# Patient Record
Sex: Female | Born: 1940 | Race: White | Hispanic: No | Marital: Married | Smoking: Never smoker
Health system: Southern US, Community
[De-identification: ages and names within clinical notes are randomized; demographics above are authoritative.]

## PROBLEM LIST (undated history)

## (undated) DIAGNOSIS — I10 Essential (primary) hypertension: Secondary | ICD-10-CM

## (undated) DIAGNOSIS — E785 Hyperlipidemia, unspecified: Secondary | ICD-10-CM

## (undated) DIAGNOSIS — L570 Actinic keratosis: Secondary | ICD-10-CM

## (undated) HISTORY — PX: RETINAL DETACHMENT SURGERY: SHX105

## (undated) HISTORY — DX: Actinic keratosis: L57.0

## (undated) HISTORY — PX: CATARACT EXTRACTION: SUR2

---

## 1997-10-06 ENCOUNTER — Other Ambulatory Visit: Admission: RE | Admit: 1997-10-06 | Discharge: 1997-10-06 | Payer: Self-pay | Admitting: Obstetrics & Gynecology

## 1998-10-07 ENCOUNTER — Other Ambulatory Visit: Admission: RE | Admit: 1998-10-07 | Discharge: 1998-10-07 | Payer: Self-pay | Admitting: Obstetrics & Gynecology

## 1999-10-08 ENCOUNTER — Other Ambulatory Visit: Admission: RE | Admit: 1999-10-08 | Discharge: 1999-10-08 | Payer: Self-pay | Admitting: Family Medicine

## 2000-10-13 ENCOUNTER — Other Ambulatory Visit: Admission: RE | Admit: 2000-10-13 | Discharge: 2000-10-13 | Payer: Self-pay | Admitting: Obstetrics & Gynecology

## 2001-10-17 ENCOUNTER — Other Ambulatory Visit: Admission: RE | Admit: 2001-10-17 | Discharge: 2001-10-17 | Payer: Self-pay | Admitting: Obstetrics & Gynecology

## 2002-10-22 ENCOUNTER — Other Ambulatory Visit: Admission: RE | Admit: 2002-10-22 | Discharge: 2002-10-22 | Payer: Self-pay | Admitting: Obstetrics & Gynecology

## 2003-10-27 ENCOUNTER — Other Ambulatory Visit: Admission: RE | Admit: 2003-10-27 | Discharge: 2003-10-27 | Payer: Self-pay | Admitting: Obstetrics & Gynecology

## 2004-11-01 ENCOUNTER — Other Ambulatory Visit: Admission: RE | Admit: 2004-11-01 | Discharge: 2004-11-01 | Payer: Self-pay | Admitting: Obstetrics & Gynecology

## 2006-01-25 ENCOUNTER — Ambulatory Visit: Payer: Self-pay | Admitting: Family Medicine

## 2006-02-02 ENCOUNTER — Ambulatory Visit: Payer: Self-pay | Admitting: Internal Medicine

## 2006-02-03 ENCOUNTER — Ambulatory Visit: Payer: Self-pay | Admitting: Family Medicine

## 2006-12-27 ENCOUNTER — Ambulatory Visit: Payer: Self-pay | Admitting: Internal Medicine

## 2007-01-10 ENCOUNTER — Ambulatory Visit: Payer: Self-pay | Admitting: Internal Medicine

## 2007-01-10 ENCOUNTER — Encounter: Payer: Self-pay | Admitting: Family Medicine

## 2007-01-10 LAB — HM COLONOSCOPY: HM Colonoscopy: NORMAL

## 2007-01-18 ENCOUNTER — Encounter: Payer: Self-pay | Admitting: Family Medicine

## 2009-03-09 ENCOUNTER — Encounter: Payer: Self-pay | Admitting: Family Medicine

## 2009-03-09 LAB — CONVERTED CEMR LAB
Pap Smear: NORMAL
Pap Smear: NORMAL
Pap Smear: NORMAL
Pap Smear: NORMAL

## 2009-03-09 LAB — HM PAP SMEAR

## 2009-03-09 LAB — HM MAMMOGRAPHY: HM Mammogram: NORMAL

## 2009-03-19 ENCOUNTER — Encounter: Payer: Self-pay | Admitting: Family Medicine

## 2009-03-19 LAB — CONVERTED CEMR LAB: Triglyceride fasting, serum: 123 mg/dL

## 2009-03-27 ENCOUNTER — Ambulatory Visit: Payer: Self-pay | Admitting: Family Medicine

## 2009-03-27 DIAGNOSIS — E7800 Pure hypercholesterolemia, unspecified: Secondary | ICD-10-CM | POA: Insufficient documentation

## 2009-03-27 DIAGNOSIS — E785 Hyperlipidemia, unspecified: Secondary | ICD-10-CM

## 2009-03-27 DIAGNOSIS — J309 Allergic rhinitis, unspecified: Secondary | ICD-10-CM

## 2009-03-27 HISTORY — DX: Hyperlipidemia, unspecified: E78.5

## 2009-06-26 ENCOUNTER — Ambulatory Visit: Payer: Self-pay | Admitting: Family Medicine

## 2009-06-29 LAB — CONVERTED CEMR LAB
ALT: 24 units/L (ref 0–35)
AST: 27 units/L (ref 0–37)
Bilirubin, Direct: 0.2 mg/dL (ref 0.0–0.3)
Calcium: 9.2 mg/dL (ref 8.4–10.5)
Cholesterol: 264 mg/dL — ABNORMAL HIGH (ref 0–200)
Glucose, Bld: 90 mg/dL (ref 70–99)
HDL: 41 mg/dL (ref >39)
Potassium: 4.2 meq/L (ref 3.5–5.3)
Sodium: 141 meq/L (ref 135–145)
Total CHOL/HDL Ratio: 6.4
Total Protein: 7.5 g/dL (ref 6.0–8.3)
Triglycerides: 95 mg/dL (ref <150)
VLDL: 19 mg/dL (ref 0–40)

## 2010-05-28 ENCOUNTER — Emergency Department: Payer: Self-pay | Admitting: Emergency Medicine

## 2010-10-29 NOTE — Assessment & Plan Note (Signed)
[facility]                             [facility] OFFICE NOTE   NAME:Patty Shelton                      MRN:          [id number]  DATE:01/25/2006                            DOB:          09-27-1940    CHIEF COMPLAINT:  A 70 year old white female here to establish a new doctor.   HISTORY OF PRESENT ILLNESS:  Patty Shelton has been very healthy over  the past few years.  She has not seen a physician for anything other than a  gynecologist in many years.  She is here today to have a physical exam and  to be set up for preventative medicine.   PAST MEDICAL HISTORY:  1. Allergic rhinitis.  2. Stress incontinence.  3. Genital herpes.   HOSPITALIZATIONS/SURGERIES/PROCEDURES:  1. Cataract surgery in 2006.  2. Retinal detachment twice, once in 1998 and once in 2006.  3. Mammogram.  Normal in May, 2007.  4. Pap smear, normal, in May, 2007.   REVIEW OF SYSTEMS:  She denies headache, dizziness, syncope.  She does wear  glasses.  She does report some mild decrease in hearing bilaterally over the  past few years, but it is not enough to bother her.  It only causes problems  in environments with lots of background noise.  She denies dyspnea,  palpitations, chest pain, wheeze.  No nausea, vomiting, diarrhea,  constipation, or rectal bleeding.  She denies nocturia, temperature  intolerance, muscle aches and pains.  She does report some chronic left hip  pain that bothers her intermittently.  Patient also notes that her  gynecologist has measured high-normal blood pressures several times in the  past few years.   FAMILY HISTORY:  Father deceased at age 60 secondary to coronary artery  disease.  Mother alive at age 77 with hypertension, cholesterol, and  coronary artery disease.  Grandparents:  Maternal grandfather with MI,  coronary artery disease, and arthritis, otherwise healthy grandparents.  No  brothers and sisters.  No family history of  breast, ovarian, uterine, or  colon cancer.  No family history of alcohol or drug abuse or depression.   SOCIAL HISTORY:  She currently works as a Investment banker, corporate.  She has been  married for 47 years.  She walks about a quarter mile daily while walking  the dog but does not get any routine exercise.  She has two children, who  are age 98 and 9, and one of them has bipolar disorder.  Her diet, she has  three meals daily and sometimes tries to have snacks at least about every  two hours, but she does not feel hunger.  She denies eating any fried foods  but does frequently eat sweets.  She does get a lot of water in, as far as  intake.  No tobacco history.  No alcohol use.  No IV drug use.   PHYSICAL EXAMINATION:  VITAL SIGNS:  Height 66-1/4.  Weight 166, making BMI  27.  Blood pressure 132/86, pulse 88, temperature 98.1.  GENERAL:  Healthy-appearing female in no apparent distress.  HEENT:  PERRLA.  No  papilledema.  No hypertensive changes in retinas.  Nares  clear.  Tympanic membranes clear.  Oropharynx clear.  NECK:  No thyromegaly.  No lymphadenopathy, cervical or supraclavicular.  LUNGS:  Clear to auscultation to light bilaterally.  No wheezes, rales, or  rhonchi.  CARDIOVASCULAR:  Regular rate and rhythm.  No murmurs, rubs or gallops.  Pulses 2+ radial.  ABDOMEN:  Soft and nontender.  Normoactive bowel sounds.  No  hepatosplenomegaly.  MUSCULOSKELETAL:  Strength is 5/5 in the upper and lower extremities.  Normal range of motion, bilateral shoulders, hips, knees, and ankles.  EXTREMITIES:  No cyanosis.  No clubbing.  NEUROLOGIC:  Alert and oriented x3.  Cranial nerves II-XII are grossly  intact.  Sensation to light touch intact in upper and lower extremities.   ASSESSMENT/PLAN:  Today we focused on prevention.  Patty Shelton will return to  clinic for lab testing to evaluate kidney function and to screen for  hypercholesterolemia, given her history of high-normal blood pressures  We   will also obtain baseline liver function tests because of possibility of  starting high cholesterol medication.  We will also screen her for diabetes.  She was scheduled for a colonoscopy and a bone density scan today.  We  discussed low salt diet, increasing fiber, and getting regular exercise.  She was also given information to read about these topics.  It is  recommended to take a multivitamin daily and get calcium and vitamin D  daily.  She will return to clinic as needed following labs.                                   Kerby Nora, MD   AB/MedQ  DD:  01/25/2006  DT:  01/25/2006  Job #:  [id number]

## 2011-02-03 ENCOUNTER — Encounter: Payer: Self-pay | Admitting: Family Medicine

## 2011-02-11 ENCOUNTER — Ambulatory Visit (INDEPENDENT_AMBULATORY_CARE_PROVIDER_SITE_OTHER): Payer: Medicare Other | Admitting: Family Medicine

## 2011-02-11 ENCOUNTER — Encounter: Payer: Self-pay | Admitting: Family Medicine

## 2011-02-11 VITALS — BP 140/82 | HR 77 | Temp 98.1°F | Ht 66.5 in | Wt 170.8 lb

## 2011-02-11 DIAGNOSIS — M79609 Pain in unspecified limb: Secondary | ICD-10-CM

## 2011-02-11 DIAGNOSIS — I10 Essential (primary) hypertension: Secondary | ICD-10-CM | POA: Insufficient documentation

## 2011-02-11 DIAGNOSIS — E7800 Pure hypercholesterolemia, unspecified: Secondary | ICD-10-CM

## 2011-02-11 DIAGNOSIS — M79604 Pain in right leg: Secondary | ICD-10-CM | POA: Insufficient documentation

## 2011-02-11 LAB — POCT URINALYSIS DIPSTICK
Bilirubin, UA: NEGATIVE
Blood, UA: NEGATIVE
Glucose, UA: NEGATIVE
Nitrite, UA: NEGATIVE
Spec Grav, UA: 1.01
pH, UA: 7

## 2011-02-11 NOTE — Assessment & Plan Note (Signed)
Due for re-eval. Likely will need chol medicaiton. She will start on low chol diet and continue exercsie.

## 2011-02-11 NOTE — Assessment & Plan Note (Addendum)
New diagnosis.  EKG: NSR, no ST changes, no Q, no LVH. UA showed no protein. Eval with labs for secondary cause, end organ damage and risk factors for CAD.  Likely will start HCTZ next appt.

## 2011-02-11 NOTE — Progress Notes (Signed)
Subjective:    Patient ID: Patty Shelton, female    DOB: 04-05-1941, 70 y.o.   MRN: [id number]  HPI  70 year old female last seen in 2010 presents for BP check and lab discussion.   She has a history of high cholesterol, not on any medication. She has not made very many lifestyle changes since last chol check in 2011. Husband had heart attack and DM diagnosis  6 weeks ago.Marland Kitchen Has started some changes since then.  Has started walking 1/2 a mile twice a day.  LDL goal is <130. Lab Results  Component Value Date   CHOL 264* 06/26/2009   CHOL 270 03/19/2009   Lab Results  Component Value Date   HDL 41 06/26/2009   HDL 49 78/07/9560   Lab Results  Component Value Date   LDLCALC 204* 06/26/2009   LDLCALC 196 03/19/2009   Lab Results  Component Value Date   TRIG 95 06/26/2009   In 2-3 days she has been having mild  left anterior leg pain, occ wakes her up at night. Ache.  Worse when lying down at night, feels better when moving. Resolves with tylenol.  She does see GYN regularly. She has uterus and ovaries...has continued pap smears and breast exam.  Last mammo  01/2010  New diagnosis of HTN: At home 140/? several times.   Review of Systems  Constitutional: Negative for fever and fatigue.  HENT: Negative for ear pain.   Eyes: Negative for pain.  Respiratory: Negative for cough, chest tightness and shortness of breath.        She has noted some mild SOB with walking.. She has recently started exercise routine.  Cardiovascular: Negative for chest pain, palpitations and leg swelling.  Gastrointestinal: Negative for abdominal pain, diarrhea, constipation and blood in stool.  Genitourinary: Negative for dysuria and difficulty urinating.       Objective:   Physical Exam  Constitutional: Vital signs are normal. She appears well-developed and well-nourished. She is cooperative.  Non-toxic appearance. She does not appear ill. No distress.  HENT:  Head: Normocephalic.  Right Ear:  Hearing, tympanic membrane, external ear and ear canal normal. Tympanic membrane is not erythematous, not retracted and not bulging.  Left Ear: Hearing, tympanic membrane, external ear and ear canal normal. Tympanic membrane is not erythematous, not retracted and not bulging.  Nose: No mucosal edema or rhinorrhea. Right sinus exhibits no maxillary sinus tenderness and no frontal sinus tenderness. Left sinus exhibits no maxillary sinus tenderness and no frontal sinus tenderness.  Mouth/Throat: Uvula is midline, oropharynx is clear and moist and mucous membranes are normal.  Eyes: Conjunctivae, EOM and lids are normal. Pupils are equal, round, and reactive to light. No foreign bodies found.  Fundoscopic exam:      The right eye shows no AV nicking, no exudate, no hemorrhage and no papilledema.       The left eye shows no AV nicking, no exudate, no hemorrhage and no papilledema.  Neck: Trachea normal and normal range of motion. Neck supple. Carotid bruit is not present. No mass and no thyromegaly present.  Cardiovascular: Normal rate, regular rhythm, S1 normal, S2 normal, normal heart sounds, intact distal pulses and normal pulses.  Exam reveals no gallop and no friction rub.   No murmur heard. Pulmonary/Chest: Effort normal and breath sounds normal. Not tachypneic. No respiratory distress. She has no decreased breath sounds. She has no wheezes. She has no rhonchi. She has no rales.  Abdominal: Soft. Normal appearance  and bowel sounds are normal. There is no tenderness.  Neurological: She is alert.  Skin: Skin is warm, dry and intact. No rash noted.  Psychiatric: Her speech is normal and behavior is normal. Judgment and thought content normal. Her mood appears not anxious. Cognition and memory are normal. She does not exhibit a depressed mood.          Assessment & Plan:

## 2011-02-11 NOTE — Patient Instructions (Addendum)
Work on healthy eating habits, increase exercise, weight loss. Increase fluids, stretch before and after exercise.  We will discuss lab results at next appointment.

## 2011-02-11 NOTE — Assessment & Plan Note (Signed)
No sign of DVT or PVD. Most likely MSK strain for recent activity change and no stretching. Will eval TSH and K. Increase fluids, stretch before and after exercise.

## 2011-03-07 ENCOUNTER — Other Ambulatory Visit (INDEPENDENT_AMBULATORY_CARE_PROVIDER_SITE_OTHER): Payer: Medicare Other

## 2011-03-07 DIAGNOSIS — I10 Essential (primary) hypertension: Secondary | ICD-10-CM

## 2011-03-07 LAB — CBC WITH DIFFERENTIAL/PLATELET
Basophils Absolute: 0 10*3/uL (ref 0.0–0.1)
Eosinophils Absolute: 0.1 10*3/uL (ref 0.0–0.7)
Eosinophils Relative: 2.6 % (ref 0.0–5.0)
HCT: 42.5 % (ref 36.0–46.0)
Lymphs Abs: 1.9 10*3/uL (ref 0.7–4.0)
MCHC: 33.5 g/dL (ref 30.0–36.0)
MCV: 88.9 fl (ref 78.0–100.0)
Monocytes Absolute: 0.4 10*3/uL (ref 0.1–1.0)
Neutrophils Relative %: 57.7 % (ref 43.0–77.0)
Platelets: 258 10*3/uL (ref 150.0–400.0)
RDW: 13.2 % (ref 11.5–14.6)

## 2011-03-07 LAB — LIPID PANEL
Cholesterol: 230 mg/dL — ABNORMAL HIGH (ref 0–200)
HDL: 42.6 mg/dL (ref >39.00)
Triglycerides: 180 mg/dL — ABNORMAL HIGH (ref 0.0–149.0)

## 2011-03-07 LAB — COMPREHENSIVE METABOLIC PANEL
Albumin: 4.1 g/dL (ref 3.5–5.2)
BUN: 19 mg/dL (ref 6–23)
Calcium: 8.7 mg/dL (ref 8.4–10.5)
Chloride: 107 mEq/L (ref 96–112)
Creatinine, Ser: 0.9 mg/dL (ref 0.4–1.2)
GFR: 64 mL/min (ref >60.00)
Glucose, Bld: 98 mg/dL (ref 70–99)
Potassium: 4.4 mEq/L (ref 3.5–5.1)

## 2011-03-11 ENCOUNTER — Ambulatory Visit (INDEPENDENT_AMBULATORY_CARE_PROVIDER_SITE_OTHER): Payer: Medicare Other | Admitting: Family Medicine

## 2011-03-11 ENCOUNTER — Encounter: Payer: Self-pay | Admitting: Family Medicine

## 2011-03-11 VITALS — BP 120/82 | HR 80 | Temp 98.4°F | Ht 66.5 in | Wt 174.4 lb

## 2011-03-11 DIAGNOSIS — Z0000 Encounter for general adult medical examination without abnormal findings: Secondary | ICD-10-CM

## 2011-03-11 DIAGNOSIS — Z780 Asymptomatic menopausal state: Secondary | ICD-10-CM

## 2011-03-11 DIAGNOSIS — Z23 Encounter for immunization: Secondary | ICD-10-CM

## 2011-03-11 DIAGNOSIS — E7800 Pure hypercholesterolemia, unspecified: Secondary | ICD-10-CM

## 2011-03-11 DIAGNOSIS — J309 Allergic rhinitis, unspecified: Secondary | ICD-10-CM

## 2011-03-11 DIAGNOSIS — R062 Wheezing: Secondary | ICD-10-CM | POA: Insufficient documentation

## 2011-03-11 DIAGNOSIS — I10 Essential (primary) hypertension: Secondary | ICD-10-CM

## 2011-03-11 NOTE — Assessment & Plan Note (Signed)
Spirometry normal. Likely due to allergies.. Pt not interested in daily allergy medication treatment.

## 2011-03-11 NOTE — Progress Notes (Signed)
Subjective:    Patient ID: Patty Shelton, female    DOB: 08-11-1940, 70 y.o.   MRN: [id number]  HPI I have personally reviewed the Medicare Annual Wellness questionnaire and have noted 1. The patient's medical and social history 2. Their use of alcohol, tobacco or illicit drugs 3. Their current medications and supplements 4. The patient's functional ability including ADL's, fall risks, home safety risks and hearing or visual             impairment. 5. Diet and physical activities 6. Evidence for depression or mood disorders The patients weight, height, BMI and visual acuity have been recorded in the chart I have made referrals, counseling and provided education to the patient based review of the above and I have provided the pt with a written personalized care plan for preventive services.  Hypertension:  Recent diagnosis.. Work up neg, labs nml, EKG and urine nml. Not yet on any med...  Using medication without problems or lightheadedness:  Chest pain with exertion: None Edema:None Short of breath:occ Average home BPs: not checked recently  Other issues:   Elevated Cholesterol: Inadequate control of triglycerides and LDL (Goal <130) Not currently on any medication. Diet compliance: moderate Exercise:daily, walking dog Other complaints:  Has always had some intermittant cough, post nasal drip, occ wheeze, rare SOB.. Ongoing lifelong. No sneeze , no itchy. Has only occ tried OTC allergy med.Marland Kitchen Helps temporarily. She does not want to take regular med. Concerns her as cause. Non smoker.  Review of Systems  Constitutional: Negative for fever and fatigue.  HENT: Negative for ear pain.   Eyes: Negative for pain.  Respiratory: Positive for shortness of breath and wheezing. Negative for chest tightness.   Cardiovascular: Negative for chest pain, palpitations and leg swelling.  Gastrointestinal: Negative for abdominal pain.  Genitourinary: Negative for dysuria.         Objective:   Physical Exam  Constitutional: Vital signs are normal. She appears well-developed and well-nourished. She is cooperative.  Non-toxic appearance. She does not appear ill. No distress.  HENT:  Head: Normocephalic.  Right Ear: Hearing, tympanic membrane, external ear and ear canal normal.  Left Ear: Hearing, tympanic membrane, external ear and ear canal normal.  Nose: Nose normal.  Eyes: Conjunctivae, EOM and lids are normal. Pupils are equal, round, and reactive to light. No foreign bodies found.  Neck: Trachea normal and normal range of motion. Neck supple. Carotid bruit is not present. No mass and no thyromegaly present.  Cardiovascular: Normal rate, regular rhythm, S1 normal, S2 normal, normal heart sounds and intact distal pulses.  Exam reveals no gallop.   No murmur heard. Pulmonary/Chest: Effort normal and breath sounds normal. No respiratory distress. She has no wheezes. She has no rhonchi. She has no rales.  Abdominal: Soft. Normal appearance and bowel sounds are normal. She exhibits no distension, no fluid wave, no abdominal bruit and no mass. There is no hepatosplenomegaly. There is no tenderness. There is no rebound, no guarding and no CVA tenderness. No hernia.  Genitourinary: Uterus normal. No breast swelling, tenderness, discharge or bleeding. Pelvic exam was performed with patient prone.       No pap indicated.. DVE every 2 years... Not performed this year.  Lymphadenopathy:    She has no cervical adenopathy.    She has no axillary adenopathy.  Neurological: She is alert. She has normal strength. No cranial nerve deficit or sensory deficit.  Skin: Skin is warm, dry and intact. No rash noted.  Psychiatric: Her speech is normal and behavior is normal. Judgment normal. Her mood appears not anxious. Cognition and memory are normal. She does not exhibit a depressed mood.          Assessment & Plan:  Annual Medicare  The patient's preventative maintenance and  recommended screening tests for an annual wellness exam were reviewed in full today. Brought up to date unless services declined.  Counselled on the importance of diet, exercise, and its role in overall health and mortality. The patient's FH and SH was reviewed, including their home life, tobacco status, and drug and alcohol status.   Tdap: Will give today. Zostavax: Will  Look into insurance coverage. Flu: Will get a drug store No pap indicated/DVE last year . No cancer in family. No vaginal bleeding. Will plan DVE every 2 year. Mammogram: Not interested...will plan every 2 years DXA: none on record.. Will schedule this year. Colon cancer screen: 12/2006, hem. only, repeat in 10 year.

## 2011-03-11 NOTE — Assessment & Plan Note (Signed)
Not interested in medication at this time.

## 2011-03-11 NOTE — Patient Instructions (Addendum)
Check BP daily over the next week.. Call us with the measurements. Continue exercise.Marland Kitchen Possible increase time, strenuousness of exercise. Silver sneakers is a great idea. Okay to move forward with this.  Call insurance about shingles vaccine coverage. Get flu vaccine at pharmacy. Consider setting up living will and health care power of attorney.  Stop front desk to speak with Shirlee Limerick.

## 2011-03-11 NOTE — Assessment & Plan Note (Signed)
Inadequate control.. Info packet on lifestyle change given. Encouraged exercise, weight loss, healthy eating habits. Recheck in 3 months.. If not at goal <130... Consider medication.

## 2011-03-22 ENCOUNTER — Encounter: Payer: Self-pay | Admitting: Family Medicine

## 2011-03-22 ENCOUNTER — Ambulatory Visit
Admission: RE | Admit: 2011-03-22 | Discharge: 2011-03-22 | Disposition: A | Discharge status: Home / Self Care | Payer: Medicare Other | Source: Ambulatory Visit | Attending: Family Medicine | Admitting: Family Medicine

## 2011-03-22 DIAGNOSIS — M8580 Other specified disorders of bone density and structure, unspecified site: Secondary | ICD-10-CM | POA: Insufficient documentation

## 2011-03-22 DIAGNOSIS — Z780 Asymptomatic menopausal state: Secondary | ICD-10-CM

## 2011-03-24 ENCOUNTER — Ambulatory Visit (INDEPENDENT_AMBULATORY_CARE_PROVIDER_SITE_OTHER): Payer: Medicare Other | Admitting: Family Medicine

## 2011-03-24 ENCOUNTER — Encounter: Payer: Self-pay | Admitting: Family Medicine

## 2011-03-24 VITALS — BP 140/72 | HR 83 | Temp 98.3°F | Ht 66.5 in | Wt 173.4 lb

## 2011-03-24 DIAGNOSIS — I10 Essential (primary) hypertension: Secondary | ICD-10-CM

## 2011-03-24 MED ORDER — LISINOPRIL-HYDROCHLOROTHIAZIDE 10-12.5 MG PO TABS: 1.0000 | ORAL_TABLET | Freq: Every day | ORAL | Status: DC

## 2011-03-24 NOTE — Patient Instructions (Addendum)
Start Lisinopril HCTZ daily... Follow BP at home.. Call if remaining above 140/90 after several days.  Get new  BP monitor with large cuff at home. Return in 7-10 days after starting BP med for kidney lab check.

## 2011-03-24 NOTE — Progress Notes (Signed)
  Subjective:    Patient ID: Patty Shelton, female    DOB: 1940/09/22, 70 y.o.   MRN: [id number]  HPI  Hypertension: Recent diagnosis.. Work up neg, labs nml, EKG and urine nml.  Not yet on any med... BPs continue to be elevated Chest pain with exertion: None  Edema:None  Short of breath:occ  Average home BPs: 143-175/ 86-94.. Cuff is regualr.. Needs large.. Very very tight at home. Other issues: trouble sleeping, occ headache, in last week now resolved reflux, diarrhea.     Review of Systems  Constitutional: Negative for fever and fatigue.  Respiratory: Negative for shortness of breath.   Cardiovascular: Negative for chest pain.  Neurological: Positive for headaches. Negative for syncope.       Objective:   Physical Exam  Constitutional: Vital signs are normal. She appears well-developed and well-nourished. She is cooperative.  Non-toxic appearance. She does not appear ill. No distress.  HENT:  Right Ear: Hearing, tympanic membrane and ear canal normal. Tympanic membrane is not erythematous, not retracted and not bulging.  Left Ear: Hearing, tympanic membrane and ear canal normal. Tympanic membrane is not erythematous, not retracted and not bulging.  Nose: No mucosal edema or rhinorrhea. Right sinus exhibits no maxillary sinus tenderness and no frontal sinus tenderness. Left sinus exhibits no maxillary sinus tenderness and no frontal sinus tenderness.  Mouth/Throat: Uvula is midline and mucous membranes are normal.  Eyes: Lids are normal. No foreign bodies found.  Neck: Trachea normal and normal range of motion. Neck supple. Carotid bruit is not present. No mass and no thyromegaly present.  Cardiovascular: Normal rate, regular rhythm, S1 normal, S2 normal, normal heart sounds, intact distal pulses and normal pulses.  Exam reveals no gallop and no friction rub.   No murmur heard. Pulmonary/Chest: Effort normal and breath sounds normal. Not tachypneic. No respiratory distress.  She has no decreased breath sounds. She has no wheezes. She has no rhonchi. She has no rales.  Abdominal: Normal appearance.  Neurological: She is alert.  Skin: Skin is warm, dry and intact.  Psychiatric: Her speech is normal. Her mood appears not anxious. Cognition and memory are normal. She does not exhibit a depressed mood.          Assessment & Plan:

## 2011-04-04 ENCOUNTER — Other Ambulatory Visit (INDEPENDENT_AMBULATORY_CARE_PROVIDER_SITE_OTHER): Payer: Medicare Other

## 2011-04-04 DIAGNOSIS — I10 Essential (primary) hypertension: Secondary | ICD-10-CM

## 2011-04-04 DIAGNOSIS — E7800 Pure hypercholesterolemia, unspecified: Secondary | ICD-10-CM

## 2011-04-04 LAB — BASIC METABOLIC PANEL
Calcium: 9 mg/dL (ref 8.4–10.5)
GFR: 58.12 mL/min — ABNORMAL LOW (ref >60.00)
Glucose, Bld: 98 mg/dL (ref 70–99)
Sodium: 141 mEq/L (ref 135–145)

## 2011-04-04 LAB — LDL CHOLESTEROL, DIRECT: Direct LDL: 180.6 mg/dL

## 2011-04-04 LAB — LIPID PANEL
HDL: 45.8 mg/dL (ref >39.00)
Triglycerides: 193 mg/dL — ABNORMAL HIGH (ref 0.0–149.0)
VLDL: 38.6 mg/dL (ref 0.0–40.0)

## 2011-04-13 ENCOUNTER — Other Ambulatory Visit: Payer: Self-pay | Admitting: *Deleted

## 2011-04-13 ENCOUNTER — Telehealth: Payer: Self-pay | Admitting: *Deleted

## 2011-04-13 MED ORDER — ATORVASTATIN CALCIUM 40 MG PO TABS: 40.0000 mg | ORAL_TABLET | Freq: Every day | ORAL | Status: DC

## 2011-04-13 NOTE — Telephone Encounter (Signed)
Forward to PCP.

## 2011-04-13 NOTE — Telephone Encounter (Signed)
Pt called for lab results  I gave them to her and she states she does want to start the Lipitor 40 mg, she will call back for labs. I sent in rx for # 30 with 3 refills.

## 2011-04-27 ENCOUNTER — Other Ambulatory Visit: Payer: Self-pay | Admitting: *Deleted

## 2011-04-27 DIAGNOSIS — A6000 Herpesviral infection of urogenital system, unspecified: Secondary | ICD-10-CM

## 2011-04-27 NOTE — Telephone Encounter (Signed)
Phoned request from pt, she has previously gotten this from Dr. Arlyce Dice.

## 2011-04-28 DIAGNOSIS — A6000 Herpesviral infection of urogenital system, unspecified: Secondary | ICD-10-CM | POA: Insufficient documentation

## 2011-04-28 MED ORDER — FAMCICLOVIR 250 MG PO TABS: 250.0000 mg | ORAL_TABLET | Freq: Every day | ORAL | Status: DC

## 2011-04-28 NOTE — Telephone Encounter (Signed)
Let pt know .Marland Kitchen Med sent to pharm. Please enter genital herpes in problem list.

## 2011-04-28 NOTE — Telephone Encounter (Signed)
Patient is taken is taking this for herpes genital

## 2011-04-28 NOTE — Telephone Encounter (Signed)
What is she taking this for?

## 2011-05-03 ENCOUNTER — Encounter: Payer: Self-pay | Admitting: Family Medicine

## 2011-05-03 ENCOUNTER — Ambulatory Visit (INDEPENDENT_AMBULATORY_CARE_PROVIDER_SITE_OTHER): Payer: Medicare Other | Admitting: Family Medicine

## 2011-05-03 VITALS — BP 120/62 | HR 99 | Temp 98.4°F | Ht 66.5 in | Wt 173.4 lb

## 2011-05-03 DIAGNOSIS — J209 Acute bronchitis, unspecified: Secondary | ICD-10-CM

## 2011-05-03 MED ORDER — HYDROCODONE-HOMATROPINE 5-1.5 MG/5ML PO SYRP: ORAL_SOLUTION | ORAL | Status: AC

## 2011-05-03 MED ORDER — AZITHROMYCIN 250 MG PO TABS: ORAL_TABLET | ORAL | Status: DC

## 2011-05-03 NOTE — Progress Notes (Signed)
  Patient Name: Patty Shelton Date of Birth: January 11, 1941 Age: 70 y.o. Medical Record Number: [id number] Gender: female  History of Present Illness:  Patty Shelton is a 70 y.o. very pleasant female patient who presents with the following:  Really bad cough for about a week. No fever, does not feel bad. Mucous in her throat. Not much in nose - some post-naasal. Mild earache.  No n/v/d. Some mucinex. Coughing bad at night, prod green sputum No tob or lung pathology history  Past Medical History, Surgical History, Social History, Family History, and Problem List have been reviewed in EHR and updated if relevant.  Review of Systems: ROS: GEN: Acute illness details above GI: Tolerating PO intake GU: maintaining adequate hydration and urination Pulm: No SOB Interactive and getting along well at home.  Otherwise, ROS is as per the HPI.   Physical Examination: Filed Vitals:   05/03/11 0758  BP: 120/62  Pulse: 99  Temp: 98.4 F (36.9 C)  TempSrc: Oral  Height: 5' 6.5" (1.689 m)  Weight: 173 lb 6.4 oz (78.654 kg)  SpO2: 97%     GEN: A and O x 3. WDWN. NAD.    ENT: Nose clear, ext NML.  No LAD.  No JVD.  TM's clear. Oropharynx clear.  PULM: Normal WOB, no distress. No crackles, wheezes, rhonchi. CV: RRR, no M/G/R, No rubs, No JVD.   EXT: warm and well-perfused, No c/c/e. PSYCH: Pleasant and conversant.   Assessment and Plan: Acute bronchitis: discussed plan of care. Given length of symptoms and overall history, will give ABX in this case to hold -- viral vs bact bronchitis. If clears, no need to fill. Continue with additional supportive care, cough medications, liquids, sleep, steam / vaporizer.

## 2011-05-16 ENCOUNTER — Telehealth: Payer: Self-pay | Admitting: Internal Medicine

## 2011-05-16 NOTE — Telephone Encounter (Signed)
Patient called and stated that she thinks she has a UTI she going every , frequent urination and pressure.  Nobody has an opening until Wednesday and patient stated she couldn't wait that long.  Please advise.

## 2011-05-17 ENCOUNTER — Telehealth: Payer: Self-pay | Admitting: Internal Medicine

## 2011-05-17 NOTE — Telephone Encounter (Signed)
Left message for patient to return my call.

## 2011-05-17 NOTE — Telephone Encounter (Signed)
Tried to reach patient and left message

## 2011-05-17 NOTE — Telephone Encounter (Signed)
i am happy to see the patient today

## 2011-05-18 NOTE — Telephone Encounter (Signed)
Patient never return my calls to make appt

## 2011-05-31 ENCOUNTER — Telehealth: Payer: Self-pay | Admitting: Family Medicine

## 2011-05-31 DIAGNOSIS — I10 Essential (primary) hypertension: Secondary | ICD-10-CM

## 2011-05-31 DIAGNOSIS — E7800 Pure hypercholesterolemia, unspecified: Secondary | ICD-10-CM

## 2011-05-31 DIAGNOSIS — M8580 Other specified disorders of bone density and structure, unspecified site: Secondary | ICD-10-CM

## 2011-05-31 NOTE — Telephone Encounter (Signed)
Pt had chol labs in 10/22.. Not due yet for 3 month recehck... No labs due. Cancel lab appt unless pt has other concern.

## 2011-05-31 NOTE — Telephone Encounter (Signed)
Message copied by Excell Seltzer on Tue May 31, 2011 10:26 AM ------      Message from: Liane Comber C      Created: Mon May 30, 2011  1:34 PM      Regarding: Cpx labs Fri 06/10/11       Please order  future cpx labs for pt's upcomming lab appt.      Thanks      Rodney Booze

## 2011-06-10 ENCOUNTER — Other Ambulatory Visit: Payer: Medicare Other

## 2011-06-16 DIAGNOSIS — H26499 Other secondary cataract, unspecified eye: Secondary | ICD-10-CM | POA: Diagnosis not present

## 2011-06-22 NOTE — Telephone Encounter (Signed)
Opened in error by Pam Clement 

## 2011-07-05 ENCOUNTER — Other Ambulatory Visit: Payer: Self-pay | Admitting: Family Medicine

## 2011-07-05 DIAGNOSIS — E7800 Pure hypercholesterolemia, unspecified: Secondary | ICD-10-CM

## 2011-07-08 ENCOUNTER — Encounter: Payer: Self-pay | Admitting: Family Medicine

## 2011-07-08 ENCOUNTER — Ambulatory Visit (INDEPENDENT_AMBULATORY_CARE_PROVIDER_SITE_OTHER): Payer: Medicare Other | Admitting: Family Medicine

## 2011-07-08 VITALS — BP 120/62 | HR 76 | Temp 98.6°F | Ht 66.5 in | Wt 174.0 lb

## 2011-07-08 DIAGNOSIS — R3915 Urgency of urination: Secondary | ICD-10-CM

## 2011-07-08 DIAGNOSIS — R300 Dysuria: Secondary | ICD-10-CM

## 2011-07-08 LAB — POCT URINALYSIS DIPSTICK
Glucose, UA: NEGATIVE
Nitrite, UA: NEGATIVE
Protein, UA: NEGATIVE
Spec Grav, UA: 1.01
Urobilinogen, UA: NEGATIVE

## 2011-07-08 NOTE — Patient Instructions (Signed)
Call if urinary urgency recurs or becomes bothersome.. We can consider medication like toviaz to treat. In meantime, avoid bladder irritants and push water.

## 2011-07-08 NOTE — Progress Notes (Signed)
  Subjective:    Patient ID: Patty Shelton, female    DOB: Sep 12, 1940, 71 y.o.   MRN: [id number]  HPI  71 year old female with recent possible UTI 1 month ago presents with recurrent symptoms. Had frequency and had trouble holding urine, incontinence, also urgency. No dysuria, no blood in urine. She increased water, took cranberry and cystex, but never saw MD and never took.  Symptoms went away, until 1 week ago...returned with urinary frequency.   No fever, no CVA tenderness, no abdominal pain, no flank pain.   Review of Systems  Constitutional: Negative for fatigue.  Respiratory: Negative for cough and shortness of breath.   Cardiovascular: Negative for chest pain.       Objective:   Physical Exam  Constitutional: She appears well-developed and well-nourished.  Cardiovascular: Normal rate, regular rhythm, normal heart sounds and intact distal pulses.  Exam reveals no gallop and no friction rub.   No murmur heard. Abdominal: Soft. Bowel sounds are normal. She exhibits no distension and no mass. There is no hepatosplenomegaly. There is no tenderness. There is no rebound, no guarding and no CVA tenderness.  Skin: Skin is warm and dry. No rash noted.          Assessment & Plan:

## 2011-07-08 NOTE — Assessment & Plan Note (Signed)
No blood in urine, no sign of DM, no protein in urine.  No sign of infection. Most likely due to irritants and/or bladder spasm.  Marthe Patch, but she will hold off given SE and symptoms resolved at this point.

## 2011-07-11 ENCOUNTER — Other Ambulatory Visit (INDEPENDENT_AMBULATORY_CARE_PROVIDER_SITE_OTHER): Payer: Medicare Other

## 2011-07-11 DIAGNOSIS — E7800 Pure hypercholesterolemia, unspecified: Secondary | ICD-10-CM | POA: Diagnosis not present

## 2011-07-11 LAB — COMPREHENSIVE METABOLIC PANEL
ALT: 25 U/L (ref 0–35)
AST: 23 U/L (ref 0–37)
Albumin: 4.2 g/dL (ref 3.5–5.2)
BUN: 23 mg/dL (ref 6–23)
CO2: 27 mEq/L (ref 19–32)
Calcium: 9.1 mg/dL (ref 8.4–10.5)
Chloride: 106 mEq/L (ref 96–112)
Creatinine, Ser: 1.1 mg/dL (ref 0.4–1.2)
GFR: 52.57 mL/min — ABNORMAL LOW (ref >60.00)
Potassium: 4.1 mEq/L (ref 3.5–5.1)

## 2011-07-11 LAB — LIPID PANEL
Cholesterol: 161 mg/dL (ref 0–200)
LDL Cholesterol: 90 mg/dL (ref 0–99)
Triglycerides: 120 mg/dL (ref 0.0–149.0)

## 2011-08-01 DIAGNOSIS — L820 Inflamed seborrheic keratosis: Secondary | ICD-10-CM | POA: Diagnosis not present

## 2011-08-01 DIAGNOSIS — D1800 Hemangioma unspecified site: Secondary | ICD-10-CM | POA: Diagnosis not present

## 2011-08-01 DIAGNOSIS — L821 Other seborrheic keratosis: Secondary | ICD-10-CM | POA: Diagnosis not present

## 2011-08-10 ENCOUNTER — Other Ambulatory Visit: Payer: Self-pay | Admitting: *Deleted

## 2011-08-10 MED ORDER — ATORVASTATIN CALCIUM 40 MG PO TABS: 40.0000 mg | ORAL_TABLET | Freq: Every day | ORAL | Status: DC

## 2012-01-10 ENCOUNTER — Other Ambulatory Visit (INDEPENDENT_AMBULATORY_CARE_PROVIDER_SITE_OTHER): Payer: Medicare Other

## 2012-01-10 ENCOUNTER — Telehealth: Payer: Self-pay | Admitting: Family Medicine

## 2012-01-10 DIAGNOSIS — E7800 Pure hypercholesterolemia, unspecified: Secondary | ICD-10-CM | POA: Diagnosis not present

## 2012-01-10 DIAGNOSIS — M8580 Other specified disorders of bone density and structure, unspecified site: Secondary | ICD-10-CM

## 2012-01-10 DIAGNOSIS — M899 Disorder of bone, unspecified: Secondary | ICD-10-CM | POA: Diagnosis not present

## 2012-01-10 LAB — COMPREHENSIVE METABOLIC PANEL
CO2: 32 mEq/L (ref 19–32)
Chloride: 103 mEq/L (ref 96–112)
Glucose, Bld: 99 mg/dL (ref 70–99)
Potassium: 4.3 mEq/L (ref 3.5–5.1)
Sodium: 141 mEq/L (ref 135–145)

## 2012-01-10 LAB — LIPID PANEL
Cholesterol: 158 mg/dL (ref 0–200)
LDL Cholesterol: 86 mg/dL (ref 0–99)
Total CHOL/HDL Ratio: 3

## 2012-01-10 NOTE — Telephone Encounter (Signed)
Message copied by Excell Seltzer on Tue Jan 10, 2012  8:11 AM ------      Message from: Alvina Chou      Created: Wed Jan 04, 2012  4:33 PM      Regarding: Labs for Tuesday, 7.30.13       Patient wants lab, scheduled for 7.30.13, no follow up appt scheduled, Thanks, T

## 2012-01-12 ENCOUNTER — Encounter: Payer: Self-pay | Admitting: *Deleted

## 2012-01-16 ENCOUNTER — Ambulatory Visit (INDEPENDENT_AMBULATORY_CARE_PROVIDER_SITE_OTHER): Payer: Medicare Other | Admitting: Family Medicine

## 2012-01-16 ENCOUNTER — Encounter: Payer: Self-pay | Admitting: Family Medicine

## 2012-01-16 VITALS — BP 108/60 | HR 73 | Temp 98.9°F | Wt 169.0 lb

## 2012-01-16 DIAGNOSIS — W57XXXA Bitten or stung by nonvenomous insect and other nonvenomous arthropods, initial encounter: Secondary | ICD-10-CM | POA: Diagnosis not present

## 2012-01-16 DIAGNOSIS — L03119 Cellulitis of unspecified part of limb: Secondary | ICD-10-CM | POA: Diagnosis not present

## 2012-01-16 DIAGNOSIS — T148XXA Other injury of unspecified body region, initial encounter: Secondary | ICD-10-CM

## 2012-01-16 DIAGNOSIS — L02619 Cutaneous abscess of unspecified foot: Secondary | ICD-10-CM

## 2012-01-16 DIAGNOSIS — T148 Other injury of unspecified body region: Secondary | ICD-10-CM

## 2012-01-16 MED ORDER — CEPHALEXIN 500 MG PO CAPS: 1000.0000 mg | ORAL_CAPSULE | Freq: Two times a day (BID) | ORAL | Status: AC

## 2012-01-16 NOTE — Progress Notes (Signed)
Nature conservation officer at [facility] [address] [place] [address] Phone: [phone] Fax: [phone]  Date:  01/16/2012   Name:  Patty Shelton   DOB:  04/24/1941   MRN:  [id number]  PCP:  Kerby Nora, MD    Chief Complaint: Insect Bite   History of Present Illness:  Patty Shelton is a 71 y.o. very pleasant female patient who presents with the following:  R foot, pinprick on the bottom of her foot.  This has been getting worse comments mostly on the medial aspect, and she has some redness and warmth on the bottom of her foot. There is no pus. It has become slightly darker in appearance and a couple of areas. There are no apparent puncture wounds. She did not see any particular insect or anything at all, but she did feel a slight prick.  Past Medical History, Surgical History, Social History, Family History, Problem List, Medications, and Allergies have been reviewed and updated if relevant.  Current Outpatient Prescriptions on File Prior to Visit  Medication Sig Dispense Refill  . atorvastatin (LIPITOR) 40 MG tablet Take 1 tablet (40 mg total) by mouth daily.  30 tablet  12  . Biotin 10 MG TABS Take 1 tablet by mouth daily.      . Calcium Carbonate-Vitamin D (CALCIUM 600+D) 600-400 MG-UNIT per tablet Take 1 tablet by mouth daily.      . cetirizine (ZYRTEC) 10 MG tablet Take 10 mg by mouth as needed.        . famciclovir (FAMVIR) 250 MG tablet Take 1 tablet (250 mg total) by mouth daily.  90 tablet  3  . lisinopril-hydrochlorothiazide (PRINZIDE,ZESTORETIC) 10-12.5 MG per tablet Take 1 tablet by mouth daily.  30 tablet  11    Review of Systems:  GEN: No acute illnesses, no fevers, chills. GI: No n/v/d, eating normally Pulm: No SOB Interactive and getting along well at home.  Otherwise, ROS is as per the HPI.   Physical Examination: Filed Vitals:   01/16/12 1052  BP: 108/60  Pulse: 73  Temp: 98.9 F (37.2 C)   Filed Vitals:   01/16/12 1052  Weight: 169  lb (76.658 kg)   There is no height on file to calculate BMI. Ideal Body Weight:     GEN: WDWN, NAD, Non-toxic, Alert & Oriented x 3 HEENT: Atraumatic, Normocephalic.  Ears and Nose: No external deformity. EXTR: No clubbing/cyanosis/edema NEURO: Normal gait.  PSYCH: Normally interactive. Conversant. Not depressed or anxious appearing.  Calm demeanor.  SKIN: Right medial aspect of the medial calcaneus with redness and some warmth. There is no area of induration. There is some purplish discoloration in appearance and a couple of spots.  Assessment and Plan:  1. Cellulitis and abscess of foot excluding toe   2. Insect bite    Exact etiology is unclear. I am not sure if this is a poison reaction secondary to a insect bite or spider bite, but I do not see any bite wound entry point. He seems most consistent with some early cellulitis given the warmth or redness, so ago treat her with some Keflex  Orders Today:  No orders of the defined types were placed in this encounter.    Medications Today: (Includes new updates added during medication reconciliation) Meds ordered this encounter  Medications  . cephALEXin (KEFLEX) 500 MG capsule    Sig: Take 2 capsules (1,000 mg total) by mouth 2 (two) times daily.    Dispense:  40 capsule    Refill:  0     Hannah Beat, MD

## 2012-04-09 ENCOUNTER — Other Ambulatory Visit: Payer: Self-pay | Admitting: *Deleted

## 2012-04-09 MED ORDER — LISINOPRIL-HYDROCHLOROTHIAZIDE 10-12.5 MG PO TABS: 1.0000 | ORAL_TABLET | Freq: Every day | ORAL | Status: DC

## 2012-04-17 ENCOUNTER — Other Ambulatory Visit: Payer: Self-pay | Admitting: *Deleted

## 2012-04-17 MED ORDER — FAMCICLOVIR 250 MG PO TABS: 250.0000 mg | ORAL_TABLET | Freq: Every day | ORAL | Status: DC

## 2012-04-17 NOTE — Telephone Encounter (Signed)
Pt requesting refills for lisinopril and famvir. I already filled lisinopril on 10/28, is it ok to fill famvir? Pt uses [facility]

## 2012-08-16 ENCOUNTER — Other Ambulatory Visit: Payer: Self-pay | Admitting: *Deleted

## 2012-08-16 MED ORDER — ATORVASTATIN CALCIUM 40 MG PO TABS: 40.0000 mg | ORAL_TABLET | Freq: Every day | ORAL | Status: DC

## 2012-10-29 ENCOUNTER — Ambulatory Visit (INDEPENDENT_AMBULATORY_CARE_PROVIDER_SITE_OTHER): Payer: Medicare Other | Admitting: Family Medicine

## 2012-10-29 ENCOUNTER — Encounter: Payer: Self-pay | Admitting: Family Medicine

## 2012-10-29 VITALS — BP 120/72 | HR 81 | Temp 98.6°F | Ht 66.5 in | Wt 177.8 lb

## 2012-10-29 DIAGNOSIS — M25559 Pain in unspecified hip: Secondary | ICD-10-CM

## 2012-10-29 DIAGNOSIS — M7061 Trochanteric bursitis, right hip: Secondary | ICD-10-CM

## 2012-10-29 DIAGNOSIS — M76899 Other specified enthesopathies of unspecified lower limb, excluding foot: Secondary | ICD-10-CM | POA: Diagnosis not present

## 2012-10-29 DIAGNOSIS — M25551 Pain in right hip: Secondary | ICD-10-CM

## 2012-10-29 NOTE — Progress Notes (Signed)
Nature conservation officer at [facility] [address] [place] [address] Phone: [phone] Fax: [phone]  Date:  10/29/2012   Name:  Patty Shelton   DOB:  1941-04-01   MRN:  [id number] Gender: female Age: 72 y.o.  Primary Physician:  Kerby Nora, MD  Evaluating MD: Hannah Beat, MD   Chief Complaint: Hip Pain   History of Present Illness:  Patty Shelton is a 72 y.o. pleasant patient who presents with the following:  30 years ago had some excruciating pain, then went to the ER and had a couple of episodes with it. Sometimes goes away - now up to a month.  R posterior and lateral. Pain, now has had symptoms > 1 month. She recently has been doing a lot more walking. No groin pain. No back pain. No numbness or radiculopathy. Some occ pain down lateral aspect of hip. Most pain lateral and some in posterior buttocks / pelvis region.  Patient Active Problem List   Diagnosis Date Noted  . Urinary urgency 07/08/2011  . Herpes genitalia 04/28/2011  . Osteopenia 03/22/2011  . Wheeze 03/11/2011  . Hypertension 02/11/2011  . HYPERCHOLESTEROLEMIA 03/27/2009  . ALLERGIC RHINITIS CAUSE UNSPECIFIED 03/27/2009    No past medical history on file.  No past surgical history on file.  History   Social History  . Marital Status: Married    Spouse Name: N/A    Number of Children: N/A  . Years of Education: N/A   Occupational History  . Not on file.   Social History Main Topics  . Smoking status: Never Smoker   . Smokeless tobacco: Never Used  . Alcohol Use: Not on file  . Drug Use: Not on file  . Sexually Active: Not on file   Other Topics Concern  . Not on file   Social History Narrative   No living will , no HCPOA.    Family History  Problem Relation Age of Onset  . Heart attack Mother   . Stroke Father   . Hyperlipidemia Father   . Dementia Father     No Known Allergies  Medication list has been reviewed and updated.  Outpatient Prescriptions  Prior to Visit  Medication Sig Dispense Refill  . atorvastatin (LIPITOR) 40 MG tablet Take 1 tablet (40 mg total) by mouth daily.  30 tablet  6  . Biotin 10 MG TABS Take 1 tablet by mouth daily.      . Calcium Carbonate-Vitamin D (CALCIUM 600+D) 600-400 MG-UNIT per tablet Take 1 tablet by mouth daily.      . cetirizine (ZYRTEC) 10 MG tablet Take 10 mg by mouth as needed.        . famciclovir (FAMVIR) 250 MG tablet Take 1 tablet (250 mg total) by mouth daily.  90 tablet  3  . lisinopril-hydrochlorothiazide (PRINZIDE,ZESTORETIC) 10-12.5 MG per tablet Take 1 tablet by mouth daily.  30 tablet  6   No facility-administered medications prior to visit.    Review of Systems:   GEN: No fevers, chills. Nontoxic. Primarily MSK c/o today. MSK: Detailed in the HPI GI: tolerating PO intake without difficulty Neuro: No numbness, parasthesias, or tingling associated. Otherwise the pertinent positives of the ROS are noted above.    Physical Examination: BP 120/72  Pulse 81  Temp(Src) 98.6 F (37 C) (Oral)  Ht 5' 6.5" (1.689 m)  Wt 177 lb 12 oz (80.627 kg)  BMI 28.26 kg/m2  SpO2 97%  Ideal Body Weight: Weight in (lb)  to have BMI = 25: 156.9   GEN: WDWN, NAD, Non-toxic, Alert & Oriented x 3 HEENT: Atraumatic, Normocephalic.  Ears and Nose: No external deformity. EXTR: No clubbing/cyanosis/edema NEURO: Normal gait.  PSYCH: Normally interactive. Conversant. Not depressed or anxious appearing.  Calm demeanor.   HIP EXAM: SIDE: b ROM: Abduction, Flexion, Internal and External range of motion: full Pain with terminal IROM and EROM: no GTB: TTP R >> L Some tenderness with deep palpation of posterior lateral pelvic rim SLR: NEG Knees: No effusion FABER: NT REVERSE FABER: NT, neg Piriformis: NT at direct palpation Str: flexion: 5/5 abduction: 4/5 on R, 4+/5 on L adduction: 5/5 Strength testing non-tender     Assessment and Plan: Right hip pain  Trochanteric bursitis of right hip     >25 minutes spent in face to face time with patient, >50% spent in counselling or coordination of care: reviewed anatomy. Do not think intraarticular. Likely overuse, GTB and some mild glute medius tendinopathy. [facility] rehab program reviewed.  F/u prn  Trochanteric Bursitis Injection, R Verbal consent obtained. Risks (including infection, potential atrophy), benefits, and alternatives reviewed. Greater trochanter sterilely prepped with Chloraprep. Ethyl Chloride used for anesthesia. 8 cc of Lidocaine 1% injected with 2 cc of 40 mg Depo-Medrol into trochanteric bursa at area of maximal tenderness at greater trochanter. Needle taken to bone to troch bursa, flows easily. Bursa massaged. No bleeding and no complications. Decreased pain after injection. Needle: 22 gauge 1 1/2   Signed, Graciano Batson T. Brennah Quraishi, MD 10/29/2012 3:16 PM

## 2012-11-19 ENCOUNTER — Ambulatory Visit (INDEPENDENT_AMBULATORY_CARE_PROVIDER_SITE_OTHER)
Admission: RE | Admit: 2012-11-19 | Discharge: 2012-11-19 | Disposition: A | Discharge status: Home / Self Care | Payer: Medicare Other | Source: Ambulatory Visit | Attending: Family Medicine | Admitting: Family Medicine

## 2012-11-19 ENCOUNTER — Ambulatory Visit (INDEPENDENT_AMBULATORY_CARE_PROVIDER_SITE_OTHER): Payer: Medicare Other | Admitting: Family Medicine

## 2012-11-19 ENCOUNTER — Encounter: Payer: Self-pay | Admitting: Family Medicine

## 2012-11-19 VITALS — BP 120/70 | HR 102 | Temp 98.0°F | Ht 66.5 in | Wt 174.8 lb

## 2012-11-19 DIAGNOSIS — M25559 Pain in unspecified hip: Secondary | ICD-10-CM

## 2012-11-19 DIAGNOSIS — M7061 Trochanteric bursitis, right hip: Secondary | ICD-10-CM

## 2012-11-19 DIAGNOSIS — M25551 Pain in right hip: Secondary | ICD-10-CM

## 2012-11-19 DIAGNOSIS — M549 Dorsalgia, unspecified: Secondary | ICD-10-CM

## 2012-11-19 DIAGNOSIS — M169 Osteoarthritis of hip, unspecified: Secondary | ICD-10-CM | POA: Diagnosis not present

## 2012-11-19 DIAGNOSIS — M76899 Other specified enthesopathies of unspecified lower limb, excluding foot: Secondary | ICD-10-CM | POA: Diagnosis not present

## 2012-11-19 NOTE — Patient Instructions (Addendum)
REFERRAL: GO THE THE FRONT ROOM AT THE ENTRANCE OF OUR CLINIC, NEAR CHECK IN. ASK FOR MARION. SHE WILL HELP YOU SET UP YOUR REFERRAL. DATE: TIME:  

## 2012-11-19 NOTE — Progress Notes (Signed)
Nature conservation officer at [facility] [address] [place] [address] Phone: [phone] Fax: [phone]  Date:  11/19/2012   Name:  Patty Shelton   DOB:  15-Apr-1941   MRN:  [id number] Gender: female Age: 72 y.o.  Primary Physician:  Kerby Nora, MD  Evaluating MD: Hannah Beat, MD   Chief Complaint: Hip Pain   History of Present Illness:  Patty Shelton is a 72 y.o. pleasant patient who presents with the following:  Last week got a cold and did not do much exercise. Had some urinary spasm. Felt fine almost 3 weeks. Hit or miss doing [facility] rehab.  Now lateral R hip is again hurting at the R lateral side.  10/29/2012 OV 30 years ago had some excruciating pain, then went to the ER and had a couple of episodes with it. Sometimes goes away - now up to a month.   R posterior and lateral. Pain, now has had symptoms > 1 month. She recently has been doing a lot more walking. No groin pain. No back pain. No numbness or radiculopathy. Some occ pain down lateral aspect of hip. Most pain lateral and some in posterior buttocks / pelvis region.   Patient Active Problem List   Diagnosis Date Noted  . Urinary urgency 07/08/2011  . Herpes genitalia 04/28/2011  . Osteopenia 03/22/2011  . Wheeze 03/11/2011  . Hypertension 02/11/2011  . HYPERCHOLESTEROLEMIA 03/27/2009  . ALLERGIC RHINITIS CAUSE UNSPECIFIED 03/27/2009    No past medical history on file.  No past surgical history on file.  History   Social History  . Marital Status: Married    Spouse Name: N/A    Number of Children: N/A  . Years of Education: N/A   Occupational History  . Not on file.   Social History Main Topics  . Smoking status: Never Smoker   . Smokeless tobacco: Never Used  . Alcohol Use: Not on file  . Drug Use: Not on file  . Sexually Active: Not on file   Other Topics Concern  . Not on file   Social History Narrative   No living will , no HCPOA.    Family History  Problem Relation  Age of Onset  . Heart attack Mother   . Stroke Father   . Hyperlipidemia Father   . Dementia Father     No Known Allergies  Medication list has been reviewed and updated.  Outpatient Prescriptions Prior to Visit  Medication Sig Dispense Refill  . atorvastatin (LIPITOR) 40 MG tablet Take 1 tablet (40 mg total) by mouth daily.  30 tablet  6  . Biotin 10 MG TABS Take 1 tablet by mouth daily.      . Calcium Carbonate-Vitamin D (CALCIUM 600+D) 600-400 MG-UNIT per tablet Take 1 tablet by mouth daily.      . cetirizine (ZYRTEC) 10 MG tablet Take 10 mg by mouth as needed.        . famciclovir (FAMVIR) 250 MG tablet Take 1 tablet (250 mg total) by mouth daily.  90 tablet  3  . lisinopril-hydrochlorothiazide (PRINZIDE,ZESTORETIC) 10-12.5 MG per tablet Take 1 tablet by mouth daily.  30 tablet  6  . naproxen sodium (ANAPROX) 220 MG tablet Take 220 mg by mouth 2 (two) times daily with a meal.       No facility-administered medications prior to visit.    Review of Systems:   GEN: No fevers, chills. Nontoxic. Primarily MSK c/o today. MSK: Detailed in the HPI  GI: tolerating PO intake without difficulty Neuro: No numbness, parasthesias, or tingling associated. Otherwise the pertinent positives of the ROS are noted above.    Physical Examination: BP 120/70  Pulse 102  Temp(Src) 98 F (36.7 C) (Oral)  Ht 5' 6.5" (1.689 m)  Wt 174 lb 12.8 oz (79.289 kg)  BMI 27.79 kg/m2  SpO2 96%  Ideal Body Weight: Weight in (lb) to have BMI = 25: 156.9   GEN: WDWN, NAD, Non-toxic, Alert & Oriented x 3 HEENT: Atraumatic, Normocephalic.  Ears and Nose: No external deformity. EXTR: No clubbing/cyanosis/edema NEURO: Normal gait.  PSYCH: Normally interactive. Conversant. Not depressed or anxious appearing.  Calm demeanor.   HIP EXAM: SIDE: r ROM: Abduction, Flexion, Internal and External range of motion: full Pain with terminal IROM and EROM: no GTB: R markedly tender SLR: NEG Knees: No  effusion FABER: NT REVERSE FABER: NT, neg Piriformis: NT at direct palpation Str: flexion: 5/5 abduction: 4+/5 adduction: 5/5 Strength testing non-tender   Dg Lumbar Spine Complete  11/19/2012   *RADIOLOGY REPORT*  Clinical Data: Hip and back pain  LUMBAR SPINE - COMPLETE 4+ VIEW  Comparison: None.  Findings: Normal alignment.  Negative for fracture.  Small anterior endplate spurs at all lumbar levels.  No other significant osseous degenerative change.  No pars defect. Coarse left upper quadrant calcifications.  IMPRESSION:  1.  No fracture or other acute bone abnormality. 2. Nonspecific left upper quadrant abdominal calcifications.   Original Report Authenticated By: D. Andria Rhein, MD   Dg Hip Complete Right  11/19/2012   *RADIOLOGY REPORT*  Clinical Data: Hip pain  RIGHT HIP - COMPLETE 2+ VIEW  Comparison: None  Findings:  There is mild osteoarthritis involving both hips. No acute fracture or subluxation identified.  No radiopaque foreign bodies or soft tissue calcifications.  IMPRESSION:  1.  Mild bilateral hip osteoarthritis.   Original Report Authenticated By: Signa Kell, M.D.    Assessment and Plan:  Hip pain, right - Plan: DG Hip Complete Right, DG Lumbar Spine Complete, Ambulatory referral to Physical Therapy  Back pain - Plan: DG Hip Complete Right, DG Lumbar Spine Complete, Ambulatory referral to Physical Therapy  Trochanteric bursitis, right  Recurrent R troch bursitis. Formal PT to help with rehab and inject GTB.  Trochanteric Bursitis Injection, RIGHT Verbal consent obtained. Risks (including infection, potential atrophy), benefits, and alternatives reviewed. Greater trochanter sterilely prepped with Chloraprep. Ethyl Chloride used for anesthesia. 8 cc of Lidocaine 1% injected with 2 cc of 40 mg Depo-Medrol into trochanteric bursa at area of maximal tenderness at greater trochanter. Needle taken to bone to troch bursa, flows easily. Bursa massaged. No bleeding and no  complications. Decreased pain after injection. Needle: 22 gauge spinal needle   Orders Today:  Orders Placed This Encounter  Procedures  . DG Hip Complete Right    Standing Status: Future     Number of Occurrences: 1     Standing Expiration Date: 01/19/2014    Order Specific Question:  Preferred imaging location?    Answer:  [facility]    Order Specific Question:  Reason for exam:    Answer:  hip pain right  . DG Lumbar Spine Complete    Standing Status: Future     Number of Occurrences: 1     Standing Expiration Date: 01/19/2014    Order Specific Question:  Preferred imaging location?    Answer:  [facility]    Order Specific Question:  Reason for exam:  Answer:  hip pain, back pain  . Ambulatory referral to Physical Therapy    Referral Priority:  Routine    Referral Type:  Physical Medicine    Referral Reason:  Specialty Services Required    Requested Specialty:  Physical Therapy    Number of Visits Requested:  1    Updated Medication List: (Includes new medications, updates to list, dose adjustments) No orders of the defined types were placed in this encounter.    Medications Discontinued: There are no discontinued medications.    Signed, Elpidio Galea. Tiquan Bouch, MD 11/19/2012 4:03 PM

## 2012-11-21 DIAGNOSIS — M6281 Muscle weakness (generalized): Secondary | ICD-10-CM | POA: Diagnosis not present

## 2012-11-21 DIAGNOSIS — M25659 Stiffness of unspecified hip, not elsewhere classified: Secondary | ICD-10-CM | POA: Diagnosis not present

## 2012-11-21 DIAGNOSIS — M25559 Pain in unspecified hip: Secondary | ICD-10-CM | POA: Diagnosis not present

## 2012-11-23 DIAGNOSIS — M6281 Muscle weakness (generalized): Secondary | ICD-10-CM | POA: Diagnosis not present

## 2012-11-23 DIAGNOSIS — M25659 Stiffness of unspecified hip, not elsewhere classified: Secondary | ICD-10-CM | POA: Diagnosis not present

## 2012-11-23 DIAGNOSIS — M25559 Pain in unspecified hip: Secondary | ICD-10-CM | POA: Diagnosis not present

## 2012-11-28 DIAGNOSIS — M25659 Stiffness of unspecified hip, not elsewhere classified: Secondary | ICD-10-CM | POA: Diagnosis not present

## 2012-11-28 DIAGNOSIS — M6281 Muscle weakness (generalized): Secondary | ICD-10-CM | POA: Diagnosis not present

## 2012-11-28 DIAGNOSIS — M25559 Pain in unspecified hip: Secondary | ICD-10-CM | POA: Diagnosis not present

## 2012-11-30 DIAGNOSIS — M6281 Muscle weakness (generalized): Secondary | ICD-10-CM | POA: Diagnosis not present

## 2012-11-30 DIAGNOSIS — M25559 Pain in unspecified hip: Secondary | ICD-10-CM | POA: Diagnosis not present

## 2012-11-30 DIAGNOSIS — M25659 Stiffness of unspecified hip, not elsewhere classified: Secondary | ICD-10-CM | POA: Diagnosis not present

## 2012-12-11 ENCOUNTER — Other Ambulatory Visit: Payer: Self-pay | Admitting: Family Medicine

## 2012-12-11 MED ORDER — LISINOPRIL-HYDROCHLOROTHIAZIDE 10-12.5 MG PO TABS: 1.0000 | ORAL_TABLET | Freq: Every day | ORAL | Status: DC

## 2013-02-04 ENCOUNTER — Other Ambulatory Visit: Payer: Self-pay | Admitting: *Deleted

## 2013-02-04 NOTE — Telephone Encounter (Signed)
Received faxed refill request from pharmacy. Last office visit 11/19/12/ acute visit, last lipid panel 01/10/12. Is it okay to refill medication?

## 2013-02-04 NOTE — Telephone Encounter (Signed)
Needs appt for CPX okay to refill until then.

## 2013-02-05 ENCOUNTER — Other Ambulatory Visit: Payer: Self-pay

## 2013-02-05 MED ORDER — ATORVASTATIN CALCIUM 40 MG PO TABS: 40.0000 mg | ORAL_TABLET | Freq: Every day | ORAL | Status: DC

## 2013-02-05 MED ORDER — FAMCICLOVIR 250 MG PO TABS: 250.0000 mg | ORAL_TABLET | Freq: Every day | ORAL | Status: DC

## 2013-02-05 MED ORDER — LISINOPRIL-HYDROCHLOROTHIAZIDE 10-12.5 MG PO TABS: 1.0000 | ORAL_TABLET | Freq: Every day | ORAL | Status: DC

## 2013-02-05 NOTE — Telephone Encounter (Signed)
Patient notified and CPX scheduled. Refill sent to pharmacy electronically.

## 2013-02-05 NOTE — Telephone Encounter (Signed)
[facility] with [facility] request refill zestoretic 10-12.5 mg. Advised # 90 x 0; pt already scheduled CPX 03/01/13.

## 2013-02-25 ENCOUNTER — Telehealth: Payer: Self-pay | Admitting: Family Medicine

## 2013-02-25 ENCOUNTER — Other Ambulatory Visit (INDEPENDENT_AMBULATORY_CARE_PROVIDER_SITE_OTHER): Payer: Medicare Other

## 2013-02-25 DIAGNOSIS — I10 Essential (primary) hypertension: Secondary | ICD-10-CM

## 2013-02-25 DIAGNOSIS — M8580 Other specified disorders of bone density and structure, unspecified site: Secondary | ICD-10-CM

## 2013-02-25 DIAGNOSIS — M899 Disorder of bone, unspecified: Secondary | ICD-10-CM | POA: Diagnosis not present

## 2013-02-25 DIAGNOSIS — E7800 Pure hypercholesterolemia, unspecified: Secondary | ICD-10-CM

## 2013-02-25 LAB — COMPREHENSIVE METABOLIC PANEL
AST: 22 U/L (ref 0–37)
BUN: 23 mg/dL (ref 6–23)
CO2: 29 mEq/L (ref 19–32)
Calcium: 9 mg/dL (ref 8.4–10.5)
Chloride: 105 mEq/L (ref 96–112)
Creatinine, Ser: 1.1 mg/dL (ref 0.4–1.2)
GFR: 53.46 mL/min — ABNORMAL LOW (ref >60.00)
Glucose, Bld: 94 mg/dL (ref 70–99)

## 2013-02-25 LAB — LIPID PANEL
Cholesterol: 155 mg/dL (ref 0–200)
HDL: 42.1 mg/dL (ref >39.00)
Triglycerides: 99 mg/dL (ref 0.0–149.0)

## 2013-02-25 NOTE — Telephone Encounter (Signed)
Message copied by Ermalene Searing Shanti Eichel E on Mon Feb 25, 2013  8:08 AM ------      Message from: Liane Comber C      Created: Thu Feb 21, 2013  9:40 AM      Regarding: Cpx labs Mon 9/15       Please order  future cpx labs for pt's upcoming lab appt.      Thanks      Tasha       ------

## 2013-03-01 ENCOUNTER — Ambulatory Visit (INDEPENDENT_AMBULATORY_CARE_PROVIDER_SITE_OTHER): Payer: Medicare Other | Admitting: Family Medicine

## 2013-03-01 ENCOUNTER — Encounter: Payer: Self-pay | Admitting: Family Medicine

## 2013-03-01 VITALS — BP 150/80 | HR 75 | Temp 98.2°F | Ht 66.5 in | Wt 174.8 lb

## 2013-03-01 DIAGNOSIS — E7800 Pure hypercholesterolemia, unspecified: Secondary | ICD-10-CM

## 2013-03-01 DIAGNOSIS — I10 Essential (primary) hypertension: Secondary | ICD-10-CM

## 2013-03-01 DIAGNOSIS — Z0000 Encounter for general adult medical examination without abnormal findings: Secondary | ICD-10-CM

## 2013-03-01 DIAGNOSIS — H9193 Unspecified hearing loss, bilateral: Secondary | ICD-10-CM

## 2013-03-01 DIAGNOSIS — Z9289 Personal history of other medical treatment: Secondary | ICD-10-CM

## 2013-03-01 DIAGNOSIS — H9190 Unspecified hearing loss, unspecified ear: Secondary | ICD-10-CM | POA: Insufficient documentation

## 2013-03-01 NOTE — Progress Notes (Signed)
HPI  I have personally reviewed the Medicare Annual Wellness questionnaire and have noted  1. The patient's medical and social history  2. Their use of alcohol, tobacco or illicit drugs  3. Their current medications and supplements  4. The patient's functional ability including ADL's, fall risks, home safety risks and hearing or visual  impairment.  5. Diet and physical activities  6. Evidence for depression or mood disorders  The patients weight, height, BMI and visual acuity have been recorded in the chart  I have made referrals, counseling and provided education to the patient based review of the above and I have provided the pt with a written personalized care plan for preventive services.    She feels well overall. Some fatigue but attributes to lack of exercise.  Hypertension: Inadequate control on low dose lisinopril/HCTZ BP Readings from Last 3 Encounters:  03/01/13 150/80  11/19/12 120/70  10/29/12 120/72  Using medication without problems or lightheadedness:  none Chest pain with exertion: None  Edema:None  Short of breath:occ  Average home BPs: not checked recently   Wt Readings from Last 3 Encounters:  03/01/13 174 lb 12 oz (79.266 kg)  11/19/12 174 lb 12.8 oz (79.289 kg)  10/29/12 177 lb 12 oz (80.627 kg)     Elevated Cholesterol: At goal LDL <130.  Lab Results  Component Value Date   CHOL 155 02/25/2013   HDL 42.10 02/25/2013   LDLCALC 93 02/25/2013   LDLDIRECT 180.6 04/04/2011   TRIG 99.0 02/25/2013   CHOLHDL 4 02/25/2013  Diet compliance: moderate  Exercise: No longer.  Other complaints:   Non smoker.  Review of Systems  Constitutional: Negative for fever and fatigue.  HENT: Negative for ear pain.  Eyes: Negative for pain.  Respiratory: Negative for shortness of breath and wheezing. Negative for chest tightness.  Cardiovascular: Negative for chest pain, palpitations and leg swelling.  Gastrointestinal: Negative for abdominal pain.  Genitourinary:  Negative for dysuria.   Has gradual hearing loss over time. Objective:   Physical Exam  Constitutional: Vital signs are normal. She appears well-developed and well-nourished. She is cooperative. Non-toxic appearance. She does not appear ill. No distress.  HENT:  Head: Normocephalic.  Right Ear: Hearing, tympanic membrane, external ear and ear canal normal.  Left Ear: Hearing, tympanic membrane, external ear and ear canal normal.  Nose: Nose normal.  Eyes: Conjunctivae, EOM and lids are normal. Pupils are equal, round, and reactive to light. No foreign bodies found.  Neck: Trachea normal and normal range of motion. Neck supple. Carotid bruit is not present. No mass and no thyromegaly present.  Cardiovascular: Normal rate, regular rhythm, S1 normal, S2 normal, normal heart sounds and intact distal pulses. Exam reveals no gallop.  No murmur heard.  Pulmonary/Chest: Effort normal and breath sounds normal. No respiratory distress. She has no wheezes. She has no rhonchi. She has no rales.  Abdominal: Soft. Normal appearance and bowel sounds are normal. She exhibits no distension, no fluid wave, no abdominal bruit and no mass. There is no hepatosplenomegaly. There is no tenderness. There is no rebound, no guarding and no CVA tenderness. No hernia.  Genitourinary: Uterus normal. No breast swelling, tenderness, discharge or bleeding. Pelvic exam was performed with patient supine  DVE : nml ovaries, nml uterus, no mass, nontender Lymphadenopathy:  She has no cervical adenopathy.  She has no axillary adenopathy.  Neurological: She is alert. She has normal strength. No cranial nerve deficit or sensory deficit.  Skin: Skin is warm, dry  and intact. No rash noted.  Psychiatric: Her speech is normal and behavior is normal. Judgment normal. Her mood appears not anxious. Cognition and memory are normal. She does not exhibit a depressed mood.  Assessment & Plan:   Annual Medicare  The patient's preventative  maintenance and recommended screening tests for an annual wellness exam were reviewed in full today.  Brought up to date unless services declined.  Counselled on the importance of diet, exercise, and its role in overall health and mortality.  The patient's FH and SH was reviewed, including their home life, tobacco status, and drug and alcohol status.   Tdap: uptodate Zostavax: Will look into insurance coverage.  Flu: Will get a drug store. No pap indicated/DVE 2012. No cancer in family. No vaginal bleeding. Will plan DVE every 2 year... Do this year.  Mammogram: Due DXA: osteopenia , nml vit D, plan repeat in 3-5 years. Colon cancer screen: 12/2006, hem. only, repeat in 10 year.

## 2013-03-01 NOTE — Assessment & Plan Note (Signed)
Borderline control.. Follow at home. May need to increase lisinopril HCTZ.

## 2013-03-01 NOTE — Assessment & Plan Note (Signed)
Not interested in audiology referral at this time.

## 2013-03-01 NOTE — Assessment & Plan Note (Signed)
Well controlled. Continue current medication.  

## 2013-03-01 NOTE — Patient Instructions (Addendum)
Check BP at home daily x 1-2 weeks, call with the measurements. Goal < 140/90. Work on regular exercise and healthy low fat, cholesterol diet. Stop at front desk to schedule mammogram.

## 2013-03-20 ENCOUNTER — Ambulatory Visit: Payer: Medicare Other

## 2013-03-21 ENCOUNTER — Ambulatory Visit
Admission: RE | Admit: 2013-03-21 | Discharge: 2013-03-21 | Disposition: A | Discharge status: Home / Self Care | Payer: Medicare Other | Source: Ambulatory Visit | Attending: Family Medicine | Admitting: Family Medicine

## 2013-03-21 DIAGNOSIS — Z9289 Personal history of other medical treatment: Secondary | ICD-10-CM

## 2013-03-21 DIAGNOSIS — Z1231 Encounter for screening mammogram for malignant neoplasm of breast: Secondary | ICD-10-CM | POA: Diagnosis not present

## 2013-04-14 ENCOUNTER — Other Ambulatory Visit: Payer: Self-pay | Admitting: Family Medicine

## 2013-05-23 ENCOUNTER — Other Ambulatory Visit: Payer: Self-pay | Admitting: Family Medicine

## 2013-05-23 NOTE — Telephone Encounter (Signed)
Last office visit 03/01/2013.  Ok to refill? 

## 2013-08-05 ENCOUNTER — Encounter: Payer: Self-pay | Admitting: Family Medicine

## 2013-08-05 ENCOUNTER — Ambulatory Visit (INDEPENDENT_AMBULATORY_CARE_PROVIDER_SITE_OTHER): Payer: Medicare Other | Admitting: Family Medicine

## 2013-08-05 VITALS — BP 116/78 | HR 98 | Temp 98.2°F | Ht 66.5 in | Wt 180.0 lb

## 2013-08-05 DIAGNOSIS — L03039 Cellulitis of unspecified toe: Secondary | ICD-10-CM

## 2013-08-05 DIAGNOSIS — L600 Ingrowing nail: Secondary | ICD-10-CM | POA: Diagnosis not present

## 2013-08-05 DIAGNOSIS — L03031 Cellulitis of right toe: Secondary | ICD-10-CM

## 2013-08-05 DIAGNOSIS — L02619 Cutaneous abscess of unspecified foot: Secondary | ICD-10-CM

## 2013-08-05 MED ORDER — CEPHALEXIN 500 MG PO CAPS: 1000.0000 mg | ORAL_CAPSULE | Freq: Two times a day (BID) | ORAL | Status: DC

## 2013-08-05 NOTE — Progress Notes (Signed)
Pre visit review using our clinic review tool, if applicable. No additional management support is needed unless otherwise documented below in the visit note. 

## 2013-08-05 NOTE — Progress Notes (Signed)
Date:  08/05/2013   Name:  Patty Shelton   DOB:  04-24-1941   MRN:  [id number] Gender: female Age: 73 y.o.  Primary Physician:  Eliezer Lofts, MD   Chief Complaint: Ingrown Toenail   Subjective:   History of Present Illness:  Patty Shelton is a 73 y.o. pleasant patient who presents with the following:  R great toe with worsening pain more on medial aspect of great toenail without significant pus formation. She has done some topical ABX and Epsom salt soaks. Has not tried to use cotton or dental floss. There is some adjacent redness / pink coloration more at the back of the toe.   Patient Active Problem List   Diagnosis Date Noted  . Hearing loss 03/01/2013  . Urinary urgency 07/08/2011  . Herpes genitalia 04/28/2011  . Osteopenia 03/22/2011  . Wheeze 03/11/2011  . Hypertension 02/11/2011  . HYPERCHOLESTEROLEMIA 03/27/2009  . ALLERGIC RHINITIS CAUSE UNSPECIFIED 03/27/2009    No past medical history on file.  No past surgical history on file.  History   Social History  . Marital Status: Married    Spouse Name: N/A    Number of Children: N/A  . Years of Education: N/A   Occupational History  . Not on file.   Social History Main Topics  . Smoking status: Never Smoker   . Smokeless tobacco: Never Used  . Alcohol Use: No  . Drug Use: No  . Sexual Activity: Not on file   Other Topics Concern  . Not on file   Social History Narrative   No living will , no HCPOA.    Family History  Problem Relation Age of Onset  . Heart attack Mother   . Stroke Father   . Hyperlipidemia Father   . Dementia Father     No Known Allergies  Medication list has been reviewed and updated.  Review of Systems:   GEN: No acute illnesses, no fevers, chills. GI: No n/v/d, eating normally Pulm: No SOB Interactive and getting along well at home.  Otherwise, ROS is as per the HPI.  Objective:   Physical Examination: BP 116/78  Pulse 98  Temp(Src) 98.2 F (36.8 C)  (Oral)  Ht 5' 6.5" (1.689 m)  Wt 180 lb (81.647 kg)  BMI 28.62 kg/m2  SpO2 95%  Ideal Body Weight: Weight in (lb) to have BMI = 25: 156.9   GEN: WDWN, NAD, Non-toxic, Alert & Oriented x 3 HEENT: Atraumatic, Normocephalic.  Ears and Nose: No external deformity. EXTR: No clubbing/cyanosis/edema NEURO: Normal gait.  PSYCH: Normally interactive. Conversant. Not depressed or anxious appearing.  Calm demeanor.   Toe: R, 1st: Somewhat downward sloping without obvious pus, some adjacent pinkish coloration caudally. Mildly ttp.  Laboratory and Imaging Data:  Assessment & Plan:   Ingrown right big toenail  Cellulitis of great toe of right foot  I suspect will do ok with conservative trial first. ABX, soaks, elevation with dental floss or cotton.  F/u if worsens.  New Prescriptions   CEPHALEXIN (KEFLEX) 500 MG CAPSULE    Take 2 capsules (1,000 mg total) by mouth 2 (two) times daily.   No orders of the defined types were placed in this encounter.   Signed,  Maud Deed. Daquarius Dubeau, MD, [place] [facility] [place] [address] Phone: [phone] Fax: [phone]  Patient Instructions  Ingrown Toenail An ingrown toenail occurs when the sharp edge of your toenail grows into  the skin. Causes of ingrown toenails include toenails clipped too far back or poorly fitting shoes. Activities involving sudden stops (basketball, tennis) causing "toe jamming" may lead to an ingrown nail.  HOME CARE INSTRUCTIONS   Soak the whole foot in warm soapy water for 20 minutes, 3 times per day.  You may lift the edge of the nail away from the sore skin by wedging a small piece of cotton under the corner of the nail. Be careful not to dig (traumatize) and cause more injury to the area.  Wear shoes that fit well. While the ingrown nail is causing problems, sandals may be beneficial.  Trim your toenails regularly and carefully. Cut your toenails  straight across, not in a curve. This will prevent injury to the skin at the corners of the toenail.  Keep your feet clean and dry.  Crutches may be helpful early in treatment if walking is painful.  Antibiotics, if prescribed, should be taken as directed.  Return for a wound check in 2 days or as directed.  Only take over-the-counter or prescription medicines for pain, discomfort, or fever as directed by your caregiver. SEEK IMMEDIATE MEDICAL CARE IF:   You have a fever.  You have increasing pain, redness, swelling, or heat at the wound site.  Your toe is not better in 7 days. If conservative treatment is not successful, surgical removal of a portion or all of the nail may be necessary. MAKE SURE YOU:   Understand these instructions.  Will watch your condition.  Will get help right away if you are not doing well or get worse. Document Released: 05/27/2000 Document Revised: 08/22/2011 Document Reviewed: 05/21/2008 [facility] Patient Information 2014 [place].   Patient's Medications  New Prescriptions   CEPHALEXIN (KEFLEX) 500 MG CAPSULE    Take 2 capsules (1,000 mg total) by mouth 2 (two) times daily.  Previous Medications   ATORVASTATIN (LIPITOR) 40 MG TABLET    Take 1 tablet (40 mg total) by mouth daily.   BIOTIN 10 MG TABS    Take 1 tablet by mouth daily.   CALCIUM CARBONATE-VITAMIN D (CALCIUM 600+D) 600-400 MG-UNIT PER TABLET    Take 1 tablet by mouth daily.   CETIRIZINE (ZYRTEC) 10 MG TABLET    Take 10 mg by mouth as needed.     CRANBERRY PO    Take 1 capsule by mouth daily.   FAMCICLOVIR (FAMVIR) 250 MG TABLET    TAKE 1 TABLET DAILY   LISINOPRIL-HYDROCHLOROTHIAZIDE (PRINZIDE,ZESTORETIC) 10-12.5 MG PER TABLET    TAKE 1 TABLET DAILY   NAPROXEN SODIUM (ANAPROX) 220 MG TABLET    Take 220 mg by mouth 2 (two) times daily with a meal.  Modified Medications   No medications on file  Discontinued Medications   No medications on file

## 2013-08-05 NOTE — Patient Instructions (Signed)

## 2013-08-21 DIAGNOSIS — Z961 Presence of intraocular lens: Secondary | ICD-10-CM | POA: Diagnosis not present

## 2013-08-21 DIAGNOSIS — H04129 Dry eye syndrome of unspecified lacrimal gland: Secondary | ICD-10-CM | POA: Diagnosis not present

## 2013-08-22 DIAGNOSIS — D1800 Hemangioma unspecified site: Secondary | ICD-10-CM | POA: Diagnosis not present

## 2013-08-22 DIAGNOSIS — L821 Other seborrheic keratosis: Secondary | ICD-10-CM | POA: Diagnosis not present

## 2013-08-31 ENCOUNTER — Ambulatory Visit (INDEPENDENT_AMBULATORY_CARE_PROVIDER_SITE_OTHER): Payer: Medicare Other | Admitting: Family Medicine

## 2013-08-31 ENCOUNTER — Encounter: Payer: Self-pay | Admitting: Family Medicine

## 2013-08-31 VITALS — BP 130/60 | HR 81 | Temp 98.0°F | Wt 178.0 lb

## 2013-08-31 DIAGNOSIS — R350 Frequency of micturition: Secondary | ICD-10-CM | POA: Diagnosis not present

## 2013-08-31 DIAGNOSIS — N390 Urinary tract infection, site not specified: Secondary | ICD-10-CM | POA: Diagnosis not present

## 2013-08-31 LAB — POCT URINALYSIS DIPSTICK
Bilirubin, UA: NEGATIVE
Glucose, UA: NEGATIVE
Ketones, UA: NEGATIVE
LEUKOCYTES UA: NEGATIVE
NITRITE UA: NEGATIVE
PROTEIN UA: NEGATIVE
Spec Grav, UA: <=1.005

## 2013-08-31 MED ORDER — CEPHALEXIN 500 MG PO CAPS: 500.0000 mg | ORAL_CAPSULE | Freq: Two times a day (BID) | ORAL | Status: DC

## 2013-08-31 NOTE — Progress Notes (Signed)
SUBJECTIVE: Patty Shelton is a 73 y.o. female who complains of urinary frequency, urgency and dysuria x 4 days, without flank pain, fever, chills, or abnormal vaginal discharge or bleeding. Patient does have a past medical history significant for urinary tract infections previously and states that this feels very similar. Patient denies any significant weight loss  Past medical history, social, surgical and family history all reviewed.    Blood pressure 130/60, pulse 81, temperature 98 F (36.7 C), temperature source Oral, weight 178 lb (80.74 kg), SpO2 95.00%.   OBJECTIVE: Appears well, in no apparent distress.  Vital signs are normal. The abdomen is soft without tenderness, guarding, mass, rebound or organomegaly. No CVA tenderness or inguinal adenopathy noted. Urine dipstick shows positive for RBC's and positive for urobilinogen.  Micro exam: not done. Color very no does have spouse no and mucous mustard colored.  ASSESSMENT: UTI uncomplicated without evidence of pyelonephritis  PLAN: Treatment per orders - also push fluids, may use Pyridium OTC prn. Call or return to clinic prn if these symptoms worsen or fail to improve as anticipated.

## 2013-08-31 NOTE — Patient Instructions (Addendum)
Very nice to meet you Keflex twice daily for 1 week Continue your over the counter medicine for your cramping Come back in 1-2 weeks if not perfect and should follow up with PCP in 3-4 weeks to make sure blood goes away in urine.   Urinary Tract Infection Urinary tract infections (UTIs) can develop anywhere along your urinary tract. Your urinary tract is your body's drainage system for removing wastes and extra water. Your urinary tract includes two kidneys, two ureters, a bladder, and a urethra. Your kidneys are a pair of bean-shaped organs. Each kidney is about the size of your fist. They are located below your ribs, one on each side of your spine. CAUSES Infections are caused by microbes, which are microscopic organisms, including fungi, viruses, and bacteria. These organisms are so small that they can only be seen through a microscope. Bacteria are the microbes that most commonly cause UTIs. SYMPTOMS  Symptoms of UTIs may vary by age and gender of the patient and by the location of the infection. Symptoms in young women typically include a frequent and intense urge to urinate and a painful, burning feeling in the bladder or urethra during urination. Older women and men are more likely to be tired, shaky, and weak and have muscle aches and abdominal pain. A fever may mean the infection is in your kidneys. Other symptoms of a kidney infection include pain in your back or sides below the ribs, nausea, and vomiting. DIAGNOSIS To diagnose a UTI, your caregiver will ask you about your symptoms. Your caregiver also will ask to provide a urine sample. The urine sample will be tested for bacteria and white blood cells. White blood cells are made by your body to help fight infection. TREATMENT  Typically, UTIs can be treated with medication. Because most UTIs are caused by a bacterial infection, they usually can be treated with the use of antibiotics. The choice of antibiotic and length of treatment depend  on your symptoms and the type of bacteria causing your infection. HOME CARE INSTRUCTIONS  If you were prescribed antibiotics, take them exactly as your caregiver instructs you. Finish the medication even if you feel better after you have only taken some of the medication.  Drink enough water and fluids to keep your urine clear or pale yellow.  Avoid caffeine, tea, and carbonated beverages. They tend to irritate your bladder.  Empty your bladder often. Avoid holding urine for long periods of time.  Empty your bladder before and after sexual intercourse.  After a bowel movement, women should cleanse from front to back. Use each tissue only once. SEEK MEDICAL CARE IF:   You have back pain.  You develop a fever.  Your symptoms do not begin to resolve within 3 days. SEEK IMMEDIATE MEDICAL CARE IF:   You have severe back pain or lower abdominal pain.  You develop chills.  You have nausea or vomiting.  You have continued burning or discomfort with urination. MAKE SURE YOU:   Understand these instructions.  Will watch your condition.  Will get help right away if you are not doing well or get worse. Document Released: 03/09/2005 Document Revised: 11/29/2011 Document Reviewed: 07/08/2011 [facility] Patient Information 2014 [place].

## 2013-09-05 ENCOUNTER — Other Ambulatory Visit: Payer: Self-pay | Admitting: Family Medicine

## 2013-09-16 ENCOUNTER — Other Ambulatory Visit: Payer: Self-pay | Admitting: *Deleted

## 2013-09-16 MED ORDER — ATORVASTATIN CALCIUM 40 MG PO TABS: 40.0000 mg | ORAL_TABLET | Freq: Every day | ORAL | Status: DC

## 2013-10-22 ENCOUNTER — Other Ambulatory Visit: Payer: Self-pay | Admitting: *Deleted

## 2013-10-22 MED ORDER — ATORVASTATIN CALCIUM 40 MG PO TABS: 40.0000 mg | ORAL_TABLET | Freq: Every day | ORAL | Status: DC

## 2013-10-23 ENCOUNTER — Ambulatory Visit (INDEPENDENT_AMBULATORY_CARE_PROVIDER_SITE_OTHER): Payer: Medicare Other | Admitting: Internal Medicine

## 2013-10-23 ENCOUNTER — Encounter: Payer: Self-pay | Admitting: Internal Medicine

## 2013-10-23 VITALS — BP 114/64 | HR 79 | Temp 98.7°F | Wt 180.0 lb

## 2013-10-23 DIAGNOSIS — N390 Urinary tract infection, site not specified: Secondary | ICD-10-CM | POA: Diagnosis not present

## 2013-10-23 DIAGNOSIS — R350 Frequency of micturition: Secondary | ICD-10-CM

## 2013-10-23 LAB — POCT URINALYSIS DIPSTICK
BILIRUBIN UA: NEGATIVE
Glucose, UA: NEGATIVE
Ketones, UA: NEGATIVE
NITRITE UA: NEGATIVE
PH UA: 7
Spec Grav, UA: <=1.005
UROBILINOGEN UA: 0.2

## 2013-10-23 MED ORDER — NITROFURANTOIN MONOHYD MACRO 100 MG PO CAPS: 100.0000 mg | ORAL_CAPSULE | Freq: Two times a day (BID) | ORAL | Status: DC

## 2013-10-23 NOTE — Progress Notes (Signed)
HPI  Pt presents to the clinic today with c/o urgency, frequency and dysuria. She reports this started 2 days ago. She reports she has had 3 UTI's in the last 2 months. Her last treatment was with keflex. She denies fever, or body aches.   Review of Systems  No past medical history on file.  Family History  Problem Relation Age of Onset  . Heart attack Mother   . Stroke Father   . Hyperlipidemia Father   . Dementia Father     History   Social History  . Marital Status: Married    Spouse Name: N/A    Number of Children: N/A  . Years of Education: N/A   Occupational History  . Not on file.   Social History Main Topics  . Smoking status: Never Smoker   . Smokeless tobacco: Never Used  . Alcohol Use: No  . Drug Use: No  . Sexual Activity: Not on file   Other Topics Concern  . Not on file   Social History Narrative   No living will , no HCPOA.    No Known Allergies  Constitutional: Denies fever, malaise, fatigue, headache or abrupt weight changes.   GU: Pt reports urgency, frequency and pain with urination. Denies burning sensation, blood in urine, odor or discharge. Skin: Denies redness, rashes, lesions or ulcercations.   No other specific complaints in a complete review of systems (except as listed in HPI above).    Objective:   Physical Exam  BP 114/64  Pulse 79  Temp(Src) 98.7 F (37.1 C) (Oral)  Wt 180 lb (81.647 kg)  SpO2 97% Wt Readings from Last 3 Encounters:  10/23/13 180 lb (81.647 kg)  08/31/13 178 lb (80.74 kg)  08/05/13 180 lb (81.647 kg)    General: Appears her stated age, well developed, well nourished in NAD. Cardiovascular: Normal rate and rhythm. S1,S2 noted.  No murmur, rubs or gallops noted. No JVD or BLE edema. No carotid bruits noted. Pulmonary/Chest: Normal effort and positive vesicular breath sounds. No respiratory distress. No wheezes, rales or ronchi noted.  Abdomen: Soft and nontender. Normal bowel sounds, no bruits noted. No  distention or masses noted. Liver, spleen and kidneys non palpable. Tender to palpation over the bladder area. No CVA tenderness.      Assessment & Plan:   Urgency, Frequency, Dysuria secondary to UTI  Urinalysis: mod leuks, trace blood Will send for urine culture eRx sent if for Macrobid 100 mg BID x 5 days OK to take AZO OTC Drink plenty of fluids  RTC as needed or if symptoms persist.

## 2013-10-23 NOTE — Patient Instructions (Addendum)
Urinary Tract Infection  Urinary tract infections (UTIs) can develop anywhere along your urinary tract. Your urinary tract is your body's drainage system for removing wastes and extra water. Your urinary tract includes two kidneys, two ureters, a bladder, and a urethra. Your kidneys are a pair of bean-shaped organs. Each kidney is about the size of your fist. They are located below your ribs, one on each side of your spine.  CAUSES  Infections are caused by microbes, which are microscopic organisms, including fungi, viruses, and bacteria. These organisms are so small that they can only be seen through a microscope. Bacteria are the microbes that most commonly cause UTIs.  SYMPTOMS   Symptoms of UTIs may vary by age and gender of the patient and by the location of the infection. Symptoms in young women typically include a frequent and intense urge to urinate and a painful, burning feeling in the bladder or urethra during urination. Older women and men are more likely to be tired, shaky, and weak and have muscle aches and abdominal pain. A fever may mean the infection is in your kidneys. Other symptoms of a kidney infection include pain in your back or sides below the ribs, nausea, and vomiting.  DIAGNOSIS  To diagnose a UTI, your caregiver will ask you about your symptoms. Your caregiver also will ask to provide a urine sample. The urine sample will be tested for bacteria and white blood cells. White blood cells are made by your body to help fight infection.  TREATMENT   Typically, UTIs can be treated with medication. Because most UTIs are caused by a bacterial infection, they usually can be treated with the use of antibiotics. The choice of antibiotic and length of treatment depend on your symptoms and the type of bacteria causing your infection.  HOME CARE INSTRUCTIONS   If you were prescribed antibiotics, take them exactly as your caregiver instructs you. Finish the medication even if you feel better after you  have only taken some of the medication.   Drink enough water and fluids to keep your urine clear or pale yellow.   Avoid caffeine, tea, and carbonated beverages. They tend to irritate your bladder.   Empty your bladder often. Avoid holding urine for long periods of time.   Empty your bladder before and after sexual intercourse.   After a bowel movement, women should cleanse from front to back. Use each tissue only once.  SEEK MEDICAL CARE IF:    You have back pain.   You develop a fever.   Your symptoms do not begin to resolve within 3 days.  SEEK IMMEDIATE MEDICAL CARE IF:    You have severe back pain or lower abdominal pain.   You develop chills.   You have nausea or vomiting.   You have continued burning or discomfort with urination.  MAKE SURE YOU:    Understand these instructions.   Will watch your condition.   Will get help right away if you are not doing well or get worse.  Document Released: 03/09/2005 Document Revised: 11/29/2011 Document Reviewed: 07/08/2011  [employer] Patient Information 2014 [employer].

## 2013-10-23 NOTE — Addendum Note (Signed)
Addended by: Lurlean Nanny on: 10/23/2013 03:17 PM   Modules accepted: Orders

## 2013-10-23 NOTE — Progress Notes (Signed)
Pre visit review using our clinic review tool, if applicable. No additional management support is needed unless otherwise documented below in the visit note. 

## 2013-10-25 LAB — URINE CULTURE: Colony Count: 3000

## 2013-11-12 ENCOUNTER — Telehealth: Payer: Self-pay

## 2013-11-12 NOTE — Telephone Encounter (Signed)
Pt left v/m wanting to know if needs labs done prior to having refill of meds; 069/19/14 office note said f/u appt and labs in 1 year. Pt did not leave which meds pt needs refilled and what pharmacy to send refills to. Left v/m requesting cb.

## 2013-11-13 MED ORDER — ATORVASTATIN CALCIUM 40 MG PO TABS: 40.0000 mg | ORAL_TABLET | Freq: Every day | ORAL | Status: DC

## 2013-11-13 NOTE — Telephone Encounter (Signed)
Pt request 90 day [facility]. Advised pt done.atorvastatin refills cancelled at [facility].

## 2014-01-27 ENCOUNTER — Other Ambulatory Visit: Payer: Self-pay | Admitting: Family Medicine

## 2014-02-04 DIAGNOSIS — H43399 Other vitreous opacities, unspecified eye: Secondary | ICD-10-CM | POA: Diagnosis not present

## 2014-02-25 ENCOUNTER — Other Ambulatory Visit: Payer: Medicare Other

## 2014-03-04 ENCOUNTER — Encounter: Payer: Medicare Other | Admitting: Family Medicine

## 2014-03-17 ENCOUNTER — Ambulatory Visit (INDEPENDENT_AMBULATORY_CARE_PROVIDER_SITE_OTHER): Payer: Medicare Other | Admitting: Family Medicine

## 2014-03-17 ENCOUNTER — Encounter: Payer: Self-pay | Admitting: Family Medicine

## 2014-03-17 VITALS — BP 120/70 | HR 76 | Temp 98.6°F | Ht 66.5 in | Wt 180.5 lb

## 2014-03-17 DIAGNOSIS — M79675 Pain in left toe(s): Secondary | ICD-10-CM

## 2014-03-17 NOTE — Progress Notes (Signed)
   Dr. Frederico Hamman T. Aubry Tucholski, MD, [facility] [place], [address] Phone: [phone] Fax: [phone]  03/17/2014  Patient: Patty Shelton, MRN: [id number], DOB: 1941/05/16, 73 y.o.  Primary Physician:  Eliezer Lofts, MD  Chief Complaint: Ingrown Toenail  Subjective:   TIFFANYE HARTMANN is a 73 y.o. very pleasant female patient who presents with the following:  L ingrown toenail. Not infected - nail hanging somewhat. Banged it and got some bleeding.  Past Medical History, Surgical History, Social History, Family History, Problem List, Medications, and Allergies have been reviewed and updated if relevant.   GEN: No acute illnesses, no fevers, chills. GI: No n/v/d, eating normally Pulm: No SOB Interactive and getting along well at home.  Otherwise, ROS is as per the HPI.  Objective:   BP 120/70  Pulse 76  Temp(Src) 98.6 F (37 C) (Oral)  Ht 5' 6.5" (1.689 m)  Wt 180 lb 8 oz (81.874 kg)  BMI 28.70 kg/m2  GEN: WDWN, NAD, Non-toxic, A & O x 3 HEENT: Atraumatic, Normocephalic. Neck supple. No masses, No LAD. Ears and Nose: No external deformity. EXTR: No c/c/e NEURO Normal gait.  PSYCH: Normally interactive. Conversant. Not depressed or anxious appearing.  Calm demeanor.   L great toenail is slightly loose without signs of infection and grossly nontender.  Laboratory and Imaging Data:  Assessment and Plan:   Great toe pain, left  At this point, nail is protective. Reassured the patient. I would not do anything at all with her nail.  Signed,  Maud Deed. Lilu Mcglown, MD   Patient's Medications  New Prescriptions   No medications on file  Previous Medications   ATORVASTATIN (LIPITOR) 40 MG TABLET    Take 1 tablet (40 mg total) by mouth daily.   BIOTIN 10 MG TABS    Take 1 tablet by mouth daily.   CALCIUM CARBONATE-VITAMIN D (CALCIUM 600+D) 600-400 MG-UNIT PER TABLET    Take 1 tablet by mouth daily.   CETIRIZINE (ZYRTEC) 10 MG TABLET    Take 10 mg by mouth as needed.     CRANBERRY PO    Take 1 capsule by mouth daily.   FAMCICLOVIR (FAMVIR) 250 MG TABLET    TAKE 1 TABLET DAILY   LISINOPRIL-HYDROCHLOROTHIAZIDE (PRINZIDE,ZESTORETIC) 10-12.5 MG PER TABLET    TAKE 1 TABLET DAILY   NAPROXEN SODIUM (ANAPROX) 220 MG TABLET    Take 220 mg by mouth as needed.   Modified Medications   No medications on file  Discontinued Medications   NITROFURANTOIN, MACROCRYSTAL-MONOHYDRATE, (MACROBID) 100 MG CAPSULE    Take 1 capsule (100 mg total) by mouth 2 (two) times daily.

## 2014-03-17 NOTE — Progress Notes (Signed)
Pre visit review using our clinic review tool, if applicable. No additional management support is needed unless otherwise documented below in the visit note. 

## 2014-03-25 ENCOUNTER — Telehealth: Payer: Self-pay | Admitting: Family Medicine

## 2014-03-25 DIAGNOSIS — M8580 Other specified disorders of bone density and structure, unspecified site: Secondary | ICD-10-CM

## 2014-03-25 DIAGNOSIS — E7800 Pure hypercholesterolemia, unspecified: Secondary | ICD-10-CM

## 2014-03-25 NOTE — Telephone Encounter (Signed)
Message copied by Jinny Sanders on Tue Mar 25, 2014 10:44 PM ------      Message from: Ellamae Sia      Created: Thu Mar 20, 2014  3:34 PM      Regarding: Lab orders for Thursday, 10.15.15       Patient is scheduled for CPX labs, please order future labs, Thanks , Terri       ------

## 2014-03-27 ENCOUNTER — Other Ambulatory Visit (INDEPENDENT_AMBULATORY_CARE_PROVIDER_SITE_OTHER): Payer: Medicare Other

## 2014-03-27 DIAGNOSIS — E7800 Pure hypercholesterolemia, unspecified: Secondary | ICD-10-CM

## 2014-03-27 DIAGNOSIS — M8580 Other specified disorders of bone density and structure, unspecified site: Secondary | ICD-10-CM | POA: Diagnosis not present

## 2014-03-27 LAB — VITAMIN D 25 HYDROXY (VIT D DEFICIENCY, FRACTURES): VITD: 49.04 ng/mL (ref 30.00–100.00)

## 2014-03-27 LAB — COMPREHENSIVE METABOLIC PANEL
ALT: 22 U/L (ref 0–35)
AST: 20 U/L (ref 0–37)
Albumin: 3.8 g/dL (ref 3.5–5.2)
Alkaline Phosphatase: 81 U/L (ref 39–117)
BUN: 23 mg/dL (ref 6–23)
CALCIUM: 9.5 mg/dL (ref 8.4–10.5)
CHLORIDE: 103 meq/L (ref 96–112)
CO2: 32 meq/L (ref 19–32)
CREATININE: 1.1 mg/dL (ref 0.4–1.2)
GFR: 49.54 mL/min — AB (ref >60.00)
GLUCOSE: 96 mg/dL (ref 70–99)
Potassium: 4.3 mEq/L (ref 3.5–5.1)
Sodium: 139 mEq/L (ref 135–145)
Total Bilirubin: 0.8 mg/dL (ref 0.2–1.2)
Total Protein: 7.4 g/dL (ref 6.0–8.3)

## 2014-03-27 LAB — LIPID PANEL
Cholesterol: 155 mg/dL (ref 0–200)
HDL: 34.3 mg/dL — AB (ref >39.00)
LDL Cholesterol: 96 mg/dL (ref 0–99)
NONHDL: 120.7
Total CHOL/HDL Ratio: 5
Triglycerides: 122 mg/dL (ref 0.0–149.0)
VLDL: 24.4 mg/dL (ref 0.0–40.0)

## 2014-04-03 ENCOUNTER — Encounter: Payer: Self-pay | Admitting: Family Medicine

## 2014-04-03 ENCOUNTER — Ambulatory Visit (INDEPENDENT_AMBULATORY_CARE_PROVIDER_SITE_OTHER): Payer: Medicare Other | Admitting: Family Medicine

## 2014-04-03 VITALS — BP 138/62 | HR 77 | Temp 98.8°F | Ht 66.0 in | Wt 178.8 lb

## 2014-04-03 DIAGNOSIS — G252 Other specified forms of tremor: Secondary | ICD-10-CM | POA: Diagnosis not present

## 2014-04-03 DIAGNOSIS — R0609 Other forms of dyspnea: Secondary | ICD-10-CM | POA: Diagnosis not present

## 2014-04-03 DIAGNOSIS — Z79899 Other long term (current) drug therapy: Secondary | ICD-10-CM | POA: Diagnosis not present

## 2014-04-03 DIAGNOSIS — E7800 Pure hypercholesterolemia, unspecified: Secondary | ICD-10-CM

## 2014-04-03 DIAGNOSIS — R252 Cramp and spasm: Secondary | ICD-10-CM | POA: Diagnosis not present

## 2014-04-03 DIAGNOSIS — Z0000 Encounter for general adult medical examination without abnormal findings: Secondary | ICD-10-CM

## 2014-04-03 DIAGNOSIS — Z23 Encounter for immunization: Secondary | ICD-10-CM | POA: Diagnosis not present

## 2014-04-03 DIAGNOSIS — G250 Essential tremor: Secondary | ICD-10-CM | POA: Insufficient documentation

## 2014-04-03 DIAGNOSIS — R0982 Postnasal drip: Secondary | ICD-10-CM | POA: Diagnosis not present

## 2014-04-03 DIAGNOSIS — I10 Essential (primary) hypertension: Secondary | ICD-10-CM | POA: Diagnosis not present

## 2014-04-03 NOTE — Assessment & Plan Note (Signed)
Eval with labs ( for anemia, thyroid and CHF) and EKG.

## 2014-04-03 NOTE — Assessment & Plan Note (Signed)
Well controlled. Continue current medication. Leg cramp Andie Mungin be due to statin. If not improving with water, consider trial of statin or co Enz Q10.

## 2014-04-03 NOTE — Progress Notes (Signed)
HPI  I have personally reviewed the Medicare Annual Wellness questionnaire and have noted  1. The patient's medical and social history  2. Their use of alcohol, tobacco or illicit drugs  3. Their current medications and supplements  4. The patient's functional ability including ADL's, fall risks, home safety risks and hearing or visual  impairment.  5. Diet and physical activities  6. Evidence for depression or mood disorders  The patients weight, height, BMI and visual acuity have been recorded in the chart  I have made referrals, counseling and provided education to the patient based review of the above and I have provided the pt with a written personalized care plan for preventive services.   She feels well overall.   She has been more short of breath with climbing stairs, walking x 3-4 months. No SOB at rest.  No associated chest pain. A little wheeze. No cough. No fever. Some fatigue. No peripheral swelling  She clears her throat a lot. Post nasal drip, mouth dry at night. Ongoing for a long time. Has tried  Uses zyrtec and mucinex. Year round but worse in fall and spring.  Occ heartburn  She is deconditioned, no regular exercise.  She has multiple other health issues. She has had some tail bone pain. Right leg pain and cramps, toe cramps. Using ibuprofen prn. She has noted chin quivering when concentrating.  Hypertension: Well controlled on lisinopril/HCTZ  BP Readings from Last 3 Encounters:  04/03/14 138/62  03/17/14 120/70  10/23/13 114/64  Using medication without problems or lightheadedness: none  Chest pain with exertion: None  Edema:None  Short of breath:yes. Average home BPs: not checked recently  Wt Readings from Last 3 Encounters:  04/03/14 178 lb 12 oz (81.08 kg)  03/17/14 180 lb 8 oz (81.874 kg)  10/23/13 180 lb (81.647 kg)    Elevated Cholesterol: At goal LDL <130 on lipitor. Lab Results  Component Value Date   CHOL 155 03/27/2014   HDL 34.30*  03/27/2014   LDLCALC 96 03/27/2014   LDLDIRECT 180.6 04/04/2011   TRIG 122.0 03/27/2014   CHOLHDL 5 03/27/2014  Diet compliance: moderate  Exercise: No longer.  Other complaints:  Non smoker.   Review of Systems  Constitutional: Negative for fever and fatigue.  HENT: Negative for ear pain.  Eyes: Negative for pain.  Respiratory: Negative for shortness of breath and wheezing. Negative for chest tightness.  Cardiovascular: Negative for chest pain, palpitations and leg swelling.  Gastrointestinal: Negative for abdominal pain.  Genitourinary: Negative for dysuria.  Has gradual hearing loss over time.  Objective:   Physical Exam  Constitutional: Vital signs are normal. She appears well-developed and well-nourished. She is cooperative. Non-toxic appearance. She does not appear ill. No distress.  HENT:  Head: Normocephalic.  Right Ear: Hearing, tympanic membrane, external ear and ear canal normal.  Left Ear: Hearing, tympanic membrane, external ear and ear canal normal.  Nose: Nose normal.  Eyes: Conjunctivae, EOM and lids are normal. Pupils are equal, round, and reactive to light. No foreign bodies found.  Neck: Trachea normal and normal range of motion. Neck supple. Carotid bruit is not present. No mass and no thyromegaly present.  Cardiovascular: Normal rate, regular rhythm, S1 normal, S2 normal, normal heart sounds and intact distal pulses. Exam reveals no gallop.  No murmur heard.  Pulmonary/Chest: Effort normal and breath sounds normal. No respiratory distress. She has no wheezes. She has no rhonchi. She has no rales.  Abdominal: Soft. Normal appearance and bowel sounds  are normal. She exhibits no distension, no fluid wave, no abdominal bruit and no mass. There is no hepatosplenomegaly. There is no tenderness. There is no rebound, no guarding and no CVA tenderness. No hernia.  Genitourinary: Uterus normal. No breast swelling, tenderness, discharge or bleeding. Pelvic exam was  performed with patient supine  DVE : nml ovaries, nml uterus, no mass, nontender Lymphadenopathy:  She has no cervical adenopathy.  She has no axillary adenopathy.  Neurological: She is alert. She has normal strength. No cranial nerve deficit or sensory deficit.  Chin tremoring during rest. Skin: Skin is warm, dry and intact. No rash noted.  Psychiatric: Her speech is normal and behavior is normal. Judgment normal. Her mood appears not anxious. Cognition and memory are normal. She does not exhibit a depressed mood.  Assessment & Plan:   Annual Medicare  The patient's preventative maintenance and recommended screening tests for an annual wellness exam were reviewed in full today.  Brought up to date unless services declined.  Counselled on the importance of diet, exercise, and its role in overall health and mortality.  The patient's FH and SH was reviewed, including their home life, tobacco status, and drug and alcohol status.   Vaccines: given flu and prevnar today. No pap indicated/DVE 2014. No cancer in family. No vaginal bleeding. Will plan DVE every 2 year.  Mammogram: Plan every other year. DXA: osteopenia 2012, nml vit D, plan repeat in 5 years.  Colon cancer screen: 12/2006, hem. only, repeat in 10 year.

## 2014-04-03 NOTE — Patient Instructions (Addendum)
Start flonase 2 sprays per nostril daily. Call if post nasal drip and clearing throat is not improving with flonase for consideration of change in lisinopril to losartan. Increase water in diet 64 ounces of water a day.  if leg cramps not improving after several months of increase water. Can try 1 week off lipitor to see if cramps better. If so let me know.Increase exercise as able. Stop at lab on way out.

## 2014-04-03 NOTE — Assessment & Plan Note (Signed)
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 Eval with labs . Consider referral to neuro.

## 2014-04-03 NOTE — Assessment & Plan Note (Signed)
Trial of nasal steroid in addition to zyrtec. If not improving try changing lisinopril to losartan given clearing throat etc.

## 2014-04-03 NOTE — Progress Notes (Signed)
Pre visit review using our clinic review tool, if applicable. No additional management support is needed unless otherwise documented below in the visit note. 

## 2014-04-03 NOTE — Assessment & Plan Note (Signed)
Well controlled. Continue current medication.  

## 2014-04-04 LAB — TSH: TSH: 2.46 u[IU]/mL (ref 0.35–4.50)

## 2014-04-04 LAB — CBC WITH DIFFERENTIAL/PLATELET
Basophils Absolute: 0 10*3/uL (ref 0.0–0.1)
Basophils Relative: 0.3 % (ref 0.0–3.0)
EOS PCT: 3.1 % (ref 0.0–5.0)
Eosinophils Absolute: 0.3 10*3/uL (ref 0.0–0.7)
HEMATOCRIT: 39 % (ref 36.0–46.0)
HEMOGLOBIN: 12.9 g/dL (ref 12.0–15.0)
LYMPHS ABS: 2.6 10*3/uL (ref 0.7–4.0)
Lymphocytes Relative: 30.5 % (ref 12.0–46.0)
MCHC: 33.1 g/dL (ref 30.0–36.0)
MCV: 88.9 fl (ref 78.0–100.0)
MONOS PCT: 7.2 % (ref 3.0–12.0)
Monocytes Absolute: 0.6 10*3/uL (ref 0.1–1.0)
NEUTROS ABS: 4.9 10*3/uL (ref 1.4–7.7)
Neutrophils Relative %: 58.9 % (ref 43.0–77.0)
Platelets: 254 10*3/uL (ref 150.0–400.0)
RBC: 4.39 Mil/uL (ref 3.87–5.11)
RDW: 12.9 % (ref 11.5–15.5)
WBC: 8.4 10*3/uL (ref 4.0–10.5)

## 2014-04-04 LAB — BRAIN NATRIURETIC PEPTIDE: Pro B Natriuretic peptide (BNP): 32 pg/mL (ref 0.0–100.0)

## 2014-04-05 ENCOUNTER — Other Ambulatory Visit: Payer: Self-pay | Admitting: Family Medicine

## 2014-04-09 ENCOUNTER — Other Ambulatory Visit: Payer: Self-pay | Admitting: Family Medicine

## 2014-05-28 ENCOUNTER — Encounter: Payer: Self-pay | Admitting: Family Medicine

## 2014-05-28 ENCOUNTER — Telehealth: Payer: Self-pay

## 2014-05-28 ENCOUNTER — Ambulatory Visit (INDEPENDENT_AMBULATORY_CARE_PROVIDER_SITE_OTHER): Payer: Medicare Other | Admitting: Family Medicine

## 2014-05-28 VITALS — BP 140/64 | HR 78 | Temp 98.2°F | Ht 66.0 in | Wt 180.8 lb

## 2014-05-28 DIAGNOSIS — M7661 Achilles tendinitis, right leg: Secondary | ICD-10-CM

## 2014-05-28 MED ORDER — FAMCICLOVIR 250 MG PO TABS: 250.0000 mg | ORAL_TABLET | Freq: Every day | ORAL | Status: DC

## 2014-05-28 MED ORDER — NITROGLYCERIN 0.2 MG/HR TD PT24: MEDICATED_PATCH | TRANSDERMAL | Status: DC

## 2014-05-28 NOTE — Telephone Encounter (Signed)
Melonie at [facility] said cannot find info about stability of ntg patch after foil is opened; Melonie said if Dr Lorelei Pont or his CMA will tell her it is OK to open patch cut 1/4 of patch 4 times and use 1/4 of patch daily for 4 days that is what [facility] will do but needs guidance since not in manufacturer info. Butch Penny CMA for Dr Lorelei Pont said OK to use 1/4 of patch daily for 4 days after foil is opened. Melonie voiced understanding.

## 2014-05-28 NOTE — Progress Notes (Signed)
Pre visit review using our clinic review tool, if applicable. No additional management support is needed unless otherwise documented below in the visit note. 

## 2014-05-28 NOTE — Progress Notes (Signed)
Dr. Frederico Hamman T. Yonah Tangeman, MD, [facility] Medicine [place], [address] Phone: [phone] Fax: [phone]  05/28/2014  Patient: Patty Shelton, MRN: [id number], DOB: [birth year], 73 y.o.  Primary Physician:  Eliezer Lofts, MD  Chief Complaint: Foot Pain  Subjective:   Pleasant patient who presents with a 3 mo history of posterior heel pain.  No occult, abrupt onset. Has been more insidious in character. There is a dull ache present and worse with activity:  Prior home rehab: none Prior meds: alleve Orthosis / Braces: none  Achilles tendinopathy since 02/2014. Better and worse.  Right  The PMH, PSH, Social History, Family History, Medications, and allergies have been reviewed in [facility], and have been updated if relevant.  GEN: No fevers, chills. Nontoxic. Primarily MSK c/o today. MSK: Detailed in the HPI GI: tolerating PO intake without difficulty Neuro: No numbness, parasthesias, or tingling associated. Otherwise the pertinent positives of the ROS are noted above.   Objective:   Blood pressure 140/64, pulse 78, temperature 98.2 F (36.8 C), temperature source Oral, height 5\' 6"  (1.676 m), weight 180 lb 12 oz (81.988 kg).   GEN: Well-developed,well-nourished,in no acute distress; alert,appropriate and cooperative throughout examination HEENT: Normocephalic and atraumatic without obvious abnormalities. Ears, externally no deformities PULM: Breathing comfortably in no respiratory distress EXT: No clubbing, cyanosis, or edema PSYCH: Normally interactive. Cooperative during the interview. Pleasant. Friendly and conversant. Not anxious or depressed appearing. Normal, full affect.  Foot: R Echymosis: no Edema: no ROM: full LE B Gait: heel toe, non-antalgic MT pain: no Callus pattern: none Lateral Mall: NT Medial Mall: NT Talus: NT Navicular: NT Cuboid: NT Calcaneous: NT Metatarsals: NT 5th MT: NT Phalanges:  NT Achilles: PAINFUL TO PALPATE AT INSERTION ON R, SMALL NODULE Plantar Fascia: NT Fat Pad: NT Peroneals: NT Post Tib: NT Great Toe: Nml motion Ant Drawer: neg ATFL: NT CFL: NT Deltoid: NT Sensation: intact  Radiology: No results found.  Assessment and Plan:   Achilles tendinitis of right lower extremity  Pathophysiology of achilles tendinopathy reviewed.  Additionally, I have given the patient the program emphasizing eccentric overloading detailed in the instructions based on Dr. Trudi Ida work and protocols.  Supportive footwear reviewed.   Patient Instructions  Nitroglycerin Protocol   Apply 1/4 nitroglycerin patch to affected area daily.  Change position of patch within the affected area every 24 hours.  You may experience a headache during the first 1-2 weeks of using the patch, these should subside.  If you experience headaches after beginning nitroglycerin patch treatment, you may take your preferred over the counter pain reliever.  Another side effect of the nitroglycerin patch is skin irritation or rash related to patch adhesive.  Please notify our office if you develop more severe headaches or rash, and stop the patch.  Tendon healing with nitroglycerin patch may require 12 to 24 weeks depending on the extent of injury.  Men should not use if taking Viagra, Cialis, or Levitra.   Do not use if you have migraines or rosacea.     Achilles Rehab  Begin with easy walking, heel, toe and backwards  Calf raises on a step First lower and then raise on 1 foot If this is painful lower on 1 foot but do the heel raise on both feet  Begin with 3 sets of 10 repetitions  Increase by 5 repetitions every 3 days  Goal is 3 sets of 30 repetitions  Do with both knee  straight and knee at 20 degrees of flexion  If pain persists at 3 sets of 30 - add backpack with 5 lbs Increase by 5 lbs per week to max of 30 lbs      Follow-up: 2 mo   New Prescriptions    NITROGLYCERIN (NITRODUR - DOSED IN MG/24 HR) 0.2 MG/HR PATCH    Apply 1/4 of a patch to the affected area and change every 24 hours (re: tendinopathy)   No orders of the defined types were placed in this encounter.    Signed,  Maud Deed. Taevion Sikora, MD   Patient's Medications  New Prescriptions   NITROGLYCERIN (NITRODUR - DOSED IN MG/24 HR) 0.2 MG/HR PATCH    Apply 1/4 of a patch to the affected area and change every 24 hours (re: tendinopathy)  Previous Medications   ATORVASTATIN (LIPITOR) 40 MG TABLET    TAKE 1 TABLET DAILY   BIOTIN 10 MG TABS    Take 1 tablet by mouth daily.   CALCIUM CARBONATE-VITAMIN D (CALCIUM 600+D) 600-400 MG-UNIT PER TABLET    Take 1 tablet by mouth daily.   CETIRIZINE (ZYRTEC) 10 MG TABLET    Take 10 mg by mouth as needed.     CRANBERRY PO    Take 1 capsule by mouth daily.   LISINOPRIL-HYDROCHLOROTHIAZIDE (PRINZIDE,ZESTORETIC) 10-12.5 MG PER TABLET    TAKE 1 TABLET DAILY   NAPROXEN SODIUM (ANAPROX) 220 MG TABLET    Take 220 mg by mouth as needed.   Modified Medications   Modified Medication Previous Medication   FAMCICLOVIR (FAMVIR) 250 MG TABLET famciclovir (FAMVIR) 250 MG tablet      Take 1 tablet (250 mg total) by mouth daily.    TAKE 1 TABLET DAILY  Discontinued Medications   No medications on file

## 2014-05-28 NOTE — Patient Instructions (Addendum)
Nitroglycerin Protocol   Apply 1/4 nitroglycerin patch to affected area daily.  Change position of patch within the affected area every 24 hours.  You may experience a headache during the first 1-2 weeks of using the patch, these should subside.  If you experience headaches after beginning nitroglycerin patch treatment, you may take your preferred over the counter pain reliever.  Another side effect of the nitroglycerin patch is skin irritation or rash related to patch adhesive.  Please notify our office if you develop more severe headaches or rash, and stop the patch.  Tendon healing with nitroglycerin patch may require 12 to 24 weeks depending on the extent of injury.  Men should not use if taking Viagra, Cialis, or Levitra.   Do not use if you have migraines or rosacea.     Achilles Rehab:  Begin with easy walking, heel, toe and backwards  Calf raises on a step First lower and then raise on 1 foot If this is painful lower on 1 foot but do the heel raise on both feet  Begin with 3 sets of 10 repetitions  Increase by 5 repetitions every 3 days  Goal is 3 sets of 30 repetitions  Do with both knee straight and knee at 20 degrees of flexion  If pain persists at 3 sets of 30 - add backpack with 5 lbs Increase by 5 lbs per week to max of 30 lbs 

## 2014-08-29 ENCOUNTER — Other Ambulatory Visit: Payer: Self-pay | Admitting: Family Medicine

## 2014-09-11 DIAGNOSIS — H04123 Dry eye syndrome of bilateral lacrimal glands: Secondary | ICD-10-CM | POA: Diagnosis not present

## 2014-11-27 DIAGNOSIS — D229 Melanocytic nevi, unspecified: Secondary | ICD-10-CM | POA: Diagnosis not present

## 2014-11-27 DIAGNOSIS — Z1283 Encounter for screening for malignant neoplasm of skin: Secondary | ICD-10-CM | POA: Diagnosis not present

## 2014-11-27 DIAGNOSIS — L814 Other melanin hyperpigmentation: Secondary | ICD-10-CM | POA: Diagnosis not present

## 2014-11-27 DIAGNOSIS — D1800 Hemangioma unspecified site: Secondary | ICD-10-CM | POA: Diagnosis not present

## 2014-11-27 DIAGNOSIS — L821 Other seborrheic keratosis: Secondary | ICD-10-CM | POA: Diagnosis not present

## 2014-11-27 DIAGNOSIS — L578 Other skin changes due to chronic exposure to nonionizing radiation: Secondary | ICD-10-CM | POA: Diagnosis not present

## 2015-01-05 ENCOUNTER — Encounter: Payer: Self-pay | Admitting: Family Medicine

## 2015-01-05 ENCOUNTER — Ambulatory Visit (INDEPENDENT_AMBULATORY_CARE_PROVIDER_SITE_OTHER): Payer: Medicare Other | Admitting: Family Medicine

## 2015-01-05 VITALS — BP 118/60 | HR 73 | Temp 98.2°F | Ht 66.0 in | Wt 171.5 lb

## 2015-01-05 DIAGNOSIS — B351 Tinea unguium: Secondary | ICD-10-CM | POA: Diagnosis not present

## 2015-01-05 DIAGNOSIS — M7062 Trochanteric bursitis, left hip: Secondary | ICD-10-CM

## 2015-01-05 DIAGNOSIS — M7661 Achilles tendinitis, right leg: Secondary | ICD-10-CM | POA: Diagnosis not present

## 2015-01-05 MED ORDER — METHYLPREDNISOLONE ACETATE 40 MG/ML IJ SUSP
80.0000 mg | Freq: Once | INTRAMUSCULAR | Status: AC
  Administered 2015-01-05: 80 mg via INTRA_ARTICULAR

## 2015-01-05 NOTE — Progress Notes (Signed)
Pre visit review using our clinic review tool, if applicable. No additional management support is needed unless otherwise documented below in the visit note. 

## 2015-01-05 NOTE — Patient Instructions (Signed)
REFERRALS TO SPECIALISTS, SPECIAL TESTS (MRI, CT, ULTRASOUNDS)  [facility] or [facility] will help you. ASK CHECK-IN FOR HELP.  Imaging / Special Testing referrals sometimes can be done same day if EMERGENCY, but others can take 2 or 3 days to get an appointment. Starting in 2015, many of the new Medicare plans and Obamacare plans take much longer.   Specialist appointment times vary a great deal, based on their schedule / openings. -- Some specialists have very long wait times. (Example. Dermatology. Multiple months  for non-cancer)    Hip Rehab:  Hip Flexion: Toe up to ceiling, laying on your back. Lift your whole leg, 3 sets. Work up to being able to do #30 with each set.  Hip elevations, Toe and leg turned out to side.  Lift whole leg, 3 sets. Work up to being able to do #30 with each set.  Hip Abductions: Lying on side, straight out to side. 3 sets, work up to being able to do #30 with each set.  At the beginning you may only be able to do a lot less, try to do #10.   Achilles Rehab  Begin with easy walking, heel, toe and backwards  Calf raises on a step First lower and then raise on 1 foot If this is painful lower on 1 foot but do the heel raise on both feet  Begin with 3 sets of 10 repetitions  Increase by 5 repetitions every 3 days  Goal is 3 sets of 30 repetitions  Do with both knee straight and knee at 20 degrees of flexion  If pain persists at 3 sets of 30 - add backpack with 5 lbs Increase by 5 lbs per week to max of 30 lbs

## 2015-01-05 NOTE — Progress Notes (Signed)
Dr. Frederico Hamman T. Fairley Copher, MD, [facility] Primary [facility] and Sports Medicine [place], [address] Phone: [phone] Fax: [phone]  01/05/2015  Patient: Patty Shelton, MRN: [id number], DOB: [birth year], 74 y.o.  Primary Physician:  Eliezer Lofts, MD  Chief Complaint: Hip Pain; Foot Pain; Check Left Big Toe; and Leg Pain  Subjective:   Patty Shelton is a 74 y.o. very pleasant female patient who presents with the following:  R achilles tendinopathy, ongoing. Ongoing x 1 year.  Not good at doing any rehab. She basically has not done any rehabilitation at all, and she has not done the eccentric protocol that I gave her. She did do some topical nitroglycerin, but discontinued this after she felt like it was not helping all that much.  L ingrown toenail. She has now lost her toenail.  L leg with a tendon in the back of her leg will hurt some. When standing up it will bother her some.   L > R hip hurting the most. Primarily she is having left lateral hip pain without pain in the groin and no significant pain in the back. She also is having some right lateral hip pain, but this is to a much lesser extent compared to the left. She is quite weak and has not been doing any kind of physical exercise or rehabilitation for using these hip problems. GTB, inj  PT  Past Medical History, Surgical History, Social History, Family History, Problem List, Medications, and Allergies have been reviewed and updated if relevant.  Patient Active Problem List   Diagnosis Date Noted  . Dyspnea on exertion 04/03/2014  . Post-nasal drip 04/03/2014  . Leg cramps 04/03/2014  . 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 Hearing loss 03/01/2013  . Herpes genitalia 04/28/2011  . Osteopenia 03/22/2011  . Hypertension 02/11/2011  . HYPERCHOLESTEROLEMIA 03/27/2009  . ALLERGIC RHINITIS CAUSE UNSPECIFIED 03/27/2009    No past medical history on file.  No past surgical history on file.  History    Social History  . Marital Status: Married    Spouse Name: N/A  . Number of Children: N/A  . Years of Education: N/A   Occupational History  . Not on file.   Social History Main Topics  . Smoking status: Never Smoker   . Smokeless tobacco: Never Used  . Alcohol Use: No  . Drug Use: No  . Sexual Activity: Not on file   Other Topics Concern  . Not on file   Social History Narrative   No living will , no HCPOA. Full code ( reviewed 2015, given packet)    Family History  Problem Relation Age of Onset  . Heart attack Mother   . Stroke Father   . Hyperlipidemia Father   . Dementia Father     No Known Allergies  Medication list reviewed and updated in full in [facility].  GEN: No fevers, chills. Nontoxic. Primarily MSK c/o today. MSK: Detailed in the HPI GI: tolerating PO intake without difficulty Neuro: No numbness, parasthesias, or tingling associated. Otherwise the pertinent positives of the ROS are noted above.   Objective:   BP 118/60 mmHg  Pulse 73  Temp(Src) 98.2 F (36.8 C) (Oral)  Ht 5\' 6"  (1.676 m)  Wt 171 lb 8 oz (77.792 kg)  BMI 27.69 kg/m2   GEN: Well-developed,well-nourished,in no acute distress; alert,appropriate and cooperative throughout examination HEENT: Normocephalic and atraumatic without obvious abnormalities. Ears, externally no deformities PULM: Breathing comfortably in no  respiratory distress EXT: No clubbing, cyanosis, or edema PSYCH: Normally interactive. Cooperative during the interview. Pleasant. Friendly and conversant. Not anxious or depressed appearing. Normal, full affect.  Foot: R Echymosis: no Edema: no ROM: full LE B Gait: heel toe, non-antalgic MT pain: no Callus pattern: none Lateral Mall: NT Medial Mall: NT Talus: NT Navicular: NT Cuboid: NT Calcaneous: NT Metatarsals: NT 5th MT: NT Phalanges: NT Achilles: PAINFUL TO PALPATE AT INSERTION ON R, large nodule Plantar Fascia: NT Fat Pad: NT Peroneals:  NT Post Tib: NT Great Toe: Nml motion Ant Drawer: neg ATFL: NT CFL: NT Deltoid: NT Sensation: intact   HIP EXAM: SIDE: L  ROM: Abduction, Flexion, Internal and External range of motion: rel preserved Pain with terminal IROM and EROM: mild GTB: notable L GTB SLR: NEG Knees: No effusion FABER: NT REVERSE FABER: NT, neg Piriformis: NT at direct palpation Str: flexion: 4+/5 abduction: 4/5 adduction: 5/5 Strength testing non-tender  L toenail, 1st, growing back on toenail  Radiology: RIGHT HIP - COMPLETE 2+ VIEW  Comparison: None  Findings: There is mild osteoarthritis involving both hips. No acute fracture or subluxation identified.  No radiopaque foreign bodies or soft tissue calcifications.  IMPRESSION:  1. Mild bilateral hip osteoarthritis.   Original Report Authenticated By: Kerby Moors, M.D.  Assessment and Plan:   Trochanteric bursitis of left hip - Plan: Ambulatory referral to Physical Therapy, methylPREDNISolone acetate (DEPO-MEDROL) injection 80 mg  Achilles tendinitis of right lower extremity - Plan: Ambulatory referral to Physical Therapy  Onychomycosis of toenail  All issues have compounded to make each other worse Weak core Chronic achilles may have preceded Has not done well with compliance with HEP - will send for directed PT  Inject hip today  Trochanteric Bursitis Injection, LEFT Verbal consent obtained. Risks (including infection, potential atrophy), benefits, and alternatives reviewed. Greater trochanter sterilely prepped with Chloraprep. Ethyl Chloride used for anesthesia. 8 cc of Lidocaine 1% injected with Depo-Medrol 80 mg into trochanteric bursa at area of maximal tenderness at greater trochanter. Needle taken to bone to troch bursa, flows easily. Bursa massaged. No bleeding and no complications. Decreased pain after injection. Needle: 22 gauge spinal needle   Follow-up: 2 mo  Patient Instructions  REFERRALS TO SPECIALISTS,  SPECIAL TESTS (MRI, CT, ULTRASOUNDS)  [facility] or [facility] will help you. ASK CHECK-IN FOR HELP.  Imaging / Special Testing referrals sometimes can be done same day if EMERGENCY, but others can take 2 or 3 days to get an appointment. Starting in 2015, many of the new Medicare plans and Obamacare plans take much longer.   Specialist appointment times vary a great deal, based on their schedule / openings. -- Some specialists have very long wait times. (Example. Dermatology. Multiple months  for non-cancer)    Hip Rehab:  Hip Flexion: Toe up to ceiling, laying on your back. Lift your whole leg, 3 sets. Work up to being able to do #30 with each set.  Hip elevations, Toe and leg turned out to side.  Lift whole leg, 3 sets. Work up to being able to do #30 with each set.  Hip Abductions: Lying on side, straight out to side. 3 sets, work up to being able to do #30 with each set.  At the beginning you may only be able to do a lot less, try to do #10.   Achilles Rehab  Begin with easy walking, heel, toe and backwards  Calf raises on a step First lower and then raise on 1 foot  If this is painful lower on 1 foot but do the heel raise on both feet  Begin with 3 sets of 10 repetitions  Increase by 5 repetitions every 3 days  Goal is 3 sets of 30 repetitions  Do with both knee straight and knee at 20 degrees of flexion  If pain persists at 3 sets of 30 - add backpack with 5 lbs Increase by 5 lbs per week to max of 30 lbs      New Prescriptions   No medications on file   Orders Placed This Encounter  Procedures  . Ambulatory referral to Physical Therapy    Signed,  Frederico Hamman T. Jhoselyn Ruffini, MD   Patient's Medications  New Prescriptions   No medications on file  Previous Medications   ATORVASTATIN (LIPITOR) 40 MG TABLET    TAKE 1 TABLET DAILY   BIOTIN 10 MG TABS    Take 1 tablet by mouth daily.   CALCIUM CARBONATE-VITAMIN D (CALCIUM 600+D) 600-400 MG-UNIT PER TABLET    Take 1 tablet  by mouth daily.   CETIRIZINE (ZYRTEC) 10 MG TABLET    Take 10 mg by mouth as needed.     CRANBERRY PO    Take 1 capsule by mouth daily.   FAMCICLOVIR (FAMVIR) 250 MG TABLET    Take 1 tablet (250 mg total) by mouth daily.   LISINOPRIL-HYDROCHLOROTHIAZIDE (PRINZIDE,ZESTORETIC) 10-12.5 MG PER TABLET    TAKE 1 TABLET DAILY   NAPROXEN SODIUM (ANAPROX) 220 MG TABLET    Take 220 mg by mouth as needed.   Modified Medications   No medications on file  Discontinued Medications   NITROGLYCERIN (NITRODUR - DOSED IN MG/24 HR) 0.2 MG/HR PATCH    Apply 1/4 of a patch to the affected area and change every 24 hours (re: tendinopathy)

## 2015-01-08 DIAGNOSIS — M7062 Trochanteric bursitis, left hip: Secondary | ICD-10-CM | POA: Diagnosis not present

## 2015-01-08 DIAGNOSIS — M7661 Achilles tendinitis, right leg: Secondary | ICD-10-CM | POA: Diagnosis not present

## 2015-01-12 DIAGNOSIS — M7062 Trochanteric bursitis, left hip: Secondary | ICD-10-CM | POA: Diagnosis not present

## 2015-01-12 DIAGNOSIS — M7661 Achilles tendinitis, right leg: Secondary | ICD-10-CM | POA: Diagnosis not present

## 2015-01-15 DIAGNOSIS — M7062 Trochanteric bursitis, left hip: Secondary | ICD-10-CM | POA: Diagnosis not present

## 2015-01-15 DIAGNOSIS — M7661 Achilles tendinitis, right leg: Secondary | ICD-10-CM | POA: Diagnosis not present

## 2015-01-19 DIAGNOSIS — M7661 Achilles tendinitis, right leg: Secondary | ICD-10-CM | POA: Diagnosis not present

## 2015-01-19 DIAGNOSIS — M7062 Trochanteric bursitis, left hip: Secondary | ICD-10-CM | POA: Diagnosis not present

## 2015-01-22 DIAGNOSIS — M7661 Achilles tendinitis, right leg: Secondary | ICD-10-CM | POA: Diagnosis not present

## 2015-01-22 DIAGNOSIS — M7062 Trochanteric bursitis, left hip: Secondary | ICD-10-CM | POA: Diagnosis not present

## 2015-01-26 DIAGNOSIS — M7062 Trochanteric bursitis, left hip: Secondary | ICD-10-CM | POA: Diagnosis not present

## 2015-01-26 DIAGNOSIS — M7661 Achilles tendinitis, right leg: Secondary | ICD-10-CM | POA: Diagnosis not present

## 2015-01-28 DIAGNOSIS — M7062 Trochanteric bursitis, left hip: Secondary | ICD-10-CM | POA: Diagnosis not present

## 2015-01-28 DIAGNOSIS — M7661 Achilles tendinitis, right leg: Secondary | ICD-10-CM | POA: Diagnosis not present

## 2015-02-02 DIAGNOSIS — M7062 Trochanteric bursitis, left hip: Secondary | ICD-10-CM | POA: Diagnosis not present

## 2015-02-02 DIAGNOSIS — M7661 Achilles tendinitis, right leg: Secondary | ICD-10-CM | POA: Diagnosis not present

## 2015-02-04 ENCOUNTER — Ambulatory Visit (INDEPENDENT_AMBULATORY_CARE_PROVIDER_SITE_OTHER): Payer: Medicare Other | Admitting: Family Medicine

## 2015-02-04 ENCOUNTER — Encounter: Payer: Self-pay | Admitting: Family Medicine

## 2015-02-04 VITALS — BP 130/74 | HR 77 | Temp 98.6°F | Ht 66.0 in | Wt 170.2 lb

## 2015-02-04 DIAGNOSIS — M7062 Trochanteric bursitis, left hip: Secondary | ICD-10-CM

## 2015-02-04 NOTE — Progress Notes (Signed)
Pre visit review using our clinic review tool, if applicable. No additional management support is needed unless otherwise documented below in the visit note. 

## 2015-02-04 NOTE — Progress Notes (Signed)
Dr. Frederico Hamman T. Bobbi Kozakiewicz, MD, [facility] [place], [address] Phone: [phone] Fax: [phone]  02/04/2015  Patient: Patty Shelton, MRN: [id number], DOB: 24-Mar-1941, 74 y.o.  Primary Physician:  Eliezer Lofts, MD  Chief Complaint: Follow-up  Subjective:   Patty Shelton is a 74 y.o. very pleasant female patient who presents with the following:  F/u multiple MSK issues.   Ongoing L hip pain. Went to physical therapy - core has improved in her strength.  Left lateral hip, now the right hip is bothering her some now also.  She is still having pain laterally, primarily at her trochanteric bursa.  She denies groin pain.  She is not really having any back pain.  I had her go over essentially the entirety of her home exercise program with me today in the office.  Achilles is improved.   01/05/2015 Last OV with Owens Loffler, MD  R achilles tendinopathy, ongoing. Ongoing x 1 year.  Not good at doing any rehab. She basically has not done any rehabilitation at all, and she has not done the eccentric protocol that I gave her. She did do some topical nitroglycerin, but discontinued this after she felt like it was not helping all that much.  L ingrown toenail. She has now lost her toenail.  L leg with a tendon in the back of her leg will hurt some. When standing up it will bother her some.   L > R hip hurting the most. Primarily she is having left lateral hip pain without pain in the groin and no significant pain in the back. She also is having some right lateral hip pain, but this is to a much lesser extent compared to the left. She is quite weak and has not been doing any kind of physical exercise or rehabilitation for using these hip problems. GTB, inj  PT  Past Medical History, Surgical History, Social History, Family History, Problem List, Medications, and Allergies have been reviewed and updated if  relevant.  Patient Active Problem List   Diagnosis Date Noted  . Dyspnea on exertion 04/03/2014  . Post-nasal drip 04/03/2014  . Leg cramps 04/03/2014  . 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 Hearing loss 03/01/2013  . Herpes genitalia 04/28/2011  . Osteopenia 03/22/2011  . Hypertension 02/11/2011  . HYPERCHOLESTEROLEMIA 03/27/2009  . ALLERGIC RHINITIS CAUSE UNSPECIFIED 03/27/2009    No past medical history on file.  No past surgical history on file.  Social History   Social History  . Marital Status: Married    Spouse Name: N/A  . Number of Children: N/A  . Years of Education: N/A   Occupational History  . Not on file.   Social History Main Topics  . Smoking status: Never Smoker   . Smokeless tobacco: Never Used  . Alcohol Use: No  . Drug Use: No  . Sexual Activity: Not on file   Other Topics Concern  . Not on file   Social History Narrative   No living will , no HCPOA. Full code ( reviewed 2015, given packet)    Family History  Problem Relation Age of Onset  . Heart attack Mother   . Stroke Father   . Hyperlipidemia Father   . Dementia Father     No Known Allergies  Medication list reviewed and updated in full in [place].  GEN: No fevers, chills. Nontoxic. Primarily MSK c/o today. MSK: Detailed in the HPI GI:  tolerating PO intake without difficulty Neuro: No numbness, parasthesias, or tingling associated. Otherwise the pertinent positives of the ROS are noted above.   Objective:   BP 130/74 mmHg  Pulse 77  Temp(Src) 98.6 F (37 C) (Oral)  Ht 5\' 6"  (1.676 m)  Wt 170 lb 4 oz (77.225 kg)  BMI 27.49 kg/m2   GEN: Well-developed,well-nourished,in no acute distress; alert,appropriate and cooperative throughout examination HEENT: Normocephalic and atraumatic without obvious abnormalities. Ears, externally no deformities PULM: Breathing comfortably in no respiratory distress EXT: No clubbing, cyanosis, or edema PSYCH: Normally interactive.  Cooperative during the interview. Pleasant. Friendly and conversant. Not anxious or depressed appearing. Normal, full affect.  Foot: R Echymosis: no Edema: no ROM: full LE B Gait: heel toe, non-antalgic MT pain: no Callus pattern: none Lateral Mall: NT Medial Mall: NT Talus: NT Navicular: NT Cuboid: NT Calcaneous: NT Metatarsals: NT 5th MT: NT Phalanges: NT Achilles: Much improved. large nodule Plantar Fascia: NT Fat Pad: NT Peroneals: NT Post Tib: NT Great Toe: Nml motion Ant Drawer: neg ATFL: NT CFL: NT Deltoid: NT Sensation: intact   HIP EXAM: SIDE: L  ROM: Abduction, Flexion, Internal and External range of motion: rel preserved Pain with terminal IROM and EROM: mild GTB: notable L GTB SLR: NEG Knees: No effusion FABER: NT REVERSE FABER: NT, neg Piriformis: NT at direct palpation Str: flexion: 5/5 abduction: 4/5 adduction: 5/5 Strength testing non-tender  L toenail, 1st, growing back on toenail  Radiology: RIGHT HIP - COMPLETE 2+ VIEW  Comparison: None  Findings: There is mild osteoarthritis involving both hips. No acute fracture or subluxation identified.  No radiopaque foreign bodies or soft tissue calcifications.  IMPRESSION:  1. Mild bilateral hip osteoarthritis.   Original Report Authenticated By: Kerby Moors, M.D.  Assessment and Plan:   Trochanteric bursitis of left hip   I still think that the primary driver of this is all bursitis secondary primarily to Corrine hip weakness.  She has gotten much better, but she still has dramatic abductor weakness.  This is at best 4 out of 5, if not 3+/5.  A reorganized and focused her rehabilitation program to focus in on her hip deficits.  Without correcting these, I think that this will be in ongoing problem.  If she is still not having improvement in approximately 2 months, recommended follow-up again.  Signed,  Maud Deed. Keil Pickering, MD   Patient's Medications  New Prescriptions    No medications on file  Previous Medications   ATORVASTATIN (LIPITOR) 40 MG TABLET    TAKE 1 TABLET DAILY   BIOTIN 10 MG TABS    Take 1 tablet by mouth daily.   CALCIUM CARBONATE-VITAMIN D (CALCIUM 600+D) 600-400 MG-UNIT PER TABLET    Take 1 tablet by mouth daily.   CETIRIZINE (ZYRTEC) 10 MG TABLET    Take 10 mg by mouth as needed.     CRANBERRY PO    Take 1 capsule by mouth daily.   FAMCICLOVIR (FAMVIR) 250 MG TABLET    Take 1 tablet (250 mg total) by mouth daily.   GLUCOSAMINE HCL (GLUCOSAMINE PO)    Take 1 tablet by mouth daily.   LISINOPRIL-HYDROCHLOROTHIAZIDE (PRINZIDE,ZESTORETIC) 10-12.5 MG PER TABLET    TAKE 1 TABLET DAILY   NAPROXEN SODIUM (ANAPROX) 220 MG TABLET    Take 220 mg by mouth as needed.   Modified Medications   No medications on file  Discontinued Medications   No medications on file

## 2015-02-26 ENCOUNTER — Other Ambulatory Visit: Payer: Self-pay | Admitting: Family Medicine

## 2015-03-04 ENCOUNTER — Encounter: Payer: Self-pay | Admitting: Family Medicine

## 2015-03-04 ENCOUNTER — Ambulatory Visit (INDEPENDENT_AMBULATORY_CARE_PROVIDER_SITE_OTHER): Payer: Medicare Other | Admitting: Family Medicine

## 2015-03-04 VITALS — BP 130/74 | HR 80 | Temp 98.5°F | Ht 66.0 in | Wt 169.8 lb

## 2015-03-04 DIAGNOSIS — M7062 Trochanteric bursitis, left hip: Secondary | ICD-10-CM | POA: Diagnosis not present

## 2015-03-04 NOTE — Progress Notes (Signed)
Dr. Frederico Hamman T. Copland, MD, [facility] [place], [address] Phone: [phone] Fax: [phone]  03/04/2015  Patient: Patty Shelton, MRN: [id number], DOB: Oct 21, 1940, 74 y.o.  Primary Physician:  Eliezer Lofts, MD  Chief Complaint: Follow-up  Subjective:   Patty Shelton is a 74 y.o. very pleasant female patient who presents with the following:  F/u L hip:  L hip is improving, almost totally well.  R hip is about a 1/10 now  Both improving. Overall, she is doing dramatically better compared to before.  Her strength is improved dramatically, and she feels much better compared to her baseline previously.  She still does occasionally have some pain, but this is more on the order of 1 out of 10 level of pain on the pain scale.   02/04/2015 Last OV with Owens Loffler, MD  F/u multiple MSK issues.   Ongoing L hip pain. Went to physical therapy - core has improved in her strength.  Left lateral hip, now the right hip is bothering her some now also.  She is still having pain laterally, primarily at her trochanteric bursa.  She denies groin pain.  She is not really having any back pain.  I had her go over essentially the entirety of her home exercise program with me today in the office.  Achilles is improved.   01/05/2015 Last OV with Owens Loffler, MD  R achilles tendinopathy, ongoing. Ongoing x 1 year.  Not good at doing any rehab. She basically has not done any rehabilitation at all, and she has not done the eccentric protocol that I gave her. She did do some topical nitroglycerin, but discontinued this after she felt like it was not helping all that much.  L ingrown toenail. She has now lost her toenail.  L leg with a tendon in the back of her leg will hurt some. When standing up it will bother her some.   L > R hip hurting the most. Primarily she is having left lateral hip pain without pain in the groin and  no significant pain in the back. She also is having some right lateral hip pain, but this is to a much lesser extent compared to the left. She is quite weak and has not been doing any kind of physical exercise or rehabilitation for using these hip problems. GTB, inj  PT  Past Medical History, Surgical History, Social History, Family History, Problem List, Medications, and Allergies have been reviewed and updated if relevant.  Patient Active Problem List   Diagnosis Date Noted  . Dyspnea on exertion 04/03/2014  . Post-nasal drip 04/03/2014  . Leg cramps 04/03/2014  . 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 Hearing loss 03/01/2013  . Herpes genitalia 04/28/2011  . Osteopenia 03/22/2011  . Hypertension 02/11/2011  . HYPERCHOLESTEROLEMIA 03/27/2009  . ALLERGIC RHINITIS CAUSE UNSPECIFIED 03/27/2009    No past medical history on file.  No past surgical history on file.  Social History   Social History  . Marital Status: Married    Spouse Name: N/A  . Number of Children: N/A  . Years of Education: N/A   Occupational History  . Not on file.   Social History Main Topics  . Smoking status: Never Smoker   . Smokeless tobacco: Never Used  . Alcohol Use: No  . Drug Use: No  . Sexual Activity: Not on file   Other Topics Concern  . Not on file  Social History Narrative   No living will , no HCPOA. Full code ( reviewed 2015, given packet)    Family History  Problem Relation Age of Onset  . Heart attack Mother   . Stroke Father   . Hyperlipidemia Father   . Dementia Father     No Known Allergies  Medication list reviewed and updated in full in [place].  GEN: No fevers, chills. Nontoxic. Primarily MSK c/o today. MSK: Detailed in the HPI GI: tolerating PO intake without difficulty Neuro: No numbness, parasthesias, or tingling associated. Otherwise the pertinent positives of the ROS are noted above.   Objective:   BP 130/74 mmHg  Pulse 80  Temp(Src) 98.5 F (36.9 C)  (Oral)  Ht 5\' 6"  (1.676 m)  Wt 169 lb 12 oz (76.998 kg)  BMI 27.41 kg/m2   GEN: Well-developed,well-nourished,in no acute distress; alert,appropriate and cooperative throughout examination HEENT: Normocephalic and atraumatic without obvious abnormalities. Ears, externally no deformities PULM: Breathing comfortably in no respiratory distress EXT: No clubbing, cyanosis, or edema PSYCH: Normally interactive. Cooperative during the interview. Pleasant. Friendly and conversant. Not anxious or depressed appearing. Normal, full affect.  HIP EXAM: SIDE: L  ROM: Abduction, Flexion, Internal and External range of motion: rel preserved Pain with terminal IROM and EROM: mild GTB: notable L GTB SLR: NEG Knees: No effusion FABER: NT REVERSE FABER: NT, neg Piriformis: NT at direct palpation Str: flexion: 5/5 abduction: 5/5 adduction: 5/5 Strength testing non-tender  L toenail, 1st, growing back on toenail  Radiology: RIGHT HIP - COMPLETE 2+ VIEW  Comparison: None  Findings: There is mild osteoarthritis involving both hips. No acute fracture or subluxation identified.  No radiopaque foreign bodies or soft tissue calcifications.  IMPRESSION:  1. Mild bilateral hip osteoarthritis.   Original Report Authenticated By: Kerby Moors, M.D.  Assessment and Plan:   Trochanteric bursitis of left hip  She has done exceedingly well improving her core stability and pelvic strength over time.  Now she is dramatically less symptomatic.  Follow-up p.r.n.  Signed,  Maud Deed. Copland, MD   Patient's Medications  New Prescriptions   No medications on file  Previous Medications   ATORVASTATIN (LIPITOR) 40 MG TABLET    TAKE 1 TABLET DAILY   BIOTIN 10 MG TABS    Take 1 tablet by mouth daily.   CALCIUM CARBONATE-VITAMIN D (CALCIUM 600+D) 600-400 MG-UNIT PER TABLET    Take 1 tablet by mouth daily.   CETIRIZINE (ZYRTEC) 10 MG TABLET    Take 10 mg by mouth as needed.     CRANBERRY  PO    Take 1 capsule by mouth daily.   FAMCICLOVIR (FAMVIR) 250 MG TABLET    Take 1 tablet (250 mg total) by mouth daily.   GLUCOSAMINE HCL (GLUCOSAMINE PO)    Take 1 tablet by mouth daily.   LISINOPRIL-HYDROCHLOROTHIAZIDE (PRINZIDE,ZESTORETIC) 10-12.5 MG PER TABLET    TAKE 1 TABLET DAILY   NAPROXEN SODIUM (ANAPROX) 220 MG TABLET    Take 220 mg by mouth as needed.   Modified Medications   No medications on file  Discontinued Medications   No medications on file

## 2015-03-04 NOTE — Progress Notes (Signed)
Pre visit review using our clinic review tool, if applicable. No additional management support is needed unless otherwise documented below in the visit note. 

## 2015-03-09 ENCOUNTER — Ambulatory Visit: Payer: Medicare Other | Admitting: Family Medicine

## 2015-04-14 DIAGNOSIS — Z23 Encounter for immunization: Secondary | ICD-10-CM | POA: Diagnosis not present

## 2015-04-29 DIAGNOSIS — R030 Elevated blood-pressure reading, without diagnosis of hypertension: Secondary | ICD-10-CM | POA: Diagnosis not present

## 2015-04-29 DIAGNOSIS — J069 Acute upper respiratory infection, unspecified: Secondary | ICD-10-CM | POA: Diagnosis not present

## 2015-05-14 ENCOUNTER — Telehealth: Payer: Self-pay | Admitting: Family Medicine

## 2015-05-14 DIAGNOSIS — M8580 Other specified disorders of bone density and structure, unspecified site: Secondary | ICD-10-CM

## 2015-05-14 DIAGNOSIS — E7800 Pure hypercholesterolemia, unspecified: Secondary | ICD-10-CM

## 2015-05-14 NOTE — Telephone Encounter (Signed)
-----   Message from Marchia Bond sent at 05/12/2015  2:44 PM EST ----- Regarding: Cpx labs Fri 12/2, need orders, Thanks! :-) Please order  future cpx labs for pt's upcoming lab appt. Thanks Aniceto Boss

## 2015-05-15 ENCOUNTER — Other Ambulatory Visit (INDEPENDENT_AMBULATORY_CARE_PROVIDER_SITE_OTHER): Payer: Medicare Other

## 2015-05-15 DIAGNOSIS — E7800 Pure hypercholesterolemia, unspecified: Secondary | ICD-10-CM | POA: Diagnosis not present

## 2015-05-15 DIAGNOSIS — M8580 Other specified disorders of bone density and structure, unspecified site: Secondary | ICD-10-CM

## 2015-05-15 LAB — COMPREHENSIVE METABOLIC PANEL
ALBUMIN: 4.1 g/dL (ref 3.5–5.2)
ALT: 18 U/L (ref 0–35)
AST: 16 U/L (ref 0–37)
Alkaline Phosphatase: 80 U/L (ref 39–117)
BUN: 21 mg/dL (ref 6–23)
CALCIUM: 9.4 mg/dL (ref 8.4–10.5)
CHLORIDE: 103 meq/L (ref 96–112)
CO2: 29 meq/L (ref 19–32)
Creatinine, Ser: 1.07 mg/dL (ref 0.40–1.20)
GFR: 53.14 mL/min — AB (ref >60.00)
Glucose, Bld: 101 mg/dL — ABNORMAL HIGH (ref 70–99)
POTASSIUM: 4.4 meq/L (ref 3.5–5.1)
Sodium: 140 mEq/L (ref 135–145)
Total Bilirubin: 0.5 mg/dL (ref 0.2–1.2)
Total Protein: 7 g/dL (ref 6.0–8.3)

## 2015-05-15 LAB — LIPID PANEL
CHOL/HDL RATIO: 4
CHOLESTEROL: 154 mg/dL (ref 0–200)
HDL: 39.6 mg/dL (ref >39.00)
LDL CALC: 94 mg/dL (ref 0–99)
NonHDL: 114.32
TRIGLYCERIDES: 101 mg/dL (ref 0.0–149.0)
VLDL: 20.2 mg/dL (ref 0.0–40.0)

## 2015-05-15 LAB — VITAMIN D 25 HYDROXY (VIT D DEFICIENCY, FRACTURES): VITD: 28.35 ng/mL — ABNORMAL LOW (ref 30.00–100.00)

## 2015-05-22 ENCOUNTER — Ambulatory Visit (INDEPENDENT_AMBULATORY_CARE_PROVIDER_SITE_OTHER): Payer: Medicare Other | Admitting: Family Medicine

## 2015-05-22 ENCOUNTER — Encounter: Payer: Self-pay | Admitting: Family Medicine

## 2015-05-22 VITALS — BP 130/80 | HR 77 | Temp 98.5°F | Ht 65.5 in | Wt 172.0 lb

## 2015-05-22 DIAGNOSIS — Z7189 Other specified counseling: Secondary | ICD-10-CM | POA: Insufficient documentation

## 2015-05-22 DIAGNOSIS — Z0000 Encounter for general adult medical examination without abnormal findings: Secondary | ICD-10-CM | POA: Diagnosis not present

## 2015-05-22 DIAGNOSIS — I10 Essential (primary) hypertension: Secondary | ICD-10-CM | POA: Diagnosis not present

## 2015-05-22 DIAGNOSIS — E7800 Pure hypercholesterolemia, unspecified: Secondary | ICD-10-CM | POA: Diagnosis not present

## 2015-05-22 DIAGNOSIS — N183 Chronic kidney disease, stage 3 unspecified: Secondary | ICD-10-CM | POA: Insufficient documentation

## 2015-05-22 NOTE — Assessment & Plan Note (Signed)
Recommended against  NSAIDs as able.

## 2015-05-22 NOTE — Assessment & Plan Note (Signed)
Well controlled. Continue current medication.  

## 2015-05-22 NOTE — Patient Instructions (Addendum)
Get back to exercise 3-5 times week.  Continue healthy eating.  Vit D3 capsule OTC 400 IU -1000 IU daily.  Call to schedule mammogram on your own.

## 2015-05-22 NOTE — Assessment & Plan Note (Signed)
Well controlled. Continue current medication. Encouraged exercise, weight loss, healthy eating habits.  

## 2015-05-22 NOTE — Progress Notes (Signed)
HPI  I have personally reviewed the Medicare Annual Wellness questionnaire and have noted 1. The patient's medical and social history 2. Their use of alcohol, tobacco or illicit drugs 3. Their current medications and supplements 4. The patient's functional ability including ADL's, fall risks, home safety risks and hearing or visual             impairment. 5. Diet and physical activities 6. Evidence for depression or mood disorders 7.         Updated provider list Cognitive evaluation was performed and recorded on pt medicare questionnaire form. The patients weight, height, BMI and visual acuity have been recorded in the chart  I have made referrals, counseling and provided education to the patient based review of the above and I have provided the pt with a written personalized care plan for preventive services.   She feels well overall.   She is deconditioned, no regular exercise.  She has multiple other health issues.  Hypertension: Well controlled on lisinopril/HCTZ  BP Readings from Last 3 Encounters:  05/22/15 130/80  03/04/15 130/74  02/04/15 130/74  Using medication without problems or lightheadedness: none  Chest pain with exertion: None  Edema:None  Short of breath: None Average home BPs: not checking Wt Readings from Last 3 Encounters:  05/22/15 172 lb (78.019 kg)  03/04/15 169 lb 12 oz (76.998 kg)  02/04/15 170 lb 4 oz (77.225 kg)   Elevated Cholesterol: At goal LDL <130 on lipitor. Lab Results  Component Value Date   CHOL 154 05/15/2015   HDL 39.60 05/15/2015   LDLCALC 94 05/15/2015   LDLDIRECT 180.6 04/04/2011   TRIG 101.0 05/15/2015   CHOLHDL 4 05/15/2015  Diet compliance: moderate  Exercise: No longer.  Other complaints:  Non smoker.   Had creamer with sweetner prior to labs.. Glucose.  Low vit D despite supplement.  Review of Systems  Constitutional: Negative for fever and fatigue.  HENT: Negative for ear pain.  Eyes: Negative for  pain.  Respiratory: Negative for shortness of breath and wheezing. Negative for chest tightness.  Cardiovascular: Negative for chest pain, palpitations and leg swelling.  Gastrointestinal: Negative for abdominal pain.  Genitourinary: Negative for dysuria.  Has gradual hearing loss over time.  Objective:   Physical Exam  Constitutional: Vital signs are normal. She appears well-developed and well-nourished. She is cooperative. Non-toxic appearance. She does not appear ill. No distress.  HENT:  Head: Normocephalic.  Right Ear: Hearing, tympanic membrane, external ear and ear canal normal.  Left Ear: Hearing, tympanic membrane, external ear and ear canal normal.  Nose: Nose normal.  Eyes: Conjunctivae, EOM and lids are normal. Pupils are equal, round, and reactive to light. No foreign bodies found.  Neck: Trachea normal and normal range of motion. Neck supple. Carotid bruit is not present. No mass and no thyromegaly present.  Cardiovascular: Normal rate, regular rhythm, S1 normal, S2 normal, normal heart sounds and intact distal pulses. Exam reveals no gallop.  No murmur heard.  Pulmonary/Chest: Effort normal and breath sounds normal. No respiratory distress. She has no wheezes. She has no rhonchi. She has no rales.  Abdominal: Soft. Normal appearance and bowel sounds are normal. She exhibits no distension, no fluid wave, no abdominal bruit and no mass. There is no hepatosplenomegaly. There is no tenderness. There is no rebound, no guarding and no CVA tenderness. No hernia.  Genitourinary: Uterus normal. No breast swelling, tenderness, discharge or bleeding. Pelvic exam was performed with patient supine  DVE : NONE  Lymphadenopathy:  She has no cervical adenopathy.  She has no axillary adenopathy.  Neurological: She is alert. She has normal strength. No cranial nerve deficit or sensory deficit.  Skin: Skin is warm, dry and intact. No rash noted.  Psychiatric: Her speech is  normal and behavior is normal. Judgment normal. Her mood appears not anxious. Cognition and memory are normal. She does not exhibit a depressed mood.  Assessment & Plan:   Annual Medicare  The patient's preventative maintenance and recommended screening tests for an annual wellness exam were reviewed in full today.  Brought up to date unless services declined.  Counselled on the importance of diet, exercise, and its role in overall health and mortality.  The patient's FH and SH was reviewed, including their home life, tobacco status, and drug and alcohol status.   Vaccines: Uptodate with flu, zoster, PCV 23, 13, Tdap No pap indicated/DVE 2014. No cancer in family. No vaginal bleeding.  STOP DVE. Mammogram: 2014 , due every 2 years DXA: osteopenia 2012, nml vit D, plan repeat in 5 years.  Colon cancer screen: 12/2006, hem. only, repeat in 10 year.

## 2015-05-22 NOTE — Progress Notes (Signed)
Pre visit review using our clinic review tool, if applicable. No additional management support is needed unless otherwise documented below in the visit note. 

## 2015-05-25 ENCOUNTER — Other Ambulatory Visit: Payer: Self-pay | Admitting: Family Medicine

## 2015-05-26 ENCOUNTER — Other Ambulatory Visit: Payer: Self-pay | Admitting: Family Medicine

## 2015-06-11 ENCOUNTER — Encounter: Payer: Self-pay | Admitting: *Deleted

## 2015-06-12 ENCOUNTER — Other Ambulatory Visit: Payer: Self-pay | Admitting: Family Medicine

## 2015-09-17 DIAGNOSIS — Z961 Presence of intraocular lens: Secondary | ICD-10-CM | POA: Diagnosis not present

## 2015-09-17 DIAGNOSIS — H04123 Dry eye syndrome of bilateral lacrimal glands: Secondary | ICD-10-CM | POA: Diagnosis not present

## 2015-09-17 DIAGNOSIS — H43393 Other vitreous opacities, bilateral: Secondary | ICD-10-CM | POA: Diagnosis not present

## 2015-09-28 ENCOUNTER — Telehealth: Payer: Self-pay | Admitting: Family Medicine

## 2015-09-28 NOTE — Telephone Encounter (Signed)
Patient Name: Patty Shelton  DOB: August 12, 1940    Initial Comment Caller states she when she is getting stressed out, she starts getting leg pains in both leg - sometimes just her left leg.    Nurse Assessment  Nurse: Leilani Merl, RN, Heather Date/Time (Eastern Time): 09/28/2015 3:10:01 PM  Confirm and document reason for call. If symptomatic, describe symptoms. You must click the next button to save text entered. ---Caller states she when she is getting stressed out, she starts getting leg pains in both leg, it is normally in the front of her legs - sometimes just her left leg. She first started noticing this a month ago when she got stressed.  Has the patient traveled out of the country within the last 30 days? ---Not Applicable  Does the patient have any new or worsening symptoms? ---Yes  Will a triage be completed? ---Yes  Related visit to physician within the last 2 weeks? ---No  Does the PT have any chronic conditions? (i.e. diabetes, asthma, etc.) ---Yes  List chronic conditions. ---See MR  Is this a behavioral health or substance abuse call? ---No     Guidelines    Guideline Title Affirmed Question Affirmed Notes  Leg Pain [1] Caused by muscle cramps in the thigh, calf, or foot AND [2] present < 1 hour (brief, now gone) (all triage questions negative)    Final Disposition User   See PCP When Office is Open (within 3 days) [place], RN, Water quality scientist    Comments  Appt at 3:15 pm tomorrow with Dr. Diona Browner.   Referrals  REFERRED TO PCP OFFICE   Disagree/Comply: Comply

## 2015-09-28 NOTE — Telephone Encounter (Signed)
Pt has appt 09/29/15 at 3:15 with Dr Diona Browner.

## 2015-09-29 ENCOUNTER — Encounter: Payer: Self-pay | Admitting: Family Medicine

## 2015-09-29 ENCOUNTER — Ambulatory Visit (INDEPENDENT_AMBULATORY_CARE_PROVIDER_SITE_OTHER): Payer: Medicare Other | Admitting: Family Medicine

## 2015-09-29 VITALS — BP 130/94 | HR 73 | Temp 98.6°F | Ht 65.5 in | Wt 176.2 lb

## 2015-09-29 DIAGNOSIS — F4329 Adjustment disorder with other symptoms: Secondary | ICD-10-CM

## 2015-09-29 DIAGNOSIS — M79652 Pain in left thigh: Secondary | ICD-10-CM

## 2015-09-29 DIAGNOSIS — M79651 Pain in right thigh: Secondary | ICD-10-CM | POA: Insufficient documentation

## 2015-09-29 MED ORDER — ALPRAZOLAM 0.25 MG PO TABS: 0.2500 mg | ORAL_TABLET | Freq: Every day | ORAL | Status: DC | PRN

## 2015-09-29 NOTE — Assessment & Plan Note (Signed)
Really only with stress and anger. On mg and K. Increase water incase dehydration making cramp more likely. Stress reduction. If not improving and becoming continuous consider holding statin.

## 2015-09-29 NOTE — Progress Notes (Signed)
   Subjective:    Patient ID: Patty Shelton, female    DOB: 04/20/1941, 75 y.o.   MRN: [id number]  HPI  75 year old female with history of CKD, HTN, high chol  patient presents with new onset pain in thighs when under stress. Off and on in last several months. Occuring only when under lots of stress, most recent time was when she was angry at husband. Cramping pain. Most recent time in left leg, previous in bilateral legs. Moderate to seer pain .Marland Kitchen Lasts 3-5 minutes No swelling, no skin changes.  She is on atorvastatin 40 mg daily for cholesterol. No recent change.  She does take potassium and magnesium. She does not drink a lot of water.  She is interested in medication for anxiety or stress or a muscle relaxant, that she can use on a limited basis when under stress.  Social History /Family History/Past Medical History reviewed and updated if needed.  Review of Systems  Constitutional: Negative for fever and fatigue.  HENT: Negative for ear pain.   Eyes: Negative for pain.  Respiratory: Negative for chest tightness and shortness of breath.   Cardiovascular: Negative for chest pain, palpitations and leg swelling.  Gastrointestinal: Negative for abdominal pain.  Genitourinary: Negative for dysuria.       Objective:   Physical Exam  Constitutional: Vital signs are normal. She appears well-developed and well-nourished. She is cooperative.  Non-toxic appearance. She does not appear ill. No distress.  HENT:  Head: Normocephalic.  Right Ear: Hearing, tympanic membrane, external ear and ear canal normal. Tympanic membrane is not erythematous, not retracted and not bulging.  Left Ear: Hearing, tympanic membrane, external ear and ear canal normal. Tympanic membrane is not erythematous, not retracted and not bulging.  Nose: No mucosal edema or rhinorrhea. Right sinus exhibits no maxillary sinus tenderness and no frontal sinus tenderness. Left sinus exhibits no maxillary sinus tenderness  and no frontal sinus tenderness.  Mouth/Throat: Uvula is midline, oropharynx is clear and moist and mucous membranes are normal.  Eyes: Conjunctivae, EOM and lids are normal. Pupils are equal, round, and reactive to light. Lids are everted and swept, no foreign bodies found.  Neck: Trachea normal and normal range of motion. Neck supple. Carotid bruit is not present. No thyroid mass and no thyromegaly present.  Cardiovascular: Normal rate, regular rhythm, S1 normal, S2 normal, normal heart sounds, intact distal pulses and normal pulses.  Exam reveals no gallop and no friction rub.   No murmur heard. Pulmonary/Chest: Effort normal and breath sounds normal. No tachypnea. No respiratory distress. She has no decreased breath sounds. She has no wheezes. She has no rhonchi. She has no rales.  Abdominal: Soft. Normal appearance and bowel sounds are normal. There is no tenderness.  Neurological: She is alert. She has normal strength. She displays no atrophy. No cranial nerve deficit or sensory deficit. She exhibits normal muscle tone. Gait normal.  Skin: Skin is warm, dry and intact. No rash noted.  Psychiatric: Her speech is normal and behavior is normal. Judgment and thought content normal. Her mood appears not anxious. Cognition and memory are normal. She does not exhibit a depressed mood.          Assessment & Plan:

## 2015-09-29 NOTE — Patient Instructions (Addendum)
Increase water intake overall. If it recurs and stays, stay of cholesterol for a while to see if resolves. Can use low dose xanax as needed for anxiety in a limited fashion. Work on stress reduction and relaxation. If not improving and needing xanax regualry, make a appointment to discuss mood again.

## 2015-09-29 NOTE — Assessment & Plan Note (Signed)
Stress reduction and relaxation. Will try  Low dose xanax prn episodes.. If occuring regularly will need to consider safer long term treatment.

## 2015-09-29 NOTE — Progress Notes (Signed)
Pre visit review using our clinic review tool, if applicable. No additional management support is needed unless otherwise documented below in the visit note. 

## 2015-10-13 ENCOUNTER — Other Ambulatory Visit: Payer: Self-pay | Admitting: Family Medicine

## 2015-10-13 NOTE — Telephone Encounter (Signed)
Last office visit 09/29/2015.  Not on current medication list.  Refill?

## 2015-10-15 ENCOUNTER — Other Ambulatory Visit: Payer: Self-pay | Admitting: Family Medicine

## 2015-10-15 NOTE — Telephone Encounter (Signed)
Pt called to ck on status of ntg patch refill; I spoke with Amy at [facility] and rx did not go thru; Medication phoned to [facility] at [facility] as instructed.pt will pick up at [facility].

## 2015-11-10 ENCOUNTER — Encounter: Payer: Self-pay | Admitting: Family

## 2015-11-10 ENCOUNTER — Ambulatory Visit (INDEPENDENT_AMBULATORY_CARE_PROVIDER_SITE_OTHER): Payer: Medicare Other | Admitting: Family

## 2015-11-10 VITALS — BP 144/70 | HR 77 | Temp 98.2°F | Ht 67.0 in | Wt 174.4 lb

## 2015-11-10 DIAGNOSIS — R05 Cough: Secondary | ICD-10-CM

## 2015-11-10 DIAGNOSIS — R059 Cough, unspecified: Secondary | ICD-10-CM

## 2015-11-10 MED ORDER — HYDROCODONE-HOMATROPINE 5-1.5 MG/5ML PO SYRP: 5.0000 mL | ORAL_SOLUTION | Freq: Every evening | ORAL | Status: DC | PRN

## 2015-11-10 NOTE — Progress Notes (Signed)
Subjective:    Patient ID: Patty Shelton, female    DOB: 05/10/1941, 75 y.o.   MRN: [id number]   Patty Shelton is a 75 y.o. female who presents today for an acute visit.    HPI Comments: Patient here for evaluation of dry cough 1 week. Cough worse at night.  Has been taking an old azithromycin which she didn't take last fall, she is on her last day. She's been taking Mucinex, Delsym with some relief. Endorses chills. Denies fever. Notes husband is sick as well. Has seasonal allergies.  No past medical history on file. Allergies: Review of patient's allergies indicates no known allergies. Current Outpatient Prescriptions on File Prior to Visit  Medication Sig Dispense Refill  . ALPRAZolam (XANAX) 0.25 MG tablet Take 1 tablet (0.25 mg total) by mouth daily as needed for anxiety. 20 tablet 0  . atorvastatin (LIPITOR) 40 MG tablet TAKE 1 TABLET DAILY 90 tablet 3  . Biotin 10 MG TABS Take 1 tablet by mouth daily.    . Calcium Carbonate-Vitamin D (CALCIUM 600+D) 600-400 MG-UNIT per tablet Take 1 tablet by mouth daily.    . cetirizine (ZYRTEC) 10 MG tablet Take 10 mg by mouth as needed.      Marland Kitchen CRANBERRY PO Take 1 capsule by mouth daily.    . famciclovir (FAMVIR) 250 MG tablet TAKE 1 TABLET DAILY 90 tablet 3  . Glucosamine HCl (GLUCOSAMINE PO) Take 1 tablet by mouth daily.    Marland Kitchen lisinopril-hydrochlorothiazide (PRINZIDE,ZESTORETIC) 10-12.5 MG tablet TAKE 1 TABLET DAILY 90 tablet 1  . naproxen sodium (ANAPROX) 220 MG tablet Take 220 mg by mouth as needed.     . nitroGLYCERIN (NITRODUR - DOSED IN MG/24 HR) 0.2 mg/hr patch APPLY 1/4 OF A PATCH TO THE AFFECTED AREA AND CHANGE EVERY 24 HOURS AS DIRECTED. 30 patch 0   No current facility-administered medications on file prior to visit.    Social History  Substance Use Topics  . Smoking status: Never Smoker   . Smokeless tobacco: Never Used  . Alcohol Use: No    Review of Systems  Constitutional: Negative for fever and chills.  HENT:  Positive for postnasal drip. Negative for congestion, sinus pressure and sore throat.   Respiratory: Positive for cough. Negative for shortness of breath and wheezing.   Cardiovascular: Negative for chest pain and palpitations.  Gastrointestinal: Negative for nausea and vomiting.      Objective:    BP 144/70 mmHg  Pulse 77  Temp(Src) 98.2 F (36.8 C)  Ht 5\' 7"  (1.702 m)  Wt 174 lb 6.4 oz (79.107 kg)  BMI 27.31 kg/m2  SpO2 95%   Physical Exam  Constitutional: She appears well-developed and well-nourished.  HENT:  Head: Normocephalic and atraumatic.  Right Ear: Hearing, tympanic membrane, external ear and ear canal normal. No drainage, swelling or tenderness. No foreign bodies. Tympanic membrane is not erythematous and not bulging. No middle ear effusion. No decreased hearing is noted.  Left Ear: Hearing, tympanic membrane, external ear and ear canal normal. No drainage, swelling or tenderness. No foreign bodies. Tympanic membrane is not erythematous and not bulging.  No middle ear effusion. No decreased hearing is noted.  Nose: Nose normal. No rhinorrhea. Right sinus exhibits no maxillary sinus tenderness and no frontal sinus tenderness. Left sinus exhibits no maxillary sinus tenderness and no frontal sinus tenderness.  Mouth/Throat: Uvula is midline, oropharynx is clear and moist and mucous membranes are normal. No oropharyngeal exudate, posterior oropharyngeal edema, posterior oropharyngeal  erythema or tonsillar abscesses.  Eyes: Conjunctivae are normal.  Cardiovascular: Regular rhythm, normal heart sounds and normal pulses.   Pulmonary/Chest: Effort normal and breath sounds normal. She has no wheezes. She has no rhonchi. She has no rales.  Lymphadenopathy:       Head (right side): No submental, no submandibular, no tonsillar, no preauricular, no posterior auricular and no occipital adenopathy present.       Head (left side): No submental, no submandibular, no tonsillar, no  preauricular, no posterior auricular and no occipital adenopathy present.    She has no cervical adenopathy.  Neurological: She is alert.  Skin: Skin is warm and dry.  Psychiatric: She has a normal mood and affect. Her speech is normal and behavior is normal. Thought content normal.  Vitals reviewed.      Assessment & Plan:   1. Cough Viral URI. No evidence of bacterial infection at this time. Patient and I agreed on conservative therapy and delayed antibiotics at this time.  - HYDROcodone-homatropine (HYCODAN) 5-1.5 MG/5ML syrup; Take 5 mLs by mouth at bedtime as needed for cough.  Dispense: 120 mL; Refill: 0    I am having Ms. Rodier maintain her cetirizine, Calcium Carbonate-Vitamin D, Biotin, naproxen sodium, CRANBERRY PO, Glucosamine HCl (GLUCOSAMINE PO), atorvastatin, lisinopril-hydrochlorothiazide, famciclovir, ALPRAZolam, and nitroGLYCERIN.   No orders of the defined types were placed in this encounter.     Start medications as prescribed and explained to patient on After Visit Summary ( AVS). Risks, benefits, and alternatives of the medications and treatment plan prescribed today were discussed, and patient expressed understanding.   Education regarding symptom management and diagnosis given to patient.   Follow-up:Plan follow-up and return precautions given if any worsening symptoms or change in condition.   Continue to follow with Eliezer Lofts, MD for routine health maintenance.   [name] and I agreed with plan.   Mable Paris, FNP

## 2015-11-10 NOTE — Patient Instructions (Addendum)
Please take cough medication at night only as needed. As we discussed, I do not recommend dosing throughout the day as coughing is a protective mechanism . It also helps to break up thick mucous.  Do not take cough suppressants with alcohol as can lead to trouble breathing. Advise caution if taking cough suppressant and operating machinery ( i.e driving a car) as you may feel very tired.    Increase intake of clear fluids. Congestion is best treated by hydration, when mucus is wetter, it is thinner, less sticky, and easier to expel from the body, either through coughing up drainage, or by blowing your nose.   Get plenty of rest.   Use saline nasal drops and blow your nose frequently. Run a humidifier at night and elevate the head of the bed. Vicks Vapor rub will help with congestion and cough. Steam showers and sinus massage for congestion.   Use Acetaminophen or Ibuprofen as needed for fever or pain. Avoid second hand smoke. Even the smallest exposure will worsen symptoms.   Over the counter medications you can try include Delsym for cough, a decongestant for congestion, and Mucinex or Robitussin as an expectorant. Be sure to just get the plain Mucinex or Robitussin that just has one medication (Guaifenesen). We don't recommend the combination products. Note, be sure to drink two glasses of water with each dose of Mucinex as the medication will not work well without adequate hydration.   You can also try a teaspoon of honey to see if this will help reduce cough. Throat lozenges can sometimes be beneficial as well.    This illness will typically last 7 - 10 days.   Please follow up with our clinic if you develop a fever greater than 101 F, symptoms worsen, or do not resolve in the next week.    

## 2015-11-18 ENCOUNTER — Telehealth: Payer: Self-pay | Admitting: Family Medicine

## 2015-11-18 NOTE — Telephone Encounter (Signed)
PLEASE NOTE: All timestamps contained within this report are represented as [place] Standard Time. CONFIDENTIALTY NOTICE: This fax transmission is intended only for the addressee. It contains information that is legally privileged, confidential or otherwise protected from use or disclosure. If you are not the intended recipient, you are strictly prohibited from reviewing, disclosing, copying using or disseminating any of this information or taking any action in reliance on or regarding this information. If you have received this fax in error, please notify us immediately by telephone so that we can arrange for its return to [place]. Phone: [phone], Toll-Free: [phone], Fax: [phone] Page: 1 of 3 Call Id: [id number] [place] Patient Name: Patty Shelton Gender: Female DOB: 21-Nov-1940 Age: 75 Y 36 M 23 D Return Phone Number: [phone] (Primary) Address: City/State/Zip: [place] [address] Client [facility] - Client Client Site [place] - Day Physician Eliezer Lofts - MD Contact Type Call Who Is Calling Patient / Member / Family / Caregiver Call Type Triage / Clinical Relationship To Patient Self Return Phone Number [phone] (Primary) Chief Complaint Sore Throat Reason for Call Symptomatic / Request for Health Information Initial Comment she has red splotches in the back of her throat, sore throat, no fever. just getting over a cold. been exposed to meningitis from her granddaughter Appointment Disposition EMR Appointment Not Necessary Info pasted into Epic Yes PreDisposition Home Care Translation No Nurse Assessment Nurse: Christel Mormon, RN, Levada Dy Date/Time (Eastern Time): 11/18/2015 3:50:24 PM Confirm and document reason for call. If symptomatic, describe symptoms. You must click the next button to save text entered. ---Patty Shelton states  she has red splotches in the back of her throat, sore throat, no fever. She is just getting over a cold Her granddaughter was admitted on Saturday for viral meningitis and discharged on Tuesday and lives with her. Has the patient traveled out of the country within the last 30 days? ---No Does the patient have any new or worsening symptoms? ---Yes Will a triage be completed? ---Yes Related visit to physician within the last 2 weeks? ---No Does the PT have any chronic conditions? (i.e. diabetes, asthma, etc.) ---No Is this a behavioral health or substance abuse call? ---No Guidelines Guideline Title Affirmed Question Affirmed Notes Nurse Date/Time (Eastern Time) Meningitis Exposure [1] Viral meningitis EXPOSURE (Close Contact) AND [2] NO symptoms [place], RN, Levada Dy 11/18/2015 3:52:22 PM Sore Throat [1] Sore throat with cough/cold symptoms AND [2] present < 5 [place], RN, Levada Dy 11/18/2015 3:56:57 PM PLEASE NOTE: All timestamps contained within this report are represented as [place] Standard Time. CONFIDENTIALTY NOTICE: This fax transmission is intended only for the addressee. It contains information that is legally privileged, confidential or otherwise protected from use or disclosure. If you are not the intended recipient, you are strictly prohibited from reviewing, disclosing, copying using or disseminating any of this information or taking any action in reliance on or regarding this information. If you have received this fax in error, please notify us immediately by telephone so that we can arrange for its return to [place]. Phone: [phone], Toll-Free: [phone], Fax: [phone] Page: 2 of 3 Call Id: [id number] Guidelines Guideline Title Affirmed Question Affirmed Notes Nurse Date/Time Eilene Ghazi Time) days (all triage questions negative) Disp. Time Eilene Ghazi Time) Disposition Final User 11/18/2015 3:56:35 [place], RN, Marin Shutter Understands: Yes Disagree/Comply:  Comply Caller Understands: Yes Disagree/Comply: Comply Care Advice Given Per Guideline HOME  CARE: You should be able to treat this at home. REASSURANCE: * You told me that you were exposed to viral meningitis, but you do not have any symptoms. * Viral meningitis is not very contagious. * The type of meningitis spread by a mosquito is not contagious at all. There is no human-to-human transmission. * You don't need to see your doctor or do anything special for this exposure. VIRAL MENINGITIS - GENERAL INFORMATION: * DEFINITION: Viral meningitis is a viral infection of the membranes of the brain and spinal cord. It is also called aseptic meningitis. * SYMPTOMS: The most common symptoms are headache, stiff neck, photophobia (eyes sensitive to the light) and fever. Other symptoms may include muscle aches, diarrhea, or cough. * CAUSE: The most common viruses are enteroviruses. Mumps virus is also a common cause. The most common mosquitoborne virus that causes viral meningitis is [place] Nile Virus. * DIAGNOSIS: A lumbar puncture is important and is the only way to reliably distinguish viral meningitis from bacterial meningitis in a timely manner. Laboratory analysis of the spinal fluid will show abnormal results (e.g., increased white blood cells). A culture of the fluid will show no bacterial growth. * SEASON: Viral meningitis occurs most commonly in the summer and fall. * TRANSMISSION: The virus can be transmitted in diarrhea stool or via respiratory secretions. * INCUBATION PERIOD: 3 to 6 days. * TREATMENT: Antibiotics are not helpful. Patients do not always need to be hospitalized. Symptoms of headache and fever can be treated with acetaminophen (e.g., Tylenol), ibuprofen (e.g., Advil, Motrin), or naproxen (e.g., Aleve). * EXPECTED COURSE: Most individuals recover fully in less than 1 week. * COMPLICATIONS: There are usually no complications. VIRAL MENINGITIS - PREVENTION: * Avoid close contact  with anyone who has viral meningitis. This is the best way to prevent getting this infection. * There is no anti-viral medication or antibiotic you can take after being exposed to viral meningitis. * There is no vaccine that prevents viral meningitis. CALL BACK IF: * Fever or other symptoms occur * You have other questions or concerns CARE ADVICE given per Meningitis Exposure (Adult) guideline. HOME CARE: You should be able to treat this at home. REASSURANCE: * Most mild sore throats and intermittent sore throats are just part of a cold and can be treated at home. * The presence of a cough, hoarseness or nasal symptoms points to a viral infection as the cause of the sore throat. * Dry air and smoking can cause or aggravate a sore throat. SORE THROAT - For relief of sore throat: * Sip warm chicken broth or apple juice. * Suck on hard candy or a throat lozenge (OTC). * Gargle with warm salt water four times a day. To make salt water, put 1/2 teaspoon of salt in 8 oz (240 ml) of warm water. * Avoid cigarette smoke. PAIN OR FEVER MEDICINES: * For pain and fever relief, take acetaminophen or ibuprofen. * Treat fevers above 101 F (38.3 C). * The goal of fever therapy is to bring the fever down to a comfortable level. Remember that fever medicine usually lowers fever 2-3 F (1-1.5 C). SOFT DIET: * Eat a soft diet. * Cold drinks, popsicles, and milk shakes are especially good. Avoid citrus fruits. DRINK PLENTY LIQUIDS: * Drink plenty of liquids. This is important to prevent dehydation. * A healthy adult should drink 8 cups (240 ml) or more of liquid each day. * How can you tell if you are drinking enough liquids? The goal is  to keep the urine clear or light-yellow in color. If your urine is bright yellow or dark yellow, you are probably not drinking enough liquids. * Caution: Some medical problems require fluid restriction. NO ANTIBIOTICS: Antibiotics are not helpful for viral sore throats. EXPECTED  COURSE: Sore throats with viral illnesses usually last 3 or 4 days. CALL BACK IF: * Sore throat is the only symptom AND lasts over 48 hours * Sore PLEASE NOTE: All timestamps contained within this report are represented as [place] Standard Time. CONFIDENTIALTY NOTICE: This fax transmission is intended only for the addressee. It contains information that is legally privileged, confidential or otherwise protected from use or disclosure. If you are not the intended recipient, you are strictly prohibited from reviewing, disclosing, copying using or disseminating any of this information or taking any action in reliance on or regarding this information. If you have received this fax in error, please notify us immediately by telephone so that we can arrange for its return to [place]. Phone: [phone], Toll-Free: [phone], Fax: [phone] Page: 3 of 3 Call Id: [id number] Care Advice Given Per Guideline throat with cold symptoms, and sore throat lasts over 5 days * Fever lasts over 3 days * You become worse CARE ADVICE given per Sore Throat (Adult) guideline.

## 2015-11-18 NOTE — Telephone Encounter (Signed)
Patient Name: Patty Shelton  DOB: 1941/05/14    Initial Comment she has red splotches in the back of her throat, sore throat, no fever. just getting over a cold. been exposed to meningitis from her granddaughter    Nurse Assessment  Nurse: Christel Mormon, RN, Levada Dy Date/Time (Eastern Time): 11/18/2015 3:50:24 PM  Confirm and document reason for call. If symptomatic, describe symptoms. You must click the next button to save text entered. ---[name] states she has red splotches in the back of her throat, sore throat, no fever. She is just getting over a cold Her granddaughter was admitted on Saturday for viral meningitis and discharged on Tuesday and lives with her.  Has the patient traveled out of the country within the last 30 days? ---No  Does the patient have any new or worsening symptoms? ---Yes  Will a triage be completed? ---Yes  Related visit to physician within the last 2 weeks? ---No  Does the PT have any chronic conditions? (i.e. diabetes, asthma, etc.) ---No  Is this a behavioral health or substance abuse call? ---No     Guidelines    Guideline Title Affirmed Question Affirmed Notes  Meningitis Exposure [1] Viral meningitis EXPOSURE (Close Contact) AND [2] NO symptoms   Sore Throat [1] Sore throat with cough/cold symptoms AND [2] present < 5 days (all triage questions negative)    Final Disposition User   [place], RN, Levada Dy    Disagree/Comply: Comply

## 2015-11-22 ENCOUNTER — Other Ambulatory Visit: Payer: Self-pay | Admitting: Family Medicine

## 2015-11-25 ENCOUNTER — Ambulatory Visit (INDEPENDENT_AMBULATORY_CARE_PROVIDER_SITE_OTHER): Payer: Medicare Other | Admitting: Family Medicine

## 2015-11-25 ENCOUNTER — Encounter: Payer: Self-pay | Admitting: Family Medicine

## 2015-11-25 VITALS — BP 124/68 | HR 77 | Temp 99.2°F | Ht 67.0 in | Wt 174.5 lb

## 2015-11-25 DIAGNOSIS — J029 Acute pharyngitis, unspecified: Secondary | ICD-10-CM

## 2015-11-25 DIAGNOSIS — B9789 Other viral agents as the cause of diseases classified elsewhere: Secondary | ICD-10-CM

## 2015-11-25 DIAGNOSIS — J069 Acute upper respiratory infection, unspecified: Secondary | ICD-10-CM | POA: Diagnosis not present

## 2015-11-25 LAB — POCT RAPID STREP A (OFFICE): Rapid Strep A Screen: NEGATIVE

## 2015-11-25 MED ORDER — AMOXICILLIN-POT CLAVULANATE 875-125 MG PO TABS: 1.0000 | ORAL_TABLET | Freq: Two times a day (BID) | ORAL | Status: DC

## 2015-11-25 NOTE — Patient Instructions (Signed)
Drink plenty of fluids, use zytec and nasal saline, gargle with warm salt water for your throat.  This should gradually improve.   If not, then start augmentin.  Take care.  Let us know if you have other concerns.

## 2015-11-25 NOTE — Progress Notes (Signed)
Pre visit review using our clinic review tool, if applicable. No additional management support is needed unless otherwise documented below in the visit note.  Seen about 3 weeks ago at outside clinic.  Sick contacts in initially.  Started taking mucinex and delsym, advil, etc, otc cold meds.  Wasn't getting better- seen at outside clinic at that point.  She was advised to give it more time.  In the last week, started having more ST.  She never got better in the meantime.  ST continues now, feels raw.  She initially had some red spots in the throat, not white spots.  Cough is ongoing.  Sputum is minimal.  Some post nasal gtt.  No fevers.  No ear pain.  Some rhinorrhea.  No facial pain.  Fatigued.   Had been off zyrtec recently.    Meds, vitals, and allergies reviewed.   ROS: Per HPI unless specifically indicated in ROS section   GEN: nad, alert and oriented HEENT: mucous membranes moist, tm w/o erythema, nasal exam w/o erythema, clear discharge noted,  OP with cobblestoning, nax sinuses slightly ttp B NECK: supple w/o LA CV: rrr.   PULM: ctab, no inc wob EXT: no edema

## 2015-11-25 NOTE — Assessment & Plan Note (Addendum)
Nontoxic, d/w pt. About ddx.  Restart zyrtec, use nasal saline, if not better, then start augmentin.  See AVS.  She agrees.  Okay for outpatient f/u.  Ctab. RST neg.

## 2015-12-30 ENCOUNTER — Encounter: Payer: Self-pay | Admitting: Family Medicine

## 2015-12-30 ENCOUNTER — Telehealth: Payer: Self-pay

## 2015-12-30 ENCOUNTER — Ambulatory Visit (INDEPENDENT_AMBULATORY_CARE_PROVIDER_SITE_OTHER): Payer: Medicare Other | Admitting: Family Medicine

## 2015-12-30 VITALS — BP 140/72 | HR 72 | Temp 98.5°F | Ht 65.5 in | Wt 171.8 lb

## 2015-12-30 DIAGNOSIS — M7661 Achilles tendinitis, right leg: Secondary | ICD-10-CM | POA: Diagnosis not present

## 2015-12-30 MED ORDER — NITROGLYCERIN 0.2 MG/HR TD PT24: MEDICATED_PATCH | TRANSDERMAL | Status: DC

## 2015-12-30 NOTE — Telephone Encounter (Signed)
[place] called to see if Dr Lorelei Pont wanted pt to have the 0.1 mg patch instead of cutting the 0.2 patch in half.  I advised [facility] to fill rx as written by Dr Lorelei Pont; in AVS there was a note to pt after 1 week to try to go up to 1 full patch q 24 h. [facility] voiced understanding.

## 2015-12-30 NOTE — Progress Notes (Signed)
Pre visit review using our clinic review tool, if applicable. No additional management support is needed unless otherwise documented below in the visit note. 

## 2015-12-30 NOTE — Patient Instructions (Signed)
Increase NTG patch to 1/2 a patch.  After a week try to go up to 1 full patch every 24 hours Vary placement daily

## 2015-12-30 NOTE — Progress Notes (Signed)
Dr. Frederico Hamman T. Pamla Pangle, MD, [facility] [place], [address] Phone: [phone] Fax: [phone]  12/30/2015  Patient: Patty Shelton, MRN: [id number], DOB: 07-30-1940, 75 y.o.  Primary Physician:  Eliezer Lofts, MD   Chief Complaint  Patient presents with  . Foot Pain    Right Heel   Subjective:   Pleasant patient who presents with an almost 2 year history of posterior heel pain.  No occult, abrupt onset. Has been more insidious in character. There is a dull ache present and worse with activity:  Prior home rehab: intermittent, but decent within the last 3-4 months. Prior meds: NSAIDS, intermittent use with nitroglycerin, and improved over the last 3-4 months.  Some difficulty with using a quarter of a patch.  Due to size. Orthosis / Braces: none  The PMH, PSH, Social History, Family History, Medications, and allergies have been reviewed in [facility], and have been updated if relevant.  Patient Active Problem List   Diagnosis Date Noted  . URI (upper respiratory infection) 11/25/2015  . Stress and adjustment reaction 09/29/2015  . Bilateral thigh pain 09/29/2015  . CKD (chronic kidney disease) stage 3, GFR 30-59 ml/min 05/22/2015  . Advance directive discussed with patient 05/22/2015  . Hearing loss 03/01/2013  . Herpes genitalia 04/28/2011  . Osteopenia 03/22/2011  . Hypertension 02/11/2011  . HYPERCHOLESTEROLEMIA 03/27/2009  . ALLERGIC RHINITIS CAUSE UNSPECIFIED 03/27/2009    No past medical history on file.  No past surgical history on file.  Social History   Social History  . Marital Status: Married    Spouse Name: N/A  . Number of Children: N/A  . Years of Education: N/A   Occupational History  . Not on file.   Social History Main Topics  . Smoking status: Never Smoker   . Smokeless tobacco: Never Used  . Alcohol Use: No  . Drug Use: No  . Sexual Activity: Not on file   Other Topics Concern    . Not on file   Social History Narrative   No living will , no HCPOA. Full code ( reviewed 2015, given packet)    Family History  Problem Relation Age of Onset  . Heart attack Mother   . Stroke Father   . Hyperlipidemia Father   . Dementia Father     Allergies  Allergen Reactions  . Lipitor [Atorvastatin] Other (See Comments)    Aches at 40mg  dose    Medication list reviewed and updated in full in [place].  GEN: No fevers, chills. Nontoxic. Primarily MSK c/o today. MSK: Detailed in the HPI GI: tolerating PO intake without difficulty Neuro: No numbness, parasthesias, or tingling associated. Otherwise the pertinent positives of the ROS are noted above.   Objective:   Blood pressure 140/72, pulse 72, temperature 98.5 F (36.9 C), temperature source Oral, height 5' 5.5" (1.664 m), weight 171 lb 12 oz (77.905 kg).   GEN: Well-developed,well-nourished,in no acute distress; alert,appropriate and cooperative throughout examination HEENT: Normocephalic and atraumatic without obvious abnormalities. Ears, externally no deformities PULM: Breathing comfortably in no respiratory distress EXT: No clubbing, cyanosis, or edema PSYCH: Normally interactive. Cooperative during the interview. Pleasant. Friendly and conversant. Not anxious or depressed appearing. Normal, full affect.  Foot: R Echymosis: no Edema: no ROM: full LE B Gait: heel toe, non-antalgic MT pain: no Callus pattern: none Lateral Mall: NT Medial Mall: NT Talus: NT Navicular: NT Cuboid: NT Calcaneous: NT Metatarsals: NT 5th MT:  NT Phalanges: NT Achilles: PAINFUL TO PALPATE AT INSERTION ON R, large, painful nodule Plantar Fascia: NT Fat Pad: NT Peroneals: NT Post Tib: NT Great Toe: Nml motion Ant Drawer: neg ATFL: NT CFL: NT Deltoid: NT Sensation: intact  Radiology: No results found.  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  This is uncommon.  Discussed again with the patient.  Reviewed different options with her.  We are going to do a fairly aggressive rehabilitation protocol with ramped up dosing of nitroglycerin patches.  I went over the classic operative technique for refractory Achilles tendinopathy, and she is not ready for this at this point.  Additionally, I have given the patient the program emphasizing eccentric overloading detailed in the instructions based on Dr. Trudi Ida work and protocols.  Follow-up: Return in about 2 months (around 03/01/2016).   Patient Instructions  Increase NTG patch to 1/2 a patch.  After a week try to go up to 1 full patch every 24 hours Vary placement daily     Signed,  Frederico Hamman T. Wilbern Pennypacker, MD   Patient's Medications  New Prescriptions   No medications on file  Previous Medications   ALPRAZOLAM (XANAX) 0.25 MG TABLET    Take 1 tablet (0.25 mg total) by mouth daily as needed for anxiety.   BIOTIN 10 MG TABS    Take 1 tablet by mouth daily.   CALCIUM CARBONATE-VITAMIN D (CALCIUM 600+D) 600-400 MG-UNIT PER TABLET    Take 1 tablet by mouth daily.   CETIRIZINE (ZYRTEC) 10 MG TABLET    Take 10 mg by mouth as needed.     CRANBERRY PO    Take 1 capsule by mouth daily.   FAMCICLOVIR (FAMVIR) 250 MG TABLET    TAKE 1 TABLET DAILY   LISINOPRIL-HYDROCHLOROTHIAZIDE (PRINZIDE,ZESTORETIC) 10-12.5 MG TABLET    TAKE 1 TABLET DAILY   NAPROXEN SODIUM (ANAPROX) 220 MG TABLET    Take 220 mg by mouth as needed.   Modified Medications   Modified Medication Previous Medication   NITROGLYCERIN (NITRODUR - DOSED IN MG/24 HR) 0.2 MG/HR PATCH nitroGLYCERIN (NITRODUR - DOSED IN MG/24 HR) 0.2 mg/hr patch      Apply 1/2 of a patch to the affected area and change every 24 hours (re: tendinopathy)    APPLY 1/4 OF A PATCH TO THE AFFECTED AREA AND CHANGE EVERY 24 HOURS AS DIRECTED.  Discontinued Medications   AMOXICILLIN-CLAVULANATE (AUGMENTIN) 875-125 MG TABLET     Take 1 tablet by mouth 2 (two) times daily.   DEXTROMETHORPHAN POLISTIREX (DELSYM PO)    Take by mouth as needed.   GLUCOSAMINE HCL (GLUCOSAMINE PO)    Take 1 tablet by mouth daily.   HYDROCODONE-HOMATROPINE (HYCODAN) 5-1.5 MG/5ML SYRUP    Take 5 mLs by mouth at bedtime as needed for cough.

## 2016-02-24 ENCOUNTER — Ambulatory Visit: Payer: Medicare Other | Admitting: Family Medicine

## 2016-02-29 ENCOUNTER — Ambulatory Visit (INDEPENDENT_AMBULATORY_CARE_PROVIDER_SITE_OTHER): Payer: Medicare Other | Admitting: Family Medicine

## 2016-02-29 ENCOUNTER — Encounter: Payer: Self-pay | Admitting: Family Medicine

## 2016-02-29 VITALS — BP 138/66 | HR 74 | Temp 98.6°F | Ht 65.5 in | Wt 171.8 lb

## 2016-02-29 DIAGNOSIS — M7661 Achilles tendinitis, right leg: Secondary | ICD-10-CM | POA: Diagnosis not present

## 2016-02-29 NOTE — Progress Notes (Signed)
Dr. Frederico Hamman T. Keanna Tugwell, MD, [facility] Medicine [place], [address] Phone: [phone] Fax: [phone]  02/29/2016  Patient: Patty Shelton, MRN: [id number], DOB: Jul 10, 1940, 75 y.o.  Primary Physician:  Eliezer Lofts, MD   Chief Complaint  Patient presents with  . Follow-up    Right Heel   Subjective:   Pain is improving and doing better.  She has been compliant with her NTG patches at 1/2 a patch and doing eccentrics. Nodule still there, but decreased pain  12/30/2015 Last OV with Owens Loffler, MD  Pleasant patient who presents with an almost 2 year history of posterior heel pain.  No occult, abrupt onset. Has been more insidious in character. There is a dull ache present and worse with activity:  Prior home rehab: intermittent, but decent within the last 3-4 months. Prior meds: NSAIDS, intermittent use with nitroglycerin, and improved over the last 3-4 months.  Some difficulty with using a quarter of a patch.  Due to size. Orthosis / Braces: none  The PMH, PSH, Social History, Family History, Medications, and allergies have been reviewed in [facility], and have been updated if relevant.  Patient Active Problem List   Diagnosis Date Noted  . URI (upper respiratory infection) 11/25/2015  . Stress and adjustment reaction 09/29/2015  . Bilateral thigh pain 09/29/2015  . CKD (chronic kidney disease) stage 3, GFR 30-59 ml/min 05/22/2015  . Advance directive discussed with patient 05/22/2015  . Hearing loss 03/01/2013  . Herpes genitalia 04/28/2011  . Osteopenia 03/22/2011  . Hypertension 02/11/2011  . HYPERCHOLESTEROLEMIA 03/27/2009  . ALLERGIC RHINITIS CAUSE UNSPECIFIED 03/27/2009    No past medical history on file.  No past surgical history on file.  Social History   Social History  . Marital status: Married    Spouse name: N/A  . Number of children: N/A  . Years of education: N/A   Occupational History  .  Not on file.   Social History Main Topics  . Smoking status: Never Smoker  . Smokeless tobacco: Never Used  . Alcohol use No  . Drug use: No  . Sexual activity: Not on file   Other Topics Concern  . Not on file   Social History Narrative   No living will , no HCPOA. Full code ( reviewed 2015, given packet)    Family History  Problem Relation Age of Onset  . Heart attack Mother   . Stroke Father   . Hyperlipidemia Father   . Dementia Father     Allergies  Allergen Reactions  . Lipitor [Atorvastatin] Other (See Comments)    Aches at 40mg  dose    Medication list reviewed and updated in full in [place] [facility].  GEN: No fevers, chills. Nontoxic. Primarily MSK c/o today. MSK: Detailed in the HPI GI: tolerating PO intake without difficulty Neuro: No numbness, parasthesias, or tingling associated. Otherwise the pertinent positives of the ROS are noted above.   Objective:   Blood pressure 138/66, pulse 74, temperature 98.6 F (37 C), temperature source Oral, height 5' 5.5" (1.664 m), weight 171 lb 12 oz (77.9 kg).   GEN: Well-developed,well-nourished,in no acute distress; alert,appropriate and cooperative throughout examination HEENT: Normocephalic and atraumatic without obvious abnormalities. Ears, externally no deformities PULM: Breathing comfortably in no respiratory distress EXT: No clubbing, cyanosis, or edema PSYCH: Normally interactive. Cooperative during the interview. Pleasant. Friendly and conversant. Not anxious or depressed appearing. Normal, full affect.  Foot: R Echymosis: no  Edema: no ROM: full LE B Gait: heel toe, non-antalgic MT pain: no Callus pattern: none Lateral Mall: NT Medial Mall: NT Talus: NT Navicular: NT Cuboid: NT Calcaneous: NT Metatarsals: NT 5th MT: NT Phalanges: NT Achilles:  Large nodule - not tender Plantar Fascia: NT Fat Pad: NT Peroneals: NT Post Tib: NT Great Toe: Nml motion Ant Drawer: neg ATFL: NT CFL:  NT Deltoid: NT Sensation: intact  Radiology: No results found.  Assessment and Plan:   Achilles tendinitis of right lower extremity  She has done really well Cont eccentrics and NTG as long as she needs  Follow-up: prn  Signed,  [facility] T. Cloa Bushong, MD   Patient's Medications  New Prescriptions   No medications on file  Previous Medications   ALPRAZOLAM (XANAX) 0.25 MG TABLET    Take 1 tablet (0.25 mg total) by mouth daily as needed for anxiety.   BIOTIN 10 MG TABS    Take 1 tablet by mouth daily.   CALCIUM CARBONATE-VITAMIN D (CALCIUM 600+D) 600-400 MG-UNIT PER TABLET    Take 1 tablet by mouth daily.   CETIRIZINE (ZYRTEC) 10 MG TABLET    Take 10 mg by mouth as needed.     CRANBERRY PO    Take 1 capsule by mouth daily.   FAMCICLOVIR (FAMVIR) 250 MG TABLET    TAKE 1 TABLET DAILY   LISINOPRIL-HYDROCHLOROTHIAZIDE (PRINZIDE,ZESTORETIC) 10-12.5 MG TABLET    TAKE 1 TABLET DAILY   NAPROXEN SODIUM (ANAPROX) 220 MG TABLET    Take 220 mg by mouth as needed.    NITROGLYCERIN (NITRODUR - DOSED IN MG/24 HR) 0.2 MG/HR PATCH    Apply 1/2 of a patch to the affected area and change every 24 hours (re: tendinopathy)  Modified Medications   No medications on file  Discontinued Medications   No medications on file

## 2016-02-29 NOTE — Progress Notes (Signed)
Pre visit review using our clinic review tool, if applicable. No additional management support is needed unless otherwise documented below in the visit note. 

## 2016-03-01 ENCOUNTER — Telehealth: Payer: Self-pay

## 2016-03-01 NOTE — Telephone Encounter (Signed)
Pt called to see if had already had pneumonia shot; advised pt she had prevnar 13 on 04/03/14 and pneumovax on 03/27/09. Pt voiced understanding and nothing further needed.

## 2016-03-16 DIAGNOSIS — L820 Inflamed seborrheic keratosis: Secondary | ICD-10-CM | POA: Diagnosis not present

## 2016-03-16 DIAGNOSIS — L689 Hypertrichosis, unspecified: Secondary | ICD-10-CM | POA: Diagnosis not present

## 2016-05-17 ENCOUNTER — Telehealth: Payer: Self-pay | Admitting: Family Medicine

## 2016-05-17 DIAGNOSIS — M8589 Other specified disorders of bone density and structure, multiple sites: Secondary | ICD-10-CM

## 2016-05-17 DIAGNOSIS — E7800 Pure hypercholesterolemia, unspecified: Secondary | ICD-10-CM

## 2016-05-17 NOTE — Telephone Encounter (Signed)
-----   Message from Eustace Pen, LPN sent at [phone]  2:13 PM EST ----- Regarding: Lab Orders 12/7 Please place lab orders, if any. Thank you.

## 2016-05-19 ENCOUNTER — Other Ambulatory Visit: Payer: Medicare Other

## 2016-05-19 ENCOUNTER — Ambulatory Visit (INDEPENDENT_AMBULATORY_CARE_PROVIDER_SITE_OTHER): Payer: Medicare Other

## 2016-05-19 ENCOUNTER — Other Ambulatory Visit: Payer: Self-pay

## 2016-05-19 VITALS — BP 124/70 | HR 65 | Temp 97.9°F | Ht 65.5 in | Wt 175.0 lb

## 2016-05-19 DIAGNOSIS — Z0000 Encounter for general adult medical examination without abnormal findings: Secondary | ICD-10-CM

## 2016-05-19 DIAGNOSIS — E7800 Pure hypercholesterolemia, unspecified: Secondary | ICD-10-CM

## 2016-05-19 LAB — LIPID PANEL
CHOLESTEROL: 251 mg/dL — AB (ref 0–200)
HDL: 46.9 mg/dL (ref >39.00)
LDL Cholesterol: 178 mg/dL — ABNORMAL HIGH (ref 0–99)
NONHDL: 203.8
Total CHOL/HDL Ratio: 5
Triglycerides: 127 mg/dL (ref 0.0–149.0)
VLDL: 25.4 mg/dL (ref 0.0–40.0)

## 2016-05-19 LAB — COMPREHENSIVE METABOLIC PANEL
ALK PHOS: 64 U/L (ref 39–117)
ALT: 18 U/L (ref 0–35)
AST: 20 U/L (ref 0–37)
Albumin: 4.3 g/dL (ref 3.5–5.2)
BUN: 23 mg/dL (ref 6–23)
CO2: 30 meq/L (ref 19–32)
Calcium: 9.5 mg/dL (ref 8.4–10.5)
Chloride: 102 mEq/L (ref 96–112)
Creatinine, Ser: 1.09 mg/dL (ref 0.40–1.20)
GFR: 51.87 mL/min — ABNORMAL LOW (ref >60.00)
GLUCOSE: 97 mg/dL (ref 70–99)
POTASSIUM: 4.1 meq/L (ref 3.5–5.1)
SODIUM: 139 meq/L (ref 135–145)
TOTAL PROTEIN: 7.2 g/dL (ref 6.0–8.3)
Total Bilirubin: 0.4 mg/dL (ref 0.2–1.2)

## 2016-05-19 NOTE — Patient Instructions (Signed)
Patty Shelton , Thank you for taking time to come for your Medicare Wellness Visit. I appreciate your ongoing commitment to your health goals. Please review the following plan we discussed and let me know if I can assist you in the future.   These are the goals we discussed: Goals    . Increase water intake          Starting 05/19/2016, I will attempt to drink at least 6-8 glasses of water daily.        This is a list of the screening recommended for you and due dates:  Health Maintenance  Topic Date Due  . Mammogram  05/19/2017*  . Colon Cancer Screening  01/09/2017  . Tetanus Vaccine  03/10/2021  . Flu Shot  Completed  . DEXA scan (bone density measurement)  Completed  . Shingles Vaccine  Completed  . Pneumonia vaccines  Completed  *Topic was postponed. The date shown is not the original due date.   Preventive Care for Adults  A healthy lifestyle and preventive care can promote health and wellness. Preventive health guidelines for adults include the following key practices.  . A routine yearly physical is a good way to check with your health care provider about your health and preventive screening. It is a chance to share any concerns and updates on your health and to receive a thorough exam.  . Visit your dentist for a routine exam and preventive care every 6 months. Brush your teeth twice a day and floss once a day. Good oral hygiene prevents tooth decay and gum disease.  . The frequency of eye exams is based on your age, health, family medical history, use  of contact lenses, and other factors. Follow your health care provider's ecommendations for frequency of eye exams.  . Eat a healthy diet. Foods like vegetables, fruits, whole grains, low-fat dairy products, and lean protein foods contain the nutrients you need without too many calories. Decrease your intake of foods high in solid fats, added sugars, and salt. Eat the right amount of calories for you. Get information about a  proper diet from your health care provider, if necessary.  . Regular physical exercise is one of the most important things you can do for your health. Most adults should get at least 150 minutes of moderate-intensity exercise (any activity that increases your heart rate and causes you to sweat) each week. In addition, most adults need muscle-strengthening exercises on 2 or more days a week.  Silver Sneakers may be a benefit available to you. To determine eligibility, you may visit the website: www.silversneakers.com or contact program at 207-068-4551 Mon-Fri between 8AM-8PM.   . Maintain a healthy weight. The body mass index (BMI) is a screening tool to identify possible weight problems. It provides an estimate of body fat based on height and weight. Your health care provider can find your BMI and can help you achieve or maintain a healthy weight.   For adults 20 years and older: ? A BMI below 18.5 is considered underweight. ? A BMI of 18.5 to 24.9 is normal. ? A BMI of 25 to 29.9 is considered overweight. ? A BMI of 30 and above is considered obese.   . Maintain normal blood lipids and cholesterol levels by exercising and minimizing your intake of saturated fat. Eat a balanced diet with plenty of fruit and vegetables. Blood tests for lipids and cholesterol should begin at age 63 and be repeated every 5 years. If your lipid or  cholesterol levels are high, you are over 50, or you are at high risk for heart disease, you may need your cholesterol levels checked more frequently. Ongoing high lipid and cholesterol levels should be treated with medicines if diet and exercise are not working.  . If you smoke, find out from your health care provider how to quit. If you do not use tobacco, please do not start.  . If you choose to drink alcohol, please do not consume more than 2 drinks per day. One drink is considered to be 12 ounces (355 mL) of beer, 5 ounces (148 mL) of wine, or 1.5 ounces (44 mL) of  liquor.  . If you are 21-40 years old, ask your health care provider if you should take aspirin to prevent strokes.  . Use sunscreen. Apply sunscreen liberally and repeatedly throughout the day. You should seek shade when your shadow is shorter than you. Protect yourself by wearing long sleeves, pants, a wide-brimmed hat, and sunglasses year round, whenever you are outdoors.  . Once a month, do a whole body skin exam, using a mirror to look at the skin on your back. Tell your health care provider of new moles, moles that have irregular borders, moles that are larger than a pencil eraser, or moles that have changed in shape or color.

## 2016-05-19 NOTE — Progress Notes (Signed)
Pre visit review using our clinic review tool, if applicable. No additional management support is needed unless otherwise documented below in the visit note. 

## 2016-05-19 NOTE — Progress Notes (Signed)
Subjective:   Patty Shelton is a 75 y.o. female who presents for Medicare Annual (Subsequent) preventive examination.  Review of Systems:  N/A Cardiac Risk Factors include: advanced age (>59men, >50 women);dyslipidemia;hypertension     Objective:     Vitals: BP 124/70 (BP Location: Left Arm, Patient Position: Sitting, Cuff Size: Normal)   Pulse 65   Temp 97.9 F (36.6 C) (Oral)   Ht 5' 5.5" (1.664 m) Comment: no shoes  Wt 175 lb (79.4 kg)   SpO2 95%   BMI 28.68 kg/m   Body mass index is 28.68 kg/m.   Tobacco History  Smoking Status  . Never Smoker  Smokeless Tobacco  . Never Used     Counseling given: No   History reviewed. No pertinent past medical history. History reviewed. No pertinent surgical history. Family History  Problem Relation Age of Onset  . Heart attack Mother   . Stroke Father   . Hyperlipidemia Father   . Dementia Father    History  Sexual Activity  . Sexual activity: Yes    Outpatient Encounter Prescriptions as of 05/19/2016  Medication Sig  . ALPRAZolam (XANAX) 0.25 MG tablet Take 1 tablet (0.25 mg total) by mouth daily as needed for anxiety.  . Biotin 10 MG TABS Take 1 tablet by mouth daily.  . Calcium Carbonate-Vitamin D (CALCIUM 600+D) 600-400 MG-UNIT per tablet Take 1 tablet by mouth daily.  . cetirizine (ZYRTEC) 10 MG tablet Take 10 mg by mouth as needed.    Marland Kitchen CRANBERRY PO Take 1 capsule by mouth daily.  . famciclovir (FAMVIR) 250 MG tablet TAKE 1 TABLET DAILY  . lisinopril-hydrochlorothiazide (PRINZIDE,ZESTORETIC) 10-12.5 MG tablet TAKE 1 TABLET DAILY  . naproxen sodium (ANAPROX) 220 MG tablet Take 220 mg by mouth as needed.   . nitroGLYCERIN (NITRODUR - DOSED IN MG/24 HR) 0.2 mg/hr patch Apply 1/2 of a patch to the affected area and change every 24 hours (re: tendinopathy)   No facility-administered encounter medications on file as of 05/19/2016.     Activities of Daily Living In your present state of health, do you have any  difficulty performing the following activities: 05/19/2016  Hearing? Y  Vision? N  Difficulty concentrating or making decisions? N  Walking or climbing stairs? N  Dressing or bathing? N  Doing errands, shopping? N  Preparing Food and eating ? N  Using the Toilet? N  In the past six months, have you accidently leaked urine? Y  Do you have problems with loss of bowel control? N  Managing your Medications? N  Managing your Finances? N  Housekeeping or managing your Housekeeping? N  Some recent data might be hidden    Patient Care Team: Jinny Sanders, MD as PCP - General Owens Loffler, MD as Consulting Physician (Sports Medicine) Ralene Bathe, MD as Consulting Physician (Dermatology) Calvert Cantor, MD as Consulting Physician (Ophthalmology) Judie Petit. Carman Ching, MD as Consulting Physician (Dentistry) Burnard Hawthorne, FNP as Nurse Practitioner (Family Medicine)    Assessment:     Hearing Screening   125Hz  250Hz  500Hz  1000Hz  2000Hz  3000Hz  4000Hz  6000Hz  8000Hz   Right ear:   0 0 0  40    Left ear:   40 0 40  40    Vision Screening Comments: Last vision exam in April 2017   Exercise Activities and Dietary recommendations Current Exercise Habits: The patient does not participate in regular exercise at present, Exercise limited by: None identified  Goals    . Increase  water intake          Starting 05/19/2016, I will attempt to drink at least 6-8 glasses of water daily.       Fall Risk Fall Risk  05/19/2016 05/22/2015 04/03/2014 03/01/2013  Falls in the past year? No No No No   Depression Screen PHQ 2/9 Scores 05/19/2016 05/22/2015 04/03/2014 03/01/2013  PHQ - 2 Score 0 0 0 0     Cognitive Function MMSE - Mini Mental State Exam 05/19/2016  Orientation to time 5  Orientation to Place 5  Registration 3  Attention/ Calculation 0  Recall 3  Language- name 2 objects 0  Language- repeat 1  Language- follow 3 step command 3  Language- read & follow direction 0  Write a  sentence 0  Copy design 0  Total score 20     PLEASE NOTE: A Mini-Cog screen was completed. Maximum score is 20. A value of 0 denotes this part of Folstein MMSE was not completed or the patient failed this part of the Mini-Cog screening.   Mini-Cog Screening Orientation to Time - Max 5 pts Orientation to Place - Max 5 pts Registration - Max 3 pts Recall - Max 3 pts Language Repeat - Max 1 pts Language Follow 3 Step Command - Max 3 pts     Immunization History  Administered Date(s) Administered  . Influenza Split 05/24/2011, 03/01/2013, 04/24/2015  . Influenza Whole 03/27/2009  . Influenza, High Dose Seasonal PF 02/29/2016  . Influenza,inj,Quad PF,36+ Mos 04/03/2014  . Pneumococcal Conjugate-13 04/03/2014  . Pneumococcal Polysaccharide-23 03/27/2009  . Tdap 03/11/2011  . Zoster 06/02/2014   Screening Tests Health Maintenance  Topic Date Due  . MAMMOGRAM  05/19/2017 (Originally 03/21/2014)  . COLONOSCOPY  01/09/2017  . TETANUS/TDAP  03/10/2021  . INFLUENZA VACCINE  Completed  . DEXA SCAN  Completed  . ZOSTAVAX  Completed  . PNA vac Low Risk Adult  Completed      Plan:     I have personally reviewed and addressed the Medicare Annual Wellness questionnaire and have noted the following in the patient's chart:  A. Medical and social history B. Use of alcohol, tobacco or illicit drugs  C. Current medications and supplements D. Functional ability and status E.  Nutritional status F.  Physical activity G. Advance directives H. List of other physicians I.  Hospitalizations, surgeries, and ER visits in previous 12 months J.  [place] to include hearing, vision, cognitive, depression L. Referrals and appointments - none  In addition, I have reviewed and discussed with patient certain preventive protocols, quality metrics, and best practice recommendations. A written personalized care plan for preventive services as well as general preventive health  recommendations were provided to patient.  See attached scanned questionnaire for additional information.   Signed,   Lindell Noe, MHA, BS, LPN Health Coach

## 2016-05-19 NOTE — Progress Notes (Signed)
PCP notes:   Health maintenance:  Mammogram - pt will discuss with PCP at next appt  Abnormal screenings:   Hearing - failed  Patient concerns:   None  Nurse concerns:  None  Next PCP appt:   05/26/16 @ 1115

## 2016-05-20 ENCOUNTER — Other Ambulatory Visit: Payer: Self-pay | Admitting: Family Medicine

## 2016-05-20 NOTE — Progress Notes (Signed)
I reviewed health advisor's note, was available for consultation, and agree with documentation and plan.  Amy Bedsole, MD [facility]  

## 2016-05-26 ENCOUNTER — Encounter: Payer: Self-pay | Admitting: Family Medicine

## 2016-05-26 ENCOUNTER — Ambulatory Visit (INDEPENDENT_AMBULATORY_CARE_PROVIDER_SITE_OTHER): Payer: Medicare Other | Admitting: Family Medicine

## 2016-05-26 VITALS — Ht 65.5 in

## 2016-05-26 DIAGNOSIS — E7800 Pure hypercholesterolemia, unspecified: Secondary | ICD-10-CM

## 2016-05-26 NOTE — Progress Notes (Signed)
Pre visit review using our clinic review tool, if applicable. No additional management support is needed unless otherwise documented below in the visit note. 

## 2016-06-20 DIAGNOSIS — L812 Freckles: Secondary | ICD-10-CM | POA: Diagnosis not present

## 2016-06-20 DIAGNOSIS — L578 Other skin changes due to chronic exposure to nonionizing radiation: Secondary | ICD-10-CM | POA: Diagnosis not present

## 2016-06-20 DIAGNOSIS — L820 Inflamed seborrheic keratosis: Secondary | ICD-10-CM | POA: Diagnosis not present

## 2016-06-20 DIAGNOSIS — L821 Other seborrheic keratosis: Secondary | ICD-10-CM | POA: Diagnosis not present

## 2016-06-24 ENCOUNTER — Encounter: Payer: Self-pay | Admitting: Family Medicine

## 2016-06-24 ENCOUNTER — Ambulatory Visit (INDEPENDENT_AMBULATORY_CARE_PROVIDER_SITE_OTHER): Payer: Medicare Other | Admitting: Family Medicine

## 2016-06-24 VITALS — BP 104/68 | HR 80 | Temp 98.5°F | Ht 65.5 in | Wt 177.5 lb

## 2016-06-24 DIAGNOSIS — N3946 Mixed incontinence: Secondary | ICD-10-CM | POA: Diagnosis not present

## 2016-06-24 DIAGNOSIS — N183 Chronic kidney disease, stage 3 unspecified: Secondary | ICD-10-CM

## 2016-06-24 DIAGNOSIS — I10 Essential (primary) hypertension: Secondary | ICD-10-CM

## 2016-06-24 DIAGNOSIS — E7800 Pure hypercholesterolemia, unspecified: Secondary | ICD-10-CM | POA: Diagnosis not present

## 2016-06-24 DIAGNOSIS — M8589 Other specified disorders of bone density and structure, multiple sites: Secondary | ICD-10-CM | POA: Diagnosis not present

## 2016-06-24 LAB — POC URINALSYSI DIPSTICK (AUTOMATED)
Bilirubin, UA: NEGATIVE
Blood, UA: NEGATIVE
Glucose, UA: NEGATIVE
Ketones, UA: NEGATIVE
LEUKOCYTES UA: NEGATIVE
NITRITE UA: NEGATIVE
PH UA: 6
PROTEIN UA: NEGATIVE
Spec Grav, UA: 1.015
Urobilinogen, UA: 0.2

## 2016-06-24 MED ORDER — FAMCICLOVIR 250 MG PO TABS: 250.0000 mg | ORAL_TABLET | Freq: Every day | ORAL | 3 refills | Status: DC

## 2016-06-24 MED ORDER — MIRABEGRON ER 25 MG PO TB24: 25.0000 mg | ORAL_TABLET | Freq: Every day | ORAL | 11 refills | Status: DC

## 2016-06-24 MED ORDER — LISINOPRIL-HYDROCHLOROTHIAZIDE 10-12.5 MG PO TABS: 1.0000 | ORAL_TABLET | Freq: Every day | ORAL | 3 refills | Status: DC

## 2016-06-24 MED ORDER — PRAVASTATIN SODIUM 40 MG PO TABS: 40.0000 mg | ORAL_TABLET | Freq: Every day | ORAL | 11 refills | Status: DC

## 2016-06-24 NOTE — Progress Notes (Signed)
Pre visit review using our clinic review tool, if applicable. No additional management support is needed unless otherwise documented below in the visit note. 

## 2016-06-24 NOTE — Assessment & Plan Note (Signed)
High risk for CVD per risk calculator AHA..  SE to high intenstity statin.. Trail of moderate dose pravastatin 40 mg daily. Re-eval in 3 months.

## 2016-06-24 NOTE — Patient Instructions (Addendum)
Start trial of myrbetriq 25 mg at bedtime.  Kegel exercises... If stress component is not improving.. Call for referral to GYN.   Work on [employer] and start regular exercise 3-5 times a day.  Start pravastatin 40 mg daily  Call to schedule mammogram.

## 2016-06-24 NOTE — Assessment & Plan Note (Signed)
Well controlled. Continue current medication.  

## 2016-06-24 NOTE — Assessment & Plan Note (Signed)
Encouraged pt to repeat.. Refused.  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

## 2016-06-24 NOTE — Addendum Note (Signed)
Addended by: Carter Kitten on: 06/24/2016 04:11 PM   Modules accepted: Orders

## 2016-06-24 NOTE — Assessment & Plan Note (Signed)
Likely due to chronic HTN.Stable on ACEI.

## 2016-06-24 NOTE — Assessment & Plan Note (Signed)
Trial of myrbetriq 25 mg daily.  Kegel exercises... If stress component is not improving.. Call for referral to GYN.

## 2016-06-24 NOTE — Progress Notes (Signed)
Subjective:    Patient ID: Patty Shelton, female    DOB: 1940/08/28, 76 y.o.   MRN: [id number]  HPI   The patient saw Candis Musa, LPN for medicare wellness. Note reviewed in detail and important notes copied below. 05/19/2016 Health maintenance: Mammogram - pt will discuss with PCP at next appt  Abnormal screenings:  Hearing - failed  Patient concerns:  None  Nurse concerns: None   TODAY: She has noted years of worsening urge incontinence.. Has to go 2 times every night. Also loss of urine with cough and sneeze.  Not as frequent during thew day.. Does have urge has to rush to bathroom.. Wears pads. No dysuria, no hematuria.   Hypertension:  Well controlled on lisinopril HCTZ. BP Readings from Last 3 Encounters:  06/24/16 104/68  05/19/16 124/70  02/29/16 138/66  Using medication without problems or lightheadedness: None Chest pain with exertion:None Edema:None Short of breath: None Average home BPs: good. Other issues: Body mass index is 29.09 kg/m. Wt Readings from Last 3 Encounters:  06/24/16 177 lb 8 oz (80.5 kg)  05/19/16 175 lb (79.4 kg)  02/29/16 171 lb 12 oz (77.9 kg)   Elevated Cholesterol:   AHA risk cal state 23% risk of CV event in next 10 years..high dose statin recommended. Leg cramps on statin (atorvastatin 40 mg) in past.. Still having occ leg cramps at times, upper leg and inner thigh.. improves with stretching. Lab Results  Component Value Date   CHOL 251 (H) 05/19/2016   HDL 46.90 05/19/2016   LDLCALC 178 (H) 05/19/2016   LDLDIRECT 180.6 04/04/2011   TRIG 127.0 05/19/2016   CHOLHDL 5 05/19/2016  Diet compliance: moderate Exercise:None Other complaints:  CKD stable stage 3  Social History /Family History/Past Medical History reviewed and updated if needed.  Review of Systems She has noted a few weeks of tailbone pain in bed.. No fall.    Objective:   Physical Exam  Constitutional: Vital signs are normal. She appears  well-developed and well-nourished. She is cooperative.  Non-toxic appearance. She does not appear ill. No distress.  HENT:  Head: Normocephalic.  Right Ear: Hearing, tympanic membrane, external ear and ear canal normal.  Left Ear: Hearing, tympanic membrane, external ear and ear canal normal.  Nose: Nose normal.  Eyes: Conjunctivae, EOM and lids are normal. Pupils are equal, round, and reactive to light. Lids are everted and swept, no foreign bodies found.  Neck: Trachea normal and normal range of motion. Neck supple. Carotid bruit is not present. No thyroid mass and no thyromegaly present.  Cardiovascular: Normal rate, regular rhythm, S1 normal, S2 normal, normal heart sounds and intact distal pulses.  Exam reveals no gallop.   No murmur heard. Pulmonary/Chest: Effort normal and breath sounds normal. No respiratory distress. She has no wheezes. She has no rhonchi. She has no rales.  Abdominal: Soft. Normal appearance and bowel sounds are normal. She exhibits no distension, no fluid wave, no abdominal bruit and no mass. There is no hepatosplenomegaly. There is no tenderness. There is no rebound, no guarding and no CVA tenderness. No hernia.  Musculoskeletal:  ttp over coccyx  Lymphadenopathy:    She has no cervical adenopathy.    She has no axillary adenopathy.  Neurological: She is alert. She has normal strength. No cranial nerve deficit or sensory deficit.  Skin: Skin is warm, dry and intact. No rash noted.  Psychiatric: Her speech is normal and behavior is normal. Judgment normal. Her mood appears not anxious.  Cognition and memory are normal. She does not exhibit a depressed mood.          Assessment & Plan:  The patient's preventative maintenance and recommended screening tests for an annual wellness exam were reviewed in full today. Brought up to date unless services declined.  Counselled on the importance of diet, exercise, and its role in overall health and mortality. The  patient's FH and SH was reviewed, including their home life, tobacco status, and drug and alcohol status.   Vaccines: Uptodate with flu, zoster, PCV 23, 13, Tdap No pap indicated/DVE 2014. No cancer in family. No vaginal bleeding.  STOP DVE. Mammogram: 2014 ,  Continue q 2 years. DXA: osteopenia 2012, nml vit D, plan repeat in 5 years... Due.. Not interested at this time. ( despite coccyx  Soreness/fracture may be due to new osetoporosis Colon cancer screen: 12/2006, hem. only, repeat in 10 year... Due.. Plan cologuard instead.

## 2016-06-27 ENCOUNTER — Telehealth: Payer: Self-pay | Admitting: *Deleted

## 2016-06-27 NOTE — Telephone Encounter (Signed)
Received fax from [facility] requesting PA for Myrbetriq 25 mg.  Per CoverMyMeds the following medications may not require PAs  Oxybutynin or Oxybutynin ER Tolterodine or Tolterodine ER Trospium or Trospium ER  Okay to change medication or proceed with PA?

## 2016-06-28 NOTE — Telephone Encounter (Signed)
Patty Shelton notified as instructed by telephone.  She will hold off on medication for incontinence at this time.

## 2016-06-28 NOTE — Telephone Encounter (Signed)
Ms. Drill notified as instructed.  She is willing to try one of the of the other medications but wants one prescribed that does not have dry mouth as a side effect.

## 2016-06-28 NOTE — Telephone Encounter (Signed)
Let pt know of this.. Discussed other meds and SE of other meds at [facility] [place]. Is she willing to try one of those first?  If does not tolerate we can then do PA for myrbetriq.  Otherwise PA will not be given likely.

## 2016-06-28 NOTE — Telephone Encounter (Signed)
All of the other ones d have dry mouth as they are all anticolinergic, sorry.

## 2016-07-11 DIAGNOSIS — M545 Low back pain: Secondary | ICD-10-CM | POA: Diagnosis not present

## 2016-07-11 DIAGNOSIS — M25551 Pain in right hip: Secondary | ICD-10-CM | POA: Diagnosis not present

## 2016-07-25 ENCOUNTER — Encounter: Payer: Medicare Other | Admitting: Family Medicine

## 2016-07-25 DIAGNOSIS — M533 Sacrococcygeal disorders, not elsewhere classified: Secondary | ICD-10-CM | POA: Diagnosis not present

## 2016-08-01 NOTE — Progress Notes (Signed)
Cancelled.  

## 2016-08-03 DIAGNOSIS — Z1212 Encounter for screening for malignant neoplasm of rectum: Secondary | ICD-10-CM | POA: Diagnosis not present

## 2016-08-03 DIAGNOSIS — Z1211 Encounter for screening for malignant neoplasm of colon: Secondary | ICD-10-CM | POA: Diagnosis not present

## 2016-08-03 LAB — FECAL OCCULT BLOOD, GUAIAC: Fecal Occult Blood: NEGATIVE

## 2016-08-03 LAB — COLOGUARD: Cologuard: NEGATIVE

## 2016-08-04 ENCOUNTER — Encounter: Payer: Self-pay | Admitting: Family Medicine

## 2016-08-04 ENCOUNTER — Ambulatory Visit (INDEPENDENT_AMBULATORY_CARE_PROVIDER_SITE_OTHER): Payer: Medicare Other | Admitting: Family Medicine

## 2016-08-04 DIAGNOSIS — H6981 Other specified disorders of Eustachian tube, right ear: Secondary | ICD-10-CM | POA: Diagnosis not present

## 2016-08-04 MED ORDER — PRAVASTATIN SODIUM 40 MG PO TABS: 40.0000 mg | ORAL_TABLET | Freq: Every day | ORAL | 2 refills | Status: DC

## 2016-08-04 MED ORDER — PRAVASTATIN SODIUM 40 MG PO TABS: 40.0000 mg | ORAL_TABLET | Freq: Every day | ORAL | 0 refills | Status: DC

## 2016-08-04 NOTE — Patient Instructions (Signed)
Start flonase 2 sprays per nostril daily x 1-2 weeks.  Continue zyrtec. Can add nasal saline spray.  Call if not improving in 3-4 week.. Can consider course of oral prednisone.

## 2016-08-04 NOTE — Assessment & Plan Note (Signed)
No infection or wax. Treat with nasal steroid spray.

## 2016-08-04 NOTE — Progress Notes (Signed)
Pre visit review using our clinic review tool, if applicable. No additional management support is needed unless otherwise documented below in the visit note. 

## 2016-08-04 NOTE — Progress Notes (Signed)
   Subjective:    Patient ID: Patty Shelton, female    DOB: [birth year], 76 y.o.   MRN: [id number]  HPI   76 year old female presents with feeling of fullness in right ear. No pain.  Occurring in last 2-3 weeks. No congestion, no sinus pressure.. Has constant post nasal drip   Hx of hearing loss at [facility] at last check. Worse right then left. No fever.  She is currently on zyrtec for allergies.  Has not tried any thing else except occ sweet oil.  Review of Systems  Constitutional: Negative for fatigue.  HENT: Negative for mouth sores.   Eyes: Negative for pain.  Respiratory: Negative for cough and shortness of breath.        Objective:   Physical Exam  Constitutional: She appears well-developed.  HENT:  Head: Normocephalic.  Left Ear: External ear normal.  Mouth/Throat: Oropharynx is clear and moist.  Right ear with clear fluid behind tympanic membrane  Eyes: Conjunctivae are normal. Pupils are equal, round, and reactive to light. Right eye exhibits no discharge. Left eye exhibits no discharge.  Neck: Normal range of motion. No thyromegaly present.  Lymphadenopathy:    She has no cervical adenopathy.          Assessment & Plan:

## 2016-08-12 ENCOUNTER — Encounter: Payer: Self-pay | Admitting: Family Medicine

## 2016-08-12 NOTE — Progress Notes (Signed)
Patty Shelton notified by telephone that her Cologuard test was negative.

## 2016-08-18 ENCOUNTER — Ambulatory Visit (INDEPENDENT_AMBULATORY_CARE_PROVIDER_SITE_OTHER): Payer: Medicare Other | Admitting: Primary Care

## 2016-08-18 ENCOUNTER — Encounter: Payer: Self-pay | Admitting: Primary Care

## 2016-08-18 VITALS — BP 136/84 | HR 81 | Temp 98.3°F | Ht 65.5 in | Wt 178.1 lb

## 2016-08-18 DIAGNOSIS — J029 Acute pharyngitis, unspecified: Secondary | ICD-10-CM

## 2016-08-18 LAB — POCT RAPID STREP A (OFFICE): Rapid Strep A Screen: NEGATIVE

## 2016-08-18 NOTE — Patient Instructions (Signed)
Your strep test is negative. Your symptoms are likely related to allergies or a virus.   Continue warm salt gargles, chloraseptic spray.  Try Ibuprofen 600 mg three times daily as needed for pain and inflammation.  Continue use of Flonase for fluid in the ears.  It was a pleasure meeting you!

## 2016-08-18 NOTE — Addendum Note (Signed)
Addended by: Jacqualin Combes on: 08/18/2016 10:49 AM   Modules accepted: Orders

## 2016-08-18 NOTE — Progress Notes (Signed)
Pre visit review using our clinic review tool, if applicable. No additional management support is needed unless otherwise documented below in the visit note. 

## 2016-08-18 NOTE — Progress Notes (Signed)
Subjective:    Patient ID: Patty Shelton, female    DOB: 11/25/1940, 76 y.o.   MRN: [id number]  HPI  Ms. Ethridge is 76 year old female with a history of allergic rhinitis and eustachian tube dysfunction who presents today with a chief complaint of sore throat. She also reports voice hoarse, cough. Her symptoms began 2 nights ago. She denies fevers, productive cough. She's taken Zyrtec daily, gargled with warm salt water, and is using chloraseptic spray with temporary improvement. She's also been using Flonase for a mid ear effusion. She denies sick contacts.   Review of Systems  Constitutional: Negative for chills, fatigue and fever.  HENT: Positive for postnasal drip, sinus pressure and sore throat. Negative for congestion and ear pain.   Respiratory: Positive for cough.        No past medical history on file.   Social History   Social History  . Marital status: Married    Spouse name: N/A  . Number of children: N/A  . Years of education: N/A   Occupational History  . Not on file.   Social History Main Topics  . Smoking status: Never Smoker  . Smokeless tobacco: Never Used  . Alcohol use No  . Drug use: No  . Sexual activity: Yes   Other Topics Concern  . Not on file   Social History Narrative   No living will , no HCPOA. Full code ( reviewed 2015, given packet)    No past surgical history on file.  Family History  Problem Relation Age of Onset  . Heart attack Mother   . Stroke Father   . Hyperlipidemia Father   . Dementia Father     Allergies  Allergen Reactions  . Lipitor [Atorvastatin] Other (See Comments)    Aches at 40mg  dose    Current Outpatient Prescriptions on File Prior to Visit  Medication Sig Dispense Refill  . ALPRAZolam (XANAX) 0.25 MG tablet Take 1 tablet (0.25 mg total) by mouth daily as needed for anxiety. 20 tablet 0  . Biotin 10 MG TABS Take 1 tablet by mouth daily.    . Calcium Carbonate-Vitamin D (CALCIUM 600+D) 600-400 MG-UNIT  per tablet Take 1 tablet by mouth daily.    . cetirizine (ZYRTEC) 10 MG tablet Take 10 mg by mouth as needed.      Marland Kitchen CRANBERRY PO Take 1 capsule by mouth daily.    . famciclovir (FAMVIR) 250 MG tablet Take 1 tablet (250 mg total) by mouth daily. 90 tablet 3  . lisinopril-hydrochlorothiazide (PRINZIDE,ZESTORETIC) 10-12.5 MG tablet Take 1 tablet by mouth daily. 90 tablet 3  . naproxen sodium (ANAPROX) 220 MG tablet Take 220 mg by mouth as needed.     . nitroGLYCERIN (NITRODUR - DOSED IN MG/24 HR) 0.2 mg/hr patch Apply 1/2 of a patch to the affected area and change every 24 hours (re: tendinopathy) 30 patch 5  . pravastatin (PRAVACHOL) 40 MG tablet Take 1 tablet (40 mg total) by mouth daily. 90 tablet 2   No current facility-administered medications on file prior to visit.     BP 136/84   Pulse 81   Temp 98.3 F (36.8 C) (Oral)   Ht 5' 5.5" (1.664 m)   Wt 178 lb 1.9 oz (80.8 kg)   SpO2 96%   BMI 29.19 kg/m    Objective:   Physical Exam  Constitutional: She appears well-nourished. She does not appear ill.  HENT:  Right Ear: Tympanic membrane is bulging. Tympanic  membrane is not erythematous.  Left Ear: Tympanic membrane is bulging. Tympanic membrane is not erythematous. A middle ear effusion is present.  Neck: Neck supple.  Cardiovascular: Normal rate and regular rhythm.   Pulmonary/Chest: Effort normal and breath sounds normal.  Skin: Skin is warm and dry.  Psychiatric: She has a normal mood and affect.          Assessment & Plan:  Sore Throat:  Also with mild cough, and hoarse voice x 2 days. Overall feeling better today with OTC and home remedies. Exam today without evidence for strep, will test to provide reassurance. She does not appear acutely ill. Vitals normal. Rapid Strep: Negative Suspect allergy/viral related.  Continue Zyrtec, warm salt gargles, chloraseptic spray. Fluids, rest, follow up PRN.  Sheral Flow, NP

## 2016-08-26 DIAGNOSIS — J029 Acute pharyngitis, unspecified: Secondary | ICD-10-CM | POA: Diagnosis not present

## 2016-09-06 DIAGNOSIS — M461 Sacroiliitis, not elsewhere classified: Secondary | ICD-10-CM | POA: Diagnosis not present

## 2016-09-13 DIAGNOSIS — M25551 Pain in right hip: Secondary | ICD-10-CM | POA: Diagnosis not present

## 2016-09-16 DIAGNOSIS — M25551 Pain in right hip: Secondary | ICD-10-CM | POA: Diagnosis not present

## 2016-09-19 DIAGNOSIS — M25551 Pain in right hip: Secondary | ICD-10-CM | POA: Diagnosis not present

## 2016-09-26 DIAGNOSIS — M25551 Pain in right hip: Secondary | ICD-10-CM | POA: Diagnosis not present

## 2016-09-29 DIAGNOSIS — H5212 Myopia, left eye: Secondary | ICD-10-CM | POA: Diagnosis not present

## 2016-09-29 DIAGNOSIS — H04123 Dry eye syndrome of bilateral lacrimal glands: Secondary | ICD-10-CM | POA: Diagnosis not present

## 2016-09-29 DIAGNOSIS — H524 Presbyopia: Secondary | ICD-10-CM | POA: Diagnosis not present

## 2016-09-29 DIAGNOSIS — H5201 Hypermetropia, right eye: Secondary | ICD-10-CM | POA: Diagnosis not present

## 2016-09-29 DIAGNOSIS — H43393 Other vitreous opacities, bilateral: Secondary | ICD-10-CM | POA: Diagnosis not present

## 2016-09-29 DIAGNOSIS — H52221 Regular astigmatism, right eye: Secondary | ICD-10-CM | POA: Diagnosis not present

## 2016-09-30 DIAGNOSIS — M25551 Pain in right hip: Secondary | ICD-10-CM | POA: Diagnosis not present

## 2016-10-03 DIAGNOSIS — M25551 Pain in right hip: Secondary | ICD-10-CM | POA: Diagnosis not present

## 2016-10-06 DIAGNOSIS — M5416 Radiculopathy, lumbar region: Secondary | ICD-10-CM | POA: Diagnosis not present

## 2016-10-10 ENCOUNTER — Other Ambulatory Visit: Payer: Self-pay | Admitting: Physician Assistant

## 2016-10-10 DIAGNOSIS — M5416 Radiculopathy, lumbar region: Secondary | ICD-10-CM

## 2016-10-21 ENCOUNTER — Ambulatory Visit
Admission: RE | Admit: 2016-10-21 | Discharge: 2016-10-21 | Disposition: A | Discharge status: Home / Self Care | Payer: Medicare Other | Source: Ambulatory Visit | Attending: Physician Assistant | Admitting: Physician Assistant

## 2016-10-21 DIAGNOSIS — M4316 Spondylolisthesis, lumbar region: Secondary | ICD-10-CM | POA: Insufficient documentation

## 2016-10-21 DIAGNOSIS — M48061 Spinal stenosis, lumbar region without neurogenic claudication: Secondary | ICD-10-CM | POA: Insufficient documentation

## 2016-10-21 DIAGNOSIS — M5116 Intervertebral disc disorders with radiculopathy, lumbar region: Secondary | ICD-10-CM | POA: Insufficient documentation

## 2016-10-21 DIAGNOSIS — M5416 Radiculopathy, lumbar region: Secondary | ICD-10-CM

## 2016-11-10 ENCOUNTER — Ambulatory Visit (INDEPENDENT_AMBULATORY_CARE_PROVIDER_SITE_OTHER): Payer: Medicare Other | Admitting: Family Medicine

## 2016-11-10 DIAGNOSIS — J209 Acute bronchitis, unspecified: Secondary | ICD-10-CM

## 2016-11-10 MED ORDER — AZITHROMYCIN 250 MG PO TABS: ORAL_TABLET | ORAL | 0 refills | Status: AC

## 2016-11-10 NOTE — Progress Notes (Signed)
SUBJECTIVE:  Patty Shelton is a 76 y.o. female who complains of coryza, congestion, productive cough and wheezing 10 days. She denies a history of anorexia, chills and fevers and denies a history of asthma. Patient denies smoke cigarettes.   Does have h/o allergic rhinitis- taking zyrtec, flonase and robitussin DM, Current Outpatient Prescriptions on File Prior to Visit  Medication Sig Dispense Refill  . ALPRAZolam (XANAX) 0.25 MG tablet Take 1 tablet (0.25 mg total) by mouth daily as needed for anxiety. 20 tablet 0  . Biotin 10 MG TABS Take 1 tablet by mouth daily.    . Calcium Carbonate-Vitamin D (CALCIUM 600+D) 600-400 MG-UNIT per tablet Take 1 tablet by mouth daily.    . cetirizine (ZYRTEC) 10 MG tablet Take 10 mg by mouth as needed.      Marland Kitchen CRANBERRY PO Take 1 capsule by mouth daily.    . famciclovir (FAMVIR) 250 MG tablet Take 1 tablet (250 mg total) by mouth daily. 90 tablet 3  . lisinopril-hydrochlorothiazide (PRINZIDE,ZESTORETIC) 10-12.5 MG tablet Take 1 tablet by mouth daily. 90 tablet 3  . naproxen sodium (ANAPROX) 220 MG tablet Take 220 mg by mouth as needed.     . nitroGLYCERIN (NITRODUR - DOSED IN MG/24 HR) 0.2 mg/hr patch Apply 1/2 of a patch to the affected area and change every 24 hours (re: tendinopathy) 30 patch 5  . pravastatin (PRAVACHOL) 40 MG tablet Take 1 tablet (40 mg total) by mouth daily. 90 tablet 2   No current facility-administered medications on file prior to visit.     Allergies  Allergen Reactions  . Lipitor [Atorvastatin] Other (See Comments)    Aches at 40mg  dose    No past medical history on file.  No past surgical history on file.  Family History  Problem Relation Age of Onset  . Heart attack Mother   . Stroke Father   . Hyperlipidemia Father   . Dementia Father     Social History   Social History  . Marital status: Married    Spouse name: N/A  . Number of children: N/A  . Years of education: N/A   Occupational History  . Not on  file.   Social History Main Topics  . Smoking status: Never Smoker  . Smokeless tobacco: Never Used  . Alcohol use No  . Drug use: No  . Sexual activity: Yes   Other Topics Concern  . Not on file   Social History Narrative   No living will , no HCPOA. Full code ( reviewed 2015, given packet)   The PMH, PSH, Social History, Family History, Medications, and allergies have been reviewed in [facility], and have been updated if relevant.  OBJECTIVE: BP 128/60   Pulse 81   Temp 98.3 F (36.8 C)   Wt 178 lb 12.8 oz (81.1 kg)   SpO2 98%   BMI 29.30 kg/m   She appears well, vital signs are as noted. Ears normal.  Throat and pharynx normal.  Neck supple. No adenopathy in the neck. Nose is congested. Sinuses non tender. The chest is clear, without wheezes or rales.  ASSESSMENT:  allergic rhinitis with ? Bronchitis given duration of symptoms.  PLAN: Given duration and progression of symptoms, will treat for bacterial process with zpack.  Symptomatic therapy suggested: push fluids, rest and return office visit prn if symptoms persist or worsen.  Call or return to clinic prn if these symptoms worsen or fail to improve as anticipated.

## 2016-11-10 NOTE — Progress Notes (Signed)
Pre visit review using our clinic review tool, if applicable. No additional management support is needed unless otherwise documented below in the visit note. 

## 2016-11-27 ENCOUNTER — Emergency Department
Admission: EM | Admit: 2016-11-27 | Discharge: 2016-11-27 | Disposition: A | Discharge status: Home / Self Care | Payer: Medicare Other | Attending: Emergency Medicine | Admitting: Emergency Medicine

## 2016-11-27 ENCOUNTER — Emergency Department: Payer: Medicare Other

## 2016-11-27 ENCOUNTER — Encounter: Payer: Self-pay | Admitting: Emergency Medicine

## 2016-11-27 DIAGNOSIS — R0602 Shortness of breath: Secondary | ICD-10-CM | POA: Diagnosis not present

## 2016-11-27 DIAGNOSIS — J9801 Acute bronchospasm: Secondary | ICD-10-CM | POA: Diagnosis not present

## 2016-11-27 DIAGNOSIS — R05 Cough: Secondary | ICD-10-CM | POA: Diagnosis not present

## 2016-11-27 DIAGNOSIS — R0600 Dyspnea, unspecified: Secondary | ICD-10-CM

## 2016-11-27 DIAGNOSIS — J849 Interstitial pulmonary disease, unspecified: Secondary | ICD-10-CM | POA: Diagnosis not present

## 2016-11-27 DIAGNOSIS — J984 Other disorders of lung: Secondary | ICD-10-CM | POA: Diagnosis not present

## 2016-11-27 HISTORY — DX: Essential (primary) hypertension: I10

## 2016-11-27 HISTORY — DX: Hyperlipidemia, unspecified: E78.5

## 2016-11-27 LAB — CBC
HEMATOCRIT: 39.7 % (ref 35.0–47.0)
HEMOGLOBIN: 13.6 g/dL (ref 12.0–16.0)
MCH: 30.3 pg (ref 26.0–34.0)
MCHC: 34.2 g/dL (ref 32.0–36.0)
MCV: 88.7 fL (ref 80.0–100.0)
Platelets: 300 10*3/uL (ref 150–440)
RBC: 4.48 MIL/uL (ref 3.80–5.20)
RDW: 12.9 % (ref 11.5–14.5)
WBC: 17.8 10*3/uL — AB (ref 3.6–11.0)

## 2016-11-27 LAB — BASIC METABOLIC PANEL
ANION GAP: 7 (ref 5–15)
BUN: 20 mg/dL (ref 6–20)
CHLORIDE: 105 mmol/L (ref 101–111)
CO2: 26 mmol/L (ref 22–32)
Calcium: 9.3 mg/dL (ref 8.9–10.3)
Creatinine, Ser: 0.99 mg/dL (ref 0.44–1.00)
GFR calc non Af Amer: 54 mL/min — ABNORMAL LOW (ref >60)
Glucose, Bld: 114 mg/dL — ABNORMAL HIGH (ref 65–99)
Potassium: 4.1 mmol/L (ref 3.5–5.1)
Sodium: 138 mmol/L (ref 135–145)

## 2016-11-27 LAB — FIBRIN DERIVATIVES D-DIMER (ARMC ONLY): FIBRIN DERIVATIVES D-DIMER (ARMC): 845.85 — AB (ref 0.00–499.00)

## 2016-11-27 LAB — TROPONIN I: Troponin I: <0.03 ng/mL (ref <0.03)

## 2016-11-27 MED ORDER — DOXYCYCLINE HYCLATE 100 MG PO CAPS
100.0000 mg | ORAL_CAPSULE | Freq: Two times a day (BID) | ORAL | 0 refills | Status: DC
Start: 2016-11-27 — End: 2016-12-13

## 2016-11-27 MED ORDER — PREDNISONE 20 MG PO TABS
40.0000 mg | ORAL_TABLET | Freq: Once | ORAL | Status: AC
  Administered 2016-11-27: 40 mg via ORAL
  Filled 2016-11-27: qty 2

## 2016-11-27 MED ORDER — PREDNISONE 20 MG PO TABS: 40.0000 mg | ORAL_TABLET | Freq: Every day | ORAL | 0 refills | Status: DC

## 2016-11-27 MED ORDER — BENZONATATE 100 MG PO CAPS: 100.0000 mg | ORAL_CAPSULE | Freq: Four times a day (QID) | ORAL | 0 refills | Status: DC | PRN

## 2016-11-27 MED ORDER — DOXYCYCLINE HYCLATE 100 MG PO TABS
100.0000 mg | ORAL_TABLET | Freq: Once | ORAL | Status: AC
  Administered 2016-11-27: 100 mg via ORAL
  Filled 2016-11-27: qty 1

## 2016-11-27 MED ORDER — BENZONATATE 100 MG PO CAPS
100.0000 mg | ORAL_CAPSULE | Freq: Once | ORAL | Status: AC
  Administered 2016-11-27: 100 mg via ORAL
  Filled 2016-11-27: qty 1

## 2016-11-27 MED ORDER — ALBUTEROL SULFATE HFA 108 (90 BASE) MCG/ACT IN AERS: 2.0000 | INHALATION_SPRAY | Freq: Four times a day (QID) | RESPIRATORY_TRACT | 2 refills | Status: DC | PRN

## 2016-11-27 MED ORDER — ALBUTEROL SULFATE (2.5 MG/3ML) 0.083% IN NEBU
5.0000 mg | INHALATION_SOLUTION | Freq: Once | RESPIRATORY_TRACT | Status: AC
  Administered 2016-11-27: 5 mg via RESPIRATORY_TRACT
  Filled 2016-11-27: qty 6

## 2016-11-27 MED ORDER — IOPAMIDOL (ISOVUE-370) INJECTION 76%
75.0000 mL | Freq: Once | INTRAVENOUS | Status: AC | PRN
  Administered 2016-11-27: 75 mL via INTRAVENOUS

## 2016-11-27 NOTE — ED Provider Notes (Signed)
[facility] Emergency Department Provider Note   ____________________________________________   First MD Initiated Contact with Patient 11/27/16 [id number]     (approximate)  I have reviewed the triage vital signs and the nursing notes.   HISTORY  Chief Complaint Shortness of Breath  HPI Patty Shelton is a 76 y.o. female previous medical history of high blood pressure and hyperlipidemia.  About one month ago patient reports she began havingnasal drainage, dry cough, congestion and frequent coughing episodes. She saw her doctor about 2 weeks ago and was diagnosed with possible bronchitis as she describes it, treated with azithromycin but reports she has not seen any improvement.  Last night she began noticing she seemed more short of breath, she first nasal drainage and frequent coughing kept her up.  His night chest pain. Her husband and the patient report that she was wheezing fairly prominently in about 4 5 in the morning, but this seems to gotten better now. She feels quite a bit better right now, and right now does not feel short of breath. Denies any chest pain now or with walking. She reports last night about 4 5 in the morning the symptoms were at their maximum.   Past Medical History:  Diagnosis Date  . Hyperlipemia   . Hypertension     Patient Active Problem List   Diagnosis Date Noted  . Eustachian tube dysfunction, right 08/04/2016  . Mixed incontinence 06/24/2016  . Bilateral thigh pain 09/29/2015  . CKD (chronic kidney disease) stage 3, GFR 30-59 ml/min 05/22/2015  . Advance directive discussed with patient 05/22/2015  . Hearing loss 03/01/2013  . Herpes genitalia 04/28/2011  . Osteopenia 03/22/2011  . Hypertension 02/11/2011  . HYPERCHOLESTEROLEMIA 03/27/2009  . ALLERGIC RHINITIS CAUSE UNSPECIFIED 03/27/2009    Past Surgical History:  Procedure Laterality Date  . CATARACT EXTRACTION    . RETINAL DETACHMENT SURGERY      Prior to  Admission medications   Medication Sig Start Date End Date Taking? Authorizing Provider  acetaminophen (TYLENOL) 500 MG tablet Take 500 mg by mouth every 6 (six) hours as needed.   Yes [provider]  ALPRAZolam (XANAX) 0.25 MG tablet Take 1 tablet (0.25 mg total) by mouth daily as needed for anxiety. 09/29/15  Yes Bedsole, Amy E, MD  Biotin 10 MG TABS Take 1 tablet by mouth daily.   Yes [provider]  Calcium Carbonate-Vitamin D (CALCIUM 600+D) 600-400 MG-UNIT per tablet Take 1 tablet by mouth daily.   Yes [provider]  cetirizine (ZYRTEC) 10 MG tablet Take 10 mg by mouth daily.    Yes [provider]  CRANBERRY PO Take 1 capsule by mouth daily.   Yes [provider]  ibuprofen (ADVIL,MOTRIN) 200 MG tablet Take 200 mg by mouth every 6 (six) hours as needed.   Yes [provider]  lisinopril-hydrochlorothiazide (PRINZIDE,ZESTORETIC) 10-12.5 MG tablet Take 1 tablet by mouth daily. 06/24/16  Yes Bedsole, Amy E, MD  naproxen sodium (ANAPROX) 220 MG tablet Take 220 mg by mouth as needed.    Yes [provider]  pravastatin (PRAVACHOL) 40 MG tablet Take 1 tablet (40 mg total) by mouth daily. 08/04/16  Yes Bedsole, Amy E, MD  albuterol (PROVENTIL HFA;VENTOLIN HFA) 108 (90 Base) MCG/ACT inhaler Inhale 2 puffs into the lungs every 6 (six) hours as needed for wheezing or shortness of breath. 11/27/16   Delman Kitten, MD  doxycycline (VIBRAMYCIN) 100 MG capsule Take 1 capsule (100 mg total) by mouth  2 (two) times daily. 11/27/16   Delman Kitten, MD  famciclovir (FAMVIR) 250 MG tablet Take 1 tablet (250 mg total) by mouth daily. Patient not taking: Reported on 11/27/2016 06/24/16   Jinny Sanders, MD  nitroGLYCERIN (NITRODUR - DOSED IN MG/24 HR) 0.2 mg/hr patch Apply 1/2 of a patch to the affected area and change every 24 hours (re: tendinopathy) Patient not taking: Reported on 11/27/2016 12/30/15   Copland, Frederico Hamman, MD  predniSONE (DELTASONE) 20 MG  tablet Take 2 tablets (40 mg total) by mouth daily with breakfast. 11/27/16   Delman Kitten, MD    Allergies Lipitor [atorvastatin]  Family History  Problem Relation Age of Onset  . Heart attack Mother   . Stroke Father   . Hyperlipidemia Father   . Dementia Father     Social History Social History  Substance Use Topics  . Smoking status: Never Smoker  . Smokeless tobacco: Never Used  . Alcohol use No    Review of Systems Constitutional: No fever/chills Eyes: No visual changes. ENT: No sore throat.runny nose and nasal congestion for about a months. Cardiovascular: Denies chest pain. Respiratory: see history of present illness Gastrointestinal: No abdominal pain.  No nausea, no vomiting.  No diarrhea.  No constipation. Genitourinary: Negative for dysuria. Musculoskeletal: Negative for back pain. Skin: Negative for rash. Neurological: Negative for headaches, focal weakness or numbness.  No leg swelling. No recent surgeries or trauma. Does not smoke. No history of blood clots. No blood in her sputum. No leg swelling.   ____________________________________________   PHYSICAL EXAM:  VITAL SIGNS: ED Triage Vitals  Enc Vitals Group     BP 11/27/16 0755 (!) 176/76     Pulse Rate 11/27/16 0755 92     Resp 11/27/16 0755 (!) 24     Temp 11/27/16 0755 98.2 F (36.8 C)     Temp Source 11/27/16 0755 Oral     SpO2 11/27/16 0754 91 %     Weight 11/27/16 0755 175 lb (79.4 kg)     Height 11/27/16 0755 5\' 6"  (1.676 m)     Head Circumference --      Peak Flow --      Pain Score --      Pain Loc --      Pain Edu? --      Excl. in [place]? --     Constitutional: Alert and oriented. Well appearing and in no acute distress. Eyes: Conjunctivae are normal. Head: Atraumatic. Nose: No congestion/rhinnorhea. Mouth/Throat: Mucous membranes are moist. Neck: No stridor.   Cardiovascular: Normal rate, regular rhythm. Grossly normal heart sounds.  Good peripheral circulation. Respiratory:  Normal respiratory effort.  No retractions. Lungs CTAB.Speaks in full and clear sentences. Oxygen saturation noted to be about 91-94% on room air throughout my exam. Gastrointestinal: Soft and nontender. No distention. Musculoskeletal: No lower extremity tenderness nor edema. Neurologic:  Normal speech and language. No gross focal neurologic deficits are appreciated.  Skin:  Skin is warm, dry and intact. No rash noted. Psychiatric: Mood and affect are normal. Speech and behavior are normal.  ____________________________________________   LABS (all labs ordered are listed, but only abnormal results are displayed)  Labs Reviewed  CBC - Abnormal; Notable for the following:       Result Value   WBC 17.8 (*)    All other components within normal limits  BASIC METABOLIC PANEL - Abnormal; Notable for the following:    Glucose, Bld 114 (*)    GFR calc non  Af Amer 54 (*)    All other components within normal limits  FIBRIN DERIVATIVES D-DIMER ([facility] ONLY) - Abnormal; Notable for the following:    Fibrin derivatives D-dimer ([facility]) 845.85 (*)    All other components within normal limits  CULTURE, BLOOD (ROUTINE X 2)  CULTURE, BLOOD (ROUTINE X 2)  TROPONIN I   ____________________________________________  EKG  Reviewed and interpreted by me at 7:55 AM Ventricular rate 75 QRS 80 QTc 440 Normal sinus rhythm, no evidence of ischemia or ectopy ____________________________________________  RADIOLOGY  Dg Chest 2 View  Result Date: 11/27/2016 CLINICAL DATA:  76 year old female with a history of productive cough, shortness of breath EXAM: CHEST  2 VIEW COMPARISON:  None. FINDINGS: Cardiomediastinal silhouette within normal limits. Calcifications of the aortic arch. No pneumothorax.  No large pleural effusion. Early changes of x-ray findings associated with emphysema, with increasing retrosternal airspace, flattening of the hemidiaphragms, increased AP diameter, and mild hyperinflation on the AP  view. Reticular pattern opacity throughout the bilateral lungs. No confluent airspace disease. No interlobular septal thickening. IMPRESSION: No evidence of lobar pneumonia. Reticular pattern opacity throughout the lungs with no comparison available. Given the overall appearance, these changes are favored to reflect developing emphysema/ scarring, however, atypical infection or pneumonitis difficult to exclude and correlation with lab values may be useful. Electronically Signed   By: Corrie Mckusick D.O.   On: 11/27/2016 08:39   Ct Angio Chest Pe W Or Wo Contrast  Result Date: 11/27/2016 CLINICAL DATA:  Productive cough for 1 month with sudden onset of shortness of breath this morning. EXAM: CT ANGIOGRAPHY CHEST WITH CONTRAST TECHNIQUE: Multidetector CT imaging of the chest was performed using the standard protocol during bolus administration of intravenous contrast. Multiplanar CT image reconstructions and MIPs were obtained to evaluate the vascular anatomy. CONTRAST:  75 mL of Isovue 370 COMPARISON:  Chest x-ray from this morning FINDINGS: Cardiovascular: The heart size is normal. No coronary artery calcifications are seen. The top of the aortic arch was not well assessed as the apices had to be scanned separately due to a scanner malfunction. The second set of images which imaged the top of the aortic arch demonstrated poor opacification of the vessels. Within this limitation, the thoracic aorta is non aneurysmal with no dissection. Mild atherosclerotic changes are seen. No pulmonary emboli. Mediastinum/Nodes: The esophagus and thyroid are normal. No adenopathy. No effusions. Lungs/Pleura: Central airways are normal. No pneumothorax. Pleuroparenchymal scarring at the apices is mild. No suspicious infiltrate identified. No evidence of pneumonia. Mild peripheral reticular changes are seen in nondependent portions of the lungs. No overt edema. Mild atelectasis in the bases. No suspicious nodules or masses. Upper  Abdomen: A low-attenuation lesion in the left hepatic lobe on series 4, image 60 is too small to characterize but probably a cyst. Evaluation of the upper abdomen is otherwise unremarkable. Musculoskeletal: No chest wall abnormality. No acute or significant osseous findings. Review of the MIP images confirms the above findings. IMPRESSION: 1. No pulmonary emboli. 2. Mild peripheral reticular changes in the lungs consistent with mild interstitial changes. 3. Atherosclerosis in the non aneurysmal thoracic aorta. Aortic Atherosclerosis (ICD10-I70.0). Electronically Signed   By: Dorise Bullion III M.D   On: 11/27/2016 10:45    ____________________________________________   PROCEDURES  Procedure(s) performed: None  Procedures  Critical Care performed: No  ____________________________________________   INITIAL IMPRESSION / ASSESSMENT AND PLAN / ED COURSE  Pertinent labs & imaging results that were available during my care of the patient  were reviewed by me and considered in my medical decision making (see chart for details).  One month of cough. Fairly nonproductive, no change in treatment after Z-Pak. At the time of ER evaluation stable hemodynamics and shortness of breath seems to is resolved. She does have an occasional dry cough, and her oxygen saturation is noted to be at the borderline low for normal range.  I suspect likely an element of bronchospasm likely brought about by some type of bronchitis or viral URI, however given the patient's age and prolonged presentation of symptoms I do believe it would be worth trialing treatment with steroids, nebulizer for bronchospasm, as well as obtaining labs and chest x-ray to further evaluate. I doubt acute cardiac etiology. No evidence of CHF by exam or previous history.    Clinical Course as of Nov 27 1129  Sun Nov 27, 2016  2751 Patient reports improvement in her symptoms. Her white count is notably quite elevated, and given her chest x-ray  findings this raises my suspicion for a potentially ongoing infectious etiology. 2 obtain further imaging, and better define the cause for her hypoxia I have ordered a CT angiography of the chest  Patient reports improvement. Currently saturating 95% on room air with no evidence of increased work of breathing  [MQ]    Clinical Course User Index [MQ] Delman Kitten, MD   ----------------------------------------- 11:29 AM on 11/27/2016 -----------------------------------------  Case and clinical history discussed with Dr. Loreen Freud of the pulmonary service. He recommends outpatient treatment with 10 days of doxycycline, plus traditional therapy for reactive airway disease and follow-up with pulmonary in the clinic. I think this is reasonable, the patient is ambulated without desaturation is now feeling much better with normal respirations and no ongoing shortness of breath.  Return precautions and treatment recommendations and follow-up discussed with the patient who is agreeable with the plan.   ____________________________________________   FINAL CLINICAL IMPRESSION(S) / ED DIAGNOSES  Final diagnoses:  Dyspnea  Interstitial lung disease (HCC)  Bronchial spasm      NEW MEDICATIONS STARTED DURING THIS VISIT:  New Prescriptions   ALBUTEROL (PROVENTIL HFA;VENTOLIN HFA) 108 (90 BASE) MCG/ACT INHALER    Inhale 2 puffs into the lungs every 6 (six) hours as needed for wheezing or shortness of breath.   DOXYCYCLINE (VIBRAMYCIN) 100 MG CAPSULE    Take 1 capsule (100 mg total) by mouth 2 (two) times daily.   PREDNISONE (DELTASONE) 20 MG TABLET    Take 2 tablets (40 mg total) by mouth daily with breakfast.     Note:  This document was prepared using Dragon voice recognition software and may include unintentional dictation errors.     Delman Kitten, MD 11/27/16 1131

## 2016-11-27 NOTE — ED Notes (Signed)
Pt denies fever at home.  

## 2016-11-27 NOTE — ED Triage Notes (Signed)
Pt reports been having productive cough for a month taking OTC meds woke up this morning short of breath. Denies any CP talks in complete sentences.

## 2016-11-27 NOTE — Discharge Instructions (Signed)
We believe that your symptoms are caused today by bronchitis and possibly an infection within the lung similar to pneumonia.  Please take the prescribed medications and any medications that you have at home for your COPD.  Follow up with your doctor as recommended.  If you develop any new or worsening symptoms, including but not limited to fever, persistent vomiting, worsening shortness of breath, or other symptoms that concern you, please return to the Emergency Department immediately.

## 2016-11-27 NOTE — ED Notes (Signed)
PT ambulating without difficulty. Pts oxygen saturation did not drop below 98% while ambulating and pt denies having SOB while ambulating.

## 2016-11-29 ENCOUNTER — Encounter: Payer: Self-pay | Admitting: Family Medicine

## 2016-11-29 ENCOUNTER — Ambulatory Visit (INDEPENDENT_AMBULATORY_CARE_PROVIDER_SITE_OTHER): Payer: Medicare Other | Admitting: Family Medicine

## 2016-11-29 VITALS — BP 118/70 | HR 83 | Temp 98.6°F | Resp 12 | Ht 65.5 in | Wt 175.0 lb

## 2016-11-29 DIAGNOSIS — J208 Acute bronchitis due to other specified organisms: Secondary | ICD-10-CM | POA: Diagnosis not present

## 2016-11-29 DIAGNOSIS — J849 Interstitial pulmonary disease, unspecified: Secondary | ICD-10-CM

## 2016-11-29 NOTE — Progress Notes (Signed)
Dr. Frederico Hamman T. Jerlean Peralta, MD, [facility] [place], [address] Phone: [phone] Fax: [phone]  11/29/2016  Patient: Patty Shelton, MRN: [id number], DOB: April 26, 1941, 76 y.o.  Primary Physician:  Jinny Sanders, MD   Chief Complaint  Patient presents with  . ER Follow-up for Interstitial Lung Disease    Went to ER 11-27-16. Saw Dr Deborra Medina 11-10-16 for same issues.   Subjective:   Patty Shelton is a 76 y.o. very pleasant female patient who presents with the following:  Was at the ER and got short of breath.   Had a CXR, CT angiogram in the ER.  D-dimer very high WBC 17.8 with no differential.  ? CT angio notes possible interstitial process   She is doing well on Doxy and prednisone with prn albuterol.   Past Medical History, Surgical History, Social History, Family History, Problem List, Medications, and Allergies have been reviewed and updated if relevant.  Patient Active Problem List   Diagnosis Date Noted  . Eustachian tube dysfunction, right 08/04/2016  . Mixed incontinence 06/24/2016  . Bilateral thigh pain 09/29/2015  . CKD (chronic kidney disease) stage 3, GFR 30-59 ml/min 05/22/2015  . Advance directive discussed with patient 05/22/2015  . Hearing loss 03/01/2013  . Herpes genitalia 04/28/2011  . Osteopenia 03/22/2011  . Hypertension 02/11/2011  . HYPERCHOLESTEROLEMIA 03/27/2009  . ALLERGIC RHINITIS CAUSE UNSPECIFIED 03/27/2009    Past Medical History:  Diagnosis Date  . Hyperlipemia   . Hypertension     Past Surgical History:  Procedure Laterality Date  . CATARACT EXTRACTION    . RETINAL DETACHMENT SURGERY      Social History   Social History  . Marital status: Married    Spouse name: N/A  . Number of children: N/A  . Years of education: N/A   Occupational History  . Not on file.   Social History Main Topics  . Smoking status: Never Smoker  . Smokeless tobacco: Never Used  .  Alcohol use No  . Drug use: No  . Sexual activity: Yes   Other Topics Concern  . Not on file   Social History Narrative   No living will , no HCPOA. Full code ( reviewed 2015, given packet)    Family History  Problem Relation Age of Onset  . Heart attack Mother   . Stroke Father   . Hyperlipidemia Father   . Dementia Father     Allergies  Allergen Reactions  . Lipitor [Atorvastatin] Other (See Comments)    Aches at 40mg  dose Leg cramps     Medication list reviewed and updated in full in [facility].   GEN: No acute illnesses, no fevers, chills. GI: No n/v/d, eating normally Pulm: No SOB Interactive and getting along well at home.  Otherwise, ROS is as per the HPI.  Objective:   BP 118/70 (BP Location: Left Arm, Patient Position: Sitting, Cuff Size: Large)   Pulse 83   Temp 98.6 F (37 C) (Oral)   Resp 12   Ht 5' 5.5" (1.664 m)   Wt 175 lb (79.4 kg)   SpO2 93%   BMI 28.68 kg/m   GEN: WDWN, NAD, Non-toxic, A & O x 3 HEENT: Atraumatic, Normocephalic. Neck supple. No masses, No LAD. Ears and Nose: No external deformity. CV: RRR, No M/G/R. No JVD. No thrill. No extra heart sounds. PULM: CTA B, no wheezes, crackles, rhonchi. No retractions. No resp. distress.  No accessory muscle use. EXTR: No c/c/e NEURO Normal gait.  PSYCH: Normally interactive. Conversant. Not depressed or anxious appearing.  Calm demeanor.   Laboratory and Imaging Data: Results for orders placed or performed during the hospital encounter of 11/27/16  Culture, blood (Routine X 2) w Reflex to ID Panel  Result Value Ref Range   Specimen Description BLOOD LEFT ARM    Special Requests      BOTTLES DRAWN AEROBIC AND ANAEROBIC Blood Culture adequate volume   Culture NO GROWTH 2 DAYS    Report Status PENDING   Culture, blood (Routine X 2) w Reflex to ID Panel  Result Value Ref Range   Specimen Description BLOOD RIGHT ARM    Special Requests      BOTTLES DRAWN AEROBIC AND ANAEROBIC  Blood Culture adequate volume   Culture NO GROWTH 2 DAYS    Report Status PENDING   CBC  Result Value Ref Range   WBC 17.8 (H) 3.6 - 11.0 K/uL   RBC 4.48 3.80 - 5.20 MIL/uL   Hemoglobin 13.6 12.0 - 16.0 g/dL   HCT 39.7 35.0 - 47.0 %   MCV 88.7 80.0 - 100.0 fL   MCH 30.3 26.0 - 34.0 pg   MCHC 34.2 32.0 - 36.0 g/dL   RDW 12.9 11.5 - 14.5 %   Platelets 300 150 - 440 K/uL  Basic metabolic panel  Result Value Ref Range   Sodium 138 135 - 145 mmol/L   Potassium 4.1 3.5 - 5.1 mmol/L   Chloride 105 101 - 111 mmol/L   CO2 26 22 - 32 mmol/L   Glucose, Bld 114 (H) 65 - 99 mg/dL   BUN 20 6 - 20 mg/dL   Creatinine, Ser 0.99 0.44 - 1.00 mg/dL   Calcium 9.3 8.9 - 10.3 mg/dL   GFR calc non Af Amer 54 (L) >60 mL/min   GFR calc Af Amer >60 >60 mL/min   Anion gap 7 5 - 15  Troponin I  Result Value Ref Range   Troponin I <0.03 <0.03 ng/mL  Fibrin derivatives D-Dimer ([facility] only)  Result Value Ref Range   Fibrin derivatives D-dimer ([facility]) 845.85 (H) 0.00 - 499.00    Dg Chest 2 View  Result Date: 11/27/2016 CLINICAL DATA:  76 year old female with a history of productive cough, shortness of breath EXAM: CHEST  2 VIEW COMPARISON:  None. FINDINGS: Cardiomediastinal silhouette within normal limits. Calcifications of the aortic arch. No pneumothorax.  No large pleural effusion. Early changes of x-ray findings associated with emphysema, with increasing retrosternal airspace, flattening of the hemidiaphragms, increased AP diameter, and mild hyperinflation on the AP view. Reticular pattern opacity throughout the bilateral lungs. No confluent airspace disease. No interlobular septal thickening. IMPRESSION: No evidence of lobar pneumonia. Reticular pattern opacity throughout the lungs with no comparison available. Given the overall appearance, these changes are favored to reflect developing emphysema/ scarring, however, atypical infection or pneumonitis difficult to exclude and correlation with lab values may be  useful. Electronically Signed   By: Corrie Mckusick D.O.   On: 11/27/2016 08:39   Ct Angio Chest Pe W Or Wo Contrast  Result Date: 11/27/2016 CLINICAL DATA:  Productive cough for 1 month with sudden onset of shortness of breath this morning. EXAM: CT ANGIOGRAPHY CHEST WITH CONTRAST TECHNIQUE: Multidetector CT imaging of the chest was performed using the standard protocol during bolus administration of intravenous contrast. Multiplanar CT image reconstructions and MIPs were obtained to evaluate the vascular anatomy. CONTRAST:  75 mL  of Isovue 370 COMPARISON:  Chest x-ray from this morning FINDINGS: Cardiovascular: The heart size is normal. No coronary artery calcifications are seen. The top of the aortic arch was not well assessed as the apices had to be scanned separately due to a scanner malfunction. The second set of images which imaged the top of the aortic arch demonstrated poor opacification of the vessels. Within this limitation, the thoracic aorta is non aneurysmal with no dissection. Mild atherosclerotic changes are seen. No pulmonary emboli. Mediastinum/Nodes: The esophagus and thyroid are normal. No adenopathy. No effusions. Lungs/Pleura: Central airways are normal. No pneumothorax. Pleuroparenchymal scarring at the apices is mild. No suspicious infiltrate identified. No evidence of pneumonia. Mild peripheral reticular changes are seen in nondependent portions of the lungs. No overt edema. Mild atelectasis in the bases. No suspicious nodules or masses. Upper Abdomen: A low-attenuation lesion in the left hepatic lobe on series 4, image 60 is too small to characterize but probably a cyst. Evaluation of the upper abdomen is otherwise unremarkable. Musculoskeletal: No chest wall abnormality. No acute or significant osseous findings. Review of the MIP images confirms the above findings. IMPRESSION: 1. No pulmonary emboli. 2. Mild peripheral reticular changes in the lungs consistent with mild interstitial  changes. 3. Atherosclerosis in the non aneurysmal thoracic aorta. Aortic Atherosclerosis (ICD10-I70.0). Electronically Signed   By: Dorise Bullion III M.D   On: 11/27/2016 10:45     Assessment and Plan:   ILD (interstitial lung disease) ([facility])  Acute bronchitis due to other specified organisms  Primary question is does the patient have underlying ILD or primary lung disease. No infiltrate on CTA.   Doing much better on steroids now. We appreciate Pulmonary's input.   WBC 17.8 without diff.   Follow-up: with Pulm  Future Appointments Date Time Provider [facility]  12/13/2016 3:15 PM Laverle Hobby, MD [facility] None   Medications Discontinued During This Encounter  Medication Reason  . nitroGLYCERIN (NITRODUR - DOSED IN MG/24 HR) 0.2 mg/hr patch Patient Discharge    Signed,  Frederico Hamman T. Fiza Nation, MD   Allergies as of 11/29/2016      Reactions   Lipitor [atorvastatin] Other (See Comments)   Aches at 40mg  dose Leg cramps       Medication List       Accurate as of 11/29/16 10:28 AM. Always use your most recent med list.          acetaminophen 500 MG tablet Commonly known as:  TYLENOL Take 500 mg by mouth every 6 (six) hours as needed.   albuterol 108 (90 Base) MCG/ACT inhaler Commonly known as:  PROVENTIL HFA;VENTOLIN HFA Inhale 2 puffs into the lungs every 6 (six) hours as needed for wheezing or shortness of breath.   ALPRAZolam 0.25 MG tablet Commonly known as:  XANAX Take 1 tablet (0.25 mg total) by mouth daily as needed for anxiety.   benzonatate 100 MG capsule Commonly known as:  TESSALON PERLES Take 1 capsule (100 mg total) by mouth every 6 (six) hours as needed for cough.   Biotin 10 MG Tabs Take 1 tablet by mouth daily.   CALCIUM 600+D 600-400 MG-UNIT tablet Generic drug:  Calcium Carbonate-Vitamin D Take 1 tablet by mouth daily.   cetirizine 10 MG tablet Commonly known as:  ZYRTEC Take 10 mg by mouth daily.   CRANBERRY PO Take 1  capsule by mouth daily.   doxycycline 100 MG capsule Commonly known as:  VIBRAMYCIN Take 1 capsule (100 mg total) by mouth 2 (  two) times daily.   famciclovir 250 MG tablet Commonly known as:  FAMVIR Take 1 tablet (250 mg total) by mouth daily.   ibuprofen 200 MG tablet Commonly known as:  ADVIL,MOTRIN Take 200 mg by mouth every 6 (six) hours as needed.   lisinopril-hydrochlorothiazide 10-12.5 MG tablet Commonly known as:  PRINZIDE,ZESTORETIC Take 1 tablet by mouth daily.   naproxen sodium 220 MG tablet Commonly known as:  ANAPROX Take 220 mg by mouth as needed.   pravastatin 40 MG tablet Commonly known as:  PRAVACHOL Take 1 tablet (40 mg total) by mouth daily.   predniSONE 20 MG tablet Commonly known as:  DELTASONE Take 2 tablets (40 mg total) by mouth daily with breakfast.

## 2016-12-02 LAB — CULTURE, BLOOD (ROUTINE X 2)
CULTURE: NO GROWTH
Culture: NO GROWTH
Special Requests: ADEQUATE
Special Requests: ADEQUATE

## 2016-12-12 NOTE — Progress Notes (Signed)
[facility] Consultation      Assessment and Plan:  Acute bronchitis with continued coughing.  -Patient's acute bronchitis, appears to be resolving, she continues to have persistent cough. -She is given reassurance that her cough is normal after an episode of bronchitis, this will resolve over the next few weeks. In the meantime, I have given a prescription for Tessalon to be used once 3 times daily as needed, this appeared to help previously with the cough.  Chronic rhinitis.  --Continue zyrtec, start nasacort.   Interstitial lung disease.  -Scattered interstitial changes which are very mild seen in the basilar dependent areas. I am doubtful that this represents true interstitial lung disease, the patient is asymptomatic at this time and has no other indicators of interstitial lung disease. -We will check a CT high resolution in 6 months time, if these changes persisted that time we will consider further workup.  Date: 12/12/2016  MRN# [id number] MARGURETE GUAMAN [birth year]  Referring Physician:   SIMYA TERCERO is a 76 y.o. old female seen in consultation for chief complaint of:    Chief Complaint  Patient presents with  . Hospitalization Follow-up    Pt seen in ED referred by Donella Stade eval ILD  . Cough    unresolved with some wheezing. Sl. chest pressure  . Shortness of Breath    with exhertion outside    HPI:  She went to the ED recently because of a chest cold which had been persistent for 2 months. She received a zpack end of May, not sure if it helped. Her cough and congestion progressed and had trouble breathing, then went to ED. She received breathing treatment and steroids.  She felt better in the ED, dc with scripts. She is now feeling better, but is not yet back to normal.   She never smoked, she has a lot of sinus drainage. She takes loratidine which helps. She has used nasal sprays.    Images personally reviewed, CT chest 11/27/16; very  mild bibasilar interstitial changes, minimal lymphadenopathy.  PMHX:   Past Medical History:  Diagnosis Date  . Hyperlipemia   . Hypertension    Surgical Hx:  Past Surgical History:  Procedure Laterality Date  . CATARACT EXTRACTION    . RETINAL DETACHMENT SURGERY     Family Hx:  Family History  Problem Relation Age of Onset  . Heart attack Mother   . Stroke Father   . Hyperlipidemia Father   . Dementia Father    Social Hx:   Social History  Substance Use Topics  . Smoking status: Never Smoker  . Smokeless tobacco: Never Used  . Alcohol use No   Medication:    Current Outpatient Prescriptions:  .  acetaminophen (TYLENOL) 500 MG tablet, Take 500 mg by mouth every 6 (six) hours as needed., Disp: , Rfl:  .  albuterol (PROVENTIL HFA;VENTOLIN HFA) 108 (90 Base) MCG/ACT inhaler, Inhale 2 puffs into the lungs every 6 (six) hours as needed for wheezing or shortness of breath., Disp: 1 Inhaler, Rfl: 2 .  ALPRAZolam (XANAX) 0.25 MG tablet, Take 1 tablet (0.25 mg total) by mouth daily as needed for anxiety., Disp: 20 tablet, Rfl: 0 .  benzonatate (TESSALON PERLES) 100 MG capsule, Take 1 capsule (100 mg total) by mouth every 6 (six) hours as needed for cough., Disp: 20 capsule, Rfl: 0 .  Biotin 10 MG TABS, Take 1 tablet by mouth daily., Disp: , Rfl:  .  Calcium Carbonate-Vitamin D (  CALCIUM 600+D) 600-400 MG-UNIT per tablet, Take 1 tablet by mouth daily., Disp: , Rfl:  .  cetirizine (ZYRTEC) 10 MG tablet, Take 10 mg by mouth daily. , Disp: , Rfl:  .  CRANBERRY PO, Take 1 capsule by mouth daily., Disp: , Rfl:  .  doxycycline (VIBRAMYCIN) 100 MG capsule, Take 1 capsule (100 mg total) by mouth 2 (two) times daily., Disp: 20 capsule, Rfl: 0 .  famciclovir (FAMVIR) 250 MG tablet, Take 1 tablet (250 mg total) by mouth daily., Disp: 90 tablet, Rfl: 3 .  ibuprofen (ADVIL,MOTRIN) 200 MG tablet, Take 200 mg by mouth every 6 (six) hours as needed., Disp: , Rfl:  .  lisinopril-hydrochlorothiazide  (PRINZIDE,ZESTORETIC) 10-12.5 MG tablet, Take 1 tablet by mouth daily., Disp: 90 tablet, Rfl: 3 .  naproxen sodium (ANAPROX) 220 MG tablet, Take 220 mg by mouth as needed. , Disp: , Rfl:  .  pravastatin (PRAVACHOL) 40 MG tablet, Take 1 tablet (40 mg total) by mouth daily., Disp: 90 tablet, Rfl: 2 .  predniSONE (DELTASONE) 20 MG tablet, Take 2 tablets (40 mg total) by mouth daily with breakfast., Disp: 10 tablet, Rfl: 0   Allergies:  Lipitor [atorvastatin]  Review of Systems: Gen:  Denies  fever, sweats, chills HEENT: Denies blurred vision, double vision. bleeds, sore throat Cvc:  No dizziness, chest pain. Resp:   Denies sputum production, shortness of breath Gi: Denies swallowing difficulty, stomach pain. Gu:  Denies bladder incontinence, burning urine Ext:   No Joint pain, stiffness. Skin: No skin rash,  hives  Endoc:  No polyuria, polydipsia. Psych: No depression, insomnia. Other:  All other systems were reviewed with the patient and were negative other that what is mentioned in the HPI.   Physical Examination:   VS: BP 132/84 (BP Location: Left Arm, Patient Position: Sitting, Cuff Size: Large)   Pulse 94   Resp 16   Ht 5' 5.5" (1.664 m)   Wt 178 lb 12.8 oz (81.1 kg)   SpO2 94%   BMI 29.30 kg/m   General Appearance: No distress  Neuro:without focal findings,  speech normal,  HEENT: PERRLA, EOM intact.   Pulmonary: normal breath sounds, No wheezing.  CardiovascularNormal S1,S2.  No m/r/g.   Abdomen: Benign, Soft, non-tender. Renal:  No costovertebral tenderness  GU:  No performed at this time. Endoc: No evident thyromegaly, no signs of acromegaly. Skin:   warm, no rashes, no ecchymosis  Extremities: normal, no cyanosis, clubbing.  Other findings:    LABORATORY PANEL:   CBC No results for input(s): WBC, HGB, HCT, PLT in the last 168  hours. ------------------------------------------------------------------------------------------------------------------  Chemistries  No results for input(s): NA, K, CL, CO2, GLUCOSE, BUN, CREATININE, CALCIUM, MG, AST, ALT, ALKPHOS, BILITOT in the last 168 hours.  Invalid input(s): GFRCGP ------------------------------------------------------------------------------------------------------------------  Cardiac Enzymes No results for input(s): TROPONINI in the last 168 hours. ------------------------------------------------------------  RADIOLOGY:  No results found.     Thank  you for the consultation and for allowing [facility] to assist in the care of your patient. Our recommendations are noted above.  Please contact us if we can be of further service.   Marda Stalker, MD.  Board Certified in Internal Medicine, Pulmonary Medicine, Hydro, and Sleep Medicine.  [facility] Office Number: [phone]  Patricia Pesa, M.D.  Merton Border, M.D  12/12/2016

## 2016-12-13 ENCOUNTER — Ambulatory Visit (INDEPENDENT_AMBULATORY_CARE_PROVIDER_SITE_OTHER): Payer: Medicare Other | Admitting: Internal Medicine

## 2016-12-13 ENCOUNTER — Encounter: Payer: Self-pay | Admitting: Internal Medicine

## 2016-12-13 VITALS — BP 132/84 | HR 94 | Resp 16 | Ht 65.5 in | Wt 178.8 lb

## 2016-12-13 DIAGNOSIS — J440 Chronic obstructive pulmonary disease with acute lower respiratory infection: Secondary | ICD-10-CM | POA: Diagnosis not present

## 2016-12-13 DIAGNOSIS — J849 Interstitial pulmonary disease, unspecified: Secondary | ICD-10-CM | POA: Diagnosis not present

## 2016-12-13 DIAGNOSIS — J209 Acute bronchitis, unspecified: Secondary | ICD-10-CM | POA: Diagnosis not present

## 2016-12-13 MED ORDER — BENZONATATE 100 MG PO CAPS: 100.0000 mg | ORAL_CAPSULE | Freq: Three times a day (TID) | ORAL | 1 refills | Status: DC

## 2016-12-13 NOTE — Patient Instructions (Addendum)
Start nasal spray, flonase or nasacort 2 sprays in each nostril once daily.   Continue zyrtec.   Repeat CT chest in 6 months.   Will restart tessalon.

## 2017-01-19 ENCOUNTER — Telehealth: Payer: Self-pay

## 2017-01-19 NOTE — Telephone Encounter (Signed)
Pt left v/m requesting cb for [facility]; pt did not leave name of med. Last annual on 06/24/16. Left v/m requesting pt to cb.

## 2017-01-27 ENCOUNTER — Other Ambulatory Visit: Payer: Self-pay | Admitting: Family Medicine

## 2017-01-27 DIAGNOSIS — Z1231 Encounter for screening mammogram for malignant neoplasm of breast: Secondary | ICD-10-CM

## 2017-02-01 NOTE — Telephone Encounter (Signed)
I spoke with pt and the issue is resolved and nothing further needed.

## 2017-02-07 ENCOUNTER — Encounter: Payer: Self-pay | Admitting: Internal Medicine

## 2017-02-23 ENCOUNTER — Ambulatory Visit
Admission: RE | Admit: 2017-02-23 | Discharge: 2017-02-23 | Disposition: A | Discharge status: Home / Self Care | Payer: Medicare Other | Source: Ambulatory Visit | Attending: Family Medicine | Admitting: Family Medicine

## 2017-02-23 DIAGNOSIS — Z1231 Encounter for screening mammogram for malignant neoplasm of breast: Secondary | ICD-10-CM | POA: Insufficient documentation

## 2017-03-17 ENCOUNTER — Ambulatory Visit (INDEPENDENT_AMBULATORY_CARE_PROVIDER_SITE_OTHER): Payer: Medicare Other | Admitting: Family Medicine

## 2017-03-17 ENCOUNTER — Encounter: Payer: Self-pay | Admitting: Family Medicine

## 2017-03-17 VITALS — BP 124/60 | HR 75 | Temp 98.5°F | Ht 65.5 in | Wt 179.5 lb

## 2017-03-17 DIAGNOSIS — J340 Abscess, furuncle and carbuncle of nose: Secondary | ICD-10-CM | POA: Insufficient documentation

## 2017-03-17 DIAGNOSIS — J069 Acute upper respiratory infection, unspecified: Secondary | ICD-10-CM | POA: Diagnosis not present

## 2017-03-17 MED ORDER — AMOXICILLIN-POT CLAVULANATE 875-125 MG PO TABS: 1.0000 | ORAL_TABLET | Freq: Two times a day (BID) | ORAL | 0 refills | Status: DC

## 2017-03-17 NOTE — Assessment & Plan Note (Signed)
Culture obtained.  treat with oral antibiotics given  Erythema and swelling midway up nares.

## 2017-03-17 NOTE — Patient Instructions (Signed)
Warm compresses on nose. Complete antibiotics x 7 days. Call if fever, redness spreading or unable to complete antibiotics.

## 2017-03-17 NOTE — Assessment & Plan Note (Signed)
Resolving.. Treat with symptomatic care.

## 2017-03-17 NOTE — Progress Notes (Signed)
   Subjective:    Patient ID: Patty Shelton, female    DOB: 1941-03-05, 76 y.o.   MRN: [id number]  Cough  This is a new problem. The current episode started in the past 7 days. The problem has been gradually improving. The cough is non-productive. Associated symptoms include nasal congestion and a sore throat. Pertinent negatives include no ear congestion, ear pain, fever, hemoptysis, rhinorrhea, shortness of breath or wheezing. Risk factors: nonsmoker. Treatments tried: mucine and dyqyil.    Last week she extracted mucus from her nose, had bloody nose... 1-2 days afterward she had nasal soreness, redness and spreading. On right nares.  no discharge for lesion.  Has treated with topical antibiotic ointment   no chronic lung disease, not immunocompromised.  Review of Systems  Constitutional: Negative for fever.  HENT: Positive for sore throat. Negative for ear pain and rhinorrhea.   Respiratory: Positive for cough. Negative for hemoptysis, shortness of breath and wheezing.    Blood pressure 124/60, pulse 75, temperature 98.5 F (36.9 C), temperature source Oral, height 5' 5.5" (1.664 m), weight 179 lb 8 oz (81.4 kg).      Objective:   Physical Exam  Constitutional: Vital signs are normal. She appears well-developed and well-nourished. She is cooperative.  Non-toxic appearance. She does not appear ill. No distress.  HENT:  Head: Normocephalic.  Right Ear: Hearing, tympanic membrane, external ear and ear canal normal. Tympanic membrane is not erythematous, not retracted and not bulging.  Left Ear: Hearing, tympanic membrane, external ear and ear canal normal. Tympanic membrane is not erythematous, not retracted and not bulging.  Nose: No mucosal edema or rhinorrhea. Right sinus exhibits no maxillary sinus tenderness and no frontal sinus tenderness. Left sinus exhibits no maxillary sinus tenderness and no frontal sinus tenderness.    Mouth/Throat: Uvula is midline, oropharynx is clear  and moist and mucous membranes are normal.  erythema in right side of nose with swelling  Up midway, lesion with central pustule in right nares  Eyes: Pupils are equal, round, and reactive to light. Conjunctivae, EOM and lids are normal. Lids are everted and swept, no foreign bodies found.  Neck: Trachea normal and normal range of motion. Neck supple. Carotid bruit is not present. No thyroid mass and no thyromegaly present.  Cardiovascular: Normal rate, regular rhythm, S1 normal, S2 normal, normal heart sounds, intact distal pulses and normal pulses.  Exam reveals no gallop and no friction rub.   No murmur heard. Pulmonary/Chest: Effort normal and breath sounds normal. No tachypnea. No respiratory distress. She has no decreased breath sounds. She has no wheezes. She has no rhonchi. She has no rales.  Abdominal: Soft. Normal appearance and bowel sounds are normal. There is no tenderness.  Neurological: She is alert.  Skin: Skin is warm, dry and intact. No rash noted.  Psychiatric: Her speech is normal and behavior is normal. Judgment and thought content normal. Her mood appears not anxious. Cognition and memory are normal. She does not exhibit a depressed mood.          Assessment & Plan:

## 2017-03-21 ENCOUNTER — Telehealth: Payer: Self-pay | Admitting: *Deleted

## 2017-03-21 LAB — WOUND CULTURE
MICRO NUMBER:: 81110579
SPECIMEN QUALITY: ADEQUATE

## 2017-03-21 MED ORDER — DOXYCYCLINE HYCLATE 100 MG PO CAPS
100.0000 mg | ORAL_CAPSULE | Freq: Two times a day (BID) | ORAL | 0 refills | Status: DC
Start: 2017-03-21 — End: 2017-05-25

## 2017-03-21 NOTE — Telephone Encounter (Signed)
Ms. Yuhasz notified as instructed by telephone.  Doxycycline prescription sent into [facility] as instructed by Dr. Diona Browner.

## 2017-03-21 NOTE — Telephone Encounter (Signed)
-----   Message from Jinny Sanders, MD sent at 03/21/2017  8:51 AM EDT ----- Notify pt that bacteria is MRSA. Pt needs to stop augmentin and instead change to doxycycline 100 mg BID x 10 days.. Please sens in and let pt know ASAP.

## 2017-03-28 ENCOUNTER — Encounter: Payer: Self-pay | Admitting: Family Medicine

## 2017-03-28 ENCOUNTER — Ambulatory Visit (INDEPENDENT_AMBULATORY_CARE_PROVIDER_SITE_OTHER): Payer: Medicare Other | Admitting: Family Medicine

## 2017-03-28 VITALS — BP 138/60 | HR 86 | Temp 97.9°F | Ht 65.5 in | Wt 177.8 lb

## 2017-03-28 DIAGNOSIS — K219 Gastro-esophageal reflux disease without esophagitis: Secondary | ICD-10-CM | POA: Diagnosis not present

## 2017-03-28 DIAGNOSIS — R1310 Dysphagia, unspecified: Secondary | ICD-10-CM | POA: Insufficient documentation

## 2017-03-28 DIAGNOSIS — Z22322 Carrier or suspected carrier of Methicillin resistant Staphylococcus aureus: Secondary | ICD-10-CM

## 2017-03-28 DIAGNOSIS — R1314 Dysphagia, pharyngoesophageal phase: Secondary | ICD-10-CM

## 2017-03-28 DIAGNOSIS — J340 Abscess, furuncle and carbuncle of nose: Secondary | ICD-10-CM | POA: Diagnosis not present

## 2017-03-28 NOTE — Patient Instructions (Signed)
Start prilosec 20 mg daily x 2-4 weeks to decrease acid in stomach and throat. Stop ibuprofen. Start probiotic (align) daily. Call if difficult swallowing not improved.

## 2017-03-28 NOTE — Assessment & Plan Note (Addendum)
Likely triggered by recent antibitoics. Stop NSAIDs.  Start PPI x 2-4 weeks.. If not improving may need to increase to 40 mg daily.

## 2017-03-28 NOTE — Assessment & Plan Note (Signed)
Likely inflammation due to GERD versus stricture.. If not improving with PPI.Marland Kitchen Refer for endoscopic eval.

## 2017-03-28 NOTE — Progress Notes (Signed)
   Subjective:    Patient ID: Patty Shelton, female    DOB: 1940/11/07, 76 y.o.   MRN: [id number]  HPI   76 year old female presents  For new onset difficultly swallowing x 1 week.   She reports she has noted episodes of burning in throat and chest after drinking hot  Items. No burning in mouth or tounge. She feels food sit in chest.   No pain with swallowing.    In last year she has noted rice getting stuck in throat.  She has to chew really well and drink a lot of water.  She feels food slowing in chest.  no pain with swallowing. Having GERD  Once a week.. Uses antacid prn.   Using ibuprofen daily  Recent treatment for nasal abscess with oral antibiotics. Treated with doxycycline.  She did  augmentin prior to that ( for 3 days, caused diarrhea) Review of Systems     Objective:   Physical Exam  Constitutional: Vital signs are normal. She appears well-developed and well-nourished. She is cooperative.  Non-toxic appearance. She does not appear ill. No distress.  HENT:  Head: Normocephalic.  Right Ear: Hearing, tympanic membrane, external ear and ear canal normal. Tympanic membrane is not erythematous, not retracted and not bulging.  Left Ear: Hearing, tympanic membrane, external ear and ear canal normal. Tympanic membrane is not erythematous, not retracted and not bulging.  Nose: No mucosal edema or rhinorrhea. Right sinus exhibits no maxillary sinus tenderness and no frontal sinus tenderness. Left sinus exhibits no maxillary sinus tenderness and no frontal sinus tenderness.  Mouth/Throat: Uvula is midline, oropharynx is clear and moist and mucous membranes are normal.  No sign of candida in mouth.  Eyes: Pupils are equal, round, and reactive to light. Conjunctivae, EOM and lids are normal. Lids are everted and swept, no foreign bodies found.  Neck: Trachea normal and normal range of motion. Neck supple. Carotid bruit is not present. No thyroid mass and no thyromegaly present.    Cardiovascular: Normal rate, regular rhythm, S1 normal, S2 normal, normal heart sounds, intact distal pulses and normal pulses.  Exam reveals no gallop and no friction rub.   No murmur heard. Pulmonary/Chest: Effort normal and breath sounds normal. No tachypnea. No respiratory distress. She has no decreased breath sounds. She has no wheezes. She has no rhonchi. She has no rales.  Abdominal: Soft. Normal appearance and bowel sounds are normal. There is no tenderness.  Neurological: She is alert.  Skin: Skin is warm, dry and intact. No rash noted.  Psychiatric: Her speech is normal and behavior is normal. Judgment and thought content normal. Her mood appears not anxious. Cognition and memory are normal. She does not exhibit a depressed mood.          Assessment & Plan:

## 2017-03-28 NOTE — Assessment & Plan Note (Signed)
Resolved with doxy.  Precautions for recurrence given. Antibacterial soap.

## 2017-04-10 ENCOUNTER — Other Ambulatory Visit: Payer: Self-pay | Admitting: Family Medicine

## 2017-05-15 ENCOUNTER — Telehealth: Payer: Self-pay | Admitting: Internal Medicine

## 2017-05-15 NOTE — Telephone Encounter (Signed)
Called patient to schedule 6 month F/U CT Chest with ROV to follow. Pt stated that "she was doing fine right now and that she didn't know what her January schedule was". "That if she needed [place] she would call us, but she is not scheduling anything now".    Phone note taken as an FYI for physician and for documentation that pt refused to schedule CT and ROV at this time. Rhonda J Cobb

## 2017-05-25 ENCOUNTER — Ambulatory Visit (INDEPENDENT_AMBULATORY_CARE_PROVIDER_SITE_OTHER): Payer: Medicare Other | Admitting: Family Medicine

## 2017-05-25 ENCOUNTER — Encounter: Payer: Self-pay | Admitting: Family Medicine

## 2017-05-25 ENCOUNTER — Other Ambulatory Visit: Payer: Self-pay

## 2017-05-25 VITALS — BP 130/70 | HR 70 | Temp 98.2°F | Ht 65.5 in | Wt 179.0 lb

## 2017-05-25 DIAGNOSIS — S93601A Unspecified sprain of right foot, initial encounter: Secondary | ICD-10-CM | POA: Diagnosis not present

## 2017-05-25 DIAGNOSIS — Z22322 Carrier or suspected carrier of Methicillin resistant Staphylococcus aureus: Secondary | ICD-10-CM | POA: Diagnosis not present

## 2017-05-25 MED ORDER — MUPIROCIN CALCIUM 2 % NA OINT: TOPICAL_OINTMENT | NASAL | 0 refills | Status: DC

## 2017-05-25 NOTE — Assessment & Plan Note (Signed)
Treat with Ice, elevation and home PT. Consider X-ray if not improving as expected.

## 2017-05-25 NOTE — Progress Notes (Signed)
Subjective:    Patient ID: Patty Shelton, female    DOB: 04/09/1941, 76 y.o.   MRN: [id number]  HPI   76 year old female presents following treatment in 03/28/2017 for MRSA nasal abscess in right nares.  She was treated with  Doxycycline 100 mg BID x 10 days.   She reports  she has continued to have a watery discharge  From bilateraly nostrils.  She has had some congestion and  Bloody mucus.  No sore area.  No fever.  No face pain.   She has also noted 2-3 weeks of soreness on top of right foot.  HAs more prominent blood vessel on right dorsal foot as well.  No pain when up on feet walking around.  No swelling  NSAIDs cause GER.    PPI resolved reflux and burning in throat. Ibuprofen likely triggered.  Social History /Family History/Past Medical History reviewed in detail and updated in EMR if needed. Blood pressure 130/70, pulse 70, temperature 98.2 F (36.8 C), temperature source Oral, height 5' 5.5" (1.664 m), weight 179 lb (81.2 kg).    Review of Systems  Constitutional: Negative for fatigue and fever.  HENT: Negative for congestion.   Eyes: Negative for pain.  Respiratory: Negative for cough and shortness of breath.   Cardiovascular: Negative for chest pain, palpitations and leg swelling.  Gastrointestinal: Negative for abdominal pain.  Genitourinary: Negative for dysuria and vaginal bleeding.  Musculoskeletal: Negative for back pain.  Neurological: Negative for syncope, light-headedness and headaches.  Psychiatric/Behavioral: Negative for dysphoric mood.       Objective:   Physical Exam  Constitutional: Vital signs are normal. She appears well-developed and well-nourished. She is cooperative.  Non-toxic appearance. She does not appear ill. No distress.  HENT:  Head: Normocephalic.  Right Ear: Hearing, tympanic membrane, external ear and ear canal normal. Tympanic membrane is not erythematous, not retracted and not bulging.  Left Ear: Hearing, tympanic  membrane, external ear and ear canal normal. Tympanic membrane is not erythematous, not retracted and not bulging.  Nose: No mucosal edema or rhinorrhea. Right sinus exhibits no maxillary sinus tenderness and no frontal sinus tenderness. Left sinus exhibits no maxillary sinus tenderness and no frontal sinus tenderness.  Mouth/Throat: Uvula is midline, oropharynx is clear and moist and mucous membranes are normal.  Eyes: Conjunctivae, EOM and lids are normal. Pupils are equal, round, and reactive to light. Lids are everted and swept, no foreign bodies found.  Neck: Trachea normal and normal range of motion. Neck supple. Carotid bruit is not present. No thyroid mass and no thyromegaly present.  Cardiovascular: Normal rate, regular rhythm, S1 normal, S2 normal, normal heart sounds, intact distal pulses and normal pulses. Exam reveals no gallop and no friction rub.  No murmur heard. Pulmonary/Chest: Effort normal and breath sounds normal. No tachypnea. No respiratory distress. She has no decreased breath sounds. She has no wheezes. She has no rhonchi. She has no rales.  Abdominal: Soft. Normal appearance and bowel sounds are normal. There is no tenderness.  Musculoskeletal:  Right dorsal foot sore, pain with dorsiflexion.  No redness  Pt with varicose veins  no peripheral edema  Neurological: She is alert.  Skin: Skin is warm, dry and intact. No rash noted.  Psychiatric: Her speech is normal and behavior is normal. Judgment and thought content normal. Her mood appears not anxious. Cognition and memory are normal. She does not exhibit a depressed mood.          Assessment &  Plan:

## 2017-05-25 NOTE — Patient Instructions (Signed)
Elevate right foot, ice and start home PT for foot sprain.  Call in 2 weeks for possible X-ray if not improving.  Start 5 day course of topical antibiotic ointment in bilateral nares.

## 2017-05-25 NOTE — Assessment & Plan Note (Signed)
No current abscess seen. Will treat with mupirocin ointment to treat possible carrier state.

## 2017-06-15 ENCOUNTER — Telehealth: Payer: Self-pay | Admitting: Internal Medicine

## 2017-06-15 NOTE — Telephone Encounter (Signed)
Patient declined appt when called from recall list.  Patient does not want to fu   Deleting recall per patient request

## 2017-07-17 ENCOUNTER — Telehealth: Payer: Self-pay | Admitting: Family Medicine

## 2017-07-17 DIAGNOSIS — M8589 Other specified disorders of bone density and structure, multiple sites: Secondary | ICD-10-CM

## 2017-07-17 DIAGNOSIS — E7800 Pure hypercholesterolemia, unspecified: Secondary | ICD-10-CM

## 2017-07-17 NOTE — Telephone Encounter (Signed)
-----   Message from Eustace Pen, LPN sent at 4/88/8916  8:10 AM EST ----- Regarding: Labs 2/4 Lab orders needed. Thank you.  Insurance:  Medicare - traditional   Resending; forgot to include pt on previous message

## 2017-07-18 ENCOUNTER — Ambulatory Visit (INDEPENDENT_AMBULATORY_CARE_PROVIDER_SITE_OTHER): Payer: Medicare Other

## 2017-07-18 VITALS — BP 126/70 | HR 73 | Temp 98.5°F | Ht 65.5 in | Wt 176.8 lb

## 2017-07-18 DIAGNOSIS — M8589 Other specified disorders of bone density and structure, multiple sites: Secondary | ICD-10-CM

## 2017-07-18 DIAGNOSIS — Z0000 Encounter for general adult medical examination without abnormal findings: Secondary | ICD-10-CM

## 2017-07-18 DIAGNOSIS — E7800 Pure hypercholesterolemia, unspecified: Secondary | ICD-10-CM | POA: Diagnosis not present

## 2017-07-18 LAB — COMPREHENSIVE METABOLIC PANEL
ALT: 18 U/L (ref 0–35)
AST: 18 U/L (ref 0–37)
Albumin: 4.3 g/dL (ref 3.5–5.2)
Alkaline Phosphatase: 63 U/L (ref 39–117)
BUN: 24 mg/dL — ABNORMAL HIGH (ref 6–23)
CALCIUM: 9.6 mg/dL (ref 8.4–10.5)
CO2: 27 meq/L (ref 19–32)
Chloride: 101 mEq/L (ref 96–112)
Creatinine, Ser: 1.12 mg/dL (ref 0.40–1.20)
GFR: 50.12 mL/min — AB (ref >60.00)
Glucose, Bld: 97 mg/dL (ref 70–99)
POTASSIUM: 4.1 meq/L (ref 3.5–5.1)
Sodium: 139 mEq/L (ref 135–145)
Total Bilirubin: 0.4 mg/dL (ref 0.2–1.2)
Total Protein: 7.2 g/dL (ref 6.0–8.3)

## 2017-07-18 LAB — LIPID PANEL
CHOL/HDL RATIO: 5
Cholesterol: 201 mg/dL — ABNORMAL HIGH (ref 0–200)
HDL: 41.6 mg/dL (ref >39.00)
LDL CALC: 136 mg/dL — AB (ref 0–99)
NonHDL: 159.88
TRIGLYCERIDES: 121 mg/dL (ref 0.0–149.0)
VLDL: 24.2 mg/dL (ref 0.0–40.0)

## 2017-07-18 LAB — VITAMIN D 25 HYDROXY (VIT D DEFICIENCY, FRACTURES): VITD: 29.92 ng/mL — ABNORMAL LOW (ref 30.00–100.00)

## 2017-07-18 NOTE — Progress Notes (Signed)
I reviewed health advisor's note, was available for consultation, and agree with documentation and plan.  

## 2017-07-18 NOTE — Patient Instructions (Signed)
Patty Shelton , Thank you for taking time to come for your Medicare Wellness Visit. I appreciate your ongoing commitment to your health goals. Please review the following plan we discussed and let me know if I can assist you in the future.   These are the goals we discussed: Goals    . Follow up with Primary Care Provider     Starting 07/18/2017, I will continue to take medications as prescribed and to keep appointments with PCP as scheduled.        This is a list of the screening recommended for you and due dates:  Health Maintenance  Topic Date Due  . Mammogram  02/23/2018  . Tetanus Vaccine  03/10/2021  . Flu Shot  Completed  . DEXA scan (bone density measurement)  Completed  . Pneumonia vaccines  Completed   Preventive Care for Adults  A healthy lifestyle and preventive care can promote health and wellness. Preventive health guidelines for adults include the following key practices.  . A routine yearly physical is a good way to check with your health care provider about your health and preventive screening. It is a chance to share any concerns and updates on your health and to receive a thorough exam.  . Visit your dentist for a routine exam and preventive care every 6 months. Brush your teeth twice a day and floss once a day. Good oral hygiene prevents tooth decay and gum disease.  . The frequency of eye exams is based on your age, health, family medical history, use  of contact lenses, and other factors. Follow your health care provider's recommendations for frequency of eye exams.  . Eat a healthy diet. Foods like vegetables, fruits, whole grains, low-fat dairy products, and lean protein foods contain the nutrients you need without too many calories. Decrease your intake of foods high in solid fats, added sugars, and salt. Eat the right amount of calories for you. Get information about a proper diet from your health care provider, if necessary.  . Regular physical exercise is one  of the most important things you can do for your health. Most adults should get at least 150 minutes of moderate-intensity exercise (any activity that increases your heart rate and causes you to sweat) each week. In addition, most adults need muscle-strengthening exercises on 2 or more days a week.  Silver Sneakers may be a benefit available to you. To determine eligibility, you may visit the website: www.silversneakers.com or contact program at [phone] Mon-Fri between 8AM-8PM.   . Maintain a healthy weight. The body mass index (BMI) is a screening tool to identify possible weight problems. It provides an estimate of body fat based on height and weight. Your health care provider can find your BMI and can help you achieve or maintain a healthy weight.   For adults 20 years and older: ? A BMI below 18.5 is considered underweight. ? A BMI of 18.5 to 24.9 is normal. ? A BMI of 25 to 29.9 is considered overweight. ? A BMI of 30 and above is considered obese.   . Maintain normal blood lipids and cholesterol levels by exercising and minimizing your intake of saturated fat. Eat a balanced diet with plenty of fruit and vegetables. Blood tests for lipids and cholesterol should begin at age 62 and be repeated every 5 years. If your lipid or cholesterol levels are high, you are over 50, or you are at high risk for heart disease, you may need your cholesterol levels checked  more frequently. Ongoing high lipid and cholesterol levels should be treated with medicines if diet and exercise are not working.  . If you smoke, find out from your health care provider how to quit. If you do not use tobacco, please do not start.  . If you choose to drink alcohol, please do not consume more than 2 drinks per day. One drink is considered to be 12 ounces (355 mL) of beer, 5 ounces (148 mL) of wine, or 1.5 ounces (44 mL) of liquor.  . If you are 27-50 years old, ask your health care provider if you should take aspirin  to prevent strokes.  . Use sunscreen. Apply sunscreen liberally and repeatedly throughout the day. You should seek shade when your shadow is shorter than you. Protect yourself by wearing long sleeves, pants, a wide-brimmed hat, and sunglasses year round, whenever you are outdoors.  . Once a month, do a whole body skin exam, using a mirror to look at the skin on your back. Tell your health care provider of new moles, moles that have irregular borders, moles that are larger than a pencil eraser, or moles that have changed in shape or color.

## 2017-07-18 NOTE — Progress Notes (Signed)
PCP notes:   Health maintenance:  No gaps identified.  Abnormal screenings:   Hearing - failed  Hearing Screening   125Hz  250Hz  500Hz  1000Hz  2000Hz  3000Hz  4000Hz  6000Hz  8000Hz   Right ear:   0 0 40  40    Left ear:   0 0 40  40     Patient concerns:   None  Nurse concerns:  None  Next PCP appt:   07/28/17 @ 1445

## 2017-07-18 NOTE — Progress Notes (Signed)
Subjective:   Patty Shelton is a 77 y.o. female who presents for Medicare Annual (Subsequent) preventive examination.  Review of Systems:  N/A Cardiac Risk Factors include: advanced age (>16men, >67 women);dyslipidemia;hypertension     Objective:     Vitals: BP 126/70 (BP Location: Right Arm, Patient Position: Sitting, Cuff Size: Normal)   Pulse 73   Temp 98.5 F (36.9 C) (Oral)   Ht 5' 5.5" (1.664 m) Comment: no shoes  Wt 176 lb 12 oz (80.2 kg)   SpO2 93%   BMI 28.97 kg/m   Body mass index is 28.97 kg/m.  Advanced Directives 07/18/2017 11/27/2016 05/19/2016  Does Patient Have a Medical Advance Directive? No No No  Would patient like information on creating a medical advance directive? No - Patient declined Yes (ED - Information included in AVS) -    Tobacco Social History   Tobacco Use  Smoking Status Never Smoker  Smokeless Tobacco Never Used     Counseling given: No   Clinical Intake:  Pre-visit preparation completed: Yes  Pain : No/denies pain Pain Score: 0-No pain     Nutritional Status: BMI 25 -29 Overweight Nutritional Risks: None Diabetes: No  How often do you need to have someone help you when you read instructions, pamphlets, or other written materials from your doctor or pharmacy?: 1 - Never What is the last grade level you completed in school?: 12th grade  Interpreter Needed?: No  Comments: pt lives with spouse Information entered by :: LPinson, LPN  Past Medical History:  Diagnosis Date  . Hyperlipemia   . Hypertension    Past Surgical History:  Procedure Laterality Date  . CATARACT EXTRACTION    . RETINAL DETACHMENT SURGERY     Family History  Problem Relation Age of Onset  . Heart attack Mother   . Stroke Father   . Hyperlipidemia Father   . Dementia Father    Social History   Socioeconomic History  . Marital status: Married    Spouse name: None  . Number of children: None  . Years of education: None  . Highest  education level: None  Social Needs  . Financial resource strain: None  . Food insecurity - worry: None  . Food insecurity - inability: None  . Transportation needs - medical: None  . Transportation needs - non-medical: None  Occupational History  . None  Tobacco Use  . Smoking status: Never Smoker  . Smokeless tobacco: Never Used  Substance and Sexual Activity  . Alcohol use: No    Alcohol/week: 0.0 oz  . Drug use: No  . Sexual activity: Yes  Other Topics Concern  . None  Social History Narrative   No living will , no HCPOA. Full code ( reviewed 2015, given packet)    Outpatient Encounter Medications as of 07/18/2017  Medication Sig  . acetaminophen (TYLENOL) 500 MG tablet Take 500 mg by mouth every 6 (six) hours as needed.  Marland Kitchen albuterol (PROVENTIL HFA;VENTOLIN HFA) 108 (90 Base) MCG/ACT inhaler Inhale 2 puffs into the lungs every 6 (six) hours as needed for wheezing or shortness of breath.  . ALPRAZolam (XANAX) 0.25 MG tablet Take 1 tablet (0.25 mg total) by mouth daily as needed for anxiety.  . benzonatate (TESSALON PERLES) 100 MG capsule Take 1 capsule (100 mg total) by mouth 3 (three) times daily. (Patient taking differently: Take 100 mg by mouth as needed. )  . Biotin 10 MG TABS Take 1 tablet by mouth daily.  Marland Kitchen  Calcium Carbonate-Vitamin D (CALCIUM 600+D) 600-400 MG-UNIT per tablet Take 1 tablet by mouth daily.  . cetirizine (ZYRTEC) 10 MG tablet Take 10 mg by mouth daily.   Marland Kitchen CRANBERRY PO Take 1 capsule by mouth daily.  . famciclovir (FAMVIR) 250 MG tablet Take 1 tablet (250 mg total) by mouth daily.  . fluticasone (FLONASE) 50 MCG/ACT nasal spray Place 2 sprays into both nostrils daily.  Marland Kitchen lisinopril-hydrochlorothiazide (PRINZIDE,ZESTORETIC) 10-12.5 MG tablet Take 1 tablet by mouth daily.  . mupirocin nasal ointment (BACTROBAN) 2 % Use small amount in each nostril twice daily for five (5) days. After application, press sides of nose together and gently massage. (Patient taking  differently: as needed. Use small amount in each nostril twice daily for five (5) days. After application, press sides of nose together and gently massage.)  . pravastatin (PRAVACHOL) 40 MG tablet TAKE 1 TABLET DAILY   No facility-administered encounter medications on file as of 07/18/2017.     Activities of Daily Living In your present state of health, do you have any difficulty performing the following activities: 07/18/2017  Hearing? Y  Vision? N  Difficulty concentrating or making decisions? N  Walking or climbing stairs? Y  Dressing or bathing? N  Doing errands, shopping? N  Preparing Food and eating ? N  Using the Toilet? N  In the past six months, have you accidently leaked urine? Y  Do you have problems with loss of bowel control? N  Managing your Medications? N  Managing your Finances? N  Housekeeping or managing your Housekeeping? N  Some recent data might be hidden    Patient Care Team: Patty Sanders, MD as PCP - General Shelton, Patty Hamman, MD as Consulting Physician (Sports Medicine) Patty Bathe, MD as Consulting Physician (Dermatology) Patty Cantor, MD as Consulting Physician (Ophthalmology) Patty Shelton., MD as Consulting Physician (Dentistry) Patty Schwalbe Yvetta Coder, FNP as Nurse Practitioner (Family Medicine)    Assessment:   This is a routine wellness examination for [name].   Hearing Screening   125Hz  250Hz  500Hz  1000Hz  2000Hz  3000Hz  4000Hz  6000Hz  8000Hz   Right ear:   0 0 40  40    Left ear:   0 0 40  40    Vision Screening Comments: Last vision exam in Summer 2018 [facility]  Exercise Activities and Dietary recommendations Current Exercise Habits: The patient does not participate in regular exercise at present, Exercise limited by: None identified  Goals    . Follow up with Primary Care Provider     Starting 07/18/2017, I will continue to take medications as prescribed and to keep appointments with PCP as scheduled.        Fall  Risk Fall Risk  07/18/2017 05/19/2016 05/19/2016 05/22/2015 04/03/2014  Falls in the past year? No Yes No No No  Comment - Emmi Telephone Survey: data to providers prior to load - - -  Number falls in past yr: - 1 - - -  Comment - Emmi Telephone Survey Actual Response = 1 - - -  Injury with Fall? - No - - -   Depression Screen PHQ 2/9 Scores 07/18/2017 05/19/2016 05/22/2015 04/03/2014  PHQ - 2 Score 0 0 0 0  PHQ- 9 Score 0 - - -     Cognitive Function MMSE - Mini Mental State Exam 07/18/2017 05/19/2016  Orientation to time 5 5  Orientation to Place 5 5  Registration 3 3  Attention/ Calculation 0 0  Recall 3 3  Language-  name 2 objects 0 0  Language- repeat 1 1  Language- follow 3 step command 3 3  Language- read & follow direction 0 0  Write a sentence 0 0  Copy design 0 0  Total score 20 20     PLEASE NOTE: A Mini-Cog screen was completed. Maximum score is 20. A value of 0 denotes this part of Folstein MMSE was not completed or the patient failed this part of the Mini-Cog screening.   Mini-Cog Screening Orientation to Time - Max 5 pts Orientation to Place - Max 5 pts Registration - Max 3 pts Recall - Max 3 pts Language Repeat - Max 1 pts Language Follow 3 Step Command - Max 3 pts     Immunization History  Administered Date(s) Administered  . Influenza Split 05/24/2011, 03/01/2013, 04/24/2015  . Influenza Whole 03/27/2009  . Influenza, High Dose Seasonal PF 02/29/2016, 04/07/2017  . Influenza,inj,Quad PF,6+ Mos 04/03/2014  . Pneumococcal Conjugate-13 04/03/2014, 04/07/2017  . Pneumococcal Polysaccharide-23 03/27/2009  . Tdap 03/11/2011  . Zoster 06/02/2014    Screening Tests Health Maintenance  Topic Date Due  . MAMMOGRAM  02/23/2018  . TETANUS/TDAP  03/10/2021  . INFLUENZA VACCINE  Completed  . DEXA SCAN  Completed  . PNA vac Low Risk Adult  Completed      Plan:     I have personally reviewed, addressed, and noted the following in the patient's chart:   A. Medical and social history B. Use of alcohol, tobacco or illicit drugs  C. Current medications and supplements D. Functional ability and status E.  Nutritional status F.  Physical activity G. Advance directives H. List of other physicians I.  Hospitalizations, surgeries, and ER visits in previous 12 months J.  [place] to include hearing, vision, cognitive, depression L. Referrals and appointments - none  In addition, I have reviewed and discussed with patient certain preventive protocols, quality metrics, and best practice recommendations. A written personalized care plan for preventive services as well as general preventive health recommendations were provided to patient.  See attached scanned questionnaire for additional information.   Signed,   Lindell Noe, MHA, BS, LPN Health Coach

## 2017-07-18 NOTE — Progress Notes (Signed)
Pre visit review using our clinic review tool, if applicable. No additional management support is needed unless otherwise documented below in the visit note. 

## 2017-07-28 ENCOUNTER — Ambulatory Visit (INDEPENDENT_AMBULATORY_CARE_PROVIDER_SITE_OTHER): Payer: Medicare Other | Admitting: Family Medicine

## 2017-07-28 ENCOUNTER — Encounter: Payer: Self-pay | Admitting: Family Medicine

## 2017-07-28 ENCOUNTER — Other Ambulatory Visit: Payer: Self-pay

## 2017-07-28 VITALS — BP 100/60 | HR 76 | Temp 98.4°F | Ht 65.5 in | Wt 177.5 lb

## 2017-07-28 DIAGNOSIS — E7800 Pure hypercholesterolemia, unspecified: Secondary | ICD-10-CM | POA: Diagnosis not present

## 2017-07-28 DIAGNOSIS — I10 Essential (primary) hypertension: Secondary | ICD-10-CM

## 2017-07-28 DIAGNOSIS — N183 Chronic kidney disease, stage 3 unspecified: Secondary | ICD-10-CM

## 2017-07-28 MED ORDER — FAMCICLOVIR 250 MG PO TABS: 250.0000 mg | ORAL_TABLET | Freq: Every day | ORAL | 3 refills | Status: DC

## 2017-07-28 MED ORDER — LISINOPRIL-HYDROCHLOROTHIAZIDE 10-12.5 MG PO TABS: 1.0000 | ORAL_TABLET | Freq: Every day | ORAL | 3 refills | Status: DC

## 2017-07-28 MED ORDER — PRAVASTATIN SODIUM 40 MG PO TABS: 40.0000 mg | ORAL_TABLET | Freq: Every day | ORAL | 3 refills | Status: DC

## 2017-07-28 NOTE — Assessment & Plan Note (Signed)
Well controlled. Continue current medication.  

## 2017-07-28 NOTE — Patient Instructions (Signed)
Work on trying to increase walking and regular activity.

## 2017-07-28 NOTE — Progress Notes (Signed)
Subjective:    Patient ID: Patty Shelton, female    DOB: March 25, 1941, 77 y.o.   MRN: [id number]  HPI The patient presents for  complete physical and review of chronic health problems.   The patient saw Candis Musa, LPN for medicare wellness. Note reviewed in detail and important notes copied below. No gaps identified.  Abnormal screenings:   Hearing - failed             Hearing Screening   125Hz  250Hz  500Hz  1000Hz  2000Hz  3000Hz  4000Hz  6000Hz  8000Hz   Right ear:   0 0 40  40    Left ear:   0 0 40  40     07/28/17 TODAY:  Hypertension:   good control on lisinopril HCTZ  BP Readings from Last 3 Encounters:  07/28/17 100/60  07/18/17 126/70  05/25/17 130/70  Using medication without problems or lightheadedness:  none Chest pain with exertion: none Edema:none Short of breath: slight with exertion.. likely deconditioning, alos mild interstitial change noted on last years CT. Average home BPs: Other issues:  Elevated Cholesterol:  On pravastain  40 mg daily  Lab Results  Component Value Date   CHOL 201 (H) 07/18/2017   HDL 41.60 07/18/2017   LDLCALC 136 (H) 07/18/2017   LDLDIRECT 180.6 04/04/2011   TRIG 121.0 07/18/2017   CHOLHDL 5 07/18/2017  Using medications without problems: Muscle aches:  Diet compliance: moderate Exercise: none Other complaints:  CKD stage 3: stable over the last year    Social History /Family History/Past Medical History reviewed in detail and updated in EMR if needed. Blood pressure 100/60, pulse 76, temperature 98.4 F (36.9 C), temperature source Oral, height 5' 5.5" (1.664 m), weight 177 lb 8 oz (80.5 kg).   Review of Systems  Constitutional: Negative for fatigue and fever.  HENT: Negative for congestion.   Eyes: Negative for pain.  Respiratory: Positive for shortness of breath. Negative for cough.        Slight increase in shortness of breath with exertion  Cardiovascular: Negative for chest pain, palpitations  and leg swelling.  Gastrointestinal: Negative for abdominal pain.  Genitourinary: Negative for dysuria and vaginal bleeding.  Musculoskeletal: Negative for back pain.  Neurological: Negative for syncope, light-headedness and headaches.  Psychiatric/Behavioral: Negative for dysphoric mood.       Objective:   Physical Exam  Constitutional: Vital signs are normal. She appears well-developed and well-nourished. She is cooperative.  Non-toxic appearance. She does not appear ill. No distress.  HENT:  Head: Normocephalic.  Right Ear: Hearing, tympanic membrane, external ear and ear canal normal.  Left Ear: Hearing, tympanic membrane, external ear and ear canal normal.  Nose: Nose normal.  Eyes: Conjunctivae, EOM and lids are normal. Pupils are equal, round, and reactive to light. Lids are everted and swept, no foreign bodies found.  Neck: Trachea normal and normal range of motion. Neck supple. Carotid bruit is not present. No thyroid mass and no thyromegaly present.  Cardiovascular: Normal rate, regular rhythm, S1 normal, S2 normal, normal heart sounds and intact distal pulses. Exam reveals no gallop.  No murmur heard. Pulmonary/Chest: Effort normal and breath sounds normal. No respiratory distress. She has no wheezes. She has no rhonchi. She has no rales.  Abdominal: Soft. Normal appearance and bowel sounds are normal. She exhibits no distension, no fluid wave, no abdominal bruit and no mass. There is no hepatosplenomegaly. There is no tenderness. There is no rebound, no guarding and no CVA tenderness. No hernia.  Lymphadenopathy:  She has no cervical adenopathy.    She has no axillary adenopathy.  Neurological: She is alert. She has normal strength. No cranial nerve deficit or sensory deficit.  Skin: Skin is warm, dry and intact. No rash noted.  Psychiatric: Her speech is normal and behavior is normal. Judgment normal. Her mood appears not anxious. Cognition and memory are normal. She does  not exhibit a depressed mood.          Assessment & Plan:  The patient's preventative maintenance and recommended screening tests for an annual wellness exam were reviewed in full today. Brought up to date unless services declined.  Counselled on the importance of diet, exercise, and its role in overall health and mortality. The patient's FH and SH was reviewed, including their home life, tobacco status, and drug and alcohol status.   Vaccines: Uptodate with flu, zoster, PCV 23, 13, Tdap No pap indicated/DVE 2014. No cancer in family. No vaginal bleeding. STOP DVE. Mammogram: 02/2017 nml,  Continue q 2 years. DXA: osteopenia 2012, nml vit D, plan repeat in 5 years... Due.. Not interested at this time. Colon cancer screen: 12/2006. cologuard  2018.Reubin Milan find results?

## 2017-07-28 NOTE — Assessment & Plan Note (Addendum)
Improved LDL significantly on pravastatin, but not yet at goal. No SE to pravastatin. SE to high intensity statin.   Work on lifestyle changes and add exercise.

## 2017-08-31 ENCOUNTER — Telehealth: Payer: Self-pay

## 2017-08-31 ENCOUNTER — Encounter: Payer: Self-pay | Admitting: Emergency Medicine

## 2017-08-31 ENCOUNTER — Emergency Department: Payer: Medicare Other

## 2017-08-31 ENCOUNTER — Emergency Department
Admission: EM | Admit: 2017-08-31 | Discharge: 2017-08-31 | Disposition: A | Discharge status: Home / Self Care | Payer: Medicare Other | Attending: Emergency Medicine | Admitting: Emergency Medicine

## 2017-08-31 DIAGNOSIS — Y999 Unspecified external cause status: Secondary | ICD-10-CM | POA: Insufficient documentation

## 2017-08-31 DIAGNOSIS — I129 Hypertensive chronic kidney disease with stage 1 through stage 4 chronic kidney disease, or unspecified chronic kidney disease: Secondary | ICD-10-CM | POA: Insufficient documentation

## 2017-08-31 DIAGNOSIS — R0781 Pleurodynia: Secondary | ICD-10-CM | POA: Diagnosis not present

## 2017-08-31 DIAGNOSIS — R0789 Other chest pain: Secondary | ICD-10-CM | POA: Insufficient documentation

## 2017-08-31 DIAGNOSIS — N183 Chronic kidney disease, stage 3 (moderate): Secondary | ICD-10-CM | POA: Diagnosis not present

## 2017-08-31 DIAGNOSIS — Z79899 Other long term (current) drug therapy: Secondary | ICD-10-CM | POA: Diagnosis not present

## 2017-08-31 DIAGNOSIS — Y92017 Garden or yard in single-family (private) house as the place of occurrence of the external cause: Secondary | ICD-10-CM | POA: Diagnosis not present

## 2017-08-31 DIAGNOSIS — Y9389 Activity, other specified: Secondary | ICD-10-CM | POA: Insufficient documentation

## 2017-08-31 DIAGNOSIS — W19XXXA Unspecified fall, initial encounter: Secondary | ICD-10-CM | POA: Insufficient documentation

## 2017-08-31 DIAGNOSIS — S299XXA Unspecified injury of thorax, initial encounter: Secondary | ICD-10-CM | POA: Diagnosis not present

## 2017-08-31 MED ORDER — MELOXICAM 15 MG PO TABS: 15.0000 mg | ORAL_TABLET | Freq: Every day | ORAL | 1 refills | Status: AC

## 2017-08-31 MED ORDER — CYCLOBENZAPRINE HCL 5 MG PO TABS: 5.0000 mg | ORAL_TABLET | Freq: Three times a day (TID) | ORAL | 0 refills | Status: AC | PRN

## 2017-08-31 NOTE — ED Triage Notes (Signed)
Pt comes into the ED via POV c/o mechanical fall after tripping.  Patient in NAD at this time and denies any LOC.  Patient fell on her left side and is having pain under the left axilla.  Patient has even and unlabored respirations and is ambulatory to triage.

## 2017-08-31 NOTE — ED Notes (Signed)
Patient transported to X-ray 

## 2017-08-31 NOTE — Telephone Encounter (Signed)
Patty Shelton from [facility] called; pt fell earlier today and thinks she fx a rib; pt having pain in rib cage area and also pressure feeling on lung; Patty Shelton said pt wants to know if she could be seen in office due to long line at [facility] ED; I asked Dr Diona Browner but with pts symptoms she is at the best place ; if pt were seen at [facility] and had xray which was positive then pt would have to go to ED and start over again. Patty Shelton will let pt know. FYI to Dr Diona Browner.

## 2017-08-31 NOTE — ED Provider Notes (Signed)
[facility] Emergency Department Provider Note  ____________________________________________  Time seen: Approximately 5:32 PM  I have reviewed the triage vital signs and the nursing notes.   HISTORY  Chief Complaint Fall    HPI Patty Shelton is a 77 y.o. female presenting to the emergency department with left lateral rib pain after a fall that occurred earlier today while patient was working in the yard.  Patient did not hit her head or lose consciousness.  She reports that left lateral rib pain worsens with deep inspiration.  She denies chest pain or chest tightness.  She denies shortness of breath, nausea, vomiting abdominal pain.  No neck pain, weakness, radiculopathy or changes in sensation in the upper or lower extremities.  No alleviating measures of been attempted.   Past Medical History:  Diagnosis Date  . Hyperlipemia   . Hypertension     Patient Active Problem List   Diagnosis Date Noted  . Foot sprain, right, initial encounter 05/25/2017  . Dysphagia 03/28/2017  . GERD (gastroesophageal reflux disease) 03/28/2017  . MRSA (methicillin resistant staph aureus) culture positive 03/28/2017  . Eustachian tube dysfunction, right 08/04/2016  . Mixed incontinence 06/24/2016  . CKD (chronic kidney disease) stage 3, GFR 30-59 ml/min (HCC) 05/22/2015  . Advance directive discussed with patient 05/22/2015  . Hearing loss 03/01/2013  . Herpes genitalia 04/28/2011  . Osteopenia 03/22/2011  . Hypertension 02/11/2011  . HYPERCHOLESTEROLEMIA 03/27/2009  . ALLERGIC RHINITIS CAUSE UNSPECIFIED 03/27/2009    Past Surgical History:  Procedure Laterality Date  . CATARACT EXTRACTION    . RETINAL DETACHMENT SURGERY      Prior to Admission medications   Medication Sig Start Date End Date Taking? Authorizing Provider  acetaminophen (TYLENOL) 500 MG tablet Take 500 mg by mouth every 6 (six) hours as needed.    [provider]  albuterol (PROVENTIL  HFA;VENTOLIN HFA) 108 (90 Base) MCG/ACT inhaler Inhale 2 puffs into the lungs every 6 (six) hours as needed for wheezing or shortness of breath. 11/27/16   Delman Kitten, MD  ALPRAZolam Duanne Moron) 0.25 MG tablet Take 1 tablet (0.25 mg total) by mouth daily as needed for anxiety. 09/29/15   Bedsole, Amy E, MD  benzonatate (TESSALON PERLES) 100 MG capsule Take 1 capsule (100 mg total) by mouth 3 (three) times daily. Patient taking differently: Take 100 mg by mouth as needed.  12/13/16 12/13/17  Laverle Hobby, MD  Biotin 10 MG TABS Take 1 tablet by mouth daily.    [provider]  Calcium Carbonate-Vitamin D (CALCIUM 600+D) 600-400 MG-UNIT per tablet Take 1 tablet by mouth daily.    [provider]  cetirizine (ZYRTEC) 10 MG tablet Take 10 mg by mouth daily.     [provider]  CRANBERRY PO Take 1 capsule by mouth daily.    [provider]  cyclobenzaprine (FLEXERIL) 5 MG tablet Take 1 tablet (5 mg total) by mouth 3 (three) times daily as needed for up to 3 days for muscle spasms. 08/31/17 09/03/17  Lannie Fields, PA-C  famciclovir (FAMVIR) 250 MG tablet Take 1 tablet (250 mg total) by mouth daily. 07/28/17   Bedsole, Amy E, MD  fluticasone (FLONASE) 50 MCG/ACT nasal spray Place 2 sprays into both nostrils daily.    [provider]  lisinopril-hydrochlorothiazide (PRINZIDE,ZESTORETIC) 10-12.5 MG tablet Take 1 tablet by mouth daily. 07/28/17   Bedsole, Amy E, MD  meloxicam (MOBIC) 15 MG tablet Take 1 tablet (15 mg total) by mouth daily for 7  days. 08/31/17 09/07/17  Lannie Fields, PA-C  mupirocin nasal ointment (BACTROBAN) 2 % Use small amount in each nostril twice daily for five (5) days. After application, press sides of nose together and gently massage. Patient taking differently: as needed. Use small amount in each nostril twice daily for five (5) days. After application, press sides of nose together and gently massage. 05/25/17   Bedsole, Amy E, MD   pravastatin (PRAVACHOL) 40 MG tablet Take 1 tablet (40 mg total) by mouth daily. 07/28/17   Jinny Sanders, MD    Allergies Lipitor [atorvastatin]  Family History  Problem Relation Age of Onset  . Heart attack Mother   . Stroke Father   . Hyperlipidemia Father   . Dementia Father     Social History Social History   Tobacco Use  . Smoking status: Never Smoker  . Smokeless tobacco: Never Used  Substance Use Topics  . Alcohol use: No    Alcohol/week: 0.0 oz  . Drug use: No     Review of Systems  Constitutional: No fever/chills Eyes: No visual changes. No discharge ENT: No upper respiratory complaints. Cardiovascular: no chest pain. Respiratory: no cough. No SOB. Gastrointestinal: No abdominal pain.  No nausea, no vomiting.  No diarrhea.  No constipation. Musculoskeletal: Patient has left lateral rib pain.  Skin: Negative for rash, abrasions, lacerations, ecchymosis. Neurological: Negative for headaches, focal weakness or numbness.   ____________________________________________   PHYSICAL EXAM:  VITAL SIGNS: ED Triage Vitals [08/31/17 1536]  Enc Vitals Group     BP (!) 157/66     Pulse Rate 73     Resp 16     Temp 98.2 F (36.8 C)     Temp Source Oral     SpO2 96 %     Weight 175 lb (79.4 kg)     Height 5\' 6"  (1.676 m)     Head Circumference      Peak Flow      Pain Score 2     Pain Loc      Pain Edu?      Excl. in [place]?      Constitutional: Alert and oriented. Well appearing and in no acute distress. Eyes: Conjunctivae are normal. PERRL. EOMI. Head: Atraumatic. ENT:      Ears: TMs are pearly.      Nose: No congestion/rhinnorhea.      Mouth/Throat: Mucous membranes are moist.  Neck: No stridor.  No cervical spine tenderness to palpation.  Cardiovascular: Normal rate, regular rhythm. Normal S1 and S2.  Good peripheral circulation. Respiratory: Normal respiratory effort without tachypnea or retractions. Lungs CTAB. Good air entry to the bases with no  decreased or absent breath sounds. Gastrointestinal: Bowel sounds 4 quadrants. Soft and nontender to palpation. No guarding or rigidity. No palpable masses. No distention. No CVA tenderness. Musculoskeletal: Full range of motion to all extremities. No gross deformities appreciated.  Patient has tenderness elicited with palpation over left lateral ribs. Neurologic:  Normal speech and language. No gross focal neurologic deficits are appreciated.  Skin:  Skin is warm, dry and intact. No rash noted.  ____________________________________________   LABS (all labs ordered are listed, but only abnormal results are displayed)  Labs Reviewed - No data to display ____________________________________________  EKG   ____________________________________________  RADIOLOGY Unk Pinto, personally viewed and evaluated these images (plain radiographs) as part of my medical decision making, as well as reviewing the written report by the radiologist.  Dg Ribs Unilateral  W/chest Left  Result Date: 08/31/2017 CLINICAL DATA:  Mechanical fall, tripped and fell landing on LEFT side, pain under LEFT axilla, no loss of consciousness EXAM: LEFT RIBS AND CHEST - 3+ VIEW COMPARISON:  Chest radiograph and chest CT exams of 11/27/2016 FINDINGS: Normal heart size, mediastinal contours, and pulmonary vascularity. Mild tortuosity of thoracic aorta. Minimal LEFT basilar atelectasis. Lungs otherwise clear. No pulmonary infiltrate, pleural effusion, or pneumothorax. Bones demineralized. No rib fracture or bone destruction. IMPRESSION: Minimal LEFT basilar atelectasis. No acute LEFT rib abnormalities. Electronically Signed   By: Lavonia Dana M.D.   On: 08/31/2017 16:24    ____________________________________________    PROCEDURES  Procedure(s) performed:    Procedures    Medications - No data to display   ____________________________________________   INITIAL IMPRESSION / ASSESSMENT AND PLAN / ED  COURSE  Pertinent labs & imaging results that were available during my care of the patient were reviewed by me and considered in my medical decision making (see chart for details).  Review of the [place] CSRS was performed in accordance of the [place] prior to dispensing any controlled drugs.    Assessment and plan Fall Patient presents to the emergency department with left lateral rib pain after a fall that occurred today.  Differential diagnosis included pneumothorax, fracture and intercostal muscle contusion.  No acute fractures were identified on x-ray.  No pneumothorax.  Patient was discharged with a short course of Flexeril and meloxicam after patient assured me that she has no history of GI bleeds and can tolerate occasional use of ibuprofen.  Vital signs are reassuring prior to discharge.  All patient questions were answered.   ____________________________________________  FINAL CLINICAL IMPRESSION(S) / ED DIAGNOSES  Final diagnoses:  Fall, initial encounter      NEW MEDICATIONS STARTED DURING THIS VISIT:  ED Discharge Orders        Ordered    cyclobenzaprine (FLEXERIL) 5 MG tablet  3 times daily PRN     08/31/17 1718    meloxicam (MOBIC) 15 MG tablet  Daily     08/31/17 1718          This chart was dictated using voice recognition software/Dragon. Despite best efforts to proofread, errors can occur which can change the meaning. Any change was purely unintentional.    Lannie Fields, PA-C 08/31/17 [place], [place], MD 08/31/17 2352

## 2017-09-08 ENCOUNTER — Ambulatory Visit (INDEPENDENT_AMBULATORY_CARE_PROVIDER_SITE_OTHER): Payer: Medicare Other | Admitting: Family Medicine

## 2017-09-08 ENCOUNTER — Encounter: Payer: Self-pay | Admitting: Family Medicine

## 2017-09-08 ENCOUNTER — Ambulatory Visit (INDEPENDENT_AMBULATORY_CARE_PROVIDER_SITE_OTHER)
Admission: RE | Admit: 2017-09-08 | Discharge: 2017-09-08 | Disposition: A | Discharge status: Home / Self Care | Payer: Medicare Other | Source: Ambulatory Visit | Attending: Family Medicine | Admitting: Family Medicine

## 2017-09-08 ENCOUNTER — Other Ambulatory Visit: Payer: Self-pay | Admitting: Family Medicine

## 2017-09-08 ENCOUNTER — Other Ambulatory Visit: Payer: Self-pay

## 2017-09-08 ENCOUNTER — Telehealth: Payer: Self-pay | Admitting: *Deleted

## 2017-09-08 DIAGNOSIS — R0789 Other chest pain: Secondary | ICD-10-CM | POA: Insufficient documentation

## 2017-09-08 DIAGNOSIS — W19XXXD Unspecified fall, subsequent encounter: Secondary | ICD-10-CM | POA: Diagnosis not present

## 2017-09-08 DIAGNOSIS — S92532A Displaced fracture of distal phalanx of left lesser toe(s), initial encounter for closed fracture: Secondary | ICD-10-CM | POA: Diagnosis not present

## 2017-09-08 DIAGNOSIS — R0781 Pleurodynia: Secondary | ICD-10-CM | POA: Diagnosis not present

## 2017-09-08 DIAGNOSIS — M79672 Pain in left foot: Secondary | ICD-10-CM | POA: Diagnosis not present

## 2017-09-08 DIAGNOSIS — W19XXXA Unspecified fall, initial encounter: Secondary | ICD-10-CM | POA: Insufficient documentation

## 2017-09-08 DIAGNOSIS — S92302A Fracture of unspecified metatarsal bone(s), left foot, initial encounter for closed fracture: Secondary | ICD-10-CM

## 2017-09-08 NOTE — Telephone Encounter (Signed)
Copied from [place] [id number]. Topic: Inquiry >> Sep 08, 2017 12:21 PM Pricilla Handler wrote: Reason for CRM: Patient called requesting x-ray results from Dr. Diona Browner from her visit this morning. Please call patient today.       Thank You!!!

## 2017-09-08 NOTE — Progress Notes (Signed)
Pt not in office .Marland Kitchen Please set up today if possible and call pt at home.

## 2017-09-08 NOTE — Progress Notes (Signed)
Subjective:    Patient ID: Patty Shelton, female    DOB: [birth year], 77 y.o.   MRN: [id number]  HPI    77 year old female presetns for ER follow up.   She went to ER on 3/21 following an accidental fall while working in the yard at [employer]. No proceeding symtpoms. Per ER note: Left lateral rib pain. Patient did not hit her head or lose consciousness.  She reports that left lateral rib pain worsens with deep inspiration.  She denies chest pain or chest tightness.  She denies shortness of breath, nausea, vomiting abdominal pain.  No neck pain, weakness, radiculopathy or changes in sensation in the upper or lower extremities.   Xray: showed no rib issues. Patient was discharged with a short course of Flexeril and meloxicam   Today she reports:   Pain in left rib cage is entirely gone. Minimal left hip pain   Now she reports additional injury when she fell..  Forced inversion of ankle.Left foot pain. Resolving bruising and swelling left foot and ankle.  Pain in left lalteral foot and dorsal foot. No pain in ankle.  No pain at night with sleeping.  Pain with weight bearing and walking. She reports since injury pain is not much better, swelling is some better.  She has been icing and soaking in epsom salts.     Review of Systems  Constitutional: Negative for fatigue and fever.  HENT: Negative for congestion.   Eyes: Negative for pain.  Respiratory: Negative for cough and shortness of breath.   Cardiovascular: Negative for chest pain, palpitations and leg swelling.  Gastrointestinal: Negative for abdominal pain.  Genitourinary: Negative for dysuria and vaginal bleeding.  Musculoskeletal: Negative for back pain.  Neurological: Negative for syncope, light-headedness and headaches.  Psychiatric/Behavioral: Negative for dysphoric mood.       Objective:   Physical Exam  Constitutional: Vital signs are normal. She appears well-developed and well-nourished. She is cooperative.   Non-toxic appearance. She does not appear ill. No distress.  HENT:  Head: Normocephalic.  Right Ear: Hearing, tympanic membrane, external ear and ear canal normal. Tympanic membrane is not erythematous, not retracted and not bulging.  Left Ear: Hearing, tympanic membrane, external ear and ear canal normal. Tympanic membrane is not erythematous, not retracted and not bulging.  Nose: No mucosal edema or rhinorrhea. Right sinus exhibits no maxillary sinus tenderness and no frontal sinus tenderness. Left sinus exhibits no maxillary sinus tenderness and no frontal sinus tenderness.  Mouth/Throat: Uvula is midline, oropharynx is clear and moist and mucous membranes are normal.  Eyes: Pupils are equal, round, and reactive to light. Conjunctivae, EOM and lids are normal. Lids are everted and swept, no foreign bodies found.  Neck: Trachea normal and normal range of motion. Neck supple. Carotid bruit is not present. No thyroid mass and no thyromegaly present.  Cardiovascular: Normal rate, regular rhythm, S1 normal, S2 normal, normal heart sounds, intact distal pulses and normal pulses. Exam reveals no gallop and no friction rub.  No murmur heard. Pulmonary/Chest: Effort normal and breath sounds normal. No tachypnea. No respiratory distress. She has no decreased breath sounds. She has no wheezes. She has no rhonchi. She has no rales.  Abdominal: Soft. Normal appearance and bowel sounds are normal. There is no tenderness.  Musculoskeletal:  No chest wall pain.   ttp over left dorsolateral foot, swelling diffusely I foot and ankle on left, resolving bruising.  Full ROM of ankle... Only mild anterior lateral ankle pain  Neurological: She is alert.  Skin: Skin is warm, dry and intact. No rash noted.  Psychiatric: Her speech is normal and behavior is normal. Judgment and thought content normal. Her mood appears not anxious. Cognition and memory are normal. She does not exhibit a depressed mood.            Assessment & Plan:

## 2017-09-08 NOTE — Assessment & Plan Note (Addendum)
Eval with X-ray given age and hx of osteopenia.  Concern for fracture in 3 and 4th metatarsal heads... Will await radiology reading of X-ray, but plan referral ASAP to podiatry. Treat with NSAIDs, ice , post op shoe and elevation.  Follow up if not improving as expected in 2 weeks.

## 2017-09-08 NOTE — Telephone Encounter (Signed)
Foot x-ray results discussed with patient via telephone.  See result note on foot x-ray from today 09/08/2017.

## 2017-09-08 NOTE — Assessment & Plan Note (Signed)
No fracture.. resolved

## 2017-09-08 NOTE — Patient Instructions (Addendum)
Use meloxicam daily as needed for pain and swelling, elevate foot, ice . Given concern for fracture... We will await radiology reading and likely refer to podiatry for treatment of possible 3rd and 4th metatarsal head fracture.

## 2017-09-11 ENCOUNTER — Ambulatory Visit (INDEPENDENT_AMBULATORY_CARE_PROVIDER_SITE_OTHER): Payer: Medicare Other | Admitting: Podiatry

## 2017-09-11 ENCOUNTER — Encounter: Payer: Self-pay | Admitting: Podiatry

## 2017-09-11 VITALS — BP 157/70 | HR 70 | Resp 16

## 2017-09-11 DIAGNOSIS — S92335A Nondisplaced fracture of third metatarsal bone, left foot, initial encounter for closed fracture: Secondary | ICD-10-CM | POA: Diagnosis not present

## 2017-09-11 DIAGNOSIS — S92345A Nondisplaced fracture of fourth metatarsal bone, left foot, initial encounter for closed fracture: Secondary | ICD-10-CM

## 2017-09-11 NOTE — Progress Notes (Signed)
Subjective:  Patient ID: Patty Shelton, female    DOB: 09/12/1940,  MRN: [id number] HPI Chief Complaint  Patient presents with  . Foot Pain    Forefoot left - patient fell 10 days ago, swelling, PCP xrayed-said had fractures of 3rd and 4th toes/mets?, still painful to walk, PCP referred here for treatment  . New Patient (Initial Visit)    77 y.o. female presents with the above complaint.    ROS: Denies fever chills nausea vomiting muscle aches pains calf pain shortness of breath chest pain and headache.  Past Medical History:  Diagnosis Date  . Hyperlipemia   . Hypertension    Past Surgical History:  Procedure Laterality Date  . CATARACT EXTRACTION    . RETINAL DETACHMENT SURGERY      Current Outpatient Medications:  .  acetaminophen (TYLENOL) 500 MG tablet, Take 500 mg by mouth every 6 (six) hours as needed., Disp: , Rfl:  .  albuterol (PROVENTIL HFA;VENTOLIN HFA) 108 (90 Base) MCG/ACT inhaler, Inhale 2 puffs into the lungs every 6 (six) hours as needed for wheezing or shortness of breath., Disp: 1 Inhaler, Rfl: 2 .  ALPRAZolam (XANAX) 0.25 MG tablet, Take 1 tablet (0.25 mg total) by mouth daily as needed for anxiety., Disp: 20 tablet, Rfl: 0 .  Biotin 10 MG TABS, Take 1 tablet by mouth daily., Disp: , Rfl:  .  Calcium Carbonate-Vitamin D (CALCIUM 600+D) 600-400 MG-UNIT per tablet, Take 1 tablet by mouth daily., Disp: , Rfl:  .  cetirizine (ZYRTEC) 10 MG tablet, Take 10 mg by mouth daily. , Disp: , Rfl:  .  CRANBERRY PO, Take 1 capsule by mouth daily., Disp: , Rfl:  .  famciclovir (FAMVIR) 250 MG tablet, Take 1 tablet (250 mg total) by mouth daily., Disp: 90 tablet, Rfl: 3 .  fluticasone (FLONASE) 50 MCG/ACT nasal spray, Place 2 sprays into both nostrils daily., Disp: , Rfl:  .  lisinopril-hydrochlorothiazide (PRINZIDE,ZESTORETIC) 10-12.5 MG tablet, Take 1 tablet by mouth daily., Disp: 90 tablet, Rfl: 3 .  mupirocin nasal ointment (BACTROBAN) 2 %, Use small amount in each  nostril twice daily for five (5) days. After application, press sides of nose together and gently massage. (Patient taking differently: as needed. Use small amount in each nostril twice daily for five (5) days. After application, press sides of nose together and gently massage.), Disp: 10 g, Rfl: 0 .  pravastatin (PRAVACHOL) 40 MG tablet, Take 1 tablet (40 mg total) by mouth daily., Disp: 90 tablet, Rfl: 3  Allergies  Allergen Reactions  . Lipitor [Atorvastatin] Other (See Comments)    Aches at 40mg  dose Leg cramps    Review of Systems Objective:   Vitals:   09/11/17 1317  BP: (!) 157/70  Pulse: 70  Resp: 16    General: Well developed, nourished, in no acute distress, alert and oriented x3   Dermatological: Skin is warm, dry and supple bilateral. Nails x 10 are well maintained; remaining integument appears unremarkable at this time. There are no open sores, no preulcerative lesions, no rash or signs of infection present.  Vascular: Dorsalis Pedis artery and Posterior Tibial artery pedal pulses are 2/4 bilateral with immedate capillary fill time. Pedal hair growth present. No varicosities and no lower extremity edema present bilateral.   Neruologic: Grossly intact via light touch bilateral. Vibratory intact via tuning fork bilateral. Protective threshold with Semmes Wienstein monofilament intact to all pedal sites bilateral. Patellar and Achilles deep tendon reflexes 2+ bilateral. No Babinski or clonus  noted bilateral.   Musculoskeletal: No gross boney pedal deformities bilateral. No pain, crepitus, or limitation noted with foot and ankle range of motion bilateral. Muscular strength 5/5 in all groups tested bilateral.  Mild edema overlying the forefoot left with mild erythema.  No ecchymosis is noted.  She has tenderness on palpation of the third and fourth metatarsal heads.  Gait: Unassisted, Nonantalgic.    Radiographs:  Radiographs were reviewed today that were taken by her  primary provider I concur with the radiologist that there are fractures to the third and fourth metatarsal heads at the surgical neck.  Assessment & Plan:   Assessment: Fractured metatarsals #3 #4 the left foot.    Plan: Place her in an anklet for compression and placed her in a Cam walker short for the next 4 weeks.  Another set of x-rays will be performed at that time.  Should she have questions or concerns she will notify us immediately.  I did give her a warning today about the rubber on the bottom of the Cam walker that it could cause her to trip so I expressed to her that she needs to make sure that she will pick her feet up.       Max T. [place], [place]

## 2017-09-28 ENCOUNTER — Ambulatory Visit (INDEPENDENT_AMBULATORY_CARE_PROVIDER_SITE_OTHER): Payer: Medicare Other | Admitting: Family Medicine

## 2017-09-28 ENCOUNTER — Encounter: Payer: Self-pay | Admitting: Family Medicine

## 2017-09-28 ENCOUNTER — Other Ambulatory Visit: Payer: Self-pay

## 2017-09-28 VITALS — BP 130/70 | HR 77 | Temp 98.4°F | Ht 65.5 in | Wt 178.2 lb

## 2017-09-28 DIAGNOSIS — J309 Allergic rhinitis, unspecified: Secondary | ICD-10-CM

## 2017-09-28 DIAGNOSIS — B9789 Other viral agents as the cause of diseases classified elsewhere: Secondary | ICD-10-CM

## 2017-09-28 DIAGNOSIS — J069 Acute upper respiratory infection, unspecified: Secondary | ICD-10-CM | POA: Diagnosis not present

## 2017-09-28 MED ORDER — GUAIFENESIN-CODEINE 100-10 MG/5ML PO SYRP: 5.0000 mL | ORAL_SOLUTION | Freq: Every evening | ORAL | 0 refills | Status: DC | PRN

## 2017-09-28 NOTE — Patient Instructions (Signed)
Continue guaifenesin during the day like mucinex/delsym   Continue zyrtec.  Add flonase 2 spray per nostril daily.  Can use cough suppressant  at night.  Rest, fluids.  Call if fever, shortness of breath.. Expect symptoms to last 5-7 days.

## 2017-09-28 NOTE — Progress Notes (Signed)
   Subjective:    Patient ID: Patty Shelton, female    DOB: 03/20/1941, 77 y.o.   MRN: [id number]  Cough  This is a new problem. The current episode started 1 to 4 weeks ago. The cough is productive of sputum (light green sputum). Associated symptoms include ear pain, nasal congestion and a sore throat. Pertinent negatives include no chills, fever, myalgias, shortness of breath or wheezing. Associated symptoms comments:  Left ear pain, sorethroat off and on No sinus pain. The symptoms are aggravated by lying down. Risk factors: nosmoker. Treatments tried: mucinex DM, sucrets, delsym day and night. The treatment provided mild relief. Her past medical history is significant for environmental allergies. There is no history of asthma, bronchitis or COPD.     Blood pressure 130/70, pulse 77, temperature 98.4 F (36.9 C), temperature source Oral, height 5' 5.5" (1.664 m), weight 178 lb 4 oz (80.9 kg), SpO2 93 %.   Review of Systems  Constitutional: Negative for chills and fever.  HENT: Positive for ear pain and sore throat.   Respiratory: Positive for cough. Negative for shortness of breath and wheezing.   Musculoskeletal: Negative for myalgias.  Allergic/Immunologic: Positive for environmental allergies.       Objective:   Physical Exam  Constitutional: Vital signs are normal. She appears well-developed and well-nourished. She is cooperative.  Non-toxic appearance. She does not appear ill. No distress.  HENT:  Head: Normocephalic.  Right Ear: Hearing, tympanic membrane, external ear and ear canal normal. Tympanic membrane is not erythematous, not retracted and not bulging.  Left Ear: Hearing, tympanic membrane, external ear and ear canal normal. Tympanic membrane is not erythematous, not retracted and not bulging.  Nose: Mucosal edema and rhinorrhea present. Right sinus exhibits no maxillary sinus tenderness and no frontal sinus tenderness. Left sinus exhibits no maxillary sinus tenderness  and no frontal sinus tenderness.  Mouth/Throat: Uvula is midline, oropharynx is clear and moist and mucous membranes are normal.  Eyes: Pupils are equal, round, and reactive to light. Conjunctivae, EOM and lids are normal. Lids are everted and swept, no foreign bodies found.  Neck: Trachea normal and normal range of motion. Neck supple. Carotid bruit is not present. No thyroid mass and no thyromegaly present.  Cardiovascular: Normal rate, regular rhythm, S1 normal, S2 normal, normal heart sounds, intact distal pulses and normal pulses. Exam reveals no gallop and no friction rub.  No murmur heard. Pulmonary/Chest: Effort normal and breath sounds normal. No tachypnea. No respiratory distress. She has no decreased breath sounds. She has no wheezes. She has no rhonchi. She has no rales.  Neurological: She is alert.  Skin: Skin is warm, dry and intact. No rash noted.  Psychiatric: Her speech is normal and behavior is normal. Judgment normal. Her mood appears not anxious. Cognition and memory are normal. She does not exhibit a depressed mood.          Assessment & Plan:

## 2017-09-28 NOTE — Assessment & Plan Note (Signed)
Add flonase to zyrtec.

## 2017-09-28 NOTE — Assessment & Plan Note (Signed)
No sign of bacterial infection.. Symptomatic care.

## 2017-10-04 DIAGNOSIS — H338 Other retinal detachments: Secondary | ICD-10-CM | POA: Diagnosis not present

## 2017-10-04 DIAGNOSIS — Z961 Presence of intraocular lens: Secondary | ICD-10-CM | POA: Diagnosis not present

## 2017-10-04 DIAGNOSIS — H04123 Dry eye syndrome of bilateral lacrimal glands: Secondary | ICD-10-CM | POA: Diagnosis not present

## 2017-10-04 DIAGNOSIS — H524 Presbyopia: Secondary | ICD-10-CM | POA: Diagnosis not present

## 2017-10-04 DIAGNOSIS — H43393 Other vitreous opacities, bilateral: Secondary | ICD-10-CM | POA: Diagnosis not present

## 2017-10-06 ENCOUNTER — Other Ambulatory Visit: Payer: Self-pay

## 2017-10-06 ENCOUNTER — Encounter: Payer: Self-pay | Admitting: Family Medicine

## 2017-10-06 ENCOUNTER — Ambulatory Visit (INDEPENDENT_AMBULATORY_CARE_PROVIDER_SITE_OTHER): Payer: Medicare Other | Admitting: Family Medicine

## 2017-10-06 DIAGNOSIS — J209 Acute bronchitis, unspecified: Secondary | ICD-10-CM

## 2017-10-06 MED ORDER — HYDROCODONE-HOMATROPINE 5-1.5 MG/5ML PO SYRP: 5.0000 mL | ORAL_SOLUTION | Freq: Every evening | ORAL | 0 refills | Status: DC | PRN

## 2017-10-06 MED ORDER — AZITHROMYCIN 250 MG PO TABS: ORAL_TABLET | ORAL | 0 refills | Status: DC

## 2017-10-06 NOTE — Patient Instructions (Addendum)
Can complete course of antibiotics.  Use cough suppressant at night.  Rest fluids. Call if shrotness of breath or fever on antbiotics.

## 2017-10-06 NOTE — Assessment & Plan Note (Addendum)
Rest, fluids.  Complete antibitics course for persistent cough and possible atypicial bacterial infection.  Cough suppressant as needed.. Requested stronger med than codeine as that helped minimally.

## 2017-10-06 NOTE — Progress Notes (Signed)
   Subjective:    Patient ID: Patty Shelton, female    DOB: Nov 14, 1940, 77 y.o.   MRN: [id number]  HPI  77 year old female presents with continued cough and congestion.  Last seen 4/8 Dx with viral URI and allergies. Symptomatic care.    Was feeling better 4 days ago but then sympmtos returned again. She has severe sore throat, Constant cough, productive, fits of coughing.  Minimal SOB. No face pain, no ear pain.  Nasal congestion.  No fever.    She is using mucinex. Cough suppressant  Using flonase and zyrtec. Social History /Family History/Past Medical History reviewed in detail and updated in EMR if needed. Blood pressure 128/60, pulse 80, temperature 98.6 F (37 C), temperature source Oral, height 5' 5.5" (1.664 m), weight 179 lb 4 oz (81.3 kg), SpO2 93 %.  Review of Systems  Constitutional: Negative for fatigue and fever.  HENT: Negative for congestion.   Eyes: Negative for pain.  Respiratory: Negative for cough and shortness of breath.   Cardiovascular: Negative for chest pain, palpitations and leg swelling.  Gastrointestinal: Negative for abdominal pain.  Genitourinary: Negative for dysuria and vaginal bleeding.  Musculoskeletal: Negative for back pain.  Neurological: Negative for syncope, light-headedness and headaches.  Psychiatric/Behavioral: Negative for dysphoric mood.       Objective:   Physical Exam  Constitutional: Vital signs are normal. She appears well-developed and well-nourished. She is cooperative.  Non-toxic appearance. She does not appear ill. No distress.  HENT:  Head: Normocephalic.  Right Ear: Hearing, tympanic membrane, external ear and ear canal normal. Tympanic membrane is not erythematous, not retracted and not bulging.  Left Ear: Hearing, tympanic membrane, external ear and ear canal normal. Tympanic membrane is not erythematous, not retracted and not bulging.  Nose: Mucosal edema and rhinorrhea present. Right sinus exhibits no maxillary  sinus tenderness and no frontal sinus tenderness. Left sinus exhibits no maxillary sinus tenderness and no frontal sinus tenderness.  Mouth/Throat: Uvula is midline, oropharynx is clear and moist and mucous membranes are normal.  Eyes: Pupils are equal, round, and reactive to light. Conjunctivae, EOM and lids are normal. Lids are everted and swept, no foreign bodies found.  Neck: Trachea normal and normal range of motion. Neck supple. Carotid bruit is not present. No thyroid mass and no thyromegaly present.  Cardiovascular: Normal rate, regular rhythm, S1 normal, S2 normal, normal heart sounds, intact distal pulses and normal pulses. Exam reveals no gallop and no friction rub.  No murmur heard. Pulmonary/Chest: Effort normal and breath sounds normal. No tachypnea. No respiratory distress. She has no decreased breath sounds. She has no wheezes. She has no rhonchi. She has no rales.   Constant dry cough  Neurological: She is alert.  Skin: Skin is warm, dry and intact. No rash noted.  Psychiatric: Her speech is normal and behavior is normal. Judgment normal. Her mood appears not anxious. Cognition and memory are normal. She does not exhibit a depressed mood.          Assessment & Plan:

## 2017-10-11 ENCOUNTER — Encounter: Payer: Medicare Other | Admitting: Podiatry

## 2017-10-12 ENCOUNTER — Ambulatory Visit (INDEPENDENT_AMBULATORY_CARE_PROVIDER_SITE_OTHER): Payer: Medicare Other | Admitting: Podiatry

## 2017-10-12 ENCOUNTER — Encounter: Payer: Self-pay | Admitting: Podiatry

## 2017-10-12 ENCOUNTER — Ambulatory Visit (INDEPENDENT_AMBULATORY_CARE_PROVIDER_SITE_OTHER): Payer: Medicare Other

## 2017-10-12 DIAGNOSIS — S92345A Nondisplaced fracture of fourth metatarsal bone, left foot, initial encounter for closed fracture: Secondary | ICD-10-CM

## 2017-10-12 DIAGNOSIS — S92912D Unspecified fracture of left toe(s), subsequent encounter for fracture with routine healing: Secondary | ICD-10-CM

## 2017-10-12 DIAGNOSIS — S92335A Nondisplaced fracture of third metatarsal bone, left foot, initial encounter for closed fracture: Secondary | ICD-10-CM | POA: Diagnosis not present

## 2017-10-12 NOTE — Progress Notes (Signed)
This patient returns to the office for an evaluation of her left foot.  She has been diagnosed with metatarsal fractures of the third and fourth metatarsal left foot.  She has been wearing a Cam Walker since he was visited this office on April 1.  She says that her foo but she states that she still needs to be wearing her boot.  She presents the office today for continued evaluation and treatment of her fractured left foot.  General Appearance  Alert, conversant and in no acute stress.  Vascular  Dorsalis pedis and posterior tibial  pulses are palpable  bilaterally.  Capillary return is within normal limits  bilaterally. Temperature is within normal limits  bilaterally.  Neurologic  Senn-Weinstein monofilament wire test within normal limits  bilaterally. Muscle power within normal limits bilaterally.  Nails Normal nails noted with no evidence of bacterial or fungal infection  Orthopedic  No limitations of motion of motion feet .  No crepitus or effusions noted.  No bony pathology or digital deformities noted. Patient has generalized swelling over the second, third and fourth metatarsals of the left foot.  Mild redness noted at the sites.  She has increased temperature over the left forefoot than the right forefoot  Skin  normotropic skin with no porokeratosis noted bilaterally.  No signs of infections or ulcers noted.    Fracture, third and fourth metatarsals, left foot.  ROV.  X-rays taken do reveal healing noted at the fracture sites and good alignment of the metatarsal parabola following the fractures.  There is calcification noted at the insertion of the plantar fascia of the left foot.  Discussed this condition with this patient.  We discussed her injury  was approximately 6 weeks ago.  She  would benefit from continuing to walk in the [facility], but she says it is heavy and awkward.  I told the patient. It takes bone 6-12 weeks to heal.  I suggested she continue to use the Cam Walker until her  pain is gone and then she can start wearing a surgical shoe that was dispensed today.  RTC 4 weeks.   Gardiner Barefoot DPM

## 2017-11-01 NOTE — Progress Notes (Signed)
This encounter was created in error - please disregard.

## 2017-11-13 ENCOUNTER — Ambulatory Visit (INDEPENDENT_AMBULATORY_CARE_PROVIDER_SITE_OTHER): Payer: Medicare Other

## 2017-11-13 ENCOUNTER — Ambulatory Visit (INDEPENDENT_AMBULATORY_CARE_PROVIDER_SITE_OTHER): Payer: Medicare Other | Admitting: Podiatry

## 2017-11-13 ENCOUNTER — Encounter: Payer: Self-pay | Admitting: Podiatry

## 2017-11-13 DIAGNOSIS — S92912D Unspecified fracture of left toe(s), subsequent encounter for fracture with routine healing: Secondary | ICD-10-CM

## 2017-11-13 DIAGNOSIS — S92335A Nondisplaced fracture of third metatarsal bone, left foot, initial encounter for closed fracture: Secondary | ICD-10-CM

## 2017-11-13 DIAGNOSIS — S92345A Nondisplaced fracture of fourth metatarsal bone, left foot, initial encounter for closed fracture: Secondary | ICD-10-CM

## 2017-11-13 NOTE — Progress Notes (Signed)
This patient returns to the office for an evaluation of her left foot.  She has been diagnosed with metatarsal fractures of the third and fourth metatarsal left foot.  She has been wearing a surgical shoe with a compressions soak for 4 weeks and says her foot is going much better.   She presents the office today for continued evaluation and treatment of her fractured left foot.  General Appearance  Alert, conversant and in no acute stress.  Vascular  Dorsalis pedis and posterior tibial  pulses are palpable  bilaterally.  Capillary return is within normal limits  bilaterally. Temperature is within normal limits  bilaterally.  Neurologic  Senn-Weinstein monofilament wire test within normal limits  bilaterally. Muscle power within normal limits bilaterally.  Nails Normal nails noted with no evidence of bacterial or fungal infection  Orthopedic  No limitations of motion of motion feet .  No crepitus or effusions noted.  No bony pathology or digital deformities noted. Patient has generalized swelling over the second, third and fourth metatarsals of the left foot.  Mild redness noted at the sites.  She has increased temperature over the left forefoot than the right forefoot  Skin  normotropic skin with no porokeratosis noted bilaterally.  No signs of infections or ulcers noted.    Fracture, third and fourth metatarsals, left foot.  ROV.  X-rays taken do reveal healing noted at the fracture sites and good alignment of the metatarsal parabola following the fractures.  There is calcification noted at the insertion of the plantar fascia of the left foot.  Discussed this condition with this patient.  Told this patient. She should remain in her surgical shoe but if she desires. She may return to her regular footgear and let pain be her guide.  RTC 4 weeks   Gardiner Barefoot DPM

## 2017-12-08 ENCOUNTER — Ambulatory Visit (INDEPENDENT_AMBULATORY_CARE_PROVIDER_SITE_OTHER): Payer: Medicare Other | Admitting: Family Medicine

## 2017-12-08 ENCOUNTER — Ambulatory Visit (INDEPENDENT_AMBULATORY_CARE_PROVIDER_SITE_OTHER)
Admission: RE | Admit: 2017-12-08 | Discharge: 2017-12-08 | Disposition: A | Discharge status: Home / Self Care | Payer: Medicare Other | Source: Ambulatory Visit | Attending: Family Medicine | Admitting: Family Medicine

## 2017-12-08 ENCOUNTER — Encounter: Payer: Self-pay | Admitting: Family Medicine

## 2017-12-08 VITALS — BP 130/60 | HR 73 | Temp 98.3°F | Ht 65.5 in

## 2017-12-08 DIAGNOSIS — M25561 Pain in right knee: Secondary | ICD-10-CM

## 2017-12-08 MED ORDER — TRAMADOL HCL 50 MG PO TABS: 50.0000 mg | ORAL_TABLET | Freq: Three times a day (TID) | ORAL | 0 refills | Status: DC | PRN

## 2017-12-08 MED ORDER — DICLOFENAC SODIUM 1 % TD GEL: 4.0000 g | Freq: Four times a day (QID) | TRANSDERMAL | 1 refills | Status: DC

## 2017-12-08 NOTE — Progress Notes (Signed)
Subjective:    Patient ID: Patty Shelton, female    DOB: March 25, 1941, 77 y.o.   MRN: [id number]  HPI    77 year old female presents with right leg pain.   She reports she had 2 fractured toes in left foot.. Was wearing boot x 2 months.  She had abnormal gait while wearing boot.  When boot came off 6/3.Marland Kitchen Started with right knee pain and swelling in last week. She had noted catching in right knee x 1 month, no popping. Last night she felt sharp tearing pain in posterior right knee when gping up stairs last night.. Now she cannot put pressure or weight on right leg.  Currently with severe pain in right posterior knee. Pain with bending and straightening right knee.  She has tried tylenol, last night iced posterior knee.   Social History /Family History/Past Medical History reviewed in detail and updated in EMR if needed. Patient Active Problem List   Diagnosis Date Noted  . Acute bronchitis 10/06/2017  . Rib pain on left side 09/08/2017  . Accidental fall 09/08/2017  . Acute foot pain, left 09/08/2017  . Dysphagia 03/28/2017  . GERD (gastroesophageal reflux disease) 03/28/2017  . MRSA (methicillin resistant staph aureus) culture positive 03/28/2017  . Eustachian tube dysfunction, right 08/04/2016  . Mixed incontinence 06/24/2016  . Viral URI with cough 11/25/2015  . CKD (chronic kidney disease) stage 3, GFR 30-59 ml/min (HCC) 05/22/2015  . Advance directive discussed with patient 05/22/2015  . Hearing loss 03/01/2013  . Herpes genitalia 04/28/2011  . Osteopenia 03/22/2011  . Hypertension 02/11/2011  . HYPERCHOLESTEROLEMIA 03/27/2009  . Allergic rhinitis 03/27/2009    Blood pressure 130/60, pulse 73, temperature 98.3 F (36.8 C), temperature source Oral, height 5' 5.5" (1.664 m).  Review of Systems  Constitutional: Negative for fatigue and fever.  HENT: Negative for congestion.   Eyes: Negative for pain.  Respiratory: Negative for cough and shortness of breath.     Cardiovascular: Negative for chest pain, palpitations and leg swelling.  Gastrointestinal: Negative for abdominal pain.  Genitourinary: Negative for dysuria and vaginal bleeding.  Musculoskeletal: Negative for back pain.  Neurological: Negative for syncope, light-headedness and headaches.  Psychiatric/Behavioral: Negative for dysphoric mood.       Objective:   Physical Exam  Constitutional: Vital signs are normal. She appears well-developed and well-nourished. She is cooperative.  Non-toxic appearance. She does not appear ill. No distress.  HENT:  Head: Normocephalic.  Right Ear: Hearing, tympanic membrane, external ear and ear canal normal. Tympanic membrane is not erythematous, not retracted and not bulging.  Left Ear: Hearing, tympanic membrane, external ear and ear canal normal. Tympanic membrane is not erythematous, not retracted and not bulging.  Nose: No mucosal edema or rhinorrhea. Right sinus exhibits no maxillary sinus tenderness and no frontal sinus tenderness. Left sinus exhibits no maxillary sinus tenderness and no frontal sinus tenderness.  Mouth/Throat: Uvula is midline, oropharynx is clear and moist and mucous membranes are normal.  Eyes: Pupils are equal, round, and reactive to light. Conjunctivae, EOM and lids are normal. Lids are everted and swept, no foreign bodies found.  Neck: Trachea normal and normal range of motion. Neck supple. Carotid bruit is not present. No thyroid mass and no thyromegaly present.  Cardiovascular: Normal rate, regular rhythm, S1 normal, S2 normal, normal heart sounds, intact distal pulses and normal pulses. Exam reveals no gallop and no friction rub.  No murmur heard. Pulmonary/Chest: Effort normal and breath sounds normal. No tachypnea. No  respiratory distress. She has no decreased breath sounds. She has no wheezes. She has no rhonchi. She has no rales.  Abdominal: Soft. Normal appearance and bowel sounds are normal. There is no tenderness.   Musculoskeletal:       Right knee: She exhibits decreased range of motion, swelling and abnormal meniscus. She exhibits no effusion, normal patellar mobility, no bony tenderness and no MCL laxity. Tenderness found. Medial joint line tenderness noted. No MCL, no LCL and no patellar tendon tenderness noted.  posterior joint line pain  mildly postive MCmurray's  Neurological: She is alert.  Skin: Skin is warm, dry and intact. No rash noted.  Psychiatric: Her speech is normal and behavior is normal. Judgment and thought content normal. Her mood appears not anxious. Cognition and memory are normal. She does not exhibit a depressed mood.          Assessment & Plan:

## 2017-12-08 NOTE — Patient Instructions (Addendum)
Rest the knee. Avoid positions and activities that place excessive pressure on the knee joint until pain and swelling resolve. Such activities include: squatting, kneeling, twisting and pivoting, repetitive bending (eg, stairs, getting out of a seated position, clutch and pedal pushing), jogging, dancing, and swimming using the frog or whip kick. Apply ice to the knee for 15 minutes every four to six hours, while keeping the leg elevated. Encourage the use of crutches if the pain is severe. A patellar restraining brace may be helpful if quadriceps strength is poor and the knee frequently "gives out."  Use diclofenac gel for pain and inflammation. Can use tramadol for breakthrough pain.   Meniscus Tear, Phase I Rehab Ask your health care provider which exercises are safe for you. Do exercises exactly as told by your health care provider and adjust them as directed. It is normal to feel mild stretching, pulling, tightness, or discomfort as you do these exercises, but you should stop right away if you feel sudden pain or your pain gets worse.Do not begin these exercises until told by your health care provider. Stretching and range of motion exercises These exercises warm up your muscles and joints and improve the movement and flexibility of your knee. These exercises also help to relieve pain and stiffness. Exercise A: Knee flexion, active  1. Lie on your back with both knees straight. If this causes back discomfort, bend your uninjured knee so your foot is flat on the floor. 2. Slowly slide your left / right heel back toward your buttocks until you feel a gentle stretch in the front of your knee or thigh. Stop if you have pain. 3. Hold for __________ seconds. 4. Slowly slide your left / right heel back to the starting position. Repeat __________ times. Complete this exercise __________ times a day. Exercise B: Knee extension, sitting  1. Sit with your left / right heel propped on a chair, a  coffee table, or a footstool. Do not have anything under your knee to support it. 2. Allow your leg muscles to relax, letting gravity straighten out your knee. You should feel a stretch behind your left / right knee. 3. If told by your health care provider, deepen the stretch by placing a __________ weight on your thigh, just above your kneecap. 4. Hold this position for __________ seconds. Repeat __________ times. Complete this stretch __________ times a day. Strengthening exercises These exercises build strength and endurance in your knee. Endurance is the ability to use your muscles for a long time, even after they get tired. Exercise C: Quadriceps, isometric  1. Lie on your back with your left / right leg extended and your other knee bent. Put a rolled towel or small pillow under your right/left knee if told by your health care provider. 2. Slowly tense the muscles in the front of your left / right thigh by pushing the back of your knee down. You should see your knee cap slide up toward your hip or see increased dimpling just above the knee. 3. For __________ seconds, keep the muscle as tight as you can without increasing your pain. 4. Relax the muscles slowly and completely. Repeat __________ times. Complete this exercise __________ times a day. Exercise D: Straight leg raises ( quadriceps) 1. Lie on your back with your left / right leg extended and your other knee bent. 2. Tense the muscles in the front of your left / right thigh. You should see your kneecap slide up or see increased dimpling  just above the knee. 3. Keep these muscles tight as you raise your leg 4-6 inches (10-15 cm) off the floor. 4. Hold this position for __________ seconds. 5. Keep these muscles tense as you lower your leg. 6. Relax the muscles slowly and completely. Repeat __________ times. Complete this exercise __________ times a day. Exercise E: Hamstring curls  1. On the floor or a bed, lie on your abdomen with  your legs straight. Put a folded towel or small pillow under your left / right thigh, just above your kneecap. 2. Slowly bend your left / right knee as far as you can without pain. Keep your hips flat against the floor or bed. 3. Hold this position for __________ seconds. 4. Slowly lower your leg to the starting position. Repeat __________ times. Complete this exercise __________ times a day. This information is not intended to replace advice given to you by your health care provider. Make sure you discuss any questions you have with your health care provider. Document Released: 06/13/1998 Document Revised: 02/02/2016 Document Reviewed: 04/05/2015 Elsevier Interactive Patient Education  2018 [employer].

## 2017-12-08 NOTE — Assessment & Plan Note (Signed)
Possible meniscal tear.  X-ray, ice. Brace, ROM exercises.  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  Follow up in 2 weeks.. May need futhur imaging or referral.

## 2017-12-11 ENCOUNTER — Ambulatory Visit: Payer: Medicare Other | Admitting: Podiatry

## 2017-12-22 ENCOUNTER — Ambulatory Visit: Payer: Medicare Other | Admitting: Family Medicine

## 2018-02-28 DIAGNOSIS — M25561 Pain in right knee: Secondary | ICD-10-CM | POA: Diagnosis not present

## 2018-02-28 DIAGNOSIS — M1711 Unilateral primary osteoarthritis, right knee: Secondary | ICD-10-CM | POA: Diagnosis not present

## 2018-03-28 ENCOUNTER — Ambulatory Visit (INDEPENDENT_AMBULATORY_CARE_PROVIDER_SITE_OTHER): Payer: Medicare Other | Admitting: Internal Medicine

## 2018-03-28 ENCOUNTER — Encounter: Payer: Self-pay | Admitting: Internal Medicine

## 2018-03-28 VITALS — BP 128/84 | HR 74 | Temp 98.1°F | Wt 179.0 lb

## 2018-03-28 DIAGNOSIS — R0981 Nasal congestion: Secondary | ICD-10-CM

## 2018-03-28 DIAGNOSIS — H9202 Otalgia, left ear: Secondary | ICD-10-CM | POA: Diagnosis not present

## 2018-03-28 NOTE — Progress Notes (Signed)
Subjective:    Patient ID: Patty Shelton, female    DOB: 08-Dec-1940, 77 y.o.   MRN: [id number]  HPI  Pt presents to the clinic today with c/o left ear pain. She reports this started 10 days ago. She reports some nasal congestion and some soreness behind the left ear. She denies ringing in the ear, drainage or decreased hearing. She denies runny nose, sore throat or cough. She denies fever, chills or body aches. She has tried Zyrtec, Flonase, Sweet Oil and Mucinex with minimal relief. She has a history of allergies. She has not had sick contacts.  Review of Systems      Past Medical History:  Diagnosis Date  . Hyperlipemia   . Hypertension     Current Outpatient Medications  Medication Sig Dispense Refill  . acetaminophen (TYLENOL) 500 MG tablet Take 500 mg by mouth every 6 (six) hours as needed.    Marland Kitchen albuterol (PROVENTIL HFA;VENTOLIN HFA) 108 (90 Base) MCG/ACT inhaler Inhale 2 puffs into the lungs every 6 (six) hours as needed for wheezing or shortness of breath. 1 Inhaler 2  . ALPRAZolam (XANAX) 0.25 MG tablet Take 1 tablet (0.25 mg total) by mouth daily as needed for anxiety. 20 tablet 0  . Biotin 10 MG TABS Take 1 tablet by mouth daily.    . Calcium Carbonate-Vitamin D (CALCIUM 600+D) 600-400 MG-UNIT per tablet Take 1 tablet by mouth daily.    . cetirizine (ZYRTEC) 10 MG tablet Take 10 mg by mouth daily.     Marland Kitchen CRANBERRY PO Take 1 capsule by mouth daily.    . diclofenac sodium (VOLTAREN) 1 % GEL Apply 4 g topically 4 (four) times daily. 100 g 1  . famciclovir (FAMVIR) 250 MG tablet Take 1 tablet (250 mg total) by mouth daily. 90 tablet 3  . fluticasone (FLONASE) 50 MCG/ACT nasal spray Place 2 sprays into both nostrils daily.    Marland Kitchen lisinopril-hydrochlorothiazide (PRINZIDE,ZESTORETIC) 10-12.5 MG tablet Take 1 tablet by mouth daily. 90 tablet 3  . mupirocin nasal ointment (BACTROBAN) 2 % Use small amount in each nostril twice daily for five (5) days. After application, press sides  of nose together and gently massage. (Patient taking differently: as needed. Use small amount in each nostril twice daily for five (5) days. After application, press sides of nose together and gently massage.) 10 g 0  . pravastatin (PRAVACHOL) 40 MG tablet Take 1 tablet (40 mg total) by mouth daily. 90 tablet 3  . traMADol (ULTRAM) 50 MG tablet Take 1 tablet (50 mg total) by mouth every 8 (eight) hours as needed. 15 tablet 0   No current facility-administered medications for this visit.     Allergies  Allergen Reactions  . Lipitor [Atorvastatin] Other (See Comments)    Aches at 40mg  dose Leg cramps     Family History  Problem Relation Age of Onset  . Heart attack Mother   . Stroke Father   . Hyperlipidemia Father   . Dementia Father     Social History   Socioeconomic History  . Marital status: Married    Spouse name: Not on file  . Number of children: Not on file  . Years of education: Not on file  . Highest education level: Not on file  Occupational History  . Not on file  Social Needs  . Financial resource strain: Not on file  . Food insecurity:    Worry: Not on file    Inability: Not on file  .  Transportation needs:    Medical: Not on file    Non-medical: Not on file  Tobacco Use  . Smoking status: Never Smoker  . Smokeless tobacco: Never Used  Substance and Sexual Activity  . Alcohol use: No    Alcohol/week: 0.0 standard drinks  . Drug use: No  . Sexual activity: Yes  Lifestyle  . Physical activity:    Days per week: Not on file    Minutes per session: Not on file  . Stress: Not on file  Relationships  . Social connections:    Talks on phone: Not on file    Gets together: Not on file    Attends religious service: Not on file    Active member of club or organization: Not on file    Attends meetings of clubs or organizations: Not on file    Relationship status: Not on file  . Intimate partner violence:    Fear of current or ex partner: Not on file     Emotionally abused: Not on file    Physically abused: Not on file    Forced sexual activity: Not on file  Other Topics Concern  . Not on file  Social History Narrative   No living will , no HCPOA. Full code ( reviewed 2015, given packet)     Constitutional: Denies fever, malaise, fatigue, headache or abrupt weight changes.  HEENT: Pt reports nasal congestion, ear pain. Denies eye pain, eye redness, ringing in the ears, wax buildup, runny nose, bloody nose, or sore throat. Respiratory: Denies difficulty breathing, shortness of breath, cough or sputum production.    No other specific complaints in a complete review of systems (except as listed in HPI above).  Objective:   Physical Exam   BP 128/84   Pulse 74   Temp 98.1 F (36.7 C) (Oral)   Wt 179 lb (81.2 kg)   SpO2 97%   BMI 29.33 kg/m  Wt Readings from Last 3 Encounters:  03/28/18 179 lb (81.2 kg)  10/06/17 179 lb 4 oz (81.3 kg)  09/28/17 178 lb 4 oz (80.9 kg)    General: Appears her stated age, well developed, well nourished in NAD. HEENT: Head: normal shape and size, no sinus tenderness  noted; Eyes: sclera white, no icterus, conjunctiva pink; Ears: Tm's gray and intact, normal light reflex; Nose: mucosa boggy and moist, turbinates swollen; Throat/Mouth: Teeth present, mucosa pink and moist, no exudate, lesions or ulcerations noted.  Neck:  No adenopathy noted.  Cardiovascular: Normal rate and rhythm.  Pulmonary/Chest: Normal effort and positive vesicular breath sounds. No respiratory distress. No wheezes, rales or ronchi noted.   BMET    Component Value Date/Time   NA 139 07/18/2017 1055   K 4.1 07/18/2017 1055   CL 101 07/18/2017 1055   CO2 27 07/18/2017 1055   GLUCOSE 97 07/18/2017 1055   BUN 24 (H) 07/18/2017 1055   CREATININE 1.12 07/18/2017 1055   CALCIUM 9.6 07/18/2017 1055   GFRNONAA 54 (L) 11/27/2016 0846   GFRAA >60 11/27/2016 0846    Lipid Panel     Component Value Date/Time   CHOL 201 (H)  07/18/2017 1055   TRIG 121.0 07/18/2017 1055   TRIG 123 03/19/2009 0926   HDL 41.60 07/18/2017 1055   CHOLHDL 5 07/18/2017 1055   VLDL 24.2 07/18/2017 1055   LDLCALC 136 (H) 07/18/2017 1055    CBC    Component Value Date/Time   WBC 17.8 (H) 11/27/2016 0846   RBC 4.48 11/27/2016  0846   HGB 13.6 11/27/2016 0846   HCT 39.7 11/27/2016 0846   PLT 300 11/27/2016 0846   MCV 88.7 11/27/2016 0846   MCH 30.3 11/27/2016 0846   MCHC 34.2 11/27/2016 0846   RDW 12.9 11/27/2016 0846   LYMPHSABS 2.6 04/03/2014 1705   MONOABS 0.6 04/03/2014 1705   EOSABS 0.3 04/03/2014 1705   BASOSABS 0.0 04/03/2014 1705    Hgb A1C No results found for: HGBA1C         Assessment & Plan:   Nasal Congestion, Otalgia, Left Ear:  Exam benign Advised her to continue Zyrtec Start Flonase BID x 3 days then daily thereafter if needed Let me know if no improvement before the weekend Can take Ibuprofen 600 mg every 8 hours as needed with food for pain  Return precautions discussed

## 2018-03-28 NOTE — Patient Instructions (Signed)

## 2018-04-02 ENCOUNTER — Other Ambulatory Visit: Payer: Self-pay | Admitting: Orthopedic Surgery

## 2018-04-02 DIAGNOSIS — M25561 Pain in right knee: Secondary | ICD-10-CM

## 2018-04-02 DIAGNOSIS — L820 Inflamed seborrheic keratosis: Secondary | ICD-10-CM | POA: Diagnosis not present

## 2018-04-02 DIAGNOSIS — L821 Other seborrheic keratosis: Secondary | ICD-10-CM | POA: Diagnosis not present

## 2018-04-10 ENCOUNTER — Other Ambulatory Visit: Payer: Self-pay | Admitting: Family Medicine

## 2018-04-10 DIAGNOSIS — Z1231 Encounter for screening mammogram for malignant neoplasm of breast: Secondary | ICD-10-CM

## 2018-04-13 ENCOUNTER — Ambulatory Visit (INDEPENDENT_AMBULATORY_CARE_PROVIDER_SITE_OTHER): Payer: Medicare Other | Admitting: Family Medicine

## 2018-04-13 ENCOUNTER — Encounter: Payer: Self-pay | Admitting: Family Medicine

## 2018-04-13 DIAGNOSIS — S161XXA Strain of muscle, fascia and tendon at neck level, initial encounter: Secondary | ICD-10-CM

## 2018-04-13 MED ORDER — DICLOFENAC SODIUM 1 % TD GEL: 4.0000 g | Freq: Four times a day (QID) | TRANSDERMAL | 1 refills | Status: DC

## 2018-04-13 NOTE — Progress Notes (Signed)
   Subjective:    Patient ID: Patty Shelton, female    DOB: 22-Jun-1940, 77 y.o.   MRN: [id number]  Otalgia   There is pain in the left ear. The current episode started more than 1 month ago. The problem has been waxing and waning. There has been no fever. The pain is at a severity of 5/10. Associated symptoms include coughing. Pertinent negatives include no abdominal pain, diarrhea, ear discharge, headaches, hearing loss, neck pain, rash, rhinorrhea or sore throat. Associated symptoms comments: occ dry cough. Treatments tried: zyrtec, flonase, mucinex. There is no history of a chronic ear infection, hearing loss or a tympanostomy tube.    Told ETD  In 03/2018 using flonase off and on.  Review of Systems  HENT: Positive for ear pain. Negative for ear discharge, hearing loss, rhinorrhea and sore throat.   Respiratory: Positive for cough.   Gastrointestinal: Negative for abdominal pain and diarrhea.  Musculoskeletal: Negative for neck pain.  Skin: Negative for rash.  Neurological: Negative for headaches.       Objective:   Physical Exam  Constitutional: Vital signs are normal. She appears well-developed and well-nourished. She is cooperative.  Non-toxic appearance. She does not appear ill. No distress.  HENT:  Head: Normocephalic.  Right Ear: Hearing, tympanic membrane, external ear and ear canal normal. Tympanic membrane is not erythematous, not retracted and not bulging. No middle ear effusion.  Left Ear: Hearing, tympanic membrane, external ear and ear canal normal. Tympanic membrane is not erythematous, not retracted and not bulging.  No middle ear effusion.  Nose: No mucosal edema or rhinorrhea. Right sinus exhibits no maxillary sinus tenderness and no frontal sinus tenderness. Left sinus exhibits no maxillary sinus tenderness and no frontal sinus tenderness.  Mouth/Throat: Uvula is midline, oropharynx is clear and moist and mucous membranes are normal.  Eyes: Pupils are equal, round,  and reactive to light. Conjunctivae, EOM and lids are normal. Lids are everted and swept, no foreign bodies found.  Neck: Trachea normal. Neck supple. Muscular tenderness present. Carotid bruit is not present. Decreased range of motion present. No thyroid mass and no thyromegaly present.  Cardiovascular: Normal rate, regular rhythm, S1 normal, S2 normal, normal heart sounds, intact distal pulses and normal pulses. Exam reveals no gallop and no friction rub.  No murmur heard. Pulmonary/Chest: Effort normal and breath sounds normal. No tachypnea. No respiratory distress. She has no decreased breath sounds. She has no wheezes. She has no rhonchi. She has no rales.  Abdominal: Soft. Normal appearance and bowel sounds are normal. There is no tenderness.  Neurological: She is alert. She has normal strength. No cranial nerve deficit or sensory deficit.  Skin: Skin is warm, dry and intact. No rash noted.  Psychiatric: Her speech is normal and behavior is normal. Judgment and thought content normal. Her mood appears not anxious. Cognition and memory are normal. She does not exhibit a depressed mood.          Assessment & Plan:

## 2018-04-13 NOTE — Patient Instructions (Addendum)
Start heat , massage on neck.  Can apply diclofenac gel on neck.  Start upper neck stretches per info.

## 2018-04-19 ENCOUNTER — Ambulatory Visit: Payer: Medicare Other

## 2018-05-01 ENCOUNTER — Ambulatory Visit
Admission: RE | Admit: 2018-05-01 | Discharge: 2018-05-01 | Disposition: A | Discharge status: Home / Self Care | Payer: Medicare Other | Source: Ambulatory Visit | Attending: Family Medicine | Admitting: Family Medicine

## 2018-05-01 DIAGNOSIS — Z1231 Encounter for screening mammogram for malignant neoplasm of breast: Secondary | ICD-10-CM | POA: Insufficient documentation

## 2018-05-16 DIAGNOSIS — S161XXA Strain of muscle, fascia and tendon at neck level, initial encounter: Secondary | ICD-10-CM | POA: Insufficient documentation

## 2018-05-16 NOTE — Assessment & Plan Note (Signed)
Heat, home stretches.  No clear ear pathology at this time... ? Referred from neck. Can apply diclofenac gel on neck.

## 2018-06-12 IMAGING — CR DG CHEST 2V
2 series · 2 of 2 positions shown · non-contrast
Comparison: None.

CLINICAL DATA: 75-year-old female with a history of productive
cough, shortness of breath

EXAM:
CHEST  2 VIEW

[chest pa]
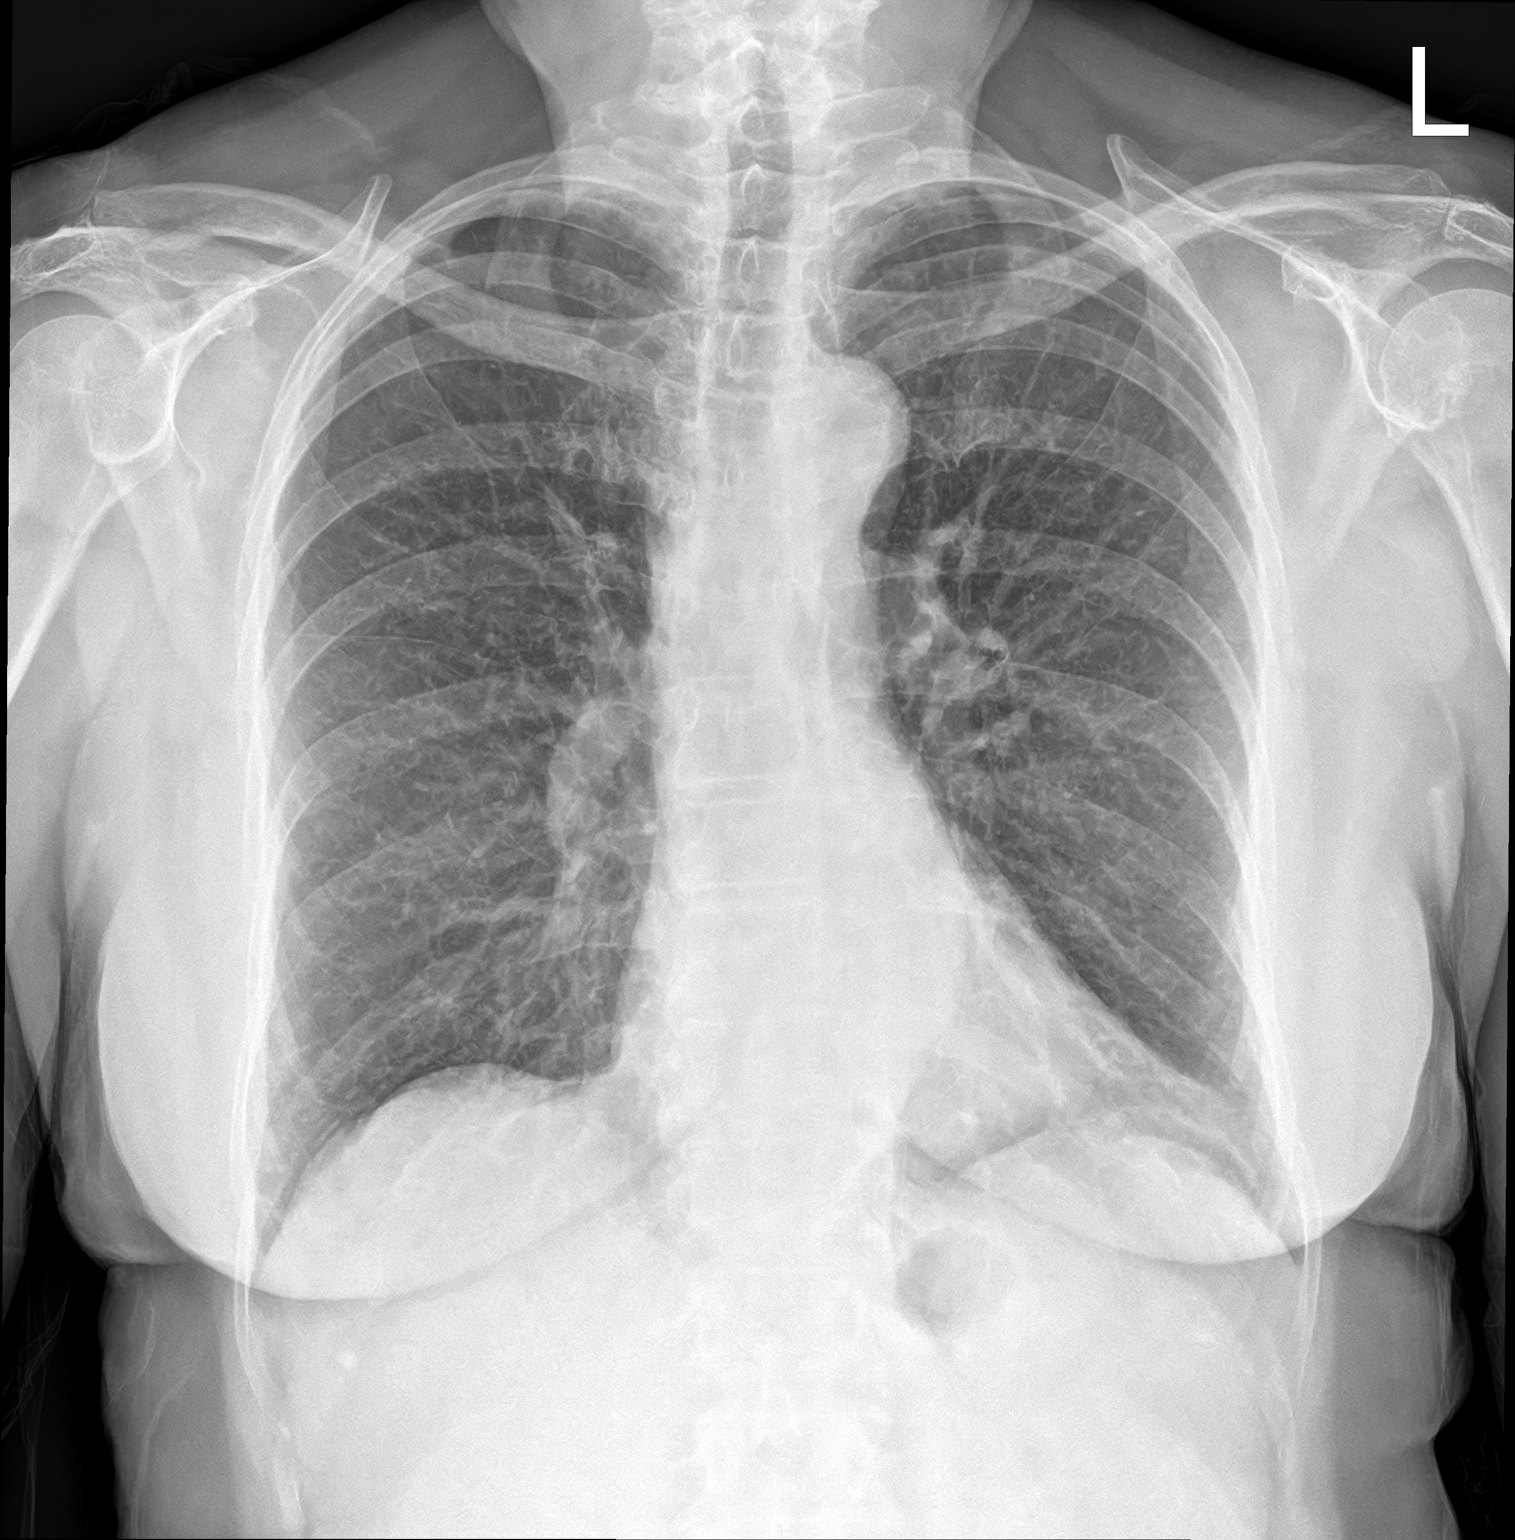

[chest lat]
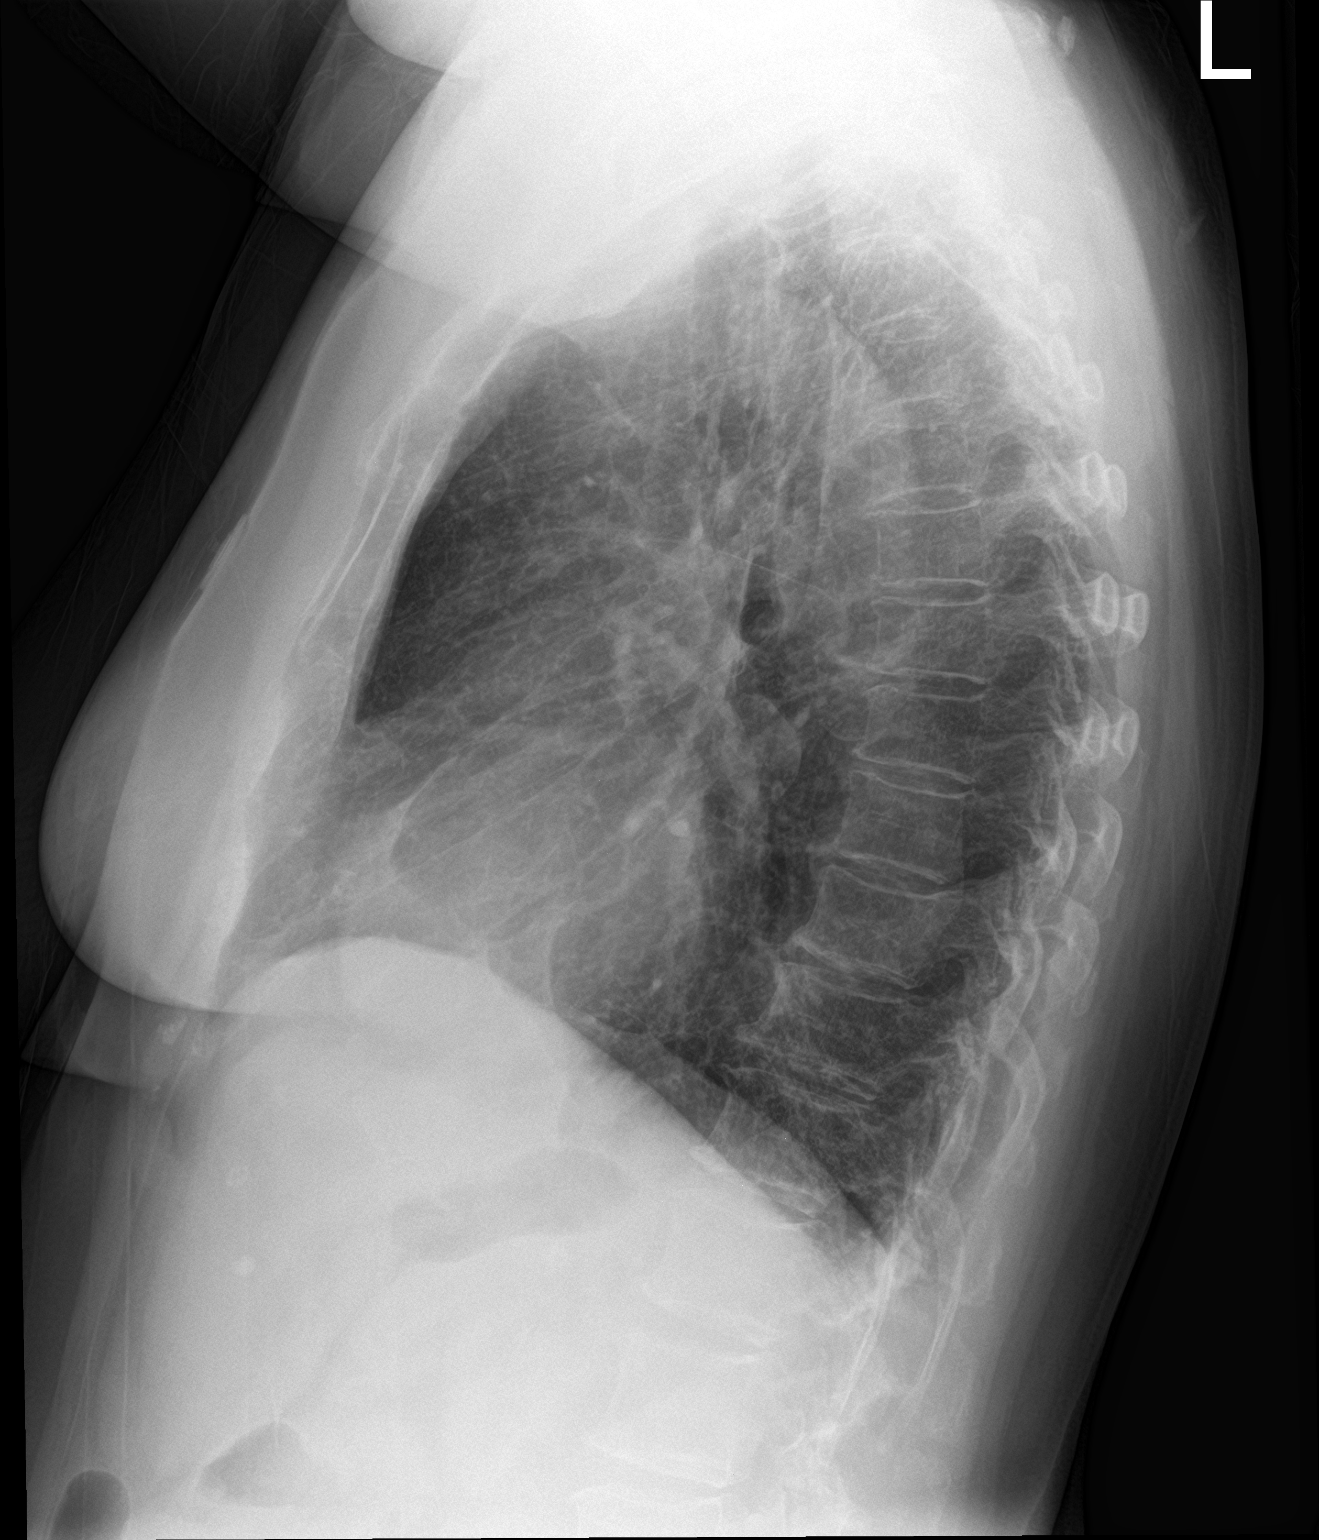

[2 of 2 positions shown; findings below may reference images not displayed]

FINDINGS: Cardiomediastinal silhouette within normal limits. Calcifications of
the aortic arch.

No pneumothorax.  No large pleural effusion.

Early changes of x-ray findings associated with emphysema, with
increasing retrosternal airspace, flattening of the hemidiaphragms,
increased AP diameter, and mild hyperinflation on the AP view.

Reticular pattern opacity throughout the bilateral lungs. No
confluent airspace disease. No interlobular septal thickening.
IMPRESSION: No evidence of lobar pneumonia.

Reticular pattern opacity throughout the lungs with no comparison
available. Given the overall appearance, these changes are favored
to reflect developing emphysema/ scarring, however, atypical
infection or pneumonitis difficult to exclude and correlation with
lab values may be useful.

## 2018-06-12 IMAGING — CT CT ANGIO CHEST
2 of 12 series · 17 of 38 positions shown · IV contrast (APPLIED)
Comparison: Chest x-ray from this morning

CLINICAL DATA: Productive cough for 1 month with sudden onset of
shortness of breath this morning.

EXAM:
CT ANGIOGRAPHY CHEST WITH CONTRAST
TECHNIQUE: Multidetector CT imaging of the chest was performed using the
standard protocol during bolus administration of intravenous
contrast. Multiplanar CT image reconstructions and MIPs were
obtained to evaluate the vascular anatomy.
CONTRAST:  75 mL of Isovue 370

[Series 5: thins · axial · 0.55mm/px · z∈[-649,-428]mm · 14 of 255 slices shown (1 of 2)]
[im 17/255  lung]
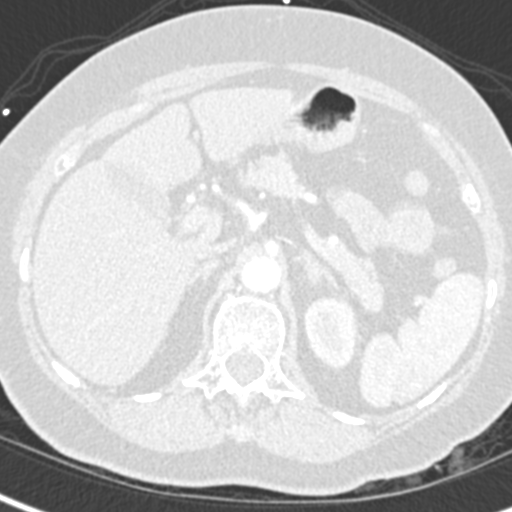
[im 34/255  mediastinal]
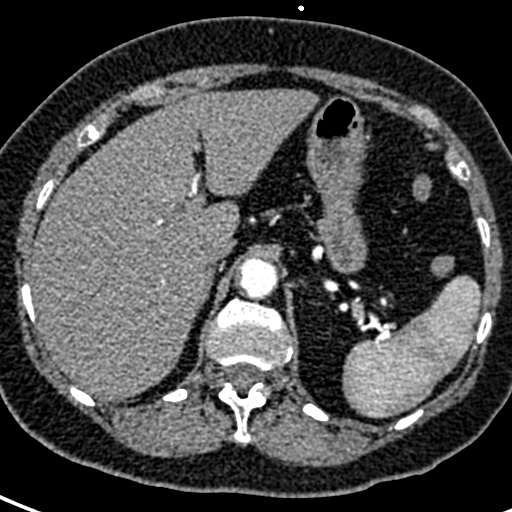
[im 51/255  lung]
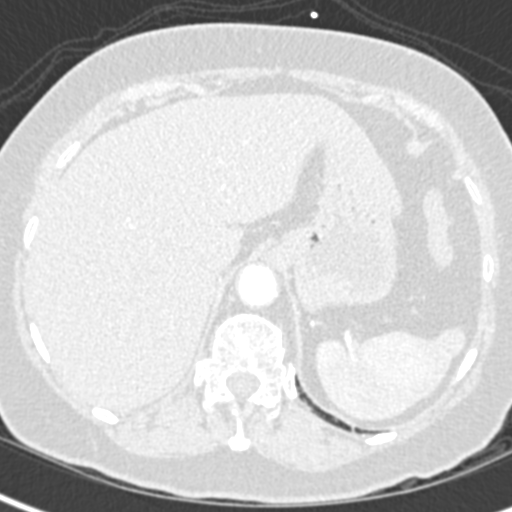
[im 68/255  mediastinal]
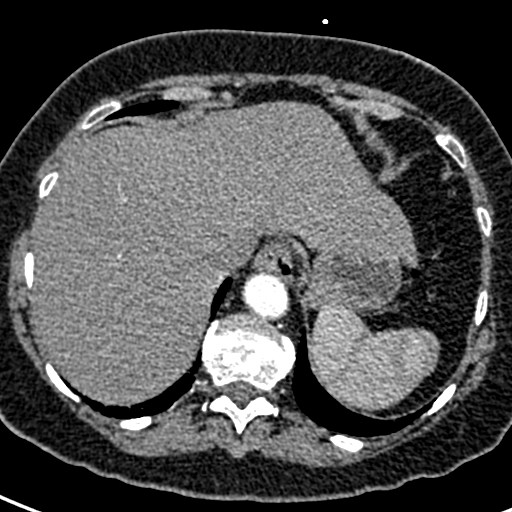
[im 85/255  lung]
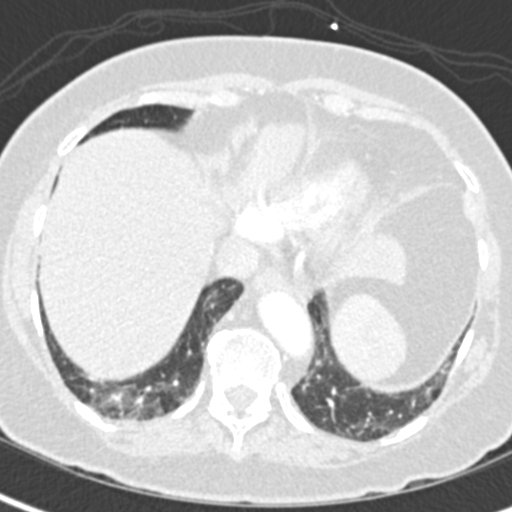
[im 102/255  mediastinal]
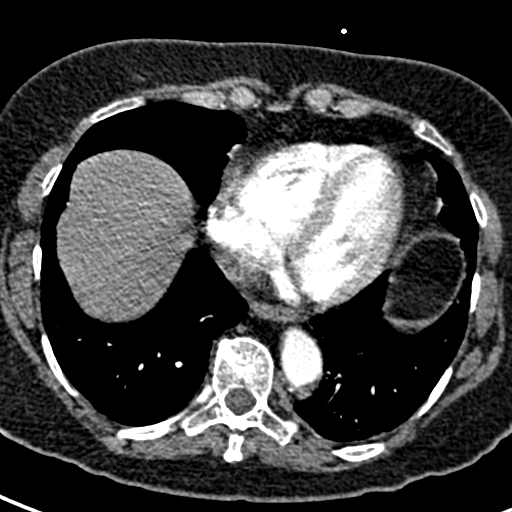
[im 119/255  lung]
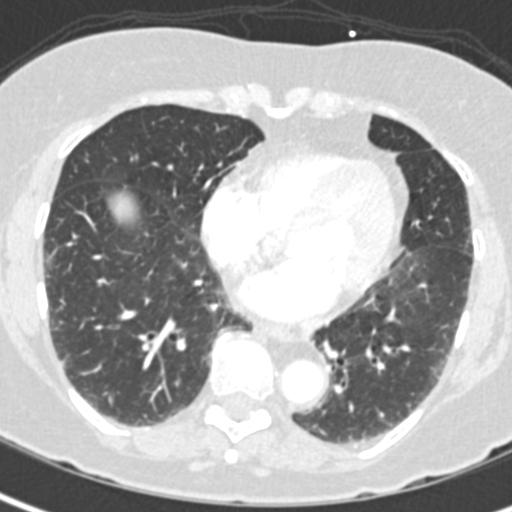
[im 136/255  mediastinal]
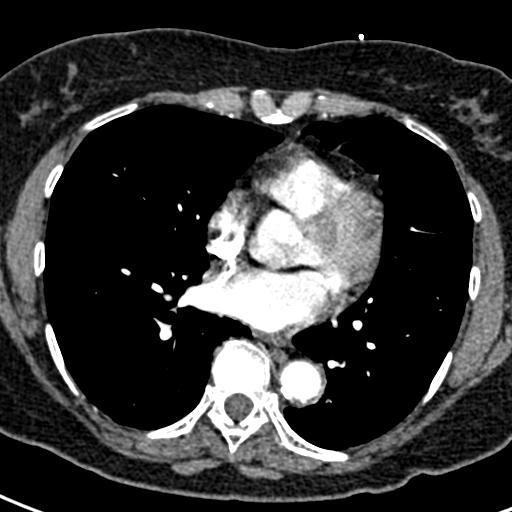
[im 153/255  lung]
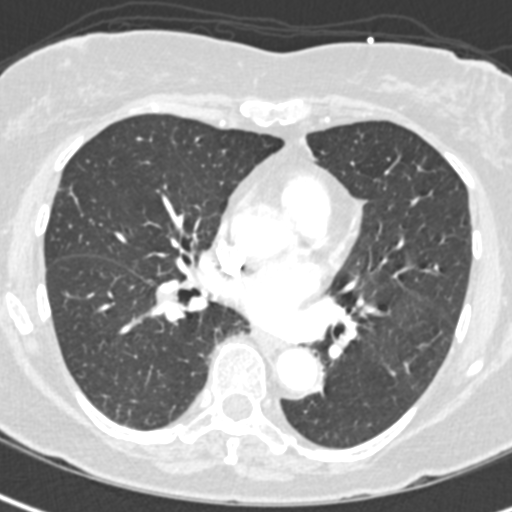
[im 170/255  mediastinal]
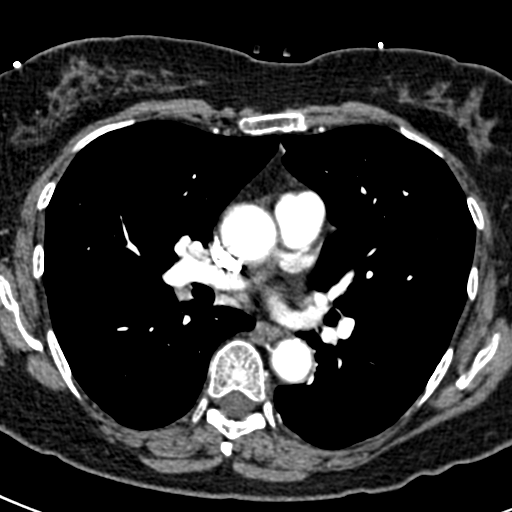
[im 187/255  lung]
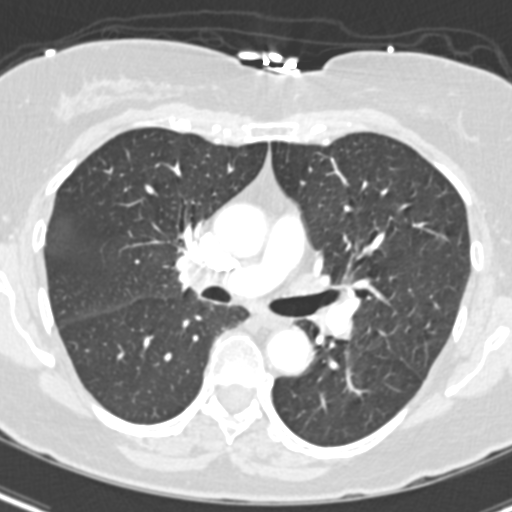
[im 204/255  mediastinal]
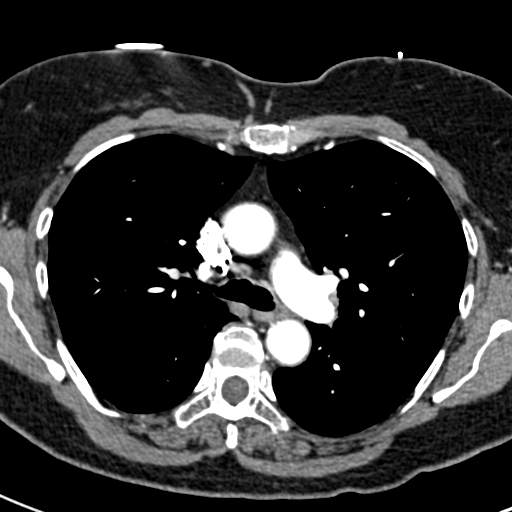
[im 221/255  lung]
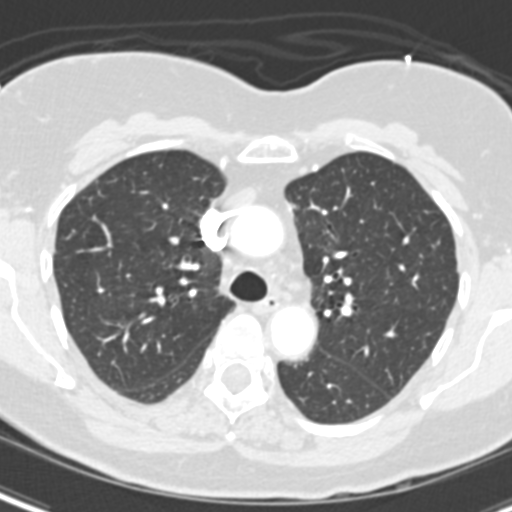
[im 238/255  mediastinal]
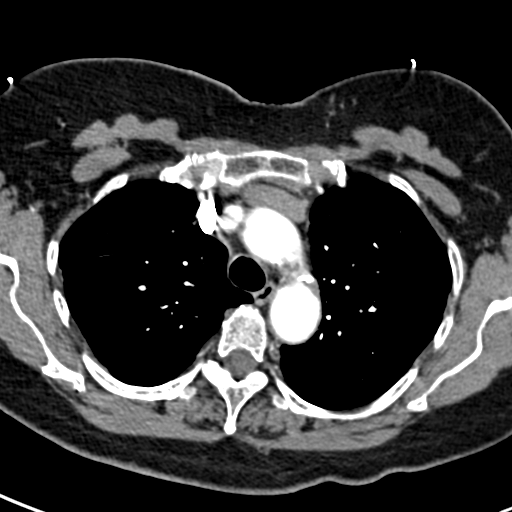

[Series 14: thins · axial · 0.55mm/px · z∈[-383,-345]mm · 3 of 78 slices shown (2 of 2)]
[im 20/78  lung]
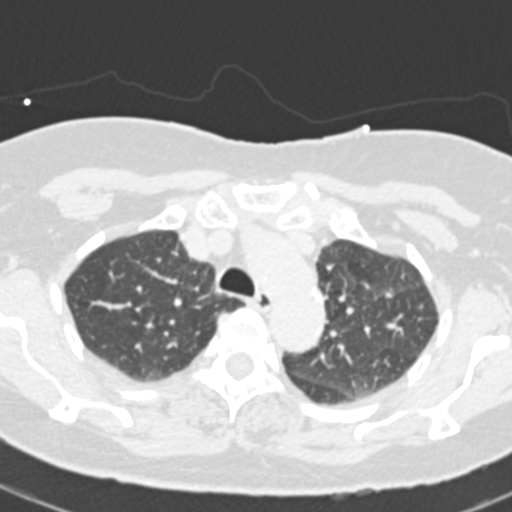
[im 39/78  lung]
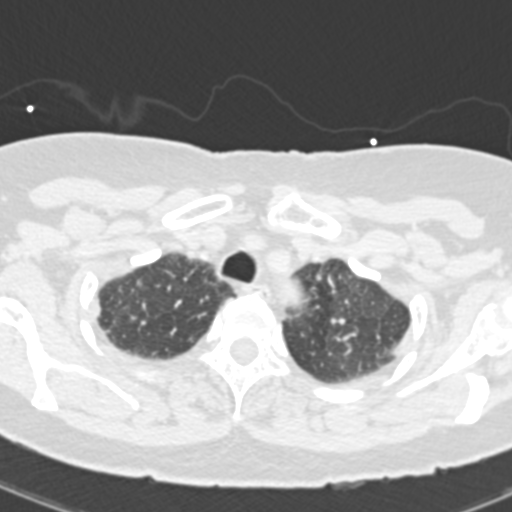
[im 58/78  lung]
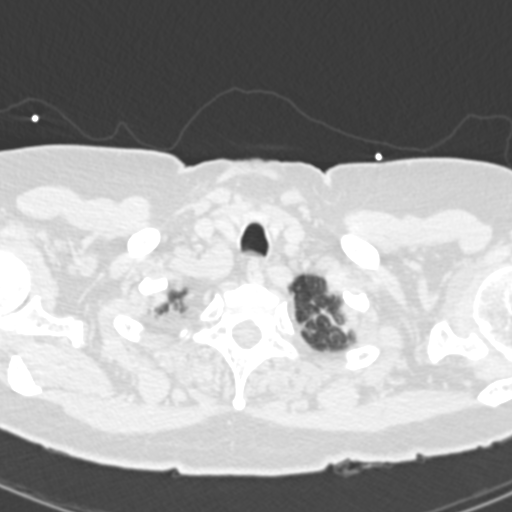

[17 of 38 positions shown; findings below may reference images not displayed]

FINDINGS: Cardiovascular: The heart size is normal. No coronary artery
calcifications are seen. The top of the aortic arch was not well
assessed as the apices had to be scanned separately due to a scanner
malfunction. The [DATE] of images which imaged the top of the
aortic arch demonstrated poor opacification of the vessels. Within
this limitation, the thoracic aorta is non aneurysmal with no
dissection. Mild atherosclerotic changes are seen. No pulmonary
emboli.

Mediastinum/Nodes: The esophagus and thyroid are normal. No
adenopathy. No effusions.

Lungs/Pleura: Central airways are normal. No pneumothorax.
Pleuroparenchymal scarring at the apices is mild. No suspicious
infiltrate identified. No evidence of pneumonia. Mild peripheral
reticular changes are seen in nondependent portions of the lungs. No
overt edema. Mild atelectasis in the bases. No suspicious nodules or
masses.

Upper Abdomen: A low-attenuation lesion in the left hepatic lobe on
series 4, image 60 is too small to characterize but probably a cyst.
Evaluation of the upper abdomen is otherwise unremarkable.

Musculoskeletal: No chest wall abnormality. No acute or significant
osseous findings.

Review of the MIP images confirms the above findings.
IMPRESSION: 1. No pulmonary emboli.
2. Mild peripheral reticular changes in the lungs consistent with
mild interstitial changes.
3. Atherosclerosis in the non aneurysmal thoracic aorta.

Aortic Atherosclerosis ([id number]).

## 2018-07-25 ENCOUNTER — Telehealth: Payer: Self-pay

## 2018-07-25 DIAGNOSIS — E7800 Pure hypercholesterolemia, unspecified: Secondary | ICD-10-CM

## 2018-07-25 DIAGNOSIS — I10 Essential (primary) hypertension: Secondary | ICD-10-CM

## 2018-07-25 DIAGNOSIS — E559 Vitamin D deficiency, unspecified: Secondary | ICD-10-CM

## 2018-07-25 NOTE — Telephone Encounter (Signed)
CPE labs ordered 

## 2018-07-26 ENCOUNTER — Ambulatory Visit (INDEPENDENT_AMBULATORY_CARE_PROVIDER_SITE_OTHER): Payer: Medicare Other

## 2018-07-26 ENCOUNTER — Ambulatory Visit: Payer: Medicare Other

## 2018-07-26 VITALS — BP 122/74 | HR 76 | Temp 98.0°F | Ht 65.5 in | Wt 174.0 lb

## 2018-07-26 DIAGNOSIS — Z0000 Encounter for general adult medical examination without abnormal findings: Secondary | ICD-10-CM | POA: Diagnosis not present

## 2018-07-26 DIAGNOSIS — E7800 Pure hypercholesterolemia, unspecified: Secondary | ICD-10-CM | POA: Diagnosis not present

## 2018-07-26 DIAGNOSIS — I10 Essential (primary) hypertension: Secondary | ICD-10-CM

## 2018-07-26 DIAGNOSIS — E559 Vitamin D deficiency, unspecified: Secondary | ICD-10-CM

## 2018-07-26 LAB — COMPREHENSIVE METABOLIC PANEL
ALBUMIN: 4.3 g/dL (ref 3.5–5.2)
ALK PHOS: 66 U/L (ref 39–117)
ALT: 16 U/L (ref 0–35)
AST: 17 U/L (ref 0–37)
BUN: 17 mg/dL (ref 6–23)
CALCIUM: 9.3 mg/dL (ref 8.4–10.5)
CO2: 28 mEq/L (ref 19–32)
CREATININE: 1.17 mg/dL (ref 0.40–1.20)
Chloride: 100 mEq/L (ref 96–112)
GFR: 44.72 mL/min — ABNORMAL LOW (ref >60.00)
Glucose, Bld: 92 mg/dL (ref 70–99)
POTASSIUM: 4.4 meq/L (ref 3.5–5.1)
SODIUM: 137 meq/L (ref 135–145)
TOTAL PROTEIN: 7.1 g/dL (ref 6.0–8.3)
Total Bilirubin: 0.3 mg/dL (ref 0.2–1.2)

## 2018-07-26 LAB — VITAMIN D 25 HYDROXY (VIT D DEFICIENCY, FRACTURES): VITD: 35.44 ng/mL (ref 30.00–100.00)

## 2018-07-26 LAB — LIPID PANEL
Cholesterol: 191 mg/dL (ref 0–200)
HDL: 50.1 mg/dL (ref >39.00)
LDL Cholesterol: 123 mg/dL — ABNORMAL HIGH (ref 0–99)
NonHDL: 140.77
TRIGLYCERIDES: 90 mg/dL (ref 0.0–149.0)
Total CHOL/HDL Ratio: 4
VLDL: 18 mg/dL (ref 0.0–40.0)

## 2018-07-26 NOTE — Progress Notes (Signed)
PCP notes:   Health maintenance:  No gaps identified.   Abnormal screenings:   Hearing - failed  Hearing Screening   125Hz  250Hz  500Hz  1000Hz  2000Hz  3000Hz  4000Hz  6000Hz  8000Hz   Right ear:   0 0 40  40    Left ear:   40 40 40  40     Fall risk - hx of single fall Fall Risk  07/26/2018 07/18/2017 05/19/2016 05/19/2016 05/22/2015  Falls in the past year? 1 No Yes No No  Comment fell; multiple fractures to left foot - [facility] Telephone Survey: data to providers prior to load - -  Number falls in past yr: 0 - 1 - -  Comment - - [facility] Telephone Survey Actual Response = 1 - -  Injury with Fall? 1 - No - -    Patient concerns:   Urinary and fecal incontinence - report of "mud looking" fecal matter  Nurse concerns:  None  Next PCP appt:   07/31/18 @ 1000

## 2018-07-26 NOTE — Progress Notes (Signed)
I reviewed health advisor's note, was available for consultation, and agree with documentation and plan.  

## 2018-07-26 NOTE — Patient Instructions (Signed)
Patty Shelton , Thank you for taking time to come for your Medicare Wellness Visit. I appreciate your ongoing commitment to your health goals. Please review the following plan we discussed and let me know if I can assist you in the future.   These are the goals we discussed: Goals    . Follow up with Primary Care Provider     Starting 07/26/2018, I will continue to take medications as prescribed and to keep appointments with PCP as scheduled.        This is a list of the screening recommended for you and due dates:  Health Maintenance  Topic Date Due  . Mammogram  05/02/2019  . Tetanus Vaccine  03/10/2021  . Flu Shot  Completed  . DEXA scan (bone density measurement)  Completed  . Pneumonia vaccines  Completed   Preventive Care for Adults  A healthy lifestyle and preventive care can promote health and wellness. Preventive health guidelines for adults include the following key practices.  . A routine yearly physical is a good way to check with your health care provider about your health and preventive screening. It is a chance to share any concerns and updates on your health and to receive a thorough exam.  . Visit your dentist for a routine exam and preventive care every 6 months. Brush your teeth twice a day and floss once a day. Good oral hygiene prevents tooth decay and gum disease.  . The frequency of eye exams is based on your age, health, family medical history, use  of contact lenses, and other factors. Follow your health care provider's recommendations for frequency of eye exams.  . Eat a healthy diet. Foods like vegetables, fruits, whole grains, low-fat dairy products, and lean protein foods contain the nutrients you need without too many calories. Decrease your intake of foods high in solid fats, added sugars, and salt. Eat the right amount of calories for you. Get information about a proper diet from your health care provider, if necessary.  . Regular physical exercise is one  of the most important things you can do for your health. Most adults should get at least 150 minutes of moderate-intensity exercise (any activity that increases your heart rate and causes you to sweat) each week. In addition, most adults need muscle-strengthening exercises on 2 or more days a week.  Silver Sneakers may be a benefit available to you. To determine eligibility, you may visit the website: www.silversneakers.com or contact program at 930 556 5883 Mon-Fri between 8AM-8PM.   . Maintain a healthy weight. The body mass index (BMI) is a screening tool to identify possible weight problems. It provides Shel[place] estimate of body fat based on height and weight. Your health care provider can find your BMI and can help you achieve or maintain a healthy weight.   For adults 20 years and older: ? A BMI below 18.5 is considered underweight. ? A BMI of 18.5 to 24.9 is normal. ? A BMI of 25 to 29.9 is considered overweight. ? A BMI of 30 and above is considered obese.   . Maintain normal blood lipids and cholesterol levels by exercising and minimizing your intake of saturated fat. Eat a balanced diet with plenty of fruit and vegetables. Blood tests for lipids and cholesterol should begin at age 76 and be repeated every 5 years. If your lipid or cholesterol levels are high, you are over 50, or you are at high risk for heart disease, you may need your cholesterol levels checked  more frequently. Ongoing high lipid and cholesterol levels should be treated with medicines if diet and exercise are not working.  . If you smoke, find out from your health care provider how to quit. If you do not use tobacco, please do not start.  . If you choose to drink alcohol, please do not consume more than 2 drinks per day. One drink is considered to be 12 ounces (355 mL) of beer, 5 ounces (148 mL) of wine, or 1.5 ounces (44 mL) of liquor.  . If you are 75-21 years old, ask your health care provider if you should take aspirin  to prevent strokes.  . Use sunscreen. Apply sunscreen liberally and repeatedly throughout the day. You should seek shade when your shadow is shorter than you. Protect yourself by wearing long sleeves, pants, a wide-brimmed hat, and sunglasses year round, whenever you are outdoors.  . Once a month, do a whole body skin exam, using a mirror to look at the skin on your back. Tell your health care provider of new moles, moles that have irregular borders, moles that are larger than a pencil eraser, or moles that have changed in shape or color.

## 2018-07-26 NOTE — Progress Notes (Signed)
Subjective:   Patty Shelton is a 78 y.o. female who presents for Medicare Annual (Subsequent) preventive examination.  Review of Systems:  N/A Cardiac Risk Factors include: advanced age (>46men, >76 women);dyslipidemia;hypertension     Objective:     Vitals: BP 122/74 (BP Location: Right Arm, Patient Position: Sitting, Cuff Size: Normal)   Pulse 76   Temp 98 F (36.7 C) (Oral)   Ht 5' 5.5" (1.664 m) Comment: no shoes  Wt 174 lb (78.9 kg)   SpO2 95%   BMI 28.51 kg/m   Body mass index is 28.51 kg/m.  Advanced Directives 07/26/2018 07/18/2017 11/27/2016 05/19/2016  Does Patient Have a Medical Advance Directive? No No No No  Would patient like information on creating a medical advance directive? No - Patient declined No - Patient declined Yes (ED - Information included in AVS) -    Tobacco Social History   Tobacco Use  Smoking Status Never Smoker  Smokeless Tobacco Never Used     Counseling given: No   Clinical Intake:  Pre-visit preparation completed: Yes  Pain : No/denies pain Pain Score: 0-No pain     Nutritional Status: BMI 25 -29 Overweight Nutritional Risks: None Diabetes: No  How often do you need to have someone help you when you read instructions, pamphlets, or other written materials from your doctor or pharmacy?: 1 - Never What is the last grade level you completed in school?: 12th grade  Interpreter Needed?: No  Comments: pt lives with spouse Information entered by :: LPinson, LPN  Past Medical History:  Diagnosis Date  . Hyperlipemia   . Hypertension    Past Surgical History:  Procedure Laterality Date  . CATARACT EXTRACTION    . RETINAL DETACHMENT SURGERY     Family History  Problem Relation Age of Onset  . Heart attack Mother   . Stroke Father   . Hyperlipidemia Father   . Dementia Father    Social History   Socioeconomic History  . Marital status: Married    Spouse name: Not on file  . Number of children: Not on file  .  Years of education: Not on file  . Highest education level: Not on file  Occupational History  . Not on file  Social Needs  . Financial resource strain: Not on file  . Food insecurity:    Worry: Not on file    Inability: Not on file  . Transportation needs:    Medical: Not on file    Non-medical: Not on file  Tobacco Use  . Smoking status: Never Smoker  . Smokeless tobacco: Never Used  Substance and Sexual Activity  . Alcohol use: No    Alcohol/week: 0.0 standard drinks  . Drug use: No  . Sexual activity: Yes  Lifestyle  . Physical activity:    Days per week: Not on file    Minutes per session: Not on file  . Stress: Not on file  Relationships  . Social connections:    Talks on phone: Not on file    Gets together: Not on file    Attends religious service: Not on file    Active member of club or organization: Not on file    Attends meetings of clubs or organizations: Not on file    Relationship status: Not on file  Other Topics Concern  . Not on file  Social History Narrative   No living will , no HCPOA. Full code ( reviewed 2015, given packet)  Outpatient Encounter Medications as of 07/26/2018  Medication Sig  . acetaminophen (TYLENOL) 500 MG tablet Take 500 mg by mouth every 6 (six) hours as needed.  Marland Kitchen albuterol (PROVENTIL HFA;VENTOLIN HFA) 108 (90 Base) MCG/ACT inhaler Inhale 2 puffs into the lungs every 6 (six) hours as needed for wheezing or shortness of breath.  . ALPRAZolam (XANAX) 0.25 MG tablet Take 1 tablet (0.25 mg total) by mouth daily as needed for anxiety.  . Biotin 10 MG TABS Take 1 tablet by mouth daily.  . Calcium Carbonate-Vitamin D (CALCIUM 600+D) 600-400 MG-UNIT per tablet Take 1 tablet by mouth daily.  . cetirizine (ZYRTEC) 10 MG tablet Take 10 mg by mouth daily.   Marland Kitchen CRANBERRY PO Take 1 capsule by mouth daily.  . diclofenac sodium (VOLTAREN) 1 % GEL Apply 4 g topically 4 (four) times daily. To knee  . famciclovir (FAMVIR) 250 MG tablet Take 1  tablet (250 mg total) by mouth daily.  . fluticasone (FLONASE) 50 MCG/ACT nasal spray Place 2 sprays into both nostrils as needed.   Marland Kitchen lisinopril-hydrochlorothiazide (PRINZIDE,ZESTORETIC) 10-12.5 MG tablet Take 1 tablet by mouth daily.  . mupirocin nasal ointment (BACTROBAN) 2 % Use small amount in each nostril twice daily for five (5) days. After application, press sides of nose together and gently massage. (Patient taking differently: as needed. Use small amount in each nostril twice daily for five (5) days. After application, press sides of nose together and gently massage.)  . pravastatin (PRAVACHOL) 40 MG tablet Take 1 tablet (40 mg total) by mouth daily.  . traMADol (ULTRAM) 50 MG tablet Take 1 tablet (50 mg total) by mouth every 8 (eight) hours as needed.   No facility-administered encounter medications on file as of 07/26/2018.     Activities of Daily Living In your present state of health, do you have any difficulty performing the following activities: 07/26/2018  Hearing? Y  Vision? N  Difficulty concentrating or making decisions? N  Walking or climbing stairs? N  Dressing or bathing? N  Doing errands, shopping? N  Preparing Food and eating ? N  Using the Toilet? N  In the past six months, have you accidently leaked urine? Y  Do you have problems with loss of bowel control? Y  Managing your Medications? N  Managing your Finances? N  Housekeeping or managing your Housekeeping? N  Some recent data might be hidden    Patient Care Team: Jinny Sanders, MD as PCP - General Copland, Frederico Hamman, MD as Consulting Physician (Sports Medicine) Ralene Bathe, MD as Consulting Physician (Dermatology) Calvert Cantor, MD as Consulting Physician (Ophthalmology) Rodney Langton., MD as Consulting Physician (Dentistry) Vidal Schwalbe Yvetta Coder, FNP as Nurse Practitioner (Family Medicine)    Assessment:   This is a routine wellness examination for Patty Shelton.   Hearing Screening   125Hz  250Hz   500Hz  1000Hz  2000Hz  3000Hz  4000Hz  6000Hz  8000Hz   Right ear:   0 0 40  40    Left ear:   40 40 40  40    Vision Screening Comments: Vision exam in April 2019 with Dr. Bing Plume   Exercise Activities and Dietary recommendations Current Exercise Habits: The patient does not participate in regular exercise at present, Exercise limited by: None identified  Goals    . Follow up with Primary Care Provider     Starting 07/26/2018, I will continue to take medications as prescribed and to keep appointments with PCP as scheduled.        Fall Risk Fall  Risk  07/26/2018 07/18/2017 05/19/2016 05/19/2016 05/22/2015  Falls in the past year? 1 No Yes No No  Comment fell; multiple fractures to left foot - Emmi Telephone Survey: data to providers prior to load - -  Number falls in past yr: 0 - 1 - -  Comment - - Emmi Telephone Survey Actual Response = 1 - -  Injury with Fall? 1 - No - -    Depression Screen PHQ 2/9 Scores 07/26/2018 07/18/2017 05/19/2016 05/22/2015  PHQ - 2 Score 0 0 0 0  PHQ- 9 Score 0 0 - -     Cognitive Function MMSE - Mini Mental State Exam 07/26/2018 07/18/2017 05/19/2016  Orientation to time 5 5 5   Orientation to Place 5 5 5   Registration 3 3 3   Attention/ Calculation 0 0 0  Recall 3 3 3   Language- name 2 objects 0 0 0  Language- repeat 1 1 1   Language- follow 3 step command 3 3 3   Language- read & follow direction 0 0 0  Write a sentence 0 0 0  Copy design 0 0 0  Total score 20 20 20        PLEASE NOTE: A Mini-Cog screen was completed. Maximum score is 20. A value of 0 denotes this part of Folstein MMSE was not completed or the patient failed this part of the Mini-Cog screening.   Mini-Cog Screening Orientation to Time - Max 5 pts Orientation to Place - Max 5 pts Registration - Max 3 pts Recall - Max 3 pts Language Repeat - Max 1 pts Language Follow 3 Step Command - Max 3 pts   Immunization History  Administered Date(s) Administered  . Influenza Split 05/24/2011,  03/01/2013, 04/24/2015  . Influenza Whole 03/27/2009  . Influenza, High Dose Seasonal PF 02/29/2016, 04/07/2017  . Influenza,inj,Quad PF,6+ Mos 04/03/2014  . Influenza-Unspecified 03/13/2018  . Pneumococcal Conjugate-13 04/03/2014, 04/07/2017  . Pneumococcal Polysaccharide-23 03/27/2009  . Tdap 03/11/2011  . Zoster 06/02/2014    Screening Tests Health Maintenance  Topic Date Due  . MAMMOGRAM  05/02/2019  . TETANUS/TDAP  03/10/2021  . INFLUENZA VACCINE  Completed  . DEXA SCAN  Completed  . PNA vac Low Risk Adult  Completed     Plan:     I have personally reviewed, addressed, and noted the following in the patient's chart:  A. Medical and social history B. Use of alcohol, tobacco or illicit drugs  C. Current medications and supplements D. Functional ability and status E.  Nutritional status F.  Physical activity G. Advance directives H. List of other physicians I.  Hospitalizations, surgeries, and ER visits in previous 12 months J.  [place] to include hearing, vision, cognitive, depression L. Referrals and appointments - none  In addition, I have reviewed and discussed with patient certain preventive protocols, quality metrics, and best practice recommendations. A written personalized care plan for preventive services as well as general preventive health recommendations were provided to patient.  See attached scanned questionnaire for additional information.   Signed,   Lindell Noe, MHA, BS, LPN Health Coach

## 2018-07-31 ENCOUNTER — Encounter: Payer: Self-pay | Admitting: Family Medicine

## 2018-07-31 ENCOUNTER — Ambulatory Visit (INDEPENDENT_AMBULATORY_CARE_PROVIDER_SITE_OTHER): Payer: Medicare Other | Admitting: Family Medicine

## 2018-07-31 VITALS — BP 138/60 | HR 72 | Temp 98.0°F | Ht 65.5 in | Wt 177.2 lb

## 2018-07-31 DIAGNOSIS — N183 Chronic kidney disease, stage 3 unspecified: Secondary | ICD-10-CM

## 2018-07-31 DIAGNOSIS — E7800 Pure hypercholesterolemia, unspecified: Secondary | ICD-10-CM | POA: Diagnosis not present

## 2018-07-31 DIAGNOSIS — I10 Essential (primary) hypertension: Secondary | ICD-10-CM

## 2018-07-31 DIAGNOSIS — M8580 Other specified disorders of bone density and structure, unspecified site: Secondary | ICD-10-CM | POA: Diagnosis not present

## 2018-07-31 MED ORDER — PRAVASTATIN SODIUM 40 MG PO TABS: 40.0000 mg | ORAL_TABLET | Freq: Every day | ORAL | 3 refills | Status: DC

## 2018-07-31 MED ORDER — LISINOPRIL-HYDROCHLOROTHIAZIDE 10-12.5 MG PO TABS: 1.0000 | ORAL_TABLET | Freq: Every day | ORAL | 3 refills | Status: DC

## 2018-07-31 MED ORDER — FAMCICLOVIR 250 MG PO TABS: 250.0000 mg | ORAL_TABLET | Freq: Every day | ORAL | 3 refills | Status: DC

## 2018-07-31 NOTE — Assessment & Plan Note (Signed)
Well controlled. Continue current medication.  

## 2018-07-31 NOTE — Assessment & Plan Note (Signed)
Improved control on pravastatin.Marland Kitchen work on [employer].

## 2018-07-31 NOTE — Patient Instructions (Addendum)
Start align for a probiotic to help with stool regularity  Start Benefiber for fiber supplement or increase fiber in food. We will call with bone density results.  Get back to gentle regular exercise... if chest pain or SOB... follow up for further evaluation.

## 2018-07-31 NOTE — Progress Notes (Signed)
Subjective:    Patient ID: Patty Shelton, female    DOB: Dec 13, 1940, 78 y.o.   MRN: [id number]  HPI   The patient presents for complete physical and review of chronic health problems. He/She also has the following acute concerns today: soft mud like BMS 1-2 daily.  The patient saw Candis Musa, LPN for medicare wellness. Note reviewed in detail and important notes copied below.  Health maintenance:  No gaps identified.   Abnormal screenings:   Hearing - failed             Hearing Screening   125Hz  250Hz  500Hz  1000Hz  2000Hz  3000Hz  4000Hz  6000Hz  8000Hz   Right ear:   0 0 40  40    Left ear:   40 40 40  40     Fall risk - hx of single fall Fall Risk  07/26/2018 07/18/2017 05/19/2016 05/19/2016 05/22/2015  Falls in the past year? 1 No Yes No No  Comment fell; multiple fractures to left foot - Emmi Telephone Survey: data to providers prior to load - -  Number falls in past yr: 0 - 1 - -  Comment - - Emmi Telephone Survey Actual Response = 1 - -  Injury with Fall? 1        07/31/18 Today:  Hypertension:    Good control on lisinopril HCTZ BP Readings from Last 3 Encounters:  07/31/18 138/60  07/26/18 122/74  04/13/18 128/82  Using medication without problems or lightheadedness: none Chest pain with exertion:none Edema:none Short of breath:  mildSOB in last year with exertion..she has not been exercising as much given other issues, feels deconditioned, has post nasal drip that contributes Average home BPs: Other issues:  Elevated Cholesterol:  On pravastatin Lab Results  Component Value Date   CHOL 191 07/26/2018   HDL 50.10 07/26/2018   LDLCALC 123 (H) 07/26/2018   LDLDIRECT 180.6 04/04/2011   TRIG 90.0 07/26/2018   CHOLHDL 4 07/26/2018  Using medications without problems: no side effects Muscle aches: no SE Diet compliance: more Exercise: none Other complaints:  CKD: Slight decrease in GFR. Rarely using diclofenac.  She has soft BMs, 1-2 BMs  per day. " mud like" Ongoing  For > 1 year No abd pain, cramping. No change with diet.   Social History /Family History/Past Medical History reviewed in detail and updated in EMR if needed. Blood pressure 138/60, pulse 72, temperature 98 F (36.7 C), temperature source Oral, height 5' 5.5" (1.664 m), weight 177 lb 4 oz (80.4 kg), SpO2 95 %.   Review of Systems  Constitutional: Negative for fatigue and fever.  HENT: Negative for congestion.   Eyes: Negative for pain.  Respiratory: Negative for cough and shortness of breath.   Cardiovascular: Negative for chest pain, palpitations and leg swelling.  Gastrointestinal: Negative for abdominal pain.  Genitourinary: Negative for dysuria and vaginal bleeding.  Musculoskeletal: Negative for back pain.  Neurological: Negative for syncope, light-headedness and headaches.  Psychiatric/Behavioral: Negative for dysphoric mood.       Objective:   Physical Exam Constitutional:      General: She is not in acute distress.    Appearance: Normal appearance. She is well-developed. She is not ill-appearing or toxic-appearing.  HENT:     Head: Normocephalic.     Right Ear: Hearing, tympanic membrane, ear canal and external ear normal.     Left Ear: Hearing, tympanic membrane, ear canal and external ear normal.     Nose: Nose normal.  Eyes:  General: Lids are normal. Lids are everted, no foreign bodies appreciated.     Conjunctiva/sclera: Conjunctivae normal.     Pupils: Pupils are equal, round, and reactive to light.  Neck:     Musculoskeletal: Normal range of motion and neck supple.     Thyroid: No thyroid mass or thyromegaly.     Vascular: No carotid bruit.     Trachea: Trachea normal.  Cardiovascular:     Rate and Rhythm: Normal rate and regular rhythm.     Heart sounds: Normal heart sounds, S1 normal and S2 normal. No murmur. No gallop.   Pulmonary:     Effort: Pulmonary effort is normal. No respiratory distress.     Breath sounds:  Normal breath sounds. No wheezing, rhonchi or rales.  Abdominal:     General: Bowel sounds are normal. There is no distension or abdominal bruit.     Palpations: Abdomen is soft. There is no fluid wave or mass.     Tenderness: There is no abdominal tenderness. There is no guarding or rebound.     Hernia: No hernia is present.  Lymphadenopathy:     Cervical: No cervical adenopathy.  Skin:    General: Skin is warm and dry.     Findings: No rash.  Neurological:     Mental Status: She is alert.     Cranial Nerves: No cranial nerve deficit.     Sensory: No sensory deficit.  Psychiatric:        Mood and Affect: Mood is not anxious or depressed.        Speech: Speech normal.        Behavior: Behavior normal. Behavior is cooperative.        Judgment: Judgment normal.           Assessment & Plan:  The patient's preventative maintenance and recommended screening tests for an annual wellness exam were reviewed in full today. Brought up to date unless services declined.  Counselled on the importance of diet, exercise, and its role in overall health and mortality. The patient's FH and SH was reviewed, including their home life, tobacco status, and drug and alcohol status.   Vaccines: Uptodate with flu, zoster, PCV 23, 13, Tdap No pap indicated/DVE 2014. No cancer in family. No vaginal bleeding. STOP DVE. Mammogram: 04/2018 nml, DXA: osteopenia 2012, nml vit D, plan repeat in 5 years. Colon cancer screen: cologuard  2018

## 2018-07-31 NOTE — Assessment & Plan Note (Signed)
Slight decrease in last year.. follow over time. Increase fluids.

## 2018-08-07 ENCOUNTER — Telehealth: Payer: Self-pay

## 2018-08-07 MED ORDER — LISINOPRIL-HYDROCHLOROTHIAZIDE 10-12.5 MG PO TABS: 1.0000 | ORAL_TABLET | Freq: Every day | ORAL | 3 refills | Status: DC

## 2018-08-07 MED ORDER — FAMCICLOVIR 250 MG PO TABS: 250.0000 mg | ORAL_TABLET | Freq: Every day | ORAL | 3 refills | Status: DC

## 2018-08-07 MED ORDER — PRAVASTATIN SODIUM 40 MG PO TABS: 40.0000 mg | ORAL_TABLET | Freq: Every day | ORAL | 3 refills | Status: DC

## 2018-08-07 NOTE — Telephone Encounter (Signed)
I speak with [place] with [facility] and Octavia Bruckner did not receive electronic submission for lisinopril HCTZ,famvir or pravastatin on 07/31/18. Resending those 3 rx [facility] now [facility]. Tim voiced understanding.

## 2018-08-12 ENCOUNTER — Encounter: Payer: Self-pay | Admitting: Family Medicine

## 2018-08-12 NOTE — Progress Notes (Signed)
Dr. Frederico Hamman T. Regla Fitzgibbon, MD, [facility] [place], [address] Phone: [phone] Fax: [phone]  08/13/2018  Patient: Patty Shelton, MRN: [id number], DOB: Oct 28, 1940, 78 y.o.  Primary Physician:  Jinny Sanders, MD   Chief Complaint  Patient presents with  . Cough    x 2 months   Subjective:   Patty Shelton is a 78 y.o. very pleasant female patient who presents with the following:  She has been coughing for 2 months. She had some mild interstitial changes on CTA chest in 2018, but did not f/u on high res CT of chest.  She is on an ACE inhibitor, also.  Has had post-nasal drip for a long time. Mucous is light green. ? If infection. When cough, there is a lot of rattling.  Last night did not cough hardly any.   Atypical changes, crackles at the bases.  She generally does not feel that well, and she has had a productive cough with some yellow sputum and green sputum for 2 months, and she is worsened over the last 2 weeks.  She is afebrile currently.  Past Medical History, Surgical History, Social History, Family History, Problem List, Medications, and Allergies have been reviewed and updated if relevant.  Patient Active Problem List   Diagnosis Date Noted  . Dysphagia 03/28/2017  . GERD (gastroesophageal reflux disease) 03/28/2017  . MRSA (methicillin resistant staph aureus) culture positive 03/28/2017  . Mixed incontinence 06/24/2016  . CKD (chronic kidney disease) stage 3, GFR 30-59 ml/min (HCC) 05/22/2015  . Advance directive discussed with patient 05/22/2015  . Hearing loss 03/01/2013  . Herpes genitalia 04/28/2011  . Osteopenia 03/22/2011  . Hypertension 02/11/2011  . HYPERCHOLESTEROLEMIA 03/27/2009  . Allergic rhinitis 03/27/2009    Past Medical History:  Diagnosis Date  . Hyperlipemia   . Hypertension     Past Surgical History:  Procedure Laterality Date  . CATARACT EXTRACTION    . RETINAL  DETACHMENT SURGERY      Social History   Socioeconomic History  . Marital status: Married    Spouse name: Not on file  . Number of children: Not on file  . Years of education: Not on file  . Highest education level: Not on file  Occupational History  . Not on file  Social Needs  . Financial resource strain: Not on file  . Food insecurity:    Worry: Not on file    Inability: Not on file  . Transportation needs:    Medical: Not on file    Non-medical: Not on file  Tobacco Use  . Smoking status: Never Smoker  . Smokeless tobacco: Never Used  Substance and Sexual Activity  . Alcohol use: No    Alcohol/week: 0.0 standard drinks  . Drug use: No  . Sexual activity: Yes  Lifestyle  . Physical activity:    Days per week: Not on file    Minutes per session: Not on file  . Stress: Not on file  Relationships  . Social connections:    Talks on phone: Not on file    Gets together: Not on file    Attends religious service: Not on file    Active member of club or organization: Not on file    Attends meetings of clubs or organizations: Not on file    Relationship status: Not on file  . Intimate partner violence:    Fear of current or ex partner: Not  on file    Emotionally abused: Not on file    Physically abused: Not on file    Forced sexual activity: Not on file  Other Topics Concern  . Not on file  Social History Narrative   No living will , no HCPOA. Full code ( reviewed 2015, given packet)    Family History  Problem Relation Age of Onset  . Heart attack Mother   . Stroke Father   . Hyperlipidemia Father   . Dementia Father     Allergies  Allergen Reactions  . Lipitor [Atorvastatin] Other (See Comments)    Aches at 40mg  dose Leg cramps     Medication list reviewed and updated in full in [place].   GEN: No acute illnesses, no fevers, chills. GI: No n/v/d, eating normally Pulm: No SOB Interactive and getting along well at home.  Otherwise, ROS is  as per the HPI.  Objective:   BP (!) 150/80   Pulse 80   Temp 98.7 F (37.1 C) (Oral)   Ht 5' 5.5" (1.664 m)   Wt 174 lb 8 oz (79.2 kg)   SpO2 95%   BMI 28.60 kg/m   GEN: WDWN, NAD, Non-toxic, A & O x 3 HEENT: Atraumatic, Normocephalic. Neck supple. No masses, No LAD. Ears and Nose: No external deformity. CV: RRR, No M/G/R. No JVD. No thrill. No extra heart sounds. PULM: no wheezes, crackles b bases, rhonchi. No retractions. No resp. distress. No accessory muscle use. EXTR: No c/c/e NEURO Normal gait.  PSYCH: Normally interactive. Conversant. Not depressed or anxious appearing.  Calm demeanor.   Laboratory and Imaging Data: CLINICAL DATA:  Productive cough for 1 month with sudden onset of shortness of breath this morning.  EXAM: CT ANGIOGRAPHY CHEST WITH CONTRAST  TECHNIQUE: Multidetector CT imaging of the chest was performed using the standard protocol during bolus administration of intravenous contrast. Multiplanar CT image reconstructions and MIPs were obtained to evaluate the vascular anatomy.  CONTRAST:  75 mL of Isovue 370  COMPARISON:  Chest x-ray from this morning  FINDINGS: Cardiovascular: The heart size is normal. No coronary artery calcifications are seen. The top of the aortic arch was not well assessed as the apices had to be scanned separately due to a scanner malfunction. The second set of images which imaged the top of the aortic arch demonstrated poor opacification of the vessels. Within this limitation, the thoracic aorta is non aneurysmal with no dissection. Mild atherosclerotic changes are seen. No pulmonary emboli.  Mediastinum/Nodes: The esophagus and thyroid are normal. No adenopathy. No effusions.  Lungs/Pleura: Central airways are normal. No pneumothorax. Pleuroparenchymal scarring at the apices is mild. No suspicious infiltrate identified. No evidence of pneumonia. Mild peripheral reticular changes are seen in nondependent  portions of the lungs. No overt edema. Mild atelectasis in the bases. No suspicious nodules or masses.  Upper Abdomen: A low-attenuation lesion in the left hepatic lobe on series 4, image 60 is too small to characterize but probably a cyst. Evaluation of the upper abdomen is otherwise unremarkable.  Musculoskeletal: No chest wall abnormality. No acute or significant osseous findings.  Review of the MIP images confirms the above findings.  IMPRESSION: 1. No pulmonary emboli. 2. Mild peripheral reticular changes in the lungs consistent with mild interstitial changes. 3. Atherosclerosis in the non aneurysmal thoracic aorta.  Aortic Atherosclerosis (ICD10-I70.0).  Assessment and Plan:   Atypical pneumonia  Cough - Plan: DG Chest 2 View  Lung crackles - Plan: DG Chest  2 View  Abnormal chest CT  2 months history of productive cough, concern for atypical pneumonia.  Will treat as such with doxycycline.  Crackles at the bases with a history of some questionable interstitial lung disease on prior chest CT.  Patient did not follow-up with pulmonary recommendations.  Would do a high-resolution chest CT in the future to evaluate this.  I think that this is less urgent now, but if symptoms persist, I will order this if treatment with antibiotics does not resolve her symptoms.  I will alert her primary care doctor also to think about this as well.  Follow-up: No follow-ups on file.  Meds ordered this encounter  Medications  . doxycycline (VIBRA-TABS) 100 MG tablet    Sig: Take 1 tablet (100 mg total) by mouth 2 (two) times daily for 10 days.    Dispense:  20 tablet    Refill:  0   Orders Placed This Encounter  Procedures  . DG Chest 2 View    Signed,  Frederico Hamman T. Quantez Schnyder, MD   Outpatient Encounter Medications as of 08/13/2018  Medication Sig  . acetaminophen (TYLENOL) 500 MG tablet Take 500 mg by mouth every 6 (six) hours as needed.  Marland Kitchen albuterol (PROVENTIL HFA;VENTOLIN  HFA) 108 (90 Base) MCG/ACT inhaler Inhale 2 puffs into the lungs every 6 (six) hours as needed for wheezing or shortness of breath.  . ALPRAZolam (XANAX) 0.25 MG tablet Take 1 tablet (0.25 mg total) by mouth daily as needed for anxiety.  . Biotin 10 MG TABS Take 1 tablet by mouth daily.  . Calcium Carbonate-Vitamin D (CALCIUM 600+D) 600-400 MG-UNIT per tablet Take 1 tablet by mouth daily.  . cetirizine (ZYRTEC) 10 MG tablet Take 10 mg by mouth daily.   Marland Kitchen CRANBERRY PO Take 1 capsule by mouth daily.  . diclofenac sodium (VOLTAREN) 1 % GEL Apply 4 g topically 4 (four) times daily. To knee (Patient taking differently: Apply 4 g topically 4 (four) times daily as needed. To knee)  . famciclovir (FAMVIR) 250 MG tablet Take 1 tablet (250 mg total) by mouth daily.  . fluticasone (FLONASE) 50 MCG/ACT nasal spray Place 2 sprays into both nostrils as needed.   Marland Kitchen lisinopril-hydrochlorothiazide (PRINZIDE,ZESTORETIC) 10-12.5 MG tablet Take 1 tablet by mouth daily.  . mupirocin nasal ointment (BACTROBAN) 2 % Use small amount in each nostril twice daily for five (5) days. After application, press sides of nose together and gently massage. (Patient taking differently: as needed. Use small amount in each nostril twice daily for five (5) days. After application, press sides of nose together and gently massage.)  . pravastatin (PRAVACHOL) 40 MG tablet Take 1 tablet (40 mg total) by mouth daily.  . traMADol (ULTRAM) 50 MG tablet Take 1 tablet (50 mg total) by mouth every 8 (eight) hours as needed.  . doxycycline (VIBRA-TABS) 100 MG tablet Take 1 tablet (100 mg total) by mouth 2 (two) times daily for 10 days.   No facility-administered encounter medications on file as of 08/13/2018.

## 2018-08-13 ENCOUNTER — Encounter: Payer: Self-pay | Admitting: Family Medicine

## 2018-08-13 ENCOUNTER — Ambulatory Visit (INDEPENDENT_AMBULATORY_CARE_PROVIDER_SITE_OTHER)
Admission: RE | Admit: 2018-08-13 | Discharge: 2018-08-13 | Disposition: A | Discharge status: Home / Self Care | Payer: Medicare Other | Source: Ambulatory Visit | Attending: Family Medicine | Admitting: Family Medicine

## 2018-08-13 ENCOUNTER — Ambulatory Visit (INDEPENDENT_AMBULATORY_CARE_PROVIDER_SITE_OTHER): Payer: Medicare Other | Admitting: Family Medicine

## 2018-08-13 VITALS — BP 150/80 | HR 80 | Temp 98.7°F | Ht 65.5 in | Wt 174.5 lb

## 2018-08-13 DIAGNOSIS — R059 Cough, unspecified: Secondary | ICD-10-CM

## 2018-08-13 DIAGNOSIS — R0989 Other specified symptoms and signs involving the circulatory and respiratory systems: Secondary | ICD-10-CM

## 2018-08-13 DIAGNOSIS — R05 Cough: Secondary | ICD-10-CM

## 2018-08-13 DIAGNOSIS — R9389 Abnormal findings on diagnostic imaging of other specified body structures: Secondary | ICD-10-CM | POA: Diagnosis not present

## 2018-08-13 DIAGNOSIS — J189 Pneumonia, unspecified organism: Secondary | ICD-10-CM

## 2018-08-13 MED ORDER — DOXYCYCLINE HYCLATE 100 MG PO TABS: 100.0000 mg | ORAL_TABLET | Freq: Two times a day (BID) | ORAL | 0 refills | Status: DC

## 2018-08-14 ENCOUNTER — Ambulatory Visit: Payer: Medicare Other | Admitting: Family Medicine

## 2018-08-15 ENCOUNTER — Telehealth: Payer: Self-pay | Admitting: Family Medicine

## 2018-08-15 NOTE — Telephone Encounter (Signed)
Chest X-ray results discussed with patient.  See result note.

## 2018-08-15 NOTE — Telephone Encounter (Signed)
Pt returning call about results.

## 2018-08-23 ENCOUNTER — Ambulatory Visit: Payer: Medicare Other | Admitting: Family Medicine

## 2018-08-23 ENCOUNTER — Ambulatory Visit (INDEPENDENT_AMBULATORY_CARE_PROVIDER_SITE_OTHER): Payer: Medicare Other | Admitting: Family Medicine

## 2018-08-23 ENCOUNTER — Encounter: Payer: Self-pay | Admitting: Family Medicine

## 2018-08-23 ENCOUNTER — Other Ambulatory Visit: Payer: Self-pay

## 2018-08-23 VITALS — BP 130/74 | HR 93 | Temp 98.6°F | Ht 65.5 in | Wt 172.0 lb

## 2018-08-23 DIAGNOSIS — R05 Cough: Secondary | ICD-10-CM | POA: Diagnosis not present

## 2018-08-23 DIAGNOSIS — R9389 Abnormal findings on diagnostic imaging of other specified body structures: Secondary | ICD-10-CM | POA: Diagnosis not present

## 2018-08-23 DIAGNOSIS — R053 Chronic cough: Secondary | ICD-10-CM

## 2018-08-23 DIAGNOSIS — R059 Cough, unspecified: Secondary | ICD-10-CM

## 2018-08-23 MED ORDER — DOXYCYCLINE HYCLATE 100 MG PO TABS: 100.0000 mg | ORAL_TABLET | Freq: Two times a day (BID) | ORAL | 0 refills | Status: AC

## 2018-08-23 NOTE — Progress Notes (Signed)
Dr. Frederico Hamman T. Aireanna Luellen, MD, [facility] [place], [address] Phone: [phone] Fax: [phone]  08/23/2018  Patient: Patty Shelton, MRN: [id number], DOB: 06/02/1941, 78 y.o.  Primary Physician:  Jinny Sanders, MD   Chief Complaint  Patient presents with  . Follow-up    Cough   Subjective:   Patty Shelton is a 78 y.o. very pleasant female patient who presents with the following:  Ongoing cough. Crackles on last exam and I placed her on abx for atypical pna.  She has been feeling better, but she has had a persistent cough for 3 months.  On chart review, she has had some intermittent coughing for several years.  ? Interstitial changes on Chest CT, pulmonology recommended f/u high res CT in 2018, but she declined to do this.  But still with crackles.   Past Medical History, Surgical History, Social History, Family History, Problem List, Medications, and Allergies have been reviewed and updated if relevant.  Patient Active Problem List   Diagnosis Date Noted  . Dysphagia 03/28/2017  . GERD (gastroesophageal reflux disease) 03/28/2017  . MRSA (methicillin resistant staph aureus) culture positive 03/28/2017  . Mixed incontinence 06/24/2016  . CKD (chronic kidney disease) stage 3, GFR 30-59 ml/min (HCC) 05/22/2015  . Advance directive discussed with patient 05/22/2015  . Hearing loss 03/01/2013  . Herpes genitalia 04/28/2011  . Osteopenia 03/22/2011  . Hypertension 02/11/2011  . HYPERCHOLESTEROLEMIA 03/27/2009  . Allergic rhinitis 03/27/2009    Past Medical History:  Diagnosis Date  . Hyperlipemia   . Hypertension     Past Surgical History:  Procedure Laterality Date  . CATARACT EXTRACTION    . RETINAL DETACHMENT SURGERY      Social History   Socioeconomic History  . Marital status: Married    Spouse name: Not on file  . Number of children: Not on file  . Years of education: Not on file  .  Highest education level: Not on file  Occupational History  . Not on file  Social Needs  . Financial resource strain: Not on file  . Food insecurity:    Worry: Not on file    Inability: Not on file  . Transportation needs:    Medical: Not on file    Non-medical: Not on file  Tobacco Use  . Smoking status: Never Smoker  . Smokeless tobacco: Never Used  Substance and Sexual Activity  . Alcohol use: No    Alcohol/week: 0.0 standard drinks  . Drug use: No  . Sexual activity: Yes  Lifestyle  . Physical activity:    Days per week: Not on file    Minutes per session: Not on file  . Stress: Not on file  Relationships  . Social connections:    Talks on phone: Not on file    Gets together: Not on file    Attends religious service: Not on file    Active member of club or organization: Not on file    Attends meetings of clubs or organizations: Not on file    Relationship status: Not on file  . Intimate partner violence:    Fear of current or ex partner: Not on file    Emotionally abused: Not on file    Physically abused: Not on file    Forced sexual activity: Not on file  Other Topics Concern  . Not on file  Social History Narrative   No living will ,  no HCPOA. Full code ( reviewed 2015, given packet)    Family History  Problem Relation Age of Onset  . Heart attack Mother   . Stroke Father   . Hyperlipidemia Father   . Dementia Father     Allergies  Allergen Reactions  . Lipitor [Atorvastatin] Other (See Comments)    Aches at 40mg  dose Leg cramps     Medication list reviewed and updated in full in [place].  ROS: GEN: Acute illness details above GI: Tolerating PO intake GU: maintaining adequate hydration and urination Pulm: No SOB Interactive and getting along well at home.  Otherwise, ROS is as per the HPI.  Objective:   BP 130/74   Pulse 93   Temp 98.6 F (37 C) (Oral)   Ht 5' 5.5" (1.664 m)   Wt 172 lb (78 kg)   SpO2 95%   BMI 28.19 kg/m     GEN: A and O x 3. WDWN. NAD.    ENT: Nose clear, ext NML.  No LAD.  No JVD.  TM's clear. Oropharynx clear.  PULM: Normal WOB, no distress. + B basilar crackles, no wheezes, rhonchi. CV: RRR, no M/G/R, No rubs, No JVD.   EXT: warm and well-perfused, No c/c/e. PSYCH: Pleasant and conversant.    Laboratory and Imaging Data: Dg Chest 2 View  Result Date: 08/13/2018 CLINICAL DATA:  Cough for the past 2 months. EXAM: CHEST - 2 VIEW COMPARISON:  08/31/2017. FINDINGS: Normal sized heart. Clear lungs. The lungs remain mildly hyperexpanded with mild diffuse peribronchial thickening and accentuation of the interstitial markings. Mild biapical pleural and parenchymal scarring. Mild to moderate thoracic spine degenerative changes. IMPRESSION: No acute disease.  Mild changes of COPD and chronic bronchitis. Electronically Signed   By: Claudie Revering M.D.   On: 08/13/2018 14:38    CLINICAL DATA:  Productive cough for 1 month with sudden onset of shortness of breath this morning.  EXAM: CT ANGIOGRAPHY CHEST WITH CONTRAST  TECHNIQUE: Multidetector CT imaging of the chest was performed using the standard protocol during bolus administration of intravenous contrast. Multiplanar CT image reconstructions and MIPs were obtained to evaluate the vascular anatomy.  CONTRAST:  75 mL of Isovue 370  COMPARISON:  Chest x-ray from this morning  FINDINGS: Cardiovascular: The heart size is normal. No coronary artery calcifications are seen. The top of the aortic arch was not well assessed as the apices had to be scanned separately due to a scanner malfunction. The second set of images which imaged the top of the aortic arch demonstrated poor opacification of the vessels. Within this limitation, the thoracic aorta is non aneurysmal with no dissection. Mild atherosclerotic changes are seen. No pulmonary emboli.  Mediastinum/Nodes: The esophagus and thyroid are normal. No adenopathy. No effusions.   Lungs/Pleura: Central airways are normal. No pneumothorax. Pleuroparenchymal scarring at the apices is mild. No suspicious infiltrate identified. No evidence of pneumonia. Mild peripheral reticular changes are seen in nondependent portions of the lungs. No overt edema. Mild atelectasis in the bases. No suspicious nodules or masses.  Upper Abdomen: A low-attenuation lesion in the left hepatic lobe on series 4, image 60 is too small to characterize but probably a cyst. Evaluation of the upper abdomen is otherwise unremarkable.  Musculoskeletal: No chest wall abnormality. No acute or significant osseous findings.  Review of the MIP images confirms the above findings.  IMPRESSION: 1. No pulmonary emboli. 2. Mild peripheral reticular changes in the lungs consistent with mild interstitial changes. 3.  Atherosclerosis in the non aneurysmal thoracic aorta.  Aortic Atherosclerosis (ICD10-I70.0).   Electronically Signed   By: Dorise Bullion III M.D   On: 11/27/2016 10:45  Assessment and Plan:   Chronic cough  Abnormal chest CT  Abnormal chest x-ray  Cough - Plan: CT CHEST HIGH RESOLUTION  Persistent cough x 3 months.  Appears improved clinically today but ongoing persistent crackles on exam, no acute process seen on CXR but interstitial markings on CXR and ? COPD.  ? Interstitial disease on prior CT angiogram of chest.  Discussed case with chest radiology, and will obtain a high resolution chest CT to evaluate for interstitial lung disease. Pulmonology recommended this as well in 2018.  Improving, with crackles, I think reasonable to prolong abx  Follow-up: No follow-ups on file.  Meds ordered this encounter  Medications  . doxycycline (VIBRA-TABS) 100 MG tablet    Sig: Take 1 tablet (100 mg total) by mouth 2 (two) times daily for 10 days.    Dispense:  20 tablet    Refill:  0   Signed,  Allex Lapoint T. Amerie Beaumont, MD   Outpatient Encounter Medications as of 08/23/2018   Medication Sig  . acetaminophen (TYLENOL) 500 MG tablet Take 500 mg by mouth every 6 (six) hours as needed.  Marland Kitchen albuterol (PROVENTIL HFA;VENTOLIN HFA) 108 (90 Base) MCG/ACT inhaler Inhale 2 puffs into the lungs every 6 (six) hours as needed for wheezing or shortness of breath.  . ALPRAZolam (XANAX) 0.25 MG tablet Take 1 tablet (0.25 mg total) by mouth daily as needed for anxiety.  . Biotin 10 MG TABS Take 1 tablet by mouth daily.  . Calcium Carbonate-Vitamin D (CALCIUM 600+D) 600-400 MG-UNIT per tablet Take 1 tablet by mouth daily.  . cetirizine (ZYRTEC) 10 MG tablet Take 10 mg by mouth daily.   Marland Kitchen CRANBERRY PO Take 1 capsule by mouth daily.  . diclofenac sodium (VOLTAREN) 1 % GEL Apply 4 g topically 4 (four) times daily. To knee (Patient taking differently: Apply 4 g topically 4 (four) times daily as needed. To knee)  . doxycycline (VIBRA-TABS) 100 MG tablet Take 1 tablet (100 mg total) by mouth 2 (two) times daily for 10 days.  . famciclovir (FAMVIR) 250 MG tablet Take 1 tablet (250 mg total) by mouth daily.  . fluticasone (FLONASE) 50 MCG/ACT nasal spray Place 2 sprays into both nostrils as needed.   Marland Kitchen lisinopril-hydrochlorothiazide (PRINZIDE,ZESTORETIC) 10-12.5 MG tablet Take 1 tablet by mouth daily.  . mupirocin nasal ointment (BACTROBAN) 2 % Use small amount in each nostril twice daily for five (5) days. After application, press sides of nose together and gently massage. (Patient taking differently: as needed. Use small amount in each nostril twice daily for five (5) days. After application, press sides of nose together and gently massage.)  . pravastatin (PRAVACHOL) 40 MG tablet Take 1 tablet (40 mg total) by mouth daily.  . traMADol (ULTRAM) 50 MG tablet Take 1 tablet (50 mg total) by mouth every 8 (eight) hours as needed.  . [DISCONTINUED] doxycycline (VIBRA-TABS) 100 MG tablet Take 1 tablet (100 mg total) by mouth 2 (two) times daily for 10 days.   No facility-administered encounter  medications on file as of 08/23/2018.

## 2018-08-31 ENCOUNTER — Ambulatory Visit: Admission: RE | Admit: 2018-08-31 | Payer: Medicare Other | Source: Ambulatory Visit

## 2018-09-11 ENCOUNTER — Telehealth: Payer: Self-pay | Admitting: *Deleted

## 2018-09-11 MED ORDER — PREDNISONE 20 MG PO TABS: ORAL_TABLET | ORAL | 0 refills | Status: DC

## 2018-09-11 NOTE — Telephone Encounter (Signed)
Patient's husband, Francee Piccolo, advised. Patient was not available and per DPR on file it was ok to speak with husband. RX sent in for Prednisone to [facility].

## 2018-09-11 NOTE — Telephone Encounter (Signed)
I am concerned that this is an underlying lung condition - we have talked about this.   High def CT is reasonable next step - previously Pulmonology recommended this as well.  With the Covid-19 outbreak, non-urgent scans have been delayed.  I think that a round of prednisone will likely help her more than anything.  Prednisone 20 mg, 2 tabs po for 5 days, then 1 tab po for 5 days, #15, 0 ref

## 2018-09-11 NOTE — Telephone Encounter (Signed)
Patient called stating that she was scheduled for an extensive x-ray of her chest and they have called her and rescheduled it for 09/28/18. Patient stated that she is not getting any better after two rounds of antibiotics. Patient stated that she does not have a fever, but has a productive cough/light green. Patient stated that her cough is not getting any better and wants to know what else can be done for her deep cough? Pharmacy [facility]/[place]

## 2018-09-14 ENCOUNTER — Other Ambulatory Visit: Payer: Self-pay

## 2018-09-14 ENCOUNTER — Other Ambulatory Visit (HOSPITAL_COMMUNITY)
Admission: RE | Admit: 2018-09-14 | Discharge: 2018-09-14 | Disposition: A | Discharge status: Home / Self Care | Payer: Medicare Other | Source: Ambulatory Visit | Attending: Family Medicine | Admitting: Family Medicine

## 2018-09-14 ENCOUNTER — Telehealth: Payer: Self-pay | Admitting: Family Medicine

## 2018-09-14 ENCOUNTER — Ambulatory Visit (INDEPENDENT_AMBULATORY_CARE_PROVIDER_SITE_OTHER): Payer: Medicare Other | Admitting: Family Medicine

## 2018-09-14 ENCOUNTER — Encounter: Payer: Self-pay | Admitting: Family Medicine

## 2018-09-14 VITALS — BP 140/68 | HR 82 | Temp 98.4°F | Ht 65.5 in | Wt 170.5 lb

## 2018-09-14 DIAGNOSIS — N950 Postmenopausal bleeding: Secondary | ICD-10-CM | POA: Diagnosis not present

## 2018-09-14 DIAGNOSIS — Z1151 Encounter for screening for human papillomavirus (HPV): Secondary | ICD-10-CM | POA: Diagnosis not present

## 2018-09-14 LAB — POCT URINALYSIS DIPSTICK
Bilirubin, UA: NEGATIVE
Blood, UA: POSITIVE
Glucose, UA: NEGATIVE
Ketones, UA: NEGATIVE
Leukocytes, UA: NEGATIVE
Nitrite, UA: NEGATIVE
Protein, UA: POSITIVE — AB
Spec Grav, UA: 1.02 (ref 1.010–1.025)
Urobilinogen, UA: 0.2 E.U./dL
pH, UA: 6 (ref 5.0–8.0)

## 2018-09-14 NOTE — Telephone Encounter (Signed)
Pt started having vaginal bleeding over night and blood in urine, please advise if pt can come in or have telephone visit

## 2018-09-14 NOTE — Progress Notes (Signed)
Subjective:    Patient ID: Patty Shelton, female    DOB: August 16, 1940, 78 y.o.   MRN: [id number]  HPI This is a 78 year old female who presents to clinic today for evaluation of vaginal bleeding.  This morning she noticed this on pink fluid on her incontinence pad.  She noticed bright red blood on tissue with wiping x2 today.  She denies any abdominal pain, fever, dysuria. Urine was pink. No recent vaginal itching, burning or discharge. No dysuria, frequency. No abdominal pain or fever. Menopause in 43s. No vaginal bleeding since then. No family history of uterine cancer. She is not sexually active.   Undergoing work up for cough, significantly improved after prednisone. Has imaging scheduled for next month.   Past Medical History:  Diagnosis Date  . Hyperlipemia   . Hypertension    Past Surgical History:  Procedure Laterality Date  . CATARACT EXTRACTION    . RETINAL DETACHMENT SURGERY     Family History  Problem Relation Age of Onset  . Heart attack Mother   . Stroke Father   . Hyperlipidemia Father   . Dementia Father    Social History   Tobacco Use  . Smoking status: Never Smoker  . Smokeless tobacco: Never Used  Substance Use Topics  . Alcohol use: No    Alcohol/week: 0.0 standard drinks  . Drug use: No      Review of Systems Per HPI    Objective:   Physical Exam Vitals signs reviewed. Exam conducted with a chaperone present.  Constitutional:      General: She is not in acute distress.    Appearance: Normal appearance. She is normal weight. She is not ill-appearing, toxic-appearing or diaphoretic.  Eyes:     Conjunctiva/sclera: Conjunctivae normal.  Cardiovascular:     Rate and Rhythm: Normal rate.  Pulmonary:     Effort: Pulmonary effort is normal.  Genitourinary:    General: Normal vulva.     Exam position: Supine.     Pubic Area: No rash.      Labia:        Right: No rash, tenderness, lesion or injury.        Left: No rash, tenderness, lesion or  injury.      Vagina: No signs of injury and foreign body. No vaginal discharge, erythema, tenderness, bleeding or lesions.     Cervix: Cervical bleeding (very small amount bright red blood on cervix) present. No cervical motion tenderness, discharge, friability, lesion, erythema or eversion.     Uterus: Normal.      Adnexa: Right adnexa normal and left adnexa normal.     Comments: Vagina pale, loss of rugae.  Neurological:     Mental Status: She is alert.       BP 140/68   Pulse 82   Temp 98.4 F (36.9 C)   Ht 5' 5.5" (1.664 m)   Wt 170 lb 8 oz (77.3 kg)   SpO2 96%   BMI 27.94 kg/m  Wt Readings from Last 3 Encounters:  09/14/18 170 lb 8 oz (77.3 kg)  08/23/18 172 lb (78 kg)  08/13/18 174 lb 8 oz (79.2 kg)   BP Readings from Last 3 Encounters:  09/14/18 140/68  08/23/18 130/74  08/13/18 (!) 150/80       Assessment & Plan:  1. Post-menopausal bleeding - will check pap and [place] - RTC precautions reviewed- increased bleeding, fever - POCT urinalysis dipstick - Cytology - PAP([facility]) - US PELVIC COMPLETE  WITH TRANSVAGINAL; Future   Clarene Reamer, FNP-BC  [facility]  09/14/2018 1:08 PM

## 2018-09-14 NOTE — Patient Instructions (Signed)
Good to see you today  I will be in touch with the results of your Pap test and you should get a call from our office in the next couple of days about scheduling an ultrasound  Please be in touch with [place] if you have bleeding that is more than a little bit with wiping or if you develop fever or abdominal pain

## 2018-09-14 NOTE — Telephone Encounter (Signed)
Spoke with Ms. Zinni.  Appointment scheduled today at 11:15 am with D. Carlean Purl.

## 2018-09-14 NOTE — Telephone Encounter (Signed)
Noted  

## 2018-09-19 LAB — CYTOLOGY - PAP
Diagnosis: NEGATIVE
HPV: NOT DETECTED

## 2018-09-19 NOTE — Addendum Note (Signed)
Addended by: Clarene Reamer B on: 09/19/2018 03:16 PM   Modules accepted: Orders

## 2018-09-20 DIAGNOSIS — L821 Other seborrheic keratosis: Secondary | ICD-10-CM | POA: Diagnosis not present

## 2018-09-20 DIAGNOSIS — D1800 Hemangioma unspecified site: Secondary | ICD-10-CM | POA: Diagnosis not present

## 2018-09-20 DIAGNOSIS — L820 Inflamed seborrheic keratosis: Secondary | ICD-10-CM | POA: Diagnosis not present

## 2018-09-26 ENCOUNTER — Telehealth: Payer: Self-pay | Admitting: Family Medicine

## 2018-09-26 ENCOUNTER — Encounter: Payer: Self-pay | Admitting: Obstetrics & Gynecology

## 2018-09-26 ENCOUNTER — Encounter: Payer: Medicare Other | Admitting: Obstetrics & Gynecology

## 2018-09-26 ENCOUNTER — Other Ambulatory Visit: Payer: Self-pay

## 2018-09-26 VITALS — BP 140/80 | Ht 66.0 in | Wt 172.0 lb

## 2018-09-26 DIAGNOSIS — R053 Chronic cough: Secondary | ICD-10-CM

## 2018-09-26 DIAGNOSIS — R05 Cough: Secondary | ICD-10-CM

## 2018-09-27 NOTE — Telephone Encounter (Signed)
Pt called back to check status of refill. Pt said she finished the first series and was feeling better. She finished the round on Saturday and has noticed everyday since she has been getting more cough and congestion.

## 2018-09-27 NOTE — Telephone Encounter (Signed)
Please schedule virtual appointment with Dr. Diona Browner for Friday for cough and congestion.

## 2018-09-28 ENCOUNTER — Ambulatory Visit: Payer: Medicare Other | Admitting: Family Medicine

## 2018-09-28 ENCOUNTER — Ambulatory Visit: Payer: Medicare Other | Attending: Family Medicine

## 2018-09-28 NOTE — Telephone Encounter (Signed)
Called pt to discuss issue.She apologized for hanging up with Robin in a huff.  Need CT chest as cough chronic but postponed per radiology until June.  No infectious symptoms at this point.  No improvement with antibiotics but did get better with prednisone.  Discussed with pt  that prednisone is not a safe med to continue repeating indefinitely without knowing what we are treating.  She is some better now , so will continue to follow cough ( no SOB) and will make virtual visit next week if not improving.  May need pulm referral for assessment as cannot easily do CT chest currently.  As may be in past due to allergies.Marland Kitchen encouraged daily flonase and change zyrtec  to  Xyzal.

## 2018-09-28 NOTE — Telephone Encounter (Signed)
Left message asking pt to call office  °

## 2018-09-28 NOTE — Telephone Encounter (Signed)
I made pt appointment today 10:30 but wanted to make sure 62min appointment was ok.  When I came back to talk to pt she told me to forget about it and hung up on me before I could say we could call her.  When I was talking to pt she was nervous about doing doxyme appointment.  I tried to explain she would get a text and all she had to do was open text and check in. I also let her know if she couldn't get the virtual appointment to work dr Diona Browner would call her.

## 2018-10-04 ENCOUNTER — Other Ambulatory Visit: Payer: Medicare Other

## 2018-10-04 NOTE — Addendum Note (Signed)
Addended by: Eliezer Lofts E on: 10/04/2018 03:46 PM   Modules accepted: Orders

## 2018-10-04 NOTE — Telephone Encounter (Signed)
Pt left v/m requesting to go ahead and schedule pulmonary referral. FYI to Dr Diona Browner.

## 2018-10-05 ENCOUNTER — Telehealth: Payer: Self-pay | Admitting: Internal Medicine

## 2018-10-05 NOTE — Telephone Encounter (Signed)
Spoke to Deirdre Peer is scheduling patient for next week after her CT scan.

## 2018-10-10 ENCOUNTER — Other Ambulatory Visit: Payer: Self-pay

## 2018-10-10 ENCOUNTER — Ambulatory Visit
Admission: RE | Admit: 2018-10-10 | Discharge: 2018-10-10 | Disposition: A | Discharge status: Home / Self Care | Payer: Medicare Other | Source: Ambulatory Visit | Attending: Family Medicine | Admitting: Family Medicine

## 2018-10-10 DIAGNOSIS — R05 Cough: Secondary | ICD-10-CM

## 2018-10-10 DIAGNOSIS — R059 Cough, unspecified: Secondary | ICD-10-CM

## 2018-10-10 DIAGNOSIS — R0600 Dyspnea, unspecified: Secondary | ICD-10-CM | POA: Diagnosis not present

## 2018-10-11 ENCOUNTER — Encounter: Payer: Self-pay | Admitting: Family Medicine

## 2018-10-11 DIAGNOSIS — I2510 Atherosclerotic heart disease of native coronary artery without angina pectoris: Secondary | ICD-10-CM | POA: Insufficient documentation

## 2018-10-15 ENCOUNTER — Telehealth: Payer: Self-pay | Admitting: Internal Medicine

## 2018-10-15 ENCOUNTER — Other Ambulatory Visit: Payer: Self-pay

## 2018-10-15 ENCOUNTER — Encounter: Payer: Self-pay | Admitting: Internal Medicine

## 2018-10-15 ENCOUNTER — Ambulatory Visit (INDEPENDENT_AMBULATORY_CARE_PROVIDER_SITE_OTHER): Payer: Medicare Other | Admitting: Internal Medicine

## 2018-10-15 VITALS — BP 160/90 | HR 95 | Ht 65.5 in | Wt 168.4 lb

## 2018-10-15 DIAGNOSIS — J411 Mucopurulent chronic bronchitis: Secondary | ICD-10-CM | POA: Diagnosis not present

## 2018-10-15 DIAGNOSIS — K219 Gastro-esophageal reflux disease without esophagitis: Secondary | ICD-10-CM | POA: Diagnosis not present

## 2018-10-15 DIAGNOSIS — J309 Allergic rhinitis, unspecified: Secondary | ICD-10-CM

## 2018-10-15 DIAGNOSIS — R05 Cough: Secondary | ICD-10-CM

## 2018-10-15 DIAGNOSIS — J849 Interstitial pulmonary disease, unspecified: Secondary | ICD-10-CM | POA: Diagnosis not present

## 2018-10-15 DIAGNOSIS — J479 Bronchiectasis, uncomplicated: Secondary | ICD-10-CM | POA: Diagnosis not present

## 2018-10-15 DIAGNOSIS — R053 Chronic cough: Secondary | ICD-10-CM

## 2018-10-15 MED ORDER — PANTOPRAZOLE SODIUM 40 MG PO TBEC: 40.0000 mg | DELAYED_RELEASE_TABLET | Freq: Every day | ORAL | 1 refills | Status: DC

## 2018-10-15 MED ORDER — FLUTTER DEVI: 1.0000 | 0 refills | Status: AC

## 2018-10-15 MED ORDER — FLUTICASONE FUROATE 100 MCG/ACT IN AEPB: 100.0000 ug | INHALATION_SPRAY | Freq: Every day | RESPIRATORY_TRACT | 1 refills | Status: DC

## 2018-10-15 MED ORDER — FLUTICASONE FUROATE 100 MCG/ACT IN AEPB: 1.0000 | INHALATION_SPRAY | Freq: Every day | RESPIRATORY_TRACT | 0 refills | Status: DC

## 2018-10-15 NOTE — Patient Instructions (Addendum)
1. STOP LISINOPRIL  2.START PROTONIX 40 mg daily   3.CONTINUE WITH ZYRTEC AND FLONASE AS PRESCRIBED  4.ALBUTEROL AS NEEDED  5.ARNUITY  INHALER 100 1 PUFF ONCE A DAY  6.FLUTTER VALVE 10-15 times per day

## 2018-10-15 NOTE — Progress Notes (Signed)
[facility] Consultation      Date: 10/15/2018,   MRN# [id number] REASE SWINSON 1940-06-27      Admission                  Current  KLARISSA MCILVAIN is a 78 y.o. old female seen in consultation for cough at the request of [facility]     CHIEF COMPLAINT:   Chronic cough   HISTORY OF PRESENT ILLNESS   78 year old pleasant white female seen today for chronic cough associated with productive mucus in the morning Patient was diagnosed with upper respiratory tract infection in December 2019 since then her cough has been persistent Patient has received several rounds of antibiotics but did not help however patient received prednisone and she significantly felt better  Patient still has persistent chronic cough Patient has not been diagnosed with pneumonia however has been diagnosed with several bouts of chronic bronchitis and acute bronchitis in the past  Patient is a non-smoker patient states that albuterol helps  At this time there is no signs of infection No signs of severe respiratory distress  no signs of heart failure  Patient does have GERD on a daily basis but is not taking any treatment  Patient does have allergic rhinitis she takes Zyrtec and Flonase daily  I have reviewed CT scan findings with the patient there is evidence of 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 There are no significant changes over the last 2 years and her CT scans  Patient does take a an ACE inhibitor    No signs of active infection at this time    PAST MEDICAL HISTORY   Past Medical History:  Diagnosis Date   Hyperlipemia    Hypertension      SURGICAL HISTORY   Past Surgical History:  Procedure Laterality Date   CATARACT EXTRACTION     RETINAL DETACHMENT SURGERY       FAMILY HISTORY   Family History  Problem Relation Age of Onset   Heart attack Mother    Stroke Father    Hyperlipidemia Father      Dementia Father      SOCIAL HISTORY   Social History   Tobacco Use   Smoking status: Never Smoker   Smokeless tobacco: Never Used  Substance Use Topics   Alcohol use: No    Alcohol/week: 0.0 standard drinks   Drug use: No     MEDICATIONS    Home Medication:  Current Outpatient Rx   Order #: [id number] Class: Historical Med   Order #: [id number] Class: Print   Order #: [id number] Class: Print   Order #: [id number] Class: Historical Med   Order #: [id number] Class: Historical Med   Order #: [id number] Class: Historical Med   Order #: [id number] Class: Historical Med   Order #: [id number] Class: Normal   Order #: [id number] Class: Normal   Order #: [id number] Class: Historical Med   Order #: [id number] Class: Normal   Order #: [id number] Class: Normal   Order #: [id number] Class: Normal   Order #: [id number] Class: Normal   Order #: [id number] Class: Normal    Current Medication:  Current Outpatient Medications:    acetaminophen (TYLENOL) 500 MG tablet, Take 500 mg by mouth every 6 (six) hours as needed., Disp: , Rfl:    albuterol (PROVENTIL HFA;VENTOLIN HFA) 108 (90 Base) MCG/ACT inhaler, Inhale 2 puffs into the lungs every 6 (six) hours as needed for wheezing or shortness of breath., Disp: 1 Inhaler, Rfl: 2   ALPRAZolam (  XANAX) 0.25 MG tablet, Take 1 tablet (0.25 mg total) by mouth daily as needed for anxiety., Disp: 20 tablet, Rfl: 0   Biotin 10 MG TABS, Take 1 tablet by mouth daily., Disp: , Rfl:    Calcium Carbonate-Vitamin D (CALCIUM 600+D) 600-400 MG-UNIT per tablet, Take 1 tablet by mouth daily., Disp: , Rfl:    cetirizine (ZYRTEC) 10 MG tablet, Take 10 mg by mouth daily. , Disp: , Rfl:    CRANBERRY PO, Take 1 capsule by mouth daily., Disp: , Rfl:    diclofenac sodium (VOLTAREN) 1 % GEL, Apply 4 g topically 4 (four) times daily. To knee (Patient taking differently: Apply 4 g topically 4 (four) times daily as needed. To knee), Disp: 100 g, Rfl: 1   famciclovir  (FAMVIR) 250 MG tablet, Take 1 tablet (250 mg total) by mouth daily., Disp: 90 tablet, Rfl: 3   fluticasone (FLONASE) 50 MCG/ACT nasal spray, Place 2 sprays into both nostrils as needed. , Disp: , Rfl:    lisinopril-hydrochlorothiazide (PRINZIDE,ZESTORETIC) 10-12.5 MG tablet, Take 1 tablet by mouth daily., Disp: 90 tablet, Rfl: 3   mupirocin nasal ointment (BACTROBAN) 2 %, Use small amount in each nostril twice daily for five (5) days. After application, press sides of nose together and gently massage. (Patient taking differently: as needed. Use small amount in each nostril twice daily for five (5) days. After application, press sides of nose together and gently massage.), Disp: 10 g, Rfl: 0   pravastatin (PRAVACHOL) 40 MG tablet, Take 1 tablet (40 mg total) by mouth daily., Disp: 90 tablet, Rfl: 3   predniSONE (DELTASONE) 20 MG tablet, Take 2 tablets once daily for 5 days, then 1 tablet once daily for 5 days, Disp: 15 tablet, Rfl: 0   traMADol (ULTRAM) 50 MG tablet, Take 1 tablet (50 mg total) by mouth every 8 (eight) hours as needed., Disp: 15 tablet, Rfl: 0    ALLERGIES   Lipitor [atorvastatin]     REVIEW OF SYSTEMS    Review of Systems:  Gen:  Denies  fever, sweats, chills weigh loss  HEENT: Denies blurred vision, double vision, ear pain, eye pain, hearing loss, nose bleeds, sore throat Cardiac:  No dizziness, chest pain or heaviness, chest tightness,edema Resp:   + Cough + sputum porduction,-shortness of breath, + wheezing,-hemoptysis,  Gi: Denies swallowing difficulty, stomach pain, nausea or vomiting, diarrhea, constipation, bowel incontinence Gu:  Denies bladder incontinence, burning urine Ext:   Denies Joint pain, stiffness or swelling Skin: Denies  skin rash, easy bruising or bleeding or hives Endoc:  Denies polyuria, polydipsia , polyphagia or weight change Psych:   Denies depression, insomnia or hallucinations   Other:  All other systems negative   BP (!) 160/90  (BP Location: Left Arm, Cuff Size: Normal)    Pulse 95    Ht 5' 5.5" (1.664 m)    Wt 168 lb 6.4 oz (76.4 kg)    SpO2 95%    BMI 27.60 kg/m     PHYSICAL EXAM  Physical Examination:   GENERAL:NAD, no fevers, chills, no weakness no fatigue HEAD: Normocephalic, atraumatic.  EYES: Pupils equal, round, reactive to light. Extraocular muscles intact. No scleral icterus.  MOUTH: Moist mucosal membrane. Dentition intact. No abscess noted.  EAR, NOSE, THROAT: Clear without exudates. No external lesions.  NECK: Supple. No thyromegaly. No nodules. No JVD.  PULMONARY: Diffuse coarse rhonchi right sided +wheezes CARDIOVASCULAR: S1 and S2. Regular rate and rhythm. No murmurs, rubs, or gallops. No edema. Pedal  pulses 2+ bilaterally.  GASTROINTESTINAL: Soft, nontender, nondistended. No masses. Positive bowel sounds. No hepatosplenomegaly.  MUSCULOSKELETAL: No swelling, clubbing, or edema. Range of motion full in all extremities.  NEUROLOGIC: Cranial nerves II through XII are intact. No gross focal neurological deficits. Sensation intact. Reflexes intact.  SKIN: No ulceration, lesions, rashes, or cyanosis. Skin warm and dry. Turgor intact.  PSYCHIATRIC: Mood, affect within normal limits. The patient is awake, alert and oriented x 3. Insight, judgment intact.  ALL OTHER ROS ARE NEGATIVE     IMAGING    I have Independently reviewed images of  CT chest 10/10/18   on 10/15/2018 Interpretation: minimal b/l lower lobe predominant peripheral  ILD consider NSIP mild bronchiectasis No significant changes since 2018     I have Independently reviewed images of  Ct chest 11/2016   on 10/15/2018 Interpretation:minimal b/l lower lobe predominant peripheral  ILD consider NSIP mild bronchiectasis      Ct Chest High Resolution  Result Date: 10/10/2018 CLINICAL DATA:  Chronic cough. Worsening dyspnea. Crackles on exam. Clinical concern for interstitial lung disease. Never smoker. EXAM: CT CHEST WITHOUT CONTRAST  TECHNIQUE: Multidetector CT imaging of the chest was performed following the standard protocol without intravenous contrast. High resolution imaging of the lungs, as well as inspiratory and expiratory imaging, was performed. COMPARISON:  08/13/2018 chest radiograph. 11/27/2016 chest CT angiogram. FINDINGS: Cardiovascular: Normal heart size. No significant pericardial effusion/thickening. Left anterior descending and right coronary atherosclerosis. Atherosclerotic nonaneurysmal thoracic aorta. Normal caliber pulmonary arteries. Mediastinum/Nodes: Hypodense 1.1 cm right thyroid nodule. Unremarkable esophagus. No pathologically enlarged axillary, mediastinal or hilar lymph nodes, noting limited sensitivity for the detection of hilar adenopathy on this noncontrast study. Lungs/Pleura: No pneumothorax. No pleural effusion. No acute consolidative airspace disease, lung masses or significant pulmonary nodules. No significant air trapping on the expiration sequence. There is mild patchy ground-glass opacity and minimal reticulation throughout both lungs, predominantly in peripheral lower lobes. Associated mild traction bronchiolectasis and minimal architectural distortion. No frank honeycombing. These findings have not definitely progressed since 11/27/2016 chest CT angiogram study, although comparison is limited by mild hypoventilation on the prior scan. Upper abdomen: Small hiatal hernia. Subcentimeter hypodense anterior left liver lobe lesion is too small to characterize and is unchanged, considered benign. Musculoskeletal: No aggressive appearing focal osseous lesions. Mild thoracic spondylosis. IMPRESSION: 1. Spectrum of findings suggestive of mild basilar predominant interstitial lung disease with mild traction bronchiolectasis and ground-glass predominance. No frank honeycombing. No definite progression since 11/27/2016 chest CT angiogram study. Differential includes nonspecific interstitial pneumonia (NSIP) or usual  interstitial pneumonia (UIP). Consider a follow-up high-resolution chest CT study in 12 months to assess temporal pattern stability, as clinically warranted. Findings are indeterminate for UIP per consensus guidelines: Diagnosis of Idiopathic Pulmonary Fibrosis: An Official ATS/ERS/JRS/ALAT Clinical Practice Guideline. [place], Iss 5, [phone], Feb 11 2017. 2. Two vessel coronary atherosclerosis. 3. Small hiatal hernia. Aortic Atherosclerosis (ICD10-I70.0). Electronically Signed   By: Ilona Sorrel M.D.   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  At this time I would recommend the following plan of action  #1 stop ACE inhibitor lisinopril #2 start Protonix 40 mg daily for GERD #3 continue Zyrtec Flonase as  prescribed for allergic rhinitis #4 Will start Arnuity 100 mcg inhaled steroid for probable underlying inflammatory chronic bronchitis #5 continue albuterol as needed #6 flutter valve 10-15 times per day for bronchiectasis and mucus production  No indication for antibiotics or prednisone at this time No indication for further CAT scans No indication for bronchoscopy at this time   Patient and I will contact primary care physician to change medications for her blood pressure  Patient very are satisfied with Plan of action and management. All questions answered Follow-up in 4 weeks  Mehkai Gallo Patricia Pesa, M.D.  [facility]  Medical Director [place] Director [facility] Cardio-Pulmonary Department

## 2018-10-15 NOTE — Telephone Encounter (Signed)
Received PA request from [facility] for [place] 147mcg. Per cover my meds, covered alternatives are flovent Diskus or HFA.  Dr. Mortimer Fries please advise if you would like to switch to alternative or start PA?

## 2018-10-16 MED ORDER — FLUTICASONE PROPIONATE HFA 110 MCG/ACT IN AERO: 2.0000 | INHALATION_SPRAY | Freq: Two times a day (BID) | RESPIRATORY_TRACT | 2 refills | Status: DC

## 2018-10-16 NOTE — Telephone Encounter (Signed)
Left message to make pt aware of change in medication.  Will send Rx after speaking with pt.

## 2018-10-16 NOTE — Telephone Encounter (Signed)
FLOVENT HFA 196mcg 2 puffs twice daily

## 2018-10-16 NOTE — Telephone Encounter (Signed)
Pt is aware of below message and voiced her understanding. Rx for Flovent has been sent to preferred pharmacy. Nothing further is needed.

## 2018-10-19 ENCOUNTER — Other Ambulatory Visit: Payer: Self-pay

## 2018-10-19 NOTE — Telephone Encounter (Signed)
Pt left v/m with her name DOB and phone #. I called pt and left v/m requesting pt to call [place].

## 2018-10-22 NOTE — Telephone Encounter (Signed)
Left v/m on pts cell phone to call office. The home phone no answer and v/m not set up.

## 2018-10-23 MED ORDER — HYDROCHLOROTHIAZIDE 25 MG PO TABS: 25.0000 mg | ORAL_TABLET | Freq: Every day | ORAL | 2 refills | Status: DC

## 2018-10-23 MED ORDER — MUPIROCIN CALCIUM 2 % NA OINT: TOPICAL_OINTMENT | NASAL | 0 refills | Status: DC

## 2018-10-23 NOTE — Telephone Encounter (Signed)
Pt left v/m requesting cb about sharing information with Dr Diona Browner about meds. I left v/m for pt to cb and since having a lot of problem returning a call to pt requested that pt leave a more detailed message if no one answers the phone and pt gets v/m.

## 2018-10-23 NOTE — Telephone Encounter (Signed)
Call pt.. there are many options to change BP med to... offer virtual visit to discuss in detail.  If not interested in virtual visit... given she is on a low dose of lisinopril HCTZ.Marland Kitchen we could stop the lisinopril and just increase the HCTZ to 25 mg daily. If she is agreeable to either option.Marland Kitchen let me know.

## 2018-10-23 NOTE — Telephone Encounter (Signed)
Pt left v/m that the pulmonologist was trying to eliminate meds that might cause a cough. Pt has been on lisinopril for long time and per pt pulmonologist wants Dr Diona Browner to try substitute med for lisinopril-HCTZ. Also pt nose is getting sore and pt has hx of MRSA and pt is requesting refill of mupirocin nasal ointment. Last refilled 10g on 05/25/17. I tried to call pt back about a virtual visit and was unable to reach pt. Pt seen in office for acute visit on 09/14/18. Pt last seen by Dr Diona Browner for CPX on 07/31/18.Please advise.

## 2018-10-23 NOTE — Telephone Encounter (Signed)
Patty Shelton notified as instructed by telephone. She declines virtual visit but is agreeable to changing to HCTZ 25 mg.  She is also asking for a refill on her mupirocin nasal ointment.

## 2018-10-30 ENCOUNTER — Other Ambulatory Visit: Payer: Self-pay | Admitting: Family Medicine

## 2018-10-30 DIAGNOSIS — N950 Postmenopausal bleeding: Secondary | ICD-10-CM

## 2018-10-30 NOTE — Progress Notes (Signed)
Referral to GYN made

## 2018-10-31 DIAGNOSIS — Z124 Encounter for screening for malignant neoplasm of cervix: Secondary | ICD-10-CM | POA: Diagnosis not present

## 2018-10-31 DIAGNOSIS — Z6827 Body mass index (BMI) 27.0-27.9, adult: Secondary | ICD-10-CM | POA: Diagnosis not present

## 2018-11-08 ENCOUNTER — Encounter: Payer: Self-pay | Admitting: *Deleted

## 2018-11-08 ENCOUNTER — Encounter: Payer: Self-pay | Admitting: Family Medicine

## 2018-11-08 ENCOUNTER — Ambulatory Visit (INDEPENDENT_AMBULATORY_CARE_PROVIDER_SITE_OTHER): Payer: Medicare Other | Admitting: Family Medicine

## 2018-11-08 VITALS — BP 171/90 | HR 85 | Ht 65.5 in

## 2018-11-08 DIAGNOSIS — I10 Essential (primary) hypertension: Secondary | ICD-10-CM

## 2018-11-08 DIAGNOSIS — N950 Postmenopausal bleeding: Secondary | ICD-10-CM | POA: Diagnosis not present

## 2018-11-08 MED ORDER — AMLODIPINE BESYLATE 5 MG PO TABS: 5.0000 mg | ORAL_TABLET | Freq: Every day | ORAL | 3 refills | Status: DC

## 2018-11-08 NOTE — Progress Notes (Signed)
VIRTUAL VISIT Due to national recommendations of social distancing due to [place] 19, a virtual visit is felt to be most appropriate for this patient at this time.   I connected with the patient on 11/08/18 at  3:20 PM EDT by virtual telehealth platform and verified that I am speaking with the correct person using two identifiers.   I discussed the limitations, risks, security and privacy concerns of performing an evaluation and management service by  virtual telehealth platform and the availability of in person appointments. I also discussed with the patient that there may be a patient responsible charge related to this service. The patient expressed understanding and agreed to proceed.  Patient location: Home Provider Location: [facility] Participants: Eliezer Lofts and [name]   Chief Complaint  Patient presents with  . Hypertension    BP has been all over the place since changing to only taking HCTZ    History of Present Illness: 78 year old female pt presents for follow up HTN.  Hypertension:  ACEI  stoppd given p[ossibly making cough worse ( pt followed by pulm for bronchiectasis with mild interstitial lung disease. She reports BP now elevated only on HCTZ daily   She is not coughing at night as much. Also now on Arnuity inhaler.  BP Readings from Last 3 Encounters:  11/08/18 (!) 171/90  10/15/18 (!) 160/90  09/26/18 140/80  no HA, no numbness. Using medication without problems or lightheadedness: none Chest pain with exertion:none Edema:none  Short of breath:yes, slightly better Average home BPs:148-171/78-90 Other issues:   COVID 19 screen No recent travel or known exposure to [place] The patient denies respiratory symptoms of COVID 19 at this time.  The importance of social distancing was discussed today.   Review of Systems  Constitutional: Negative for chills and fever.  HENT: Negative for congestion and ear pain.   Eyes: Negative for pain and  redness.  Respiratory: Positive for cough, sputum production and shortness of breath.   Cardiovascular: Negative for chest pain, palpitations and leg swelling.  Gastrointestinal: Negative for abdominal pain, blood in stool, constipation, diarrhea, nausea and vomiting.  Genitourinary: Negative for dysuria.  Musculoskeletal: Negative for falls and myalgias.  Skin: Negative for rash.  Neurological: Negative for dizziness.  Psychiatric/Behavioral: Negative for depression. The patient is not nervous/anxious.       Past Medical History:  Diagnosis Date  . Hyperlipemia   . Hypertension     reports that she has never smoked. She has never used smokeless tobacco. She reports that she does not drink alcohol or use drugs.   Current Outpatient Medications:  .  acetaminophen (TYLENOL) 500 MG tablet, Take 500 mg by mouth every 6 (six) hours as needed., Disp: , Rfl:  .  albuterol (PROVENTIL HFA;VENTOLIN HFA) 108 (90 Base) MCG/ACT inhaler, Inhale 2 puffs into the lungs every 6 (six) hours as needed for wheezing or shortness of breath., Disp: 1 Inhaler, Rfl: 2 .  ALPRAZolam (XANAX) 0.25 MG tablet, Take 1 tablet (0.25 mg total) by mouth daily as needed for anxiety., Disp: 20 tablet, Rfl: 0 .  Biotin 10 MG TABS, Take 1 tablet by mouth daily., Disp: , Rfl:  .  Calcium Carbonate-Vitamin D (CALCIUM 600+D) 600-400 MG-UNIT per tablet, Take 1 tablet by mouth daily., Disp: , Rfl:  .  cetirizine (ZYRTEC) 10 MG tablet, Take 10 mg by mouth daily. , Disp: , Rfl:  .  CRANBERRY PO, Take 1 capsule by mouth daily., Disp: , Rfl:  .  diclofenac sodium (VOLTAREN) 1 % GEL, Apply 4 g topically 4 (four) times daily. To knee (Patient taking differently: Apply 4 g topically 4 (four) times daily as needed. To knee), Disp: 100 g, Rfl: 1 .  famciclovir (FAMVIR) 250 MG tablet, Take 1 tablet (250 mg total) by mouth daily., Disp: 90 tablet, Rfl: 3 .  fluticasone (FLONASE) 50 MCG/ACT nasal spray, Place 2 sprays into both nostrils as  needed. , Disp: , Rfl:  .  fluticasone (FLOVENT HFA) 110 MCG/ACT inhaler, Inhale 2 puffs into the lungs 2 (two) times daily., Disp: 1 Inhaler, Rfl: 2 .  Fluticasone Furoate (ARNUITY ELLIPTA) 100 MCG/ACT AEPB, Inhale 1 puff into the lungs daily., Disp: 1 each, Rfl: 0 .  hydrochlorothiazide (HYDRODIURIL) 25 MG tablet, Take 1 tablet (25 mg total) by mouth daily., Disp: 30 tablet, Rfl: 2 .  mupirocin nasal ointment (BACTROBAN) 2 %, Use small amount in each nostril twice daily for five (5) days. After application, press sides of nose together and gently massage., Disp: 10 g, Rfl: 0 .  pantoprazole (PROTONIX) 40 MG tablet, Take 1 tablet (40 mg total) by mouth daily., Disp: 30 tablet, Rfl: 1 .  pravastatin (PRAVACHOL) 40 MG tablet, Take 1 tablet (40 mg total) by mouth daily., Disp: 90 tablet, Rfl: 3 .  Respiratory Therapy Supplies (FLUTTER) DEVI, 1 Device by Does not apply route as directed., Disp: 1 each, Rfl: 0 .  traMADol (ULTRAM) 50 MG tablet, Take 1 tablet (50 mg total) by mouth every 8 (eight) hours as needed., Disp: 15 tablet, Rfl: 0   Observations/Objective: Blood pressure (!) 171/90, pulse 85, height 5' 5.5" (1.664 m).  Physical Exam  Physical Exam Constitutional:      General: The patient is not in acute distress. Pulmonary:     Effort: Pulmonary effort is normal. No respiratory distress.  Neurological:     Mental Status: The patient is alert and oriented to person, place, and time.  Psychiatric:        Mood and Affect: Mood normal.        Behavior: Behavior normal.   Assessment and Plan   Hypertension Inadequate control on HCTZ alone. Start amlodipine 5 mg daily. Follow BP at home. Follow up in 2 weeks.   ACEI stooped givne possible SE cough.   I discussed the assessment and treatment plan with the patient. The patient was provided an opportunity to ask questions and all were answered. The patient agreed with the plan and demonstrated an understanding of the instructions.    The patient was advised to call back or seek an in-person evaluation if the symptoms worsen or if the condition fails to improve as anticipated.     Eliezer Lofts, MD

## 2018-11-08 NOTE — Assessment & Plan Note (Signed)
Inadequate control on HCTZ alone. Start amlodipine 5 mg daily. Follow BP at home. Follow up in 2 weeks.   ACEI stooped givne possible SE cough.

## 2018-11-09 ENCOUNTER — Telehealth: Payer: Self-pay | Admitting: Internal Medicine

## 2018-11-09 NOTE — Progress Notes (Signed)
Left message asking pt to call office 5/29

## 2018-11-09 NOTE — Telephone Encounter (Signed)
Called patient for COVID-19 pre-screening for in office visit.  Have you recently traveled any where out of the local area in the last 2 weeks? no  Have you been in close contact with a person diagnosed with COVID-19 within the last 2 weeks? no  Do you currently have any of the following symptoms? If so, when did they start? Cough     Diarrhea   Joint Pain Fever      Muscle Pain   Red eyes Shortness of breath-baseline  Abdominal pain  Vomiting Loss of smell    Rash    Sore Throat Headache    Weakness   Bruising or bleeding   Okay to proceed with visit. (date)

## 2018-11-12 ENCOUNTER — Other Ambulatory Visit: Payer: Self-pay

## 2018-11-12 ENCOUNTER — Ambulatory Visit (INDEPENDENT_AMBULATORY_CARE_PROVIDER_SITE_OTHER): Payer: Medicare Other | Admitting: Internal Medicine

## 2018-11-12 ENCOUNTER — Encounter: Payer: Self-pay | Admitting: Internal Medicine

## 2018-11-12 VITALS — BP 124/70 | HR 91 | Ht 65.0 in | Wt 171.4 lb

## 2018-11-12 DIAGNOSIS — J411 Mucopurulent chronic bronchitis: Secondary | ICD-10-CM

## 2018-11-12 NOTE — Patient Instructions (Addendum)
CONTINUE ARNUITY FOR 1 MONTH CONTINUE PROTONIX FOR 1 MONTH FLUTTER VALVE 10-15 times per day CONTINUE ZYRTEC CONTINUE FLONASE

## 2018-11-12 NOTE — Progress Notes (Signed)
Patty [facility]      Date: 11/12/2018,   MRN# [id number] Patty Shelton 04-26-1941      AdmissionWeight: 171 lb 6.4 oz (77.7 kg)                 CurrentWeight: 171 lb 6.4 oz (77.7 kg) Patty Shel[facility] is a 78 y.o. old female seen in [place] for cough at the request of Bledsoe     CHIEF COMPLAINT:   Chronic bronchitis   HISTORY OF PRESENT ILLNESS   Patient's cough has significantly improved But has chronic productive cough in the morning consistent with chronic bronchitis and bronchiectasis  Arnuity has helped significantly PPI has helped significantly Patient stopped ACE inhibitor and switch to calcium channel blocker  Patient is a non-smoker  flutter valve 10-15 times per day as prescribed has helped a lot  Signs of infection at this time No signs of respiratory distress at this time No signs of heart failure  GERD is under control with PPI Patient uses Zyrtec and Flonase daily  Previous review of CT scan shows evidence of mild interstitial lung disease with bronchiectasis No changes of the last 2 years       PAST MEDICAL HISTORY   Past Medical History:  Diagnosis Date  . Hyperlipemia   . Hypertension        SOCIAL HISTORY   Social History   Tobacco Use  . Smoking status: Never Smoker  . Smokeless tobacco: Never Used  Substance Use Topics  . Alcohol use: No    Alcohol/week: 0.0 standard drinks  . Drug use: No     MEDICATIONS    Home Medication:  Current Outpatient Rx  . Order #: [id number] Class: Historical Med  . Order #: [id number] Class: Print  . Order #: [id number] Class: Print  . Order #: [id number] Class: Normal  . Order #: [id number] Class: Historical Med  . Order #: [id number] Class: Historical Med  . Order #: [id number] Class: Historical Med  . Order #: [id number] Class: Historical Med  . Order #: [id number] Class: Normal  . Order #: [id number] Class: Normal  . Order #: [id number] Class: Historical Med  . Order  #: [id number] Class: Sample  . Order #: [id number] Class: Normal  . Order #: [id number] Class: Normal  . Order #: [id number] Class: Normal  . Order #: [id number] Class: Print  . Order #: [id number] Class: Normal    Current Medication:  Current Outpatient Medications:  .  acetaminophen (TYLENOL) 500 MG tablet, Take 500 mg by mouth every 6 (six) hours as needed., Disp: , Rfl:  .  albuterol (PROVENTIL HFA;VENTOLIN HFA) 108 (90 Base) MCG/ACT inhaler, Inhale 2 puffs into the lungs every 6 (six) hours as needed for wheezing or shortness of breath., Disp: 1 Inhaler, Rfl: 2 .  ALPRAZolam (XANAX) 0.25 MG tablet, Take 1 tablet (0.25 mg total) by mouth daily as needed for anxiety., Disp: 20 tablet, Rfl: 0 .  amLODipine (NORVASC) 5 MG tablet, Take 1 tablet (5 mg total) by mouth daily., Disp: 30 tablet, Rfl: 3 .  Biotin 10 MG TABS, Take 1 tablet by mouth daily., Disp: , Rfl:  .  Calcium Carbonate-Vitamin D (CALCIUM 600+D) 600-400 MG-UNIT per tablet, Take 1 tablet by mouth daily., Disp: , Rfl:  .  cetirizine (ZYRTEC) 10 MG tablet, Take 10 mg by mouth daily. , Disp: , Rfl:  .  CRANBERRY PO, Take 1 capsule by mouth daily., Disp: , Rfl:  .  diclofenac sodium (VOLTAREN) 1 % GEL, Apply 4 g topically 4 (four) times daily. To knee (Patient  taking differently: Apply 4 g topically 4 (four) times daily as needed. To knee), Disp: 100 g, Rfl: 1 .  famciclovir (FAMVIR) 250 MG tablet, Take 1 tablet (250 mg total) by mouth daily., Disp: 90 tablet, Rfl: 3 .  fluticasone (FLONASE) 50 MCG/ACT nasal spray, Place 2 sprays into both nostrils as needed. , Disp: , Rfl:  .  Fluticasone Furoate (ARNUITY ELLIPTA) 100 MCG/ACT AEPB, Inhale 1 puff into the lungs daily., Disp: 1 each, Rfl: 0 .  mupirocin nasal ointment (BACTROBAN) 2 %, Use small amount in each nostril twice daily for five (5) days. After application, press sides of nose together and gently massage., Disp: 10 g, Rfl: 0 .  pantoprazole (PROTONIX) 40 MG tablet, Take 1 tablet (40 mg  total) by mouth daily., Disp: 30 tablet, Rfl: 1 .  pravastatin (PRAVACHOL) 40 MG tablet, Take 1 tablet (40 mg total) by mouth daily., Disp: 90 tablet, Rfl: 3 .  Respiratory Therapy Supplies (FLUTTER) DEVI, 1 Device by Does not apply route as directed., Disp: 1 each, Rfl: 0 .  traMADol (ULTRAM) 50 MG tablet, Take 1 tablet (50 mg total) by mouth every 8 (eight) hours as needed., Disp: 15 tablet, Rfl: 0    ALLERGIES   Lipitor [atorvastatin]     REVIEW OF SYSTEMS    Review of Systems:  Gen:  Denies  fever, sweats, chills weigh loss  HEENT: Denies blurred vision, double vision, ear pain, eye pain, hearing loss, nose bleeds, sore throat Cardiac:  No dizziness, chest pain or heaviness, chest tightness,edema, No JVD Resp:   + cough,+sputum production, -shortness of breath,-wheezing, -hemoptysis,  Gi: Denies swallowing difficulty, stomach pain, nausea or vomiting, diarrhea, constipation, bowel incontinence Gu:  Denies bladder incontinence, burning urine Ext:   Denies Joint pain, stiffness or swelling Skin: Denies  skin rash, easy bruising or bleeding or hives Endoc:  Denies polyuria, polydipsia , polyphagia or weight change Psych:   Denies depression, insomnia or hallucinations  Other:  All other systems negative     BP 124/70 (BP Location: Left Arm, Cuff Size: Normal)   Pulse 91   Ht 5\' 5"  (1.651 m)   Wt 171 lb 6.4 oz (77.7 kg)   SpO2 94%   BMI 28.52 kg/m     Physical Examination:   GENERAL:NAD, no fevers, chills, no weakness no fatigue HEAD: Normocephalic, atraumatic.  EYES: PERLA, EOMI No scleral icterus.  MOUTH: Moist mucosal membrane.  EAR, NOSE, THROAT: Clear without exudates. No external lesions.  NECK: Supple. No thyromegaly.  No JVD.  PULMONARY: CTA B/L no wheezing, rhonchi, crackles CARDIOVASCULAR: S1 and S2. Regular rate and rhythm. No murmurs GASTROINTESTINAL: Soft, nontender, nondistended. Positive bowel sounds.  MUSCULOSKELETAL: No swelling, clubbing, or  edema.  NEUROLOGIC: No gross focal neurological deficits. 5/5 strength all extremities SKIN: No ulceration, lesions, rashes, or cyanosis.  PSYCHIATRIC: Insight, judgment intact. 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 bronchiectasis No significant changes since 2018     I have Independently reviewed images of  CT chest 11/2016    Interpretation:minimal b/l lower lobe predominant peripheral  ILD consider NSIP mild bronchiectasis      ASSESSMENT/PLAN   78 year old pleasant white female seen today for follow-up chronic productive cough with chronic bronchitis in the setting of chronic allergic rhinitis with GERD  in the setting of bronchiectasis with 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  The importance of social distancing was discussed today. Hand Washing Techniques and avoid touching face was advised.  MEDICATION ADJUSTMENTS/LABS AND TESTS ORDERED: Plan to reevaluate medication usage at next office visit in 1 month  Pat[facility] [place]   Patient satisfied with Plan of action and management. All questions answered Follow up in 4 weeks   Bryttani Blew Patricia Pesa, M.D.  [facility]  Medical Director [place] Director  [facility] Cardio-Pulmonary Department

## 2018-11-13 NOTE — Progress Notes (Signed)
Appointment 6/11  Pt aware

## 2018-11-21 ENCOUNTER — Other Ambulatory Visit: Payer: Self-pay | Admitting: Internal Medicine

## 2018-11-21 DIAGNOSIS — K219 Gastro-esophageal reflux disease without esophagitis: Secondary | ICD-10-CM

## 2018-11-21 MED ORDER — PANTOPRAZOLE SODIUM 40 MG PO TBEC: 40.0000 mg | DELAYED_RELEASE_TABLET | Freq: Every day | ORAL | 2 refills | Status: DC

## 2018-11-21 MED ORDER — FLUTICASONE FUROATE 100 MCG/ACT IN AEPB: 1.0000 | INHALATION_SPRAY | Freq: Every day | RESPIRATORY_TRACT | 0 refills | Status: DC

## 2018-11-21 NOTE — Telephone Encounter (Signed)
Pt got letter from [place] about using [facility] they will not pay for prescriptions if she continues to go to [facility].  [facility] let her know about 2 prescriptions for pick up but she cancelled them and wants [facility]. Pt does not remember what they were "last ones doctor sent" she assume.

## 2018-11-21 NOTE — Telephone Encounter (Signed)
I spoke to patient, and we have sent the rx for arnuity and protonix on to [facility] per her request. Nothing further needed at this time.

## 2018-11-22 ENCOUNTER — Ambulatory Visit (INDEPENDENT_AMBULATORY_CARE_PROVIDER_SITE_OTHER): Payer: Medicare Other | Admitting: Family Medicine

## 2018-11-22 ENCOUNTER — Encounter: Payer: Self-pay | Admitting: Family Medicine

## 2018-11-22 DIAGNOSIS — I10 Essential (primary) hypertension: Secondary | ICD-10-CM | POA: Diagnosis not present

## 2018-11-22 NOTE — Progress Notes (Signed)
VIRTUAL VISIT Due to national recommendations of social distancing due to [place] 19, a virtual visit is felt to be most appropriate for this patient at this time.   I connected with the patient on 11/22/18 at 11:20 AM EDT by virtual telehealth platform and verified that I am speaking with the correct person using two identifiers.   I discussed the limitations, risks, security and privacy concerns of performing an evaluation and management service by  virtual telehealth platform and the availability of in person appointments. I also discussed with the patient that there may be a patient responsible charge related to this service. The patient expressed understanding and agreed to proceed.  Patient location: Home Provider Location: [facility] Participants: Eliezer Lofts and [name]   Chief Complaint  Patient presents with  . Follow-up    Blood Pressure    History of Present Illness: 78 year old female presents for follow up HTN  Hypertension:  Had to stop ACEI given cough SE.  Now on amlodipine in addition to HCTZ x 2 weeks  She reports she has noted some improvement in BP.  BP Readings from Last 3 Encounters:  11/22/18 (!) 143/80  11/12/18 124/70  11/08/18 (!) 171/90  Using medication without problems or lightheadedness: none Chest pain with exertion:none Edema:none Short of breath:none Average home BPs:132/70-143/80 HR92 Other issues:   COVID 19 screen No recent travel or known exposure to [place] The patient denies respiratory symptoms of COVID 19 at this time.  The importance of social distancing was discussed today.   Review of Systems  Constitutional: Negative for chills and fever.  HENT: Negative for congestion and ear pain.   Eyes: Negative for pain and redness.  Respiratory: Negative for cough and shortness of breath.   Cardiovascular: Negative for chest pain, palpitations and leg swelling.  Gastrointestinal: Negative for abdominal pain, blood in  stool, constipation, diarrhea, nausea and vomiting.  Genitourinary: Negative for dysuria.  Musculoskeletal: Negative for falls and myalgias.  Skin: Negative for rash.  Neurological: Negative for dizziness.  Psychiatric/Behavioral: Negative for depression. The patient is not nervous/anxious.       Past Medical History:  Diagnosis Date  . Hyperlipemia   . Hypertension     reports that she has never smoked. She has never used smokeless tobacco. She reports that she does not drink alcohol or use drugs.   Current Outpatient Medications:  .  acetaminophen (TYLENOL) 500 MG tablet, Take 500 mg by mouth every 6 (six) hours as needed., Disp: , Rfl:  .  albuterol (PROVENTIL HFA;VENTOLIN HFA) 108 (90 Base) MCG/ACT inhaler, Inhale 2 puffs into the lungs every 6 (six) hours as needed for wheezing or shortness of breath., Disp: 1 Inhaler, Rfl: 2 .  ALPRAZolam (XANAX) 0.25 MG tablet, Take 1 tablet (0.25 mg total) by mouth daily as needed for anxiety., Disp: 20 tablet, Rfl: 0 .  amLODipine (NORVASC) 5 MG tablet, Take 1 tablet (5 mg total) by mouth daily., Disp: 30 tablet, Rfl: 3 .  Biotin 10 MG TABS, Take 1 tablet by mouth daily., Disp: , Rfl:  .  Calcium Carbonate-Vitamin D (CALCIUM 600+D) 600-400 MG-UNIT per tablet, Take 1 tablet by mouth daily., Disp: , Rfl:  .  cetirizine (ZYRTEC) 10 MG tablet, Take 10 mg by mouth daily. , Disp: , Rfl:  .  CRANBERRY PO, Take 1 capsule by mouth daily., Disp: , Rfl:  .  diclofenac sodium (VOLTAREN) 1 % GEL, Apply 4 g topically 4 (four) times daily  as needed., Disp: , Rfl:  .  famciclovir (FAMVIR) 250 MG tablet, Take 1 tablet (250 mg total) by mouth daily., Disp: 90 tablet, Rfl: 3 .  fluticasone (FLONASE) 50 MCG/ACT nasal spray, Place 2 sprays into both nostrils as needed. , Disp: , Rfl:  .  Fluticasone Furoate (ARNUITY ELLIPTA) 100 MCG/ACT AEPB, Inhale 1 puff into the lungs daily., Disp: 90 each, Rfl: 0 .  hydrochlorothiazide (HYDRODIURIL) 25 MG tablet, Take 25 mg by  mouth daily., Disp: , Rfl:  .  mupirocin nasal ointment (BACTROBAN) 2 %, Use small amount in each nostril twice daily for five (5) days. After application, press sides of nose together and gently massage., Disp: 10 g, Rfl: 0 .  pantoprazole (PROTONIX) 40 MG tablet, Take 1 tablet (40 mg total) by mouth daily., Disp: 30 tablet, Rfl: 2 .  pravastatin (PRAVACHOL) 40 MG tablet, Take 1 tablet (40 mg total) by mouth daily., Disp: 90 tablet, Rfl: 3 .  Respiratory Therapy Supplies (FLUTTER) DEVI, 1 Device by Does not apply route as directed., Disp: 1 each, Rfl: 0 .  traMADol (ULTRAM) 50 MG tablet, Take 1 tablet (50 mg total) by mouth every 8 (eight) hours as needed., Disp: 15 tablet, Rfl: 0   Observations/Objective: Blood pressure (!) 143/80, pulse 84.  Physical Exam  Physical Exam Constitutional:      General: The patient is not in acute distress. Pulmonary:     Effort: Pulmonary effort is normal. No respiratory distress.  Neurological:     Mental Status: The patient is alert and oriented to person, place, and time.  Psychiatric:        Mood and Affect: Mood normal.        Behavior: Behavior normal.   Assessment and Plan Hypertension Improved control and no side effects of amlodipine and HCTZ . Follow BP  at home. Goal < 150/90     I discussed the assessment and treatment plan with the patient. The patient was provided an opportunity to ask questions and all were answered. The patient agreed with the plan and demonstrated an understanding of the instructions.   The patient was advised to call back or seek an in-person evaluation if the symptoms worsen or if the condition fails to improve as anticipated.     Eliezer Lofts, MD

## 2018-11-22 NOTE — Assessment & Plan Note (Signed)
Improved control and no side effects of amlodipine and HCTZ . Follow BP  at home. Goal < 150/90

## 2018-12-07 ENCOUNTER — Other Ambulatory Visit: Payer: Medicare Other

## 2018-12-07 ENCOUNTER — Other Ambulatory Visit: Payer: Self-pay | Admitting: *Deleted

## 2018-12-07 MED ORDER — HYDROCHLOROTHIAZIDE 25 MG PO TABS: 25.0000 mg | ORAL_TABLET | Freq: Every day | ORAL | 1 refills | Status: DC

## 2018-12-07 MED ORDER — AMLODIPINE BESYLATE 5 MG PO TABS: 5.0000 mg | ORAL_TABLET | Freq: Every day | ORAL | 3 refills | Status: DC

## 2018-12-12 DIAGNOSIS — Z961 Presence of intraocular lens: Secondary | ICD-10-CM | POA: Diagnosis not present

## 2018-12-12 DIAGNOSIS — H338 Other retinal detachments: Secondary | ICD-10-CM | POA: Diagnosis not present

## 2018-12-12 DIAGNOSIS — H04123 Dry eye syndrome of bilateral lacrimal glands: Secondary | ICD-10-CM | POA: Diagnosis not present

## 2018-12-12 DIAGNOSIS — H43393 Other vitreous opacities, bilateral: Secondary | ICD-10-CM | POA: Diagnosis not present

## 2018-12-12 DIAGNOSIS — H52223 Regular astigmatism, bilateral: Secondary | ICD-10-CM | POA: Diagnosis not present

## 2018-12-12 DIAGNOSIS — H524 Presbyopia: Secondary | ICD-10-CM | POA: Diagnosis not present

## 2018-12-19 ENCOUNTER — Telehealth: Payer: Self-pay | Admitting: Internal Medicine

## 2018-12-19 NOTE — Telephone Encounter (Signed)

## 2018-12-20 ENCOUNTER — Encounter: Payer: Self-pay | Admitting: Internal Medicine

## 2018-12-20 ENCOUNTER — Ambulatory Visit (INDEPENDENT_AMBULATORY_CARE_PROVIDER_SITE_OTHER): Payer: Medicare Other | Admitting: Internal Medicine

## 2018-12-20 ENCOUNTER — Other Ambulatory Visit: Payer: Self-pay

## 2018-12-20 VITALS — BP 140/68 | HR 88

## 2018-12-20 DIAGNOSIS — J411 Mucopurulent chronic bronchitis: Secondary | ICD-10-CM

## 2018-12-20 DIAGNOSIS — J479 Bronchiectasis, uncomplicated: Secondary | ICD-10-CM

## 2018-12-20 NOTE — Progress Notes (Signed)
[facility] Consultation      Date: 12/20/2018,   MRN# [id number] TVISHA SCHWOERER 1940/08/22      DEVON KINGDON is a 78 y.o. old female seen in consultation for cough at the request of B[place]so[place]     CHIEF COMPLAINT:   Follow-up chronic bronchitis   HISTORY OF PRESENT ILLNESS   Patient is coughing significantly improved since last visit Still has some chronic productive cough in the morning consistent with chronic bronchitis and bronchiectasis  Arnuity is has helped significantly PPI has helped significantly Patient stopped ACE inhibitor and switch to calcium channel blocker  She is a non-smoker She uses flutter valve 10-15 times per day as prescribed and has helped a lot  No evidence of infection at this time No evidence of respiratory distress at this time  GERD is under control with PPI Uses Zyrtec and Flonase daily   Previous review of CT scan shows evidence of mild interstitial lung disease with bronchiectasis No changes in the last 2 years    Past Medical History:  Diagnosis Date   Hyperlipemia    Hypertension     Social History   Tobacco Use   Smoking status: Never Smoker   Smokeless tobacco: Never Used  Substance Use Topics   Alcohol use: No    Alcohol/week: 0.0 standard drinks   Drug use: No     MEDICATIONS    Home Medication:  Current Outpatient Rx   Order #: [id number] Class: Historical Med   Order #: [id number] Class: Print   Order #: [id number] Class: Print   Order #: [id number] Class: Normal   Order #: [id number] Class: Historical Med   Order #: [id number] Class: Historical Med   Order #: [id number] Class: Historical Med   Order #: [id number] Class: Historical Med   Order #: [id number] Class: Historical Med   Order #: [id number] Class: Normal   Order #: [id number] Class: Historical Med   Order #: [id number] Class: Normal   Order #: [id number] Class: Normal   Order #: [id number] Class: Normal   Order #:  [id number] Class: Normal   Order #: [id number] Class: Normal   Order #: [id number] Class: Print   Order #: [id number] Class: Normal    Current Medication:  Current Outpatient Medications:    acetaminophen (TYLENOL) 500 MG tablet, Take 500 mg by mouth every 6 (six) hours as needed., Disp: , Rfl:    albuterol (PROVENTIL HFA;VENTOLIN HFA) 108 (90 Base) MCG/ACT inhaler, Inhale 2 puffs into the lungs every 6 (six) hours as needed for wheezing or shortness of breath., Disp: 1 Inhaler, Rfl: 2   ALPRAZolam (XANAX) 0.25 MG tablet, Take 1 tablet (0.25 mg total) by mouth daily as needed for anxiety., Disp: 20 tablet, Rfl: 0   amLODipine (NORVASC) 5 MG tablet, Take 1 tablet (5 mg total) by mouth daily., Disp: 30 tablet, Rfl: 3   Biotin 10 MG TABS, Take 1 tablet by mouth daily., Disp: , Rfl:    Calcium Carbonate-Vitamin D (CALCIUM 600+D) 600-400 MG-UNIT per tablet, Take 1 tablet by mouth daily., Disp: , Rfl:    cetirizine (ZYRTEC) 10 MG tablet, Take 10 mg by mouth daily. , Disp: , Rfl:    CRANBERRY PO, Take 1 capsule by mouth daily., Disp: , Rfl:    diclofenac sodium (VOLTAREN) 1 % GEL, Apply 4 g topically 4 (four) times daily as needed., Disp: , Rfl:    famciclovir (FAMVIR) 250 MG tablet, Take 1 tablet (250 mg total) by mouth daily., Disp: 90 tablet, Rfl: 3   fluticasone (FLONASE) 50 MCG/ACT nasal spray, Place 2  sprays into both nostrils as needed. , Disp: , Rfl:    Fluticasone Furoate (ARNUITY ELLIPTA) 100 MCG/ACT AEPB, Inhale 1 puff into the lungs daily., Disp: 90 each, Rfl: 0   hydrochlorothiazide (HYDRODIURIL) 25 MG tablet, Take 1 tablet (25 mg total) by mouth daily., Disp: 90 tablet, Rfl: 1   mupirocin nasal ointment (BACTROBAN) 2 %, Use small amount in each nostril twice daily for five (5) days. After application, press sides of nose together and gently massage., Disp: 10 g, Rfl: 0   pantoprazole (PROTONIX) 40 MG tablet, Take 1 tablet (40 mg total) by mouth daily., Disp: 30 tablet, Rfl:  2   pravastatin (PRAVACHOL) 40 MG tablet, Take 1 tablet (40 mg total) by mouth daily., Disp: 90 tablet, Rfl: 3   Respiratory Therapy Supplies (FLUTTER) DEVI, 1 Device by Does not apply route as directed., Disp: 1 each, Rfl: 0   traMADol (ULTRAM) 50 MG tablet, Take 1 tablet (50 mg total) by mouth every 8 (eight) hours as needed., Disp: 15 tablet, Rfl: 0    ALLERGIES   Lipitor [atorvastatin]     REVIEW OF SYSTEMS     Review of Systems:  Gen:  Denies  fever, sweats, chills weigh loss  HEENT: Denies blurred vision, double vision, ear pain, eye pain, hearing loss, nose bleeds, sore throat Cardiac:  No dizziness, chest pain or heaviness, chest tightness,edema, No JVD Resp:   No cough, -sputum production, -shortness of breath,-wheezing, -hemoptysis,  Gi: Denies swallowing difficulty, stomach pain, nausea or vomiting, diarrhea, constipation, bowel incontinence Gu:  Denies bladder incontinence, burning urine Ext:   Denies Joint pain, stiffness or swelling Skin: Denies  skin rash, easy bruising or bleeding or hives Endoc:  Denies polyuria, polydipsia , polyphagia or weight change Psych:   Denies depression, insomnia or hallucinations  Other:  All other systems negative   BP 140/68 (BP Location: Right Arm, Cuff Size: Normal)    Pulse 88    SpO2 95%   Physical Examination:   GENERAL:NAD, no fevers, chills, no weakness no fatigue HEAD: Normocephalic, atraumatic.  EYES: PERLA, EOMI No scleral icterus.  NECK: Supple. No thyromegaly.  No JVD.  PULMONARY: CTA B/L no wheezing, rhonchi, crackles CARDIOVASCULAR: S1 and S2. Regular rate and rhythm. No murmurs GASTROINTESTINAL: Soft, nontender, nondistended. Positive bowel sounds.  MUSCULOSKELETAL: No swelling, clubbing, or edema.  NEUROLOGIC: No gross focal neurological deficits. 5/5 strength all extremities SKIN: No ulceration, lesions, rashes, or cyanosis.  PSYCHIATRIC: Insight, judgment intact. 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      ASSESSMENT/PLAN   78 year old pleasant white female seen today for follow-up chronic productive cough with chronic bronchitis in the setting of chronic allergic rhinitis with GERD in the setting of bronchiectasis 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  The importance of social distancing was discussed today. Hand Washing Techniques and avoid touching face was advised.  MEDICATION ADJUSTMENTS/LABS AND TESTS ORDERED:  CONTINUE ARNUITY AS PRESCRIBED  ALBUTEROL AS NEEDED  CONTINUE FLUTTER VALVE 10 times per day  Avoid allergens   CURRENT MEDICATIONS REVIEWED AT LENGTH WITH PATIENT TODAY   Patient satisfied with Plan of action and management. All questions answered  Follow up in 6 months    Reinhold Rickey Patricia Pesa, M.D.  [facility]  Medical Director [place] Director [facility] Cardio-Pulmonary Department

## 2018-12-20 NOTE — Patient Instructions (Signed)
CONTINUE ARNUITY AS PRESCRIBED  ALBUTEROL AS NEEDED  CONTINUE FLUTTER VALVE 10 times per day  Avoid allergens

## 2019-02-12 ENCOUNTER — Other Ambulatory Visit: Payer: Medicare Other

## 2019-02-21 ENCOUNTER — Ambulatory Visit (INDEPENDENT_AMBULATORY_CARE_PROVIDER_SITE_OTHER): Payer: Medicare Other

## 2019-02-21 ENCOUNTER — Encounter: Payer: Self-pay | Admitting: Podiatry

## 2019-02-21 ENCOUNTER — Ambulatory Visit (INDEPENDENT_AMBULATORY_CARE_PROVIDER_SITE_OTHER): Payer: Medicare Other | Admitting: Podiatry

## 2019-02-21 ENCOUNTER — Other Ambulatory Visit: Payer: Self-pay | Admitting: Podiatry

## 2019-02-21 ENCOUNTER — Other Ambulatory Visit: Payer: Self-pay

## 2019-02-21 DIAGNOSIS — M7752 Other enthesopathy of left foot: Secondary | ICD-10-CM

## 2019-02-21 DIAGNOSIS — M79672 Pain in left foot: Secondary | ICD-10-CM | POA: Diagnosis not present

## 2019-02-21 DIAGNOSIS — S92902B Unspecified fracture of left foot, initial encounter for open fracture: Secondary | ICD-10-CM

## 2019-02-21 DIAGNOSIS — M2012 Hallux valgus (acquired), left foot: Secondary | ICD-10-CM | POA: Diagnosis not present

## 2019-02-21 DIAGNOSIS — W450XXS Nail entering through skin, sequela: Secondary | ICD-10-CM

## 2019-02-21 DIAGNOSIS — W450XXA Nail entering through skin, initial encounter: Secondary | ICD-10-CM | POA: Insufficient documentation

## 2019-02-21 MED ORDER — MELOXICAM 15 MG PO TABS: 15.0000 mg | ORAL_TABLET | Freq: Every day | ORAL | 0 refills | Status: DC

## 2019-02-21 NOTE — Progress Notes (Signed)
This patient presents the office stating that she had severe pain in the ball of her left foot over the weekend.  She states she wore shoes that caused her left forefoot to be very painful.  She has a history of having metatarsal fractures of the third and fourth metatarsal head.  She was treated for the fractures in May and June 2020.  She says that the pain from the fractures had resolved.  She says that she started to develop intermittent pain in her left forefoot but this weekend the pain was severe.  She presents the office today for an evaluation and treatment of this condition.  She denies any trauma or injury to her left forefoot except for wearing foot gear this weekend.  Patient is also concerned about the toenail on her left great toe.  She says this nail came off and has regrown and become very thick and disfigured and she is afraid to trim her nail herself.  Vascular  Dorsalis pedis and posterior tibial pulses are palpable  B/L.  Capillary return  WNL.  Temperature gradient is  WNL.  Skin turgor  WNL  Sensorium  Senn Weinstein monofilament wire  WNL. Normal tactile sensation.  Nail Exam  Patient has normal nails with no evidence of bacterial or fungal infection. Patient has thick disfigured discolored great toenail left foot.  Orthopedic  Exam  Muscle tone and muscle strength  WNL.  No limitations of motion feet  B/L.  No crepitus or joint effusion noted. HAV 1st MPJ left foot.  Palpable pain from plantar pressure second metatarsal left foot. Mild elevation second toe left foot.  Skin  No open lesions.  Normal skin texture and turgor. Redness noted over metatarsal heads left foot.  Capsulitis 2nd  MPJ left foot. HAV  Left foot.  Nail Injury sequellae left hallux  ROV.  Examination of her left foot does reveal palpable pain noted second MPJ left foot.  This pain is accentuated due to the bunion of the big toe joint left foot.. X-rays taken do reveal an elongated second metatarsal with  healing noted at the third and fourth metatarsal heads left foot.  HAV at the deformity noted left foot on x-ray.  The third and fourth metatarsals could have dorsiflex and shortened allowing additional pressure to the head of the second metatarsal.  Discussed this condition with this patient.  Chose to apply padding and prescribe Mobic for this patient.  Patient to return to the office in 4 weeks and we will consider an evaluation by the pedorthist  and possible injection therapy at that time.  Debridement of the left hallux toenail was performed.  We chose to treat her nail conservatively today and to consider surgical removal in the future if necessary.     Gardiner Barefoot DPM

## 2019-03-25 ENCOUNTER — Ambulatory Visit (INDEPENDENT_AMBULATORY_CARE_PROVIDER_SITE_OTHER): Payer: Self-pay | Admitting: Podiatry

## 2019-03-25 ENCOUNTER — Other Ambulatory Visit: Payer: Self-pay

## 2019-03-25 ENCOUNTER — Encounter: Payer: Self-pay | Admitting: Podiatry

## 2019-03-25 DIAGNOSIS — M7752 Other enthesopathy of left foot: Secondary | ICD-10-CM

## 2019-03-25 DIAGNOSIS — M2012 Hallux valgus (acquired), left foot: Secondary | ICD-10-CM

## 2019-03-25 NOTE — Progress Notes (Signed)
This patient returns to the office for capsulitis second MPJ left foot as well as a a nail dystrophy.  She said she says she wants to hold up on discussing any nail removal today.  She says that her left forefoot pain is doing better now that she is takes the Mobic and wears the pad on her foot.  She says that her husband applies the pad to her foot for her.  She states that her foot is doing better wearing the head but she still experiences pain and discomfort.  She presents the office today for continued evaluation and treatment of her painful left forefoot.  Vascular  Dorsalis pedis and posterior tibial pulses are palpable  B/L.  Capillary return  WNL.  Temperature gradient is  WNL.  Skin turgor  WNL  Sensorium  Senn Weinstein monofilament wire  WNL. Normal tactile sensation.  Nail Exam  Patient has normal nails with no evidence of bacterial or fungal infection.Nail dystrophy left hallux nail plate.  Orthopedic  Exam  Muscle tone and muscle strength  WNL.  No limitations of motion feet  B/L.  No crepitus or joint effusion noted.  Foot type is unremarkable and digits show no abnormalities.  HAV 1st MPJ left foot.  Palpable pain sub 2 left forefoot.  Capsulitis left forefoot.  ROV.  Injection therapy.  Injection therapy using 1.0 cc. Of 2% xylocaine( 20 mg.) plus 1 cc. of kenalog-la ( 10 mg) plus 1/2 cc. of dexamethazone phosphate ( 2 mg).  Continue taking Mobic as needed.  Continue with padding.  To discuss visit with pedorthist next visit in 3 weeks.  Gardiner Barefoot DPM

## 2019-04-15 ENCOUNTER — Ambulatory Visit: Payer: Medicare Other | Admitting: Podiatry

## 2019-04-29 ENCOUNTER — Ambulatory Visit (INDEPENDENT_AMBULATORY_CARE_PROVIDER_SITE_OTHER): Payer: Medicare Other | Admitting: Podiatry

## 2019-04-29 ENCOUNTER — Encounter: Payer: Self-pay | Admitting: Podiatry

## 2019-04-29 ENCOUNTER — Other Ambulatory Visit: Payer: Self-pay

## 2019-04-29 DIAGNOSIS — W450XXS Nail entering through skin, sequela: Secondary | ICD-10-CM | POA: Diagnosis not present

## 2019-04-29 DIAGNOSIS — M7752 Other enthesopathy of left foot: Secondary | ICD-10-CM

## 2019-04-29 NOTE — Progress Notes (Signed)
This patient returns to the office follow-up for diagnosis of a capsulitis subtwo left foot.  She was treated with injection therapy and given Mobic to take as needed.  She says the padding has also been very beneficial.  She says that she has been doing great following her previous visit.  She says the injection and the padding have been great and there was no need for her to take Mobic.  She presents the office today follow-up for her capsulitis diagnosis.  She also says she has a very thick disfigured discolored big toenail on her left foot that she would appreciate that it was treated.  She presents the office for continued treatment of her feet.  Vascular  Dorsalis pedis and posterior tibial pulses are palpable  B/L.  Capillary return  WNL.  Temperature gradient is  WNL.  Skin turgor  WNL  Sensorium  Senn Weinstein monofilament wire  WNL. Normal tactile sensation.  Nail Exam  Patient has normal nails with no evidence of bacterial or fungal infection.  Orthopedic  Exam  Muscle tone and muscle strength  WNL.  No limitations of motion feet  B/L.  No crepitus or joint effusion noted.  Foot type is unremarkable and digits show no abnormalities.  HAV 1st MPJ  Left foot.  No pain upon palpation sub 2 left foot.  Skin  No open lesions.  Normal skin texture and turgor.  S/P capsilitis sub 2 left.  Nail dystrophy left hallux.  ROV.  Discussed condition with patient.  Gave her additional padding since she does not want to get insoles from [facility] yet.  Debride left hallux nail.  RTC prn.   Gardiner Barefoot DPM

## 2019-05-01 DIAGNOSIS — L820 Inflamed seborrheic keratosis: Secondary | ICD-10-CM | POA: Diagnosis not present

## 2019-05-01 DIAGNOSIS — L578 Other skin changes due to chronic exposure to nonionizing radiation: Secondary | ICD-10-CM | POA: Diagnosis not present

## 2019-05-01 DIAGNOSIS — L821 Other seborrheic keratosis: Secondary | ICD-10-CM | POA: Diagnosis not present

## 2019-07-22 ENCOUNTER — Other Ambulatory Visit: Payer: Self-pay

## 2019-07-22 ENCOUNTER — Encounter: Payer: Self-pay | Admitting: Podiatry

## 2019-07-22 ENCOUNTER — Ambulatory Visit (INDEPENDENT_AMBULATORY_CARE_PROVIDER_SITE_OTHER): Payer: Medicare Other | Admitting: Podiatry

## 2019-07-22 DIAGNOSIS — M7752 Other enthesopathy of left foot: Secondary | ICD-10-CM

## 2019-07-22 NOTE — Progress Notes (Signed)
This patient presents the office for continued evaluation and treatment of a diagnosis of a capsulitis sub two left foot.  She says that she was treated previously with injection therapy and it has helped for the last few months and the pain is  just now starting to return.  She says that she is interested for permanent insert to take pressure off of this painful area on her left forefoot.  She also has been taking Mobic and she requests a refill for this medication.  She presents the office today for continued evaluation and treatment.  Vascular  Dorsalis pedis and posterior tibial pulses are palpable  B/L.  Capillary return  WNL.  Temperature gradient is  WNL.  Skin turgor  WNL  Sensorium  Senn Weinstein monofilament wire  WNL. Normal tactile sensation.  Nail Exam  Patient has normal nails with no evidence of bacterial or fungal infection.  Thickened left hallux toenail.  Orthopedic  Exam  Muscle tone and muscle strength  WNL.  No limitations of motion feet  B/L.  No crepitus or joint effusion noted.  Foot type is unremarkable and digits show no abnormalities.  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  Palpable pain noted sub two of the left foot.  Skin  No open lesions.  Normal skin texture and turgor.  Capsulitis 2nd  MPJ left foot.  HAV  B/L.  ROV.  Discussed this condition with this patient.  She was told to make an appointment with [facility] to get insoles with a cut out subtwo.  Patient is not interested in injection therapy today.  Patient requests a Mobic refill.  Padding was dispensed to this patient for temporary relief.  Return to clinic to see Liliane Channel.   Gardiner Barefoot DPM

## 2019-07-24 ENCOUNTER — Ambulatory Visit: Payer: Medicare Other | Admitting: Internal Medicine

## 2019-07-25 ENCOUNTER — Ambulatory Visit: Payer: Medicare Other | Admitting: Internal Medicine

## 2019-08-01 ENCOUNTER — Telehealth: Payer: Self-pay | Admitting: Family Medicine

## 2019-08-01 DIAGNOSIS — E7800 Pure hypercholesterolemia, unspecified: Secondary | ICD-10-CM

## 2019-08-01 DIAGNOSIS — E559 Vitamin D deficiency, unspecified: Secondary | ICD-10-CM

## 2019-08-01 NOTE — Telephone Encounter (Signed)
-----   Message from Cloyd Stagers, RT sent at 07/23/2019  1:35 PM EST ----- Regarding: Lab Orders for Friday 2.19.2021 Please place lab orders for Friday 2.19.2021 , office visit for physical on Friday 2.26.2021 Thank you, Dyke Maes RT(R)

## 2019-08-02 ENCOUNTER — Other Ambulatory Visit: Payer: Medicare Other

## 2019-08-02 ENCOUNTER — Other Ambulatory Visit: Payer: Self-pay

## 2019-08-02 ENCOUNTER — Ambulatory Visit (INDEPENDENT_AMBULATORY_CARE_PROVIDER_SITE_OTHER): Payer: Medicare Other

## 2019-08-02 ENCOUNTER — Ambulatory Visit: Payer: Medicare Other

## 2019-08-02 VITALS — Wt 170.0 lb

## 2019-08-02 DIAGNOSIS — Z0000 Encounter for general adult medical examination without abnormal findings: Secondary | ICD-10-CM | POA: Diagnosis not present

## 2019-08-02 NOTE — Progress Notes (Signed)
Subjective:   Patty Shelton is a 79 y.o. female who presents for Medicare Annual (Subsequent) preventive examination.  Review of Systems: N/A   This visit is being conducted through telemedicine via telephone at the nurse health advisor's home address due to the COVID-19 pandemic. This patient has given me verbal consent via doximity to conduct this visit, patient states they are participating from their home address. Patient and myself are on the telephone call. There is no referral for this visit. Some vital signs may be absent or patient reported.    Patient identification: identified by name, DOB, and current address   Cardiac Risk Factors include: advanced age (>84men, >21 women);hypertension     Objective:     Vitals: Wt 170 lb (77.1 kg)   BMI 28.29 kg/m   Body mass index is 28.29 kg/m.  Advanced Directives 08/02/2019 07/26/2018 07/18/2017 11/27/2016 05/19/2016  Does Patient Have a Medical Advance Directive? No No No No No  Would patient like information on creating a medical advance directive? No - Patient declined No - Patient declined No - Patient declined Yes (ED - Information included in AVS) -    Tobacco Social History   Tobacco Use  Smoking Status Never Smoker  Smokeless Tobacco Never Used     Counseling given: Not Answered   Clinical Intake:  Pre-visit preparation completed: Yes  Pain : No/denies pain     Nutritional Risks: None Diabetes: No  How often do you need to have someone help you when you read instructions, pamphlets, or other written materials from your doctor or pharmacy?: 1 - Never What is the last grade level you completed in school?: 12th  Interpreter Needed?: No  Information entered by :: CJohnson, LPN  Past Medical History:  Diagnosis Date  . Hyperlipemia   . Hypertension    Past Surgical History:  Procedure Laterality Date  . CATARACT EXTRACTION    . RETINAL DETACHMENT SURGERY     Family History  Problem Relation Age of  Onset  . Heart attack Mother   . Stroke Father   . Hyperlipidemia Father   . Dementia Father    Social History   Socioeconomic History  . Marital status: Married    Spouse name: Not on file  . Number of children: Not on file  . Years of education: Not on file  . Highest education level: Not on file  Occupational History  . Not on file  Tobacco Use  . Smoking status: Never Smoker  . Smokeless tobacco: Never Used  Substance and Sexual Activity  . Alcohol use: No    Alcohol/week: 0.0 standard drinks  . Drug use: No  . Sexual activity: Yes  Other Topics Concern  . Not on file  Social History Narrative   No living will , no HCPOA. Full code (reviewed 2015, given packet)   Social Determinants of Health   Financial Resource Strain: Low Risk   . Difficulty of Paying Living Expenses: Not hard at all  Food Insecurity: No Food Insecurity  . Worried About Charity fundraiser in the Last Year: Never true  . Ran Out of Food in the Last Year: Never true  Transportation Needs: No Transportation Needs  . Lack of Transportation (Medical): No  . Lack of Transportation (Non-Medical): No  Physical Activity: Inactive  . Days of Exercise per Week: 0 days  . Minutes of Exercise per Session: 0 min  Stress: No Stress Concern Present  . Feeling of Stress :  Not at all  Social Connections:   . Frequency of Communication with Friends and Family: Not on file  . Frequency of Social Gatherings with Friends and Family: Not on file  . Attends Religious Services: Not on file  . Active Member of Clubs or Organizations: Not on file  . Attends Archivist Meetings: Not on file  . Marital Status: Not on file    Outpatient Encounter Medications as of 08/02/2019  Medication Sig  . acetaminophen (TYLENOL) 500 MG tablet Take 500 mg by mouth every 6 (six) hours as needed.  Marland Kitchen albuterol (PROVENTIL HFA;VENTOLIN HFA) 108 (90 Base) MCG/ACT inhaler Inhale 2 puffs into the lungs every 6 (six) hours as  needed for wheezing or shortness of breath.  . ALPRAZolam (XANAX) 0.25 MG tablet Take 1 tablet (0.25 mg total) by mouth daily as needed for anxiety.  Marland Kitchen amLODipine (NORVASC) 5 MG tablet Take 1 tablet (5 mg total) by mouth daily.  . Biotin 10 MG TABS Take 1 tablet by mouth daily.  . Calcium Carbonate-Vitamin D (CALCIUM 600+D) 600-400 MG-UNIT per tablet Take 1 tablet by mouth daily.  . cetirizine (ZYRTEC) 10 MG tablet Take 10 mg by mouth daily.   Marland Kitchen CRANBERRY PO Take 1 capsule by mouth daily.  . diclofenac sodium (VOLTAREN) 1 % GEL Apply 4 g topically 4 (four) times daily as needed.  . famciclovir (FAMVIR) 250 MG tablet Take 1 tablet (250 mg total) by mouth daily.  . fluticasone (FLONASE) 50 MCG/ACT nasal spray Place 2 sprays into both nostrils as needed.   . Fluticasone Furoate (ARNUITY ELLIPTA) 100 MCG/ACT AEPB Inhale 1 puff into the lungs daily.  . meloxicam (MOBIC) 15 MG tablet Take 1 tablet (15 mg total) by mouth daily.  . mupirocin nasal ointment (BACTROBAN) 2 % Use small amount in each nostril twice daily for five (5) days. After application, press sides of nose together and gently massage.  . pantoprazole (PROTONIX) 40 MG tablet Take 1 tablet (40 mg total) by mouth daily.  . pravastatin (PRAVACHOL) 40 MG tablet Take 1 tablet (40 mg total) by mouth daily.  Marland Kitchen Respiratory Therapy Supplies (FLUTTER) DEVI 1 Device by Does not apply route as directed.  . traMADol (ULTRAM) 50 MG tablet Take 1 tablet (50 mg total) by mouth every 8 (eight) hours as needed.   No facility-administered encounter medications on file as of 08/02/2019.    Activities of Daily Living In your present state of health, do you have any difficulty performing the following activities: 08/02/2019  Hearing? Y  Comment some hearing loss noted  Vision? N  Difficulty concentrating or making decisions? N  Walking or climbing stairs? N  Dressing or bathing? N  Doing errands, shopping? N  Preparing Food and eating ? N  Using the  Toilet? N  In the past six months, have you accidently leaked urine? Y  Comment wears pads  Do you have problems with loss of bowel control? N  Managing your Medications? N  Managing your Finances? N  Housekeeping or managing your Housekeeping? N  Some recent data might be hidden    Patient Care Team: Jinny Sanders, MD as PCP - General Copland, Frederico Hamman, MD as Consulting Physician (Sports Medicine) Ralene Bathe, MD as Consulting Physician (Dermatology) Calvert Cantor, MD as Consulting Physician (Ophthalmology) Rodney Langton., MD as Consulting Physician (Dentistry) Vidal Schwalbe Yvetta Coder, FNP as Nurse Practitioner (Family Medicine)    Assessment:   This is a routine wellness examination for [place].  Exercise Activities and Dietary recommendations Current Exercise Habits: The patient does not participate in regular exercise at present, Exercise limited by: None identified  Goals    . Follow up with Primary Care Provider     Starting 07/26/2018, I will continue to take medications as prescribed and to keep appointments with PCP as scheduled.     . Patient Stated     08/02/2019, I will maintain and continue medications as prescribed.        Fall Risk Fall Risk  08/02/2019 07/26/2018 07/18/2017 05/19/2016 05/19/2016  Falls in the past year? 0 1 No Yes No  Comment - fell; multiple fractures to left foot - [facility] Telephone Survey: data to providers prior to load -  Number falls in past yr: 0 0 - 1 -  Comment - - - [facility] Telephone Survey Actual Response = 1 -  Injury with Fall? 0 1 - No -  Risk for fall due to : Medication side effect - - - -  Follow up Falls evaluation completed;Falls prevention discussed - - - -   Is the patient's home free of loose throw rugs in walkways, pet beds, electrical cords, etc?   yes      Grab bars in the bathroom? no      Handrails on the stairs?   yes      Adequate lighting?   yes  Timed Get Up and Go performed: N/A  Depression Screen PHQ 2/9  Scores 08/02/2019 07/26/2018 07/18/2017 05/19/2016  PHQ - 2 Score 0 0 0 0  PHQ- 9 Score 0 0 0 -     Cognitive Function MMSE - Mini Mental State Exam 08/02/2019 07/26/2018 07/18/2017 05/19/2016  Orientation to time 5 5 5 5   Orientation to Place 5 5 5 5   Registration 3 3 3 3   Attention/ Calculation 5 0 0 0  Recall 3 3 3 3   Language- name 2 objects - 0 0 0  Language- repeat 1 1 1 1   Language- follow 3 step command - 3 3 3   Language- read & follow direction - 0 0 0  Write a sentence - 0 0 0  Copy design - 0 0 0  Total score - 20 20 20   Mini Cog  Mini-Cog screen was completed. Maximum score is 22. A value of 0 denotes this part of the MMSE was not completed or the patient failed this part of the Mini-Cog screening.       Immunization History  Administered Date(s) Administered  . Fluad Quad(high Dose 65+) 03/14/2019  . Influenza Split 05/24/2011, 03/01/2013, 04/24/2015  . Influenza Whole 03/27/2009  . Influenza, High Dose Seasonal PF 02/29/2016, 04/07/2017, 05/24/2018  . Influenza,inj,Quad PF,6+ Mos 04/03/2014  . Pneumococcal Conjugate-13 04/03/2014, 04/07/2017  . Pneumococcal Polysaccharide-23 03/27/2009  . Tdap 03/11/2011  . Zoster 06/02/2014    Qualifies for Shingles Vaccine?Yes   Screening Tests Health Maintenance  Topic Date Due  . MAMMOGRAM  08/01/2020 (Originally 05/02/2019)  . TETANUS/TDAP  03/10/2021  . INFLUENZA VACCINE  Completed  . DEXA SCAN  Completed  . PNA vac Low Risk Adult  Completed    Cancer Screenings: Lung: Low Dose CT Chest recommended if Age 35-80 years, 30 pack-year currently smoking OR have quit w/in 15 years. Patient does not qualify. Breast:  Up to date on Mammogram? No, declined at this time.   Bone Density/Dexa: completed 03/22/2011 Colorectal: Cologuard completed 08/03/2016  Additional Screenings:  Hepatitis C Screening: N/A     Plan:  Patient will maintain and continue medications as prescribed.    I have personally reviewed and noted the  following in the patient's chart:   . Medical and social history . Use of alcohol, tobacco or illicit drugs  . Current medications and supplements . Functional ability and status . Nutritional status . Physical activity . Advanced directives . List of other physicians . Hospitalizations, surgeries, and ER visits in previous 12 months . Vitals . Screenings to include cognitive, depression, and falls . Referrals and appointments  In addition, I have reviewed and discussed with patient certain preventive protocols, quality metrics, and best practice recommendations. A written personalized care plan for preventive services as well as general preventive health recommendations were provided to patient.     Andrez Grime, LPN  [id number]

## 2019-08-02 NOTE — Patient Instructions (Signed)
[name] , Thank you for taking time to come for your Medicare Wellness Visit. I appreciate your ongoing commitment to your health goals. Please review the following plan we discussed and let me know if I can assist you in the future.   Screening recommendations/referrals: Colonoscopy: Cologuard completed 08/03/2016 Mammogram: declined Bone Density: completed 03/22/2011 Recommended yearly ophthalmology/optometry visit for glaucoma screening and checkup Recommended yearly dental visit for hygiene and checkup  Vaccinations: Influenza vaccine: Up to date, completed 03/14/2019 Pneumococcal vaccine: Completed series Tdap vaccine: Up to date, completed 03/11/2011 Shingles vaccine: discussed    Advanced directives: Advance directive discussed with you today. Even though you declined this today please call our office should you change your mind and we can give you the proper paperwork for you to fill out.  Conditions/risks identified: hypertension  Next appointment: 08/09/2019 @ 10 am    Preventive Care 65 Years and Older, Female Preventive care refers to lifestyle choices and visits with your health care provider that can promote health and wellness. What does preventive care include?  A yearly physical exam. This is also called an annual well check.  Dental exams once or twice a year.  Routine eye exams. Ask your health care provider how often you should have your eyes checked.  Personal lifestyle choices, including:  Daily care of your teeth and gums.  Regular physical activity.  Eating a healthy diet.  Avoiding tobacco and drug use.  Limiting alcohol use.  Practicing safe sex.  Taking low-dose aspirin every day.  Taking vitamin and mineral supplements as recommended by your health care provider. What happens during an annual well check? The services and screenings done by your health care provider during your annual well check will depend on your age, overall health,  lifestyle risk factors, and family history of disease. Counseling  Your health care provider may ask you questions about your:  Alcohol use.  Tobacco use.  Drug use.  Emotional well-being.  Home and relationship well-being.  Sexual activity.  Eating habits.  History of falls.  Memory and ability to understand (cognition).  Work and work Statistician.  Reproductive health. Screening  You may have the following tests or measurements:  Height, weight, and BMI.  Blood pressure.  Lipid and cholesterol levels. These may be checked every 5 years, or more frequently if you are over 60 years old.  Skin check.  Lung cancer screening. You may have this screening every year starting at age 62 if you have a 30-pack-year history of smoking and currently smoke or have quit within the past 15 years.  Fecal occult blood test (FOBT) of the stool. You may have this test every year starting at age 47.  Flexible sigmoidoscopy or colonoscopy. You may have a sigmoidoscopy every 5 years or a colonoscopy every 10 years starting at age 54.  Hepatitis C blood test.  Hepatitis B blood test.  Sexually transmitted disease (STD) testing.  Diabetes screening. This is done by checking your blood sugar (glucose) after you have not eaten for a while (fasting). You may have this done every 1-3 years.  Bone density scan. This is done to screen for osteoporosis. You may have this done starting at age 6.  Mammogram. This may be done every 1-2 years. Talk to your health care provider about how often you should have regular mammograms. Talk with your health care provider about your test results, treatment options, and if necessary, the need for more tests. Vaccines  Your health care provider  may recommend certain vaccines, such as:  Influenza vaccine. This is recommended every year.  Tetanus, diphtheria, and acellular pertussis (Tdap, Td) vaccine. You may need a Td booster every 10 years.  Zoster  vaccine. You may need this after age 57.  Pneumococcal 13-valent conjugate (PCV13) vaccine. One dose is recommended after age 55.  Pneumococcal polysaccharide (PPSV23) vaccine. One dose is recommended after age 56. Talk to your health care provider about which screenings and vaccines you need and how often you need them. This information is not intended to replace advice given to you by your health care provider. Make sure you discuss any questions you have with your health care provider. Document Released: 06/26/2015 Document Revised: 02/17/2016 Document Reviewed: 03/31/2015 Elsevier Interactive Patient Education  2017 [employer] Prevention in the Home Falls can cause injuries. They can happen to people of all ages. There are many things you can do to make your home safe and to help prevent falls. What can I do on the outside of my home?  Regularly fix the edges of walkways and driveways and fix any cracks.  Remove anything that might make you trip as you walk through a door, such as a raised step or threshold.  Trim any bushes or trees on the path to your home.  Use bright outdoor lighting.  Clear any walking paths of anything that might make someone trip, such as rocks or tools.  Regularly check to see if handrails are loose or broken. Make sure that both sides of any steps have handrails.  Any raised decks and porches should have guardrails on the edges.  Have any leaves, snow, or ice cleared regularly.  Use sand or salt on walking paths during winter.  Clean up any spills in your garage right away. This includes oil or grease spills. What can I do in the bathroom?  Use night lights.  Install grab bars by the toilet and in the tub and shower. Do not use towel bars as grab bars.  Use non-skid mats or decals in the tub or shower.  If you need to sit down in the shower, use a plastic, non-slip stool.  Keep the floor dry. Clean up any water that spills on the  floor as soon as it happens.  Remove soap buildup in the tub or shower regularly.  Attach bath mats securely with double-sided non-slip rug tape.  Do not have throw rugs and other things on the floor that can make you trip. What can I do in the bedroom?  Use night lights.  Make sure that you have a light by your bed that is easy to reach.  Do not use any sheets or blankets that are too big for your bed. They should not hang down onto the floor.  Have a firm chair that has side arms. You can use this for support while you get dressed.  Do not have throw rugs and other things on the floor that can make you trip. What can I do in the kitchen?  Clean up any spills right away.  Avoid walking on wet floors.  Keep items that you use a lot in easy-to-reach places.  If you need to reach something above you, use a strong step stool that has a grab bar.  Keep electrical cords out of the way.  Do not use floor polish or wax that makes floors slippery. If you must use wax, use non-skid floor wax.  Do not have throw rugs  and other things on the floor that can make you trip. What can I do with my stairs?  Do not leave any items on the stairs.  Make sure that there are handrails on both sides of the stairs and use them. Fix handrails that are broken or loose. Make sure that handrails are as long as the stairways.  Check any carpeting to make sure that it is firmly attached to the stairs. Fix any carpet that is loose or worn.  Avoid having throw rugs at the top or bottom of the stairs. If you do have throw rugs, attach them to the floor with carpet tape.  Make sure that you have a light switch at the top of the stairs and the bottom of the stairs. If you do not have them, ask someone to add them for you. What else can I do to help prevent falls?  Wear shoes that:  Do not have high heels.  Have rubber bottoms.  Are comfortable and fit you well.  Are closed at the toe. Do not wear  sandals.  If you use a stepladder:  Make sure that it is fully opened. Do not climb a closed stepladder.  Make sure that both sides of the stepladder are locked into place.  Ask someone to hold it for you, if possible.  Clearly mark and make sure that you can see:  Any grab bars or handrails.  First and last steps.  Where the edge of each step is.  Use tools that help you move around (mobility aids) if they are needed. These include:  Canes.  Walkers.  Scooters.  Crutches.  Turn on the lights when you go into a dark area. Replace any light bulbs as soon as they burn out.  Set up your furniture so you have a clear path. Avoid moving your furniture around.  If any of your floors are uneven, fix them.  If there are any pets around you, be aware of where they are.  Review your medicines with your doctor. Some medicines can make you feel dizzy. This can increase your chance of falling. Ask your doctor what other things that you can do to help prevent falls. This information is not intended to replace advice given to you by your health care provider. Make sure you discuss any questions you have with your health care provider. Document Released: 03/26/2009 Document Revised: 11/05/2015 Document Reviewed: 07/04/2014 Elsevier Interactive Patient Education  2017 [employer].

## 2019-08-02 NOTE — Progress Notes (Signed)
PCP notes:  Health Maintenance: Mammogram- declined at this time   Abnormal Screenings: none   Patient concerns: Soreness under breast area onset 3 weeks ago.    Nurse concerns: none   Next PCP appt.: 08/09/2019 @ 10 am

## 2019-08-05 ENCOUNTER — Other Ambulatory Visit: Payer: Self-pay

## 2019-08-05 ENCOUNTER — Other Ambulatory Visit (INDEPENDENT_AMBULATORY_CARE_PROVIDER_SITE_OTHER): Payer: Medicare Other

## 2019-08-05 DIAGNOSIS — E7800 Pure hypercholesterolemia, unspecified: Secondary | ICD-10-CM | POA: Diagnosis not present

## 2019-08-05 DIAGNOSIS — E559 Vitamin D deficiency, unspecified: Secondary | ICD-10-CM

## 2019-08-05 LAB — COMPREHENSIVE METABOLIC PANEL
ALT: 13 U/L (ref 0–35)
AST: 15 U/L (ref 0–37)
Albumin: 4.4 g/dL (ref 3.5–5.2)
Alkaline Phosphatase: 74 U/L (ref 39–117)
BUN: 20 mg/dL (ref 6–23)
CO2: 26 mEq/L (ref 19–32)
Calcium: 9.6 mg/dL (ref 8.4–10.5)
Chloride: 103 mEq/L (ref 96–112)
Creatinine, Ser: 1.1 mg/dL (ref 0.40–1.20)
GFR: 47.89 mL/min — ABNORMAL LOW (ref >60.00)
Glucose, Bld: 102 mg/dL — ABNORMAL HIGH (ref 70–99)
Potassium: 4.4 mEq/L (ref 3.5–5.1)
Sodium: 142 mEq/L (ref 135–145)
Total Bilirubin: 0.4 mg/dL (ref 0.2–1.2)
Total Protein: 7.3 g/dL (ref 6.0–8.3)

## 2019-08-05 LAB — LIPID PANEL
Cholesterol: 233 mg/dL — ABNORMAL HIGH (ref 0–200)
HDL: 55.6 mg/dL (ref >39.00)
LDL Cholesterol: 159 mg/dL — ABNORMAL HIGH (ref 0–99)
NonHDL: 177.76
Total CHOL/HDL Ratio: 4
Triglycerides: 94 mg/dL (ref 0.0–149.0)
VLDL: 18.8 mg/dL (ref 0.0–40.0)

## 2019-08-05 LAB — VITAMIN D 25 HYDROXY (VIT D DEFICIENCY, FRACTURES): VITD: 33.43 ng/mL (ref 30.00–100.00)

## 2019-08-06 ENCOUNTER — Other Ambulatory Visit: Payer: Self-pay

## 2019-08-06 ENCOUNTER — Ambulatory Visit (INDEPENDENT_AMBULATORY_CARE_PROVIDER_SITE_OTHER): Payer: Medicare Other | Admitting: Orthotics

## 2019-08-06 DIAGNOSIS — M2012 Hallux valgus (acquired), left foot: Secondary | ICD-10-CM

## 2019-08-06 DIAGNOSIS — M7752 Other enthesopathy of left foot: Secondary | ICD-10-CM | POA: Diagnosis not present

## 2019-08-06 DIAGNOSIS — S92335A Nondisplaced fracture of third metatarsal bone, left foot, initial encounter for closed fracture: Secondary | ICD-10-CM

## 2019-08-06 NOTE — Progress Notes (Signed)
Patient came into today to be cast for [employer] Orthotics. Upon recommendation of Dr. Prudence Davidson Patient presents with capsulitis MTP left Goals are offload ff Plan vendor [place]

## 2019-08-06 NOTE — Progress Notes (Signed)
No critical labs need to be addressed urgently. We will discuss labs in detail at upcoming office visit.   

## 2019-08-09 ENCOUNTER — Ambulatory Visit (INDEPENDENT_AMBULATORY_CARE_PROVIDER_SITE_OTHER): Payer: Medicare Other | Admitting: Family Medicine

## 2019-08-09 ENCOUNTER — Encounter: Payer: Self-pay | Admitting: Family Medicine

## 2019-08-09 ENCOUNTER — Other Ambulatory Visit: Payer: Self-pay

## 2019-08-09 VITALS — BP 146/84 | HR 80 | Temp 98.0°F | Ht 65.5 in | Wt 175.0 lb

## 2019-08-09 DIAGNOSIS — I2510 Atherosclerotic heart disease of native coronary artery without angina pectoris: Secondary | ICD-10-CM | POA: Diagnosis not present

## 2019-08-09 DIAGNOSIS — E7800 Pure hypercholesterolemia, unspecified: Secondary | ICD-10-CM | POA: Diagnosis not present

## 2019-08-09 DIAGNOSIS — E2839 Other primary ovarian failure: Secondary | ICD-10-CM | POA: Diagnosis not present

## 2019-08-09 DIAGNOSIS — Z1231 Encounter for screening mammogram for malignant neoplasm of breast: Secondary | ICD-10-CM

## 2019-08-09 DIAGNOSIS — I10 Essential (primary) hypertension: Secondary | ICD-10-CM | POA: Diagnosis not present

## 2019-08-09 DIAGNOSIS — K219 Gastro-esophageal reflux disease without esophagitis: Secondary | ICD-10-CM

## 2019-08-09 DIAGNOSIS — M8580 Other specified disorders of bone density and structure, unspecified site: Secondary | ICD-10-CM | POA: Diagnosis not present

## 2019-08-09 DIAGNOSIS — N1831 Chronic kidney disease, stage 3a: Secondary | ICD-10-CM | POA: Diagnosis not present

## 2019-08-09 MED ORDER — ROSUVASTATIN CALCIUM 10 MG PO TABS: 10.0000 mg | ORAL_TABLET | Freq: Every day | ORAL | 11 refills | Status: DC

## 2019-08-09 MED ORDER — FAMCICLOVIR 250 MG PO TABS: 250.0000 mg | ORAL_TABLET | Freq: Every day | ORAL | 3 refills | Status: DC

## 2019-08-09 NOTE — Assessment & Plan Note (Signed)
Asymptomatic. Needs more aggressive cholesterol treatment.

## 2019-08-09 NOTE — Patient Instructions (Addendum)
If not taking pantoprazole.. consider daily low dose prilosec 20 mg daily if reflux continue.  Decrease animal fats, decrease cholesterol in diet.   Schedule labs only OV in 3 months to check cholesterol on new crestor low dose.  You can schedule mammogram and bone density, but we will send order to them.

## 2019-08-09 NOTE — Assessment & Plan Note (Signed)
Poor control off pravastatin.  SE to atorvastatin in past. Goal LDL now < 70 give coronary atherosclerosis seen on Chest CT.  Start crestor 10 mg and increase as tolerated... re-eval in 3 months.

## 2019-08-09 NOTE — Assessment & Plan Note (Signed)
Stable control. 

## 2019-08-09 NOTE — Progress Notes (Signed)
Chief Complaint  Patient presents with  . Part 2 Annual    History of Present Illness: HPI   The patient presents for review of chronic health problems. He/She also has the following acute concerns today:  The patient saw a LPN or RN for medicare wellness visit.  Prevention and wellness was reviewed in detail. Note reviewed and important notes copied below. PCP notes: Health Maintenance: Mammogram- declined at this time Abnormal Screenings: none  08/09/19 Hypertension:   Improved control on amlodipine, HCT Goal < 150/90  BP Readings from Last 3 Encounters:  08/09/19 (!) 146/84  02/21/19 (!) 161/76  12/20/18 140/68  Using medication without problems or lightheadedness:  none Chest pain with exertion:none Edema:none Short of breath:  None.. 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  Using flonase and Zyrtec daily. occ using Arnuity.  Average home BPs:not checking Other issues:  Elevated Cholesterol:  Worsened control,  LDL not at goal .. she has stopped this medication 2 months ago. COronasry Atherosclerosis on CT chest... LDL goal < 70. Lab Results  Component Value Date   CHOL 233 (H) 08/05/2019   HDL 55.60 08/05/2019   LDLCALC 159 (H) 08/05/2019   LDLDIRECT 180.6 04/04/2011   TRIG 94.0 08/05/2019   CHOLHDL 4 08/05/2019  Using medications without problems: Muscle aches:  Diet compliance: moderate Exercise: none Other complaints:  CKD: Stable GFR  Vivt D at goal.  This visit occurred during the SARS-CoV-2 public health emergency.  Safety protocols were in place, including screening questions prior to the visit, additional usage of staff PPE, and extensive cleaning of exam room while observing appropriate contact time as indicated for disinfecting solutions.   COVID 19 screen:  No recent travel or known exposure to COVID19 The patient denies respiratory symptoms of COVID 19 at this time. The importance of social distancing was discussed today.      Review of Systems  Constitutional: Negative for chills and fever.  HENT: Negative for congestion and ear pain.   Eyes: Negative for pain and redness.  Respiratory: Negative for cough and shortness of breath.   Cardiovascular: Negative for chest pain, palpitations and leg swelling.  Gastrointestinal: Negative for abdominal pain, blood in stool, constipation, diarrhea, nausea and vomiting.  Genitourinary: Negative for dysuria.  Musculoskeletal: Negative for falls and myalgias.  Skin: Negative for rash.  Neurological: Negative for dizziness.  Psychiatric/Behavioral: Negative for depression. The patient is not nervous/anxious.     occ over right lower rib CAGE WHEN ROLLING OVER IN BED.   Past Medical History:  Diagnosis Date  . Hyperlipemia   . Hypertension     reports that she has never smoked. She has never used smokeless tobacco. She reports that she does not drink alcohol or use drugs.   Current Outpatient Medications:  .  acetaminophen (TYLENOL) 500 MG tablet, Take 500 mg by mouth every 6 (six) hours as needed., Disp: , Rfl:  .  albuterol (PROVENTIL HFA;VENTOLIN HFA) 108 (90 Base) MCG/ACT inhaler, Inhale 2 puffs into the lungs every 6 (six) hours as needed for wheezing or shortness of breath., Disp: 1 Inhaler, Rfl: 2 .  ALPRAZolam (XANAX) 0.25 MG tablet, Take 1 tablet (0.25 mg total) by mouth daily as needed for anxiety., Disp: 20 tablet, Rfl: 0 .  Biotin 10 MG TABS, Take 1 tablet by mouth daily., Disp: , Rfl:  .  Calcium Carbonate-Vitamin D (CALCIUM 600+D) 600-400 MG-UNIT per tablet, Take 1 tablet by mouth daily., Disp: , Rfl:  .  cetirizine (ZYRTEC) 10 MG tablet, Take 10 mg by mouth daily. , Disp: , Rfl:  .  CRANBERRY PO, Take 1 capsule by mouth daily., Disp: , Rfl:  .  diclofenac sodium (VOLTAREN) 1 % GEL, Apply 4 g topically 4 (four) times daily as needed., Disp: , Rfl:  .  famciclovir (FAMVIR) 250 MG tablet, Take 1 tablet (250 mg total) by mouth daily., Disp: 90 tablet,  Rfl: 3 .  fluticasone (FLONASE) 50 MCG/ACT nasal spray, Place 2 sprays into both nostrils as needed. , Disp: , Rfl:  .  Fluticasone Furoate (ARNUITY ELLIPTA) 100 MCG/ACT AEPB, Inhale 1 puff into the lungs daily., Disp: 90 each, Rfl: 0 .  mupirocin nasal ointment (BACTROBAN) 2 %, Use small amount in each nostril twice daily for five (5) days. After application, press sides of nose together and gently massage., Disp: 10 g, Rfl: 0 .  pravastatin (PRAVACHOL) 40 MG tablet, Take 1 tablet (40 mg total) by mouth daily., Disp: 90 tablet, Rfl: 3 .  Respiratory Therapy Supplies (FLUTTER) DEVI, 1 Device by Does not apply route as directed., Disp: 1 each, Rfl: 0 .  traMADol (ULTRAM) 50 MG tablet, Take 1 tablet (50 mg total) by mouth every 8 (eight) hours as needed., Disp: 15 tablet, Rfl: 0 .  amLODipine (NORVASC) 5 MG tablet, Take 1 tablet (5 mg total) by mouth daily. (Patient not taking: Reported on 08/09/2019), Disp: 30 tablet, Rfl: 3 .  meloxicam (MOBIC) 15 MG tablet, Take 15 mg by mouth daily., Disp: , Rfl:  .  pantoprazole (PROTONIX) 40 MG tablet, Take 1 tablet (40 mg total) by mouth daily. (Patient not taking: Reported on 08/09/2019), Disp: 30 tablet, Rfl: 2   Observations/Objective: Blood pressure (!) 146/84, pulse 80, temperature 98 F (36.7 C), temperature source Temporal, height 5' 5.5" (1.664 m), weight 175 lb (79.4 kg), SpO2 98 %.  Physical Exam Constitutional:      General: She is not in acute distress.    Appearance: Normal appearance. She is well-developed. She is not ill-appearing or toxic-appearing.  HENT:     Head: Normocephalic.     Right Ear: Hearing, tympanic membrane, ear canal and external ear normal.     Left Ear: Hearing, tympanic membrane, ear canal and external ear normal.     Nose: Nose normal.  Eyes:     General: Lids are normal. Lids are everted, no foreign bodies appreciated.     Conjunctiva/sclera: Conjunctivae normal.     Pupils: Pupils are equal, round, and reactive to  light.  Neck:     Thyroid: No thyroid mass or thyromegaly.     Vascular: No carotid bruit.     Trachea: Trachea normal.  Cardiovascular:     Rate and Rhythm: Normal rate and regular rhythm.     Heart sounds: Normal heart sounds, S1 normal and S2 normal. No murmur. No gallop.   Pulmonary:     Effort: Pulmonary effort is normal. No respiratory distress.     Breath sounds: Normal breath sounds. No wheezing, rhonchi or rales.  Abdominal:     General: Bowel sounds are normal. There is no distension or abdominal bruit.     Palpations: Abdomen is soft. There is no fluid wave or mass.     Tenderness: There is no abdominal tenderness. There is no guarding or rebound.     Hernia: No hernia is present.  Musculoskeletal:     Cervical back: Normal range of motion and neck supple.  Lymphadenopathy:  Cervical: No cervical adenopathy.  Skin:    General: Skin is warm and dry.     Findings: No rash.  Neurological:     Mental Status: She is alert.     Cranial Nerves: No cranial nerve deficit.     Sensory: No sensory deficit.  Psychiatric:        Mood and Affect: Mood is not anxious or depressed.        Speech: Speech normal.        Behavior: Behavior normal. Behavior is cooperative.        Judgment: Judgment normal.      Assessment and Plan   The patient's preventative maintenance and recommended screening tests for an annual wellness exam were reviewed in full today. Brought up to date unless services declined.  Counselled on the importance of diet, exercise, and its role in overall health and mortality. The patient's FH and SH was reviewed, including their home life, tobacco status, and drug and alcohol status.   Vaccines: Uptodate with flu, zoster, PCV 23, 13, Tdap No pap indicated/DVE 2014. No cancer in family. No vaginal bleeding. STOP DVE. Mammogram:04/2018 nml, DXA: osteopenia 2012, nml vit D, plan repeat in 5 years. Colon cancer screen: cologuard  2018  Hypertension Tolerable control . Goal at age < 150/90.  Continue amlodipine and HCTZ.  Coronary atherosclerosis Asymptomatic. Needs more aggressive cholesterol treatment.  CKD (chronic kidney disease) stage 3, GFR 30-59 ml/min Stable control.  HYPERCHOLESTEROLEMIA Poor control off pravastatin.  SE to atorvastatin in past. Goal LDL now < 70 give coronary atherosclerosis seen on Chest CT.  Start crestor 10 mg and increase as tolerated... re-eval in 3 months.  GERD (gastroesophageal reflux disease) Inadequate control OFF PPI. Pt worried about long term complicaitons of PPI but still having 3-4 days a week with GERD.  WIll start back low dose OTC prilosec.  Pantoprazole did not help much with cough.   Eliezer Lofts, MD

## 2019-08-09 NOTE — Assessment & Plan Note (Signed)
Inadequate control OFF PPI. Pt worried about long term complicaitons of PPI but still having 3-4 days a week with GERD.  WIll start back low dose OTC prilosec.  Pantoprazole did not help much with cough.

## 2019-08-09 NOTE — Assessment & Plan Note (Signed)
Tolerable control . Goal at age < 150/90.  Continue amlodipine and HCTZ.

## 2019-08-12 ENCOUNTER — Telehealth: Payer: Self-pay

## 2019-08-12 NOTE — Telephone Encounter (Signed)
Ron[facility] and [place] [facility] calling to get VO for famciclovir 250 mg; they said came to [facility] with fax header and that requires verbal order. Per pts med list appears was [facility] on 08/09/19. But gave VO for famciclovir 250 mg # 90 x 3 taking one tab po daily. Colletta [place] voiced understanding and nothing further needed.

## 2019-08-13 ENCOUNTER — Other Ambulatory Visit: Payer: Self-pay | Admitting: Family Medicine

## 2019-08-13 DIAGNOSIS — Z1231 Encounter for screening mammogram for malignant neoplasm of breast: Secondary | ICD-10-CM

## 2019-08-29 ENCOUNTER — Ambulatory Visit: Payer: Medicare Other | Admitting: Internal Medicine

## 2019-08-30 ENCOUNTER — Other Ambulatory Visit: Payer: Self-pay | Admitting: *Deleted

## 2019-08-30 MED ORDER — ROSUVASTATIN CALCIUM 10 MG PO TABS: 10.0000 mg | ORAL_TABLET | Freq: Every day | ORAL | 3 refills | Status: DC

## 2019-09-04 ENCOUNTER — Other Ambulatory Visit: Payer: Self-pay | Admitting: Family Medicine

## 2019-09-04 ENCOUNTER — Ambulatory Visit
Admission: RE | Admit: 2019-09-04 | Discharge: 2019-09-04 | Disposition: A | Discharge status: Home / Self Care | Payer: Medicare Other | Source: Ambulatory Visit | Attending: Family Medicine | Admitting: Family Medicine

## 2019-09-04 DIAGNOSIS — Z1231 Encounter for screening mammogram for malignant neoplasm of breast: Secondary | ICD-10-CM

## 2019-09-04 DIAGNOSIS — M85851 Other specified disorders of bone density and structure, right thigh: Secondary | ICD-10-CM | POA: Diagnosis not present

## 2019-09-04 DIAGNOSIS — Z780 Asymptomatic menopausal state: Secondary | ICD-10-CM | POA: Diagnosis not present

## 2019-09-04 DIAGNOSIS — E2839 Other primary ovarian failure: Secondary | ICD-10-CM | POA: Insufficient documentation

## 2019-09-04 DIAGNOSIS — R928 Other abnormal and inconclusive findings on diagnostic imaging of breast: Secondary | ICD-10-CM

## 2019-09-06 ENCOUNTER — Encounter: Payer: Self-pay | Admitting: *Deleted

## 2019-09-11 ENCOUNTER — Other Ambulatory Visit: Payer: Self-pay

## 2019-09-11 ENCOUNTER — Encounter: Payer: Medicare Other | Admitting: Orthotics

## 2019-09-19 ENCOUNTER — Ambulatory Visit
Admission: RE | Admit: 2019-09-19 | Discharge: 2019-09-19 | Disposition: A | Discharge status: Home / Self Care | Payer: Medicare Other | Source: Ambulatory Visit | Attending: Family Medicine | Admitting: Family Medicine

## 2019-09-19 DIAGNOSIS — R922 Inconclusive mammogram: Secondary | ICD-10-CM | POA: Diagnosis not present

## 2019-09-19 DIAGNOSIS — R928 Other abnormal and inconclusive findings on diagnostic imaging of breast: Secondary | ICD-10-CM

## 2019-11-14 ENCOUNTER — Ambulatory Visit: Payer: Medicare Other | Admitting: Podiatry

## 2019-11-18 ENCOUNTER — Other Ambulatory Visit: Payer: Self-pay

## 2019-11-18 ENCOUNTER — Ambulatory Visit (INDEPENDENT_AMBULATORY_CARE_PROVIDER_SITE_OTHER): Payer: Medicare Other | Admitting: Podiatry

## 2019-11-18 ENCOUNTER — Encounter: Payer: Self-pay | Admitting: Podiatry

## 2019-11-18 VITALS — Temp 97.6°F

## 2019-11-18 DIAGNOSIS — M2012 Hallux valgus (acquired), left foot: Secondary | ICD-10-CM | POA: Diagnosis not present

## 2019-11-18 DIAGNOSIS — I2510 Atherosclerotic heart disease of native coronary artery without angina pectoris: Secondary | ICD-10-CM | POA: Diagnosis not present

## 2019-11-18 DIAGNOSIS — M7752 Other enthesopathy of left foot: Secondary | ICD-10-CM | POA: Diagnosis not present

## 2019-11-18 NOTE — Progress Notes (Signed)
This patient returns to the office with continued pain and discomfort noted forefoot  of the left foot.  She previously had been treated with injection therapy and Mobic.  I therefore recommended orthotic therapy in an effort to help to relieve the pain under her left forefoot.  She says she picked up the orthoses and has not worn them since they do not fit in any of her shoes.  She also says she is very concerned about the fact they cost $400 and according to the patient  she was not told the price.  She says she has been wearing a dispersion pad from this office which has been helpful but she is concerned that the glue will cause a dermatitis to develop on her left forefoot.  This patient has also been taking Mobic as needed she presents the office today for continued evaluation and treatment of her left foot.  Vascular  Dorsalis pedis and posterior tibial pulses are palpable  B/L.  Capillary return  WNL.  Temperature gradient is  WNL.  Skin turgor  WNL  Sensorium  Senn Weinstein monofilament wire  WNL. Normal tactile sensation.  Nail Exam  Patient has normal nails with no evidence of bacterial or fungal infection.  Orthopedic  Exam  Muscle tone and muscle strength  WNL.  No limitations of motion feet  B/L.  No crepitus or joint effusion noted.  Foot type is unremarkable and digits show no abnormalities.  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  Palpable pain noted distal to the plantar condyles second metatarsal left foot.  Skin  No open lesions.  Normal skin texture and turgor.  HAV  B/L with capsulitis left foot.  ROV.  Discussed this condition with this patient.  Told her the conservative treatments which include padding injection therapy and anti-inflammatory medication have been performed.  Told this patient that she may benefit from surgical consultation with one of the surgical podiatrist in the practice.  She was not interested in surgery at this time.  I did  examine the orthoses which were prescribed and they were made very well but she again states she cannot wear these in any of her shoes.  I did provide her with additional padding and recommended she consider a gel you formed pad to take pressure off the site of the pain second metatarsal left foot.  She was dispensed the paperwork.  Told her that if the problem persists for her to return to the office and we will perform injection therapy at that site.  RTC prn.   Gardiner Barefoot DPM

## 2019-11-26 ENCOUNTER — Telehealth: Payer: Self-pay | Admitting: Podiatry

## 2019-11-26 NOTE — Telephone Encounter (Signed)
Pt seen Dr Prudence Davidson last week in [place] and returned the orthotics.  She called today and does not want them. I did ask pt if she has came back in to see Liliane Channel to possibly adjust them and she said no and that when she picked them up she had on sandals and was told by Liliane Channel that she wore the wrong shoes. To go home and try them in her tennis shoes and if they don't fit to take out the insoles and trim them if needed. States they do not fit her lifestyle that she wears mostly sandals. I explained that she did sign a abn stating the cost of the orthotics and that she would be responsible for the cost. I did explain that they are not returnable since they are custom made for her. She stated that they were not ran thru her insurance and she was not aware that they were not going to be done.   I told pt I would send a message to the administrator and we can go from there.

## 2019-11-27 NOTE — Telephone Encounter (Signed)
We can't keep absorbing the cost of f/o when patients decide they do not want them.  They are custom made and told up front that their insurance will not pay and they sign Medicare ABN; yet they agree to appointment for casting and dispensing.  Dawn, please forward this to [place]. Liliane Channel

## 2019-12-31 DIAGNOSIS — H52223 Regular astigmatism, bilateral: Secondary | ICD-10-CM | POA: Diagnosis not present

## 2019-12-31 DIAGNOSIS — H524 Presbyopia: Secondary | ICD-10-CM | POA: Diagnosis not present

## 2019-12-31 DIAGNOSIS — H338 Other retinal detachments: Secondary | ICD-10-CM | POA: Diagnosis not present

## 2019-12-31 DIAGNOSIS — H35373 Puckering of macula, bilateral: Secondary | ICD-10-CM | POA: Diagnosis not present

## 2019-12-31 DIAGNOSIS — H43393 Other vitreous opacities, bilateral: Secondary | ICD-10-CM | POA: Diagnosis not present

## 2019-12-31 DIAGNOSIS — Z961 Presence of intraocular lens: Secondary | ICD-10-CM | POA: Diagnosis not present

## 2019-12-31 DIAGNOSIS — H04123 Dry eye syndrome of bilateral lacrimal glands: Secondary | ICD-10-CM | POA: Diagnosis not present

## 2019-12-31 DIAGNOSIS — H40013 Open angle with borderline findings, low risk, bilateral: Secondary | ICD-10-CM | POA: Diagnosis not present

## 2020-02-11 ENCOUNTER — Other Ambulatory Visit: Payer: Self-pay | Admitting: Internal Medicine

## 2020-02-14 ENCOUNTER — Telehealth: Payer: Self-pay | Admitting: Internal Medicine

## 2020-02-14 NOTE — Telephone Encounter (Signed)
Ok she needs covid testing as soon as possible. Take tylenol 650mg  q 4-6 hours for fever (not to exceed 4 grams in 24 hours). Stay well hydrated. Quarantine until testing results come back. If positive would recommend we set her up for monoclonal antibody infusion next week. We are closed Monday. If her respiratory symptoms worsen she needs to present to ED.

## 2020-02-14 NOTE — Telephone Encounter (Signed)
Spoke with the pt  She is c/o lethary, sneezing, wheezing, cough with green sputum x 2 wks  States had temp of 100.3 3 days ago  She states her spouse had same symptoms and was dx with RSV  She has not been vaccinated against covid and states has not been tested  Please advise thanks

## 2020-02-14 NOTE — Telephone Encounter (Signed)
Spoke with pt, aware of recs.  Pt states she refuses to go to ED, is willing to get a covid test from a local pharmacy.  Forwarding to [employer] as FYI.

## 2020-02-14 NOTE — Telephone Encounter (Signed)
Will forward to APP of the day since Kasa not on until 7 p

## 2020-02-14 NOTE — Telephone Encounter (Signed)
Patient is aware of recs.  Nothing further needed at this time- will close encounter.

## 2020-02-14 NOTE — Telephone Encounter (Signed)
I would advise ED evaluation d/t lethargy and fever, suspect she has covid.

## 2020-02-25 ENCOUNTER — Telehealth: Payer: Self-pay | Admitting: Internal Medicine

## 2020-02-25 MED ORDER — ARNUITY ELLIPTA 100 MCG/ACT IN AEPB: 1.0000 | INHALATION_SPRAY | Freq: Every day | RESPIRATORY_TRACT | 3 refills | Status: DC

## 2020-02-25 NOTE — Telephone Encounter (Signed)
Rx for arnuity has been sent to preferred pharmacy for pt since pt does have a f/u scheduled. Called and spoke with pt letting her know this was done and stated to her to make sure she kept her upcoming appt. Pt verbalized understanding. Nothing further needed.

## 2020-03-04 ENCOUNTER — Telehealth: Payer: Self-pay

## 2020-03-04 MED ORDER — FLUTICASONE PROPIONATE HFA 220 MCG/ACT IN AERO: 2.0000 | INHALATION_SPRAY | Freq: Two times a day (BID) | RESPIRATORY_TRACT | 1 refills | Status: DC

## 2020-03-04 NOTE — Telephone Encounter (Signed)
Rx for Flovent 252mcg has been sent to preferred pharmacy. Patient is aware and voiced her understanding.  Nothing further needed.

## 2020-03-04 NOTE — Telephone Encounter (Signed)
Received denial letter for Arnuity from [facility].  Cover alternative is flovent HFA or [facility].  Dr. Mortimer Fries, please advise. Thanks

## 2020-03-04 NOTE — Telephone Encounter (Signed)
HFA FLOVENT HIGHEST DOSE, [place]

## 2020-03-11 ENCOUNTER — Ambulatory Visit (INDEPENDENT_AMBULATORY_CARE_PROVIDER_SITE_OTHER): Payer: Medicare Other | Admitting: Dermatology

## 2020-03-11 ENCOUNTER — Encounter: Payer: Self-pay | Admitting: Dermatology

## 2020-03-11 ENCOUNTER — Other Ambulatory Visit: Payer: Self-pay

## 2020-03-11 DIAGNOSIS — L821 Other seborrheic keratosis: Secondary | ICD-10-CM

## 2020-03-11 DIAGNOSIS — I2510 Atherosclerotic heart disease of native coronary artery without angina pectoris: Secondary | ICD-10-CM

## 2020-03-11 DIAGNOSIS — D1800 Hemangioma unspecified site: Secondary | ICD-10-CM | POA: Diagnosis not present

## 2020-03-11 DIAGNOSIS — L820 Inflamed seborrheic keratosis: Secondary | ICD-10-CM

## 2020-03-11 DIAGNOSIS — L578 Other skin changes due to chronic exposure to nonionizing radiation: Secondary | ICD-10-CM

## 2020-03-11 NOTE — Patient Instructions (Signed)
Cryotherapy Aftercare  . Wash gently with soap and water everyday.   . Apply Vaseline and Band-Aid daily until healed.  

## 2020-03-11 NOTE — Progress Notes (Signed)
   Follow-Up Visit   Subjective  Patty Shelton is a 79 y.o. female who presents for the following: bumps (chest, ~61yr, irritating) and thickened skin (back, ~47yr).  The following portions of the chart were reviewed this encounter and updated as appropriate:  Tobacco  Allergies  Meds  Problems  Med Hx  Surg Hx  Fam Hx     Review of Systems:  No other skin or systemic complaints except as noted in HPI or Assessment and Plan.  Objective  Well appearing patient in no apparent distress; mood and affect are within normal limits.  A focused examination was performed including chest, back. Relevant physical exam findings are noted in the Assessment and Plan.  Objective  chest x 18, back x 1 (19): Erythematous keratotic or waxy stuck-on papule or plaque.    Assessment & Plan    Seborrheic Keratoses - Stuck-on, waxy, tan-brown papules and plaques  - Discussed benign etiology and prognosis. - Observe - Call for any changes  Actinic Damage - diffuse scaly erythematous macules with underlying dyspigmentation - Recommend daily broad spectrum sunscreen SPF 30+ to sun-exposed areas, reapply every 2 hours as needed.  - Call for new or changing lesions.  Hemangiomas - Red papules - Discussed benign nature - Observe - Call for any changes   Inflamed seborrheic keratosis (19) chest x 18, back x 1  Destruction of lesion - chest x 18, back x 1 Complexity: simple   Destruction method: cryotherapy   Informed consent: discussed and consent obtained   Timeout:  patient name, date of birth, surgical site, and procedure verified Lesion destroyed using liquid nitrogen: Yes   Region frozen until ice ball extended beyond lesion: Yes   Outcome: patient tolerated procedure well with no complications   Post-procedure details: wound care instructions given    Return in about 6 months (around 09/08/2020) for TBSE.  I, Othelia Pulling, RMA, am acting as scribe for Sarina Ser, MD  .  Documentation: I have reviewed the above documentation for accuracy and completeness, and I agree with the above.  Sarina Ser, MD

## 2020-04-02 ENCOUNTER — Encounter: Payer: Self-pay | Admitting: Adult Health

## 2020-04-02 ENCOUNTER — Other Ambulatory Visit: Payer: Self-pay

## 2020-04-02 ENCOUNTER — Ambulatory Visit (INDEPENDENT_AMBULATORY_CARE_PROVIDER_SITE_OTHER): Payer: Medicare Other | Admitting: Adult Health

## 2020-04-02 VITALS — BP 126/82 | HR 75 | Temp 97.5°F | Ht 66.5 in | Wt 178.0 lb

## 2020-04-02 DIAGNOSIS — J849 Interstitial pulmonary disease, unspecified: Secondary | ICD-10-CM | POA: Diagnosis not present

## 2020-04-02 DIAGNOSIS — I2510 Atherosclerotic heart disease of native coronary artery without angina pectoris: Secondary | ICD-10-CM | POA: Diagnosis not present

## 2020-04-02 DIAGNOSIS — K219 Gastro-esophageal reflux disease without esophagitis: Secondary | ICD-10-CM | POA: Diagnosis not present

## 2020-04-02 DIAGNOSIS — Z23 Encounter for immunization: Secondary | ICD-10-CM

## 2020-04-02 DIAGNOSIS — J479 Bronchiectasis, uncomplicated: Secondary | ICD-10-CM | POA: Diagnosis not present

## 2020-04-02 NOTE — Patient Instructions (Signed)
Restart Flutter valve 2-3 times (5-10 times each session )  Mucinex Twice daily  As needed  Cough/congestion .  Flu shot today .  Set up PFTs.  Activity as tolerated.  Flovent 2 puffs Twice daily  , rinse after use.  Follow up with Dr. Mortimer Fries in 6 months and As needed

## 2020-04-02 NOTE — Progress Notes (Signed)
@Patient  ID: Patty Shelton, female    DOB: [birth year], 79 y.o.   MRN: [id number]  Chief Complaint  Patient presents with  . 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 No frank honeycombing. No definite progression since 11/27/2016   04/02/2020 Follow up : Bronchiectasis and ILD Patient presents for a 1 year follow-up.  Patient has underlying mild bronchiectasis and ILD.  She is a never smoker.  She says since last visit she is doing well with no flare of cough or wheezing.  Has not been on antibiotics or steroids over the last year.  She says her activity level as at baseline.  She does not do any formal exercise but is very active at home.  Does her own housework.  Is independent and drives.  She has not on oxygen.  She declines the Covid vaccines and says she absolutely will not get this.  She does request a flu shot. Previous high-resolution CT chest in April 2020 showed stable ILD and mild bronchiectasis with no progression since 2018. She is supposed to be using the flutter valve but says that she is not using this.  She is on Flovent.  But says she only uses this as needed. She says she has a cough that has been at baseline.  Is minimally productive.    No flutter  Allergies  Allergen Reactions  . Lipitor [Atorvastatin] Other (See Comments)    Aches at 40mg  dose Leg cramps     Immunization History  Administered Date(s) Administered  . Fluad Quad(high Dose 65+) 03/14/2019, 04/02/2020  . Influenza Split 05/24/2011, 03/01/2013, 04/24/2015  . Influenza Whole 03/27/2009  . Influenza, High Dose  Seasonal PF 02/29/2016, 04/07/2017, 05/24/2018  . Influenza,inj,Quad PF,6+ Mos 04/03/2014  . Pneumococcal Conjugate-13 04/03/2014, 04/07/2017  . Pneumococcal Polysaccharide-23 03/27/2009  . Tdap 03/11/2011  . Zoster 06/02/2014    Past Medical History:  Diagnosis Date  . Hyperlipemia   . Hypertension     Tobacco History: Social History   Tobacco Use  Smoking Status Never Smoker  Smokeless Tobacco Never Used   Counseling given: Not Answered   Outpatient Medications Prior to Visit  Medication Sig Dispense Refill  . acetaminophen (TYLENOL) 500 MG tablet Take 500 mg by mouth every 6 (six) hours as needed.    Marland Kitchen albuterol (PROVENTIL HFA;VENTOLIN HFA) 108 (90 Base) MCG/ACT inhaler Inhale 2 puffs into the lungs every 6 (six) hours as needed for wheezing or shortness of breath. 1 Inhaler 2  . Biotin 10 MG TABS Take 1 tablet by mouth daily.    . Calcium Carbonate-Vitamin D (CALCIUM 600+D) 600-400 MG-UNIT per tablet Take 1 tablet by mouth daily.    . cetirizine (ZYRTEC) 10 MG tablet Take 10 mg by mouth daily.     Marland Kitchen CRANBERRY PO Take 1 capsule by mouth daily.    . diclofenac sodium (VOLTAREN) 1 % GEL Apply 4 g topically 4 (four) times daily as needed.    . famciclovir (FAMVIR) 250 MG tablet Take 1 tablet (250 mg total) by mouth daily. 90 tablet 3  . fluticasone (FLONASE) 50 MCG/ACT nasal spray Place 2 sprays into both nostrils as needed.     Marland Kitchen  mupirocin nasal ointment (BACTROBAN) 2 % Use small amount in each nostril twice daily for five (5) days. After application, press sides of nose together and gently massage. 10 g 0  . Respiratory Therapy Supplies (FLUTTER) DEVI 1 Device by Does not apply route as directed. 1 each 0  . rosuvastatin (CRESTOR) 10 MG tablet Take 1 tablet (10 mg total) by mouth daily. 90 tablet 3  . traMADol (ULTRAM) 50 MG tablet Take 1 tablet (50 mg total) by mouth every 8 (eight) hours as needed. 15 tablet 0  . ALPRAZolam (XANAX) 0.25 MG tablet Take 1 tablet (0.25 mg  total) by mouth daily as needed for anxiety. (Patient not taking: Reported on 04/02/2020) 20 tablet 0  . amLODipine (NORVASC) 5 MG tablet Take 1 tablet (5 mg total) by mouth daily. (Patient not taking: Reported on 04/02/2020) 30 tablet 3  . fluticasone (FLOVENT HFA) 220 MCG/ACT inhaler Inhale 2 puffs into the lungs 2 (two) times daily. (Patient not taking: Reported on 04/02/2020) 3 each 1  . meloxicam (MOBIC) 15 MG tablet Take 15 mg by mouth daily. (Patient not taking: Reported on 04/02/2020)    . pantoprazole (PROTONIX) 40 MG tablet Take 1 tablet (40 mg total) by mouth daily. (Patient not taking: Reported on 04/02/2020) 30 tablet 2   No facility-administered medications prior to visit.     Review of Systems:   Constitutional:   No  weight loss, night sweats,  Fevers, chills, fatigue, or  lassitude.  HEENT:   No headaches,  Difficulty swallowing,  Tooth/dental problems, or  Sore throat,                No sneezing, itching, ear ache, nasal congestion, post nasal drip,   CV:  No chest pain,  Orthopnea, PND, swelling in lower extremities, anasarca, dizziness, palpitations, syncope.   GI  No heartburn, indigestion, abdominal pain, nausea, vomiting, diarrhea, change in bowel habits, loss of appetite, bloody stools.   Resp:  No chest wall deformity  Skin: no rash or lesions.  GU: no dysuria, change in color of urine, no urgency or frequency.  No flank pain, no hematuria   MS:  No joint pain or swelling.  No decreased range of motion.  No back pain.    Physical Exam  BP 126/82 (BP Location: Left Arm, Patient Position: Sitting, Cuff Size: Normal)   Pulse 75   Temp (!) 97.5 F (36.4 C) (Temporal)   Ht 5' 6.5" (1.689 m)   Wt 178 lb (80.7 kg)   SpO2 95%   BMI 28.30 kg/m   GEN: A/Ox3; pleasant , NAD, well nourished    HEENT:  [place]/AT,   NOSE-clear, THROAT-clear, no lesions, no postnasal drip or exudate noted.   NECK:  Supple w/ fair ROM; no JVD; normal carotid impulses w/o bruits; no  thyromegaly or nodules palpated; no lymphadenopathy.    RESP  Clear  P & A; w/o, wheezes/ rales/ or rhonchi. no accessory muscle use, no dullness to percussion  CARD:  RRR, no m/r/g, no peripheral edema, pulses intact, no cyanosis or clubbing.  GI:   Soft & nt; nml bowel sounds; no organomegaly or masses detected.   Musco: Warm bil, no deformities or joint swelling noted.   Neuro: alert, no focal deficits noted.    Skin: Warm, no lesions or rashes    Lab Results:  CBC    No flowsheet data found.  No results found for: NITRICOXIDE      Assessment & Plan:  Bronchiectasis without complication (HCC) Appears stable.  Would like patient to restart flutter valve using 2-3 times a day.  Mucolytic's if needed. No recent antibiotics or steroid use. Check PFTs  Plan  Patient Instructions  Restart Flutter valve 2-3 times (5-10 times each session )  Mucinex Twice daily  As needed  Cough/congestion .  Flu shot today .  Set up PFTs.  Activity as tolerated.  Flovent 2 puffs Twice daily  , rinse after use.  Follow up with Dr. Mortimer Fries in 6 months and As needed        GERD (gastroesophageal reflux disease) Continue on GERD treatment.  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 Appears to be clinically stable.  Will check PFTs. Continue to follow.  Plan  Patient Instructions  Restart Flutter valve 2-3 times (5-10 times each session )  Mucinex Twice daily  As needed  Cough/congestion .  Flu shot today .  Set up PFTs.  Activity as tolerated.  Flovent 2 puffs Twice daily  , rinse after use.  Follow up with Dr. Mortimer Fries in 6 months and As needed           Rexene Edison, NP 04/02/2020

## 2020-04-02 NOTE — Assessment & Plan Note (Signed)
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 Appears to be clinically stable.  Will check PFTs. Continue to follow.  Plan  Patient Instructions  Restart Flutter valve 2-3 times (5-10 times each session )  Mucinex Twice daily  As needed  Cough/congestion .  Flu shot today .  Set up PFTs.  Activity as tolerated.  Flovent 2 puffs Twice daily  , rinse after use.  Follow up with Dr. Mortimer Fries in 6 months and As needed

## 2020-04-02 NOTE — Assessment & Plan Note (Signed)
Appears stable.  Would like patient to restart flutter valve using 2-3 times a day.  Mucolytic's if needed. No recent antibiotics or steroid use. Check PFTs  Plan  Patient Instructions  Restart Flutter valve 2-3 times (5-10 times each session )  Mucinex Twice daily  As needed  Cough/congestion .  Flu shot today .  Set up PFTs.  Activity as tolerated.  Flovent 2 puffs Twice daily  , rinse after use.  Follow up with Dr. Mortimer Fries in 6 months and As needed

## 2020-04-02 NOTE — Assessment & Plan Note (Signed)
Continue on GERD treatment 

## 2020-04-27 DIAGNOSIS — M7542 Impingement syndrome of left shoulder: Secondary | ICD-10-CM | POA: Diagnosis not present

## 2020-08-03 ENCOUNTER — Telehealth: Payer: Self-pay | Admitting: Family Medicine

## 2020-08-03 DIAGNOSIS — M8589 Other specified disorders of bone density and structure, multiple sites: Secondary | ICD-10-CM

## 2020-08-03 DIAGNOSIS — E7800 Pure hypercholesterolemia, unspecified: Secondary | ICD-10-CM

## 2020-08-03 NOTE — Telephone Encounter (Signed)
-----   Message from Cloyd Stagers, RT sent at 07/27/2020  2:09 PM EST ----- Regarding: Lab Orders for Wednesday 3.2.2022 Please place lab orders for Wednesday 3.2.2022, office visit for physical on Tuesday 3.8.2022 Thank you, Dyke Maes RT(R)

## 2020-08-11 ENCOUNTER — Ambulatory Visit (INDEPENDENT_AMBULATORY_CARE_PROVIDER_SITE_OTHER): Payer: Medicare Other

## 2020-08-11 ENCOUNTER — Other Ambulatory Visit: Payer: Self-pay

## 2020-08-11 DIAGNOSIS — Z0000 Encounter for general adult medical examination without abnormal findings: Secondary | ICD-10-CM

## 2020-08-11 NOTE — Patient Instructions (Signed)
Patty Shelton , Thank you for taking time to come for your Medicare Wellness Visit. I appreciate your ongoing commitment to your health goals. Please review the following plan we discussed and let me know if I can assist you in the future.   Screening recommendations/referrals: Colonoscopy: no longer required  Mammogram: Up to date, completed 09/04/2019, due 09/03/2020 Bone Density: Up to date, completed 09/04/2019, due 08/2021 Recommended yearly ophthalmology/optometry visit for glaucoma screening and checkup Recommended yearly dental visit for hygiene and checkup  Vaccinations: Influenza vaccine: Up to date, completed 04/02/2020, due 01/2021 Pneumococcal vaccine: Completed series Tdap vaccine: Up to date, completed 03/11/2011, due 02/2021 Shingles vaccine: due, check with your insurance regarding coverage if interested    Covid-19:declined  Advanced directives: Advance directive discussed with you today. Even though you declined this today please call our office should you change your mind and we can give you the proper paperwork for you to fill out.   Conditions/risks identified: hypertension, hypercholesterolemia  Next appointment: Follow up in one year for your annual wellness visit    Preventive Care 21 Years and Older, Female Preventive care refers to lifestyle choices and visits with your health care provider that can promote health and wellness. What does preventive care include?  A yearly physical exam. This is also called an annual well check.  Dental exams once or twice a year.  Routine eye exams. Ask your health care provider how often you should have your eyes checked.  Personal lifestyle choices, including:  Daily care of your teeth and gums.  Regular physical activity.  Eating a healthy diet.  Avoiding tobacco and drug use.  Limiting alcohol use.  Practicing safe sex.  Taking low-dose aspirin every day.  Taking vitamin and mineral supplements as recommended by  your health care provider. What happens during an annual well check? The services and screenings done by your health care provider during your annual well check will depend on your age, overall health, lifestyle risk factors, and family history of disease. Counseling  Your health care provider may ask you questions about your:  Alcohol use.  Tobacco use.  Drug use.  Emotional well-being.  Home and relationship well-being.  Sexual activity.  Eating habits.  History of falls.  Memory and ability to understand (cognition).  Work and work Statistician.  Reproductive health. Screening  You may have the following tests or measurements:  Height, weight, and BMI.  Blood pressure.  Lipid and cholesterol levels. These may be checked every 5 years, or more frequently if you are over 57 years old.  Skin check.  Lung cancer screening. You may have this screening every year starting at age 83 if you have a 30-pack-year history of smoking and currently smoke or have quit within the past 15 years.  Fecal occult blood test (FOBT) of the stool. You may have this test every year starting at age 22.  Flexible sigmoidoscopy or colonoscopy. You may have a sigmoidoscopy every 5 years or a colonoscopy every 10 years starting at age 53.  Hepatitis C blood test.  Hepatitis B blood test.  Sexually transmitted disease (STD) testing.  Diabetes screening. This is done by checking your blood sugar (glucose) after you have not eaten for a while (fasting). You may have this done every 1-3 years.  Bone density scan. This is done to screen for osteoporosis. You may have this done starting at age 27.  Mammogram. This may be done every 1-2 years. Talk to your health care provider  about how often you should have regular mammograms. Talk with your health care provider about your test results, treatment options, and if necessary, the need for more tests. Vaccines  Your health care provider may  recommend certain vaccines, such as:  Influenza vaccine. This is recommended every year.  Tetanus, diphtheria, and acellular pertussis (Tdap, Td) vaccine. You may need a Td booster every 10 years.  Zoster vaccine. You may need this after age 1.  Pneumococcal 13-valent conjugate (PCV13) vaccine. One dose is recommended after age 69.  Pneumococcal polysaccharide (PPSV23) vaccine. One dose is recommended after age 50. Talk to your health care provider about which screenings and vaccines you need and how often you need them. This information is not intended to replace advice given to you by your health care provider. Make sure you discuss any questions you have with your health care provider. Document Released: 06/26/2015 Document Revised: 02/17/2016 Document Reviewed: 03/31/2015 Elsevier Interactive Patient Education  2017 [place] Prevention in the Home Falls can cause injuries. They can happen to people of all ages. There are many things you can do to make your home safe and to help prevent falls. What can I do on the outside of my home?  Regularly fix the edges of walkways and driveways and fix any cracks.  Remove anything that might make you trip as you walk through a door, such as a raised step or threshold.  Trim any bushes or trees on the path to your home.  Use bright outdoor lighting.  Clear any walking paths of anything that might make someone trip, such as rocks or tools.  Regularly check to see if handrails are loose or broken. Make sure that both sides of any steps have handrails.  Any raised decks and porches should have guardrails on the edges.  Have any leaves, snow, or ice cleared regularly.  Use sand or salt on walking paths during winter.  Clean up any spills in your garage right away. This includes oil or grease spills. What can I do in the bathroom?  Use night lights.  Install grab bars by the toilet and in the tub and shower. Do not use towel  bars as grab bars.  Use non-skid mats or decals in the tub or shower.  If you need to sit down in the shower, use a plastic, non-slip stool.  Keep the floor dry. Clean up any water that spills on the floor as soon as it happens.  Remove soap buildup in the tub or shower regularly.  Attach bath mats securely with double-sided non-slip rug tape.  Do not have throw rugs and other things on the floor that can make you trip. What can I do in the bedroom?  Use night lights.  Make sure that you have a light by your bed that is easy to reach.  Do not use any sheets or blankets that are too big for your bed. They should not hang down onto the floor.  Have a firm chair that has side arms. You can use this for support while you get dressed.  Do not have throw rugs and other things on the floor that can make you trip. What can I do in the kitchen?  Clean up any spills right away.  Avoid walking on wet floors.  Keep items that you use a lot in easy-to-reach places.  If you need to reach something above you, use a strong step stool that has a grab bar.  Keep electrical cords out of the way.  Do not use floor polish or wax that makes floors slippery. If you must use wax, use non-skid floor wax.  Do not have throw rugs and other things on the floor that can make you trip. What can I do with my stairs?  Do not leave any items on the stairs.  Make sure that there are handrails on both sides of the stairs and use them. Fix handrails that are broken or loose. Make sure that handrails are as long as the stairways.  Check any carpeting to make sure that it is firmly attached to the stairs. Fix any carpet that is loose or worn.  Avoid having throw rugs at the top or bottom of the stairs. If you do have throw rugs, attach them to the floor with carpet tape.  Make sure that you have a light switch at the top of the stairs and the bottom of the stairs. If you do not have them, ask someone to  add them for you. What else can I do to help prevent falls?  Wear shoes that:  Do not have high heels.  Have rubber bottoms.  Are comfortable and fit you well.  Are closed at the toe. Do not wear sandals.  If you use a stepladder:  Make sure that it is fully opened. Do not climb a closed stepladder.  Make sure that both sides of the stepladder are locked into place.  Ask someone to hold it for you, if possible.  Clearly mark and make sure that you can see:  Any grab bars or handrails.  First and last steps.  Where the edge of each step is.  Use tools that help you move around (mobility aids) if they are needed. These include:  Canes.  Walkers.  Scooters.  Crutches.  Turn on the lights when you go into a dark area. Replace any light bulbs as soon as they burn out.  Set up your furniture so you have a clear path. Avoid moving your furniture around.  If any of your floors are uneven, fix them.  If there are any pets around you, be aware of where they are.  Review your medicines with your doctor. Some medicines can make you feel dizzy. This can increase your chance of falling. Ask your doctor what other things that you can do to help prevent falls. This information is not intended to replace advice given to you by your health care provider. Make sure you discuss any questions you have with your health care provider. Document Released: 03/26/2009 Document Revised: 11/05/2015 Document Reviewed: 07/04/2014 Elsevier Interactive Patient Education  2017 [employer].

## 2020-08-11 NOTE — Progress Notes (Signed)
Subjective:   Patty Shelton is a 80 y.o. female who presents for Medicare Annual (Subsequent) preventive examination.  Review of Systems: N/A      I connected with the patient today by telephone and verified that I am speaking with the correct person using two identifiers. Location patient: home Location nurse: work Persons participating in the telephone visit: patient, nurse.   I discussed the limitations, risks, security and privacy concerns of performing an evaluation and management service by telephone and the availability of in person appointments. I also discussed with the patient that there may be a patient responsible charge related to this service. The patient expressed understanding and verbally consented to this telephonic visit.        Cardiac Risk Factors include: advanced age (>31men, >41 women);female gender;hypertension;Other (see comment), Risk factor comments: hypercholesterolemia     Objective:    Today's Vitals   There is no height or weight on file to calculate BMI.  Advanced Directives 08/11/2020 08/02/2019 07/26/2018 07/18/2017 11/27/2016 05/19/2016  Does Patient Have a Medical Advance Directive? No No No No No No  Would patient like information on creating a medical advance directive? No - Patient declined No - Patient declined No - Patient declined No - Patient declined Yes (ED - Information included in AVS) -    Current Medications (verified) Outpatient Encounter Medications as of 08/11/2020  Medication Sig  . acetaminophen (TYLENOL) 500 MG tablet Take 500 mg by mouth every 6 (six) hours as needed.  Marland Kitchen albuterol (PROVENTIL HFA;VENTOLIN HFA) 108 (90 Base) MCG/ACT inhaler Inhale 2 puffs into the lungs every 6 (six) hours as needed for wheezing or shortness of breath.  . Biotin 10 MG TABS Take 1 tablet by mouth daily.  . Calcium Carbonate-Vitamin D 600-400 MG-UNIT tablet Take 1 tablet by mouth daily.  . cetirizine (ZYRTEC) 10 MG tablet Take 10 mg by mouth daily.   Marland Kitchen CRANBERRY PO Take 1 capsule by mouth daily.  . diclofenac sodium (VOLTAREN) 1 % GEL Apply 4 g topically 4 (four) times daily as needed.  . famciclovir (FAMVIR) 250 MG tablet Take 1 tablet (250 mg total) by mouth daily.  . fluticasone (FLONASE) 50 MCG/ACT nasal spray Place 2 sprays into both nostrils as needed.   . meloxicam (MOBIC) 15 MG tablet Take 15 mg by mouth daily.  . mupirocin nasal ointment (BACTROBAN) 2 % Use small amount in each nostril twice daily for five (5) days. After application, press sides of nose together and gently massage.  Marland Kitchen Respiratory Therapy Supplies (FLUTTER) DEVI 1 Device by Does not apply route as directed.  . rosuvastatin (CRESTOR) 10 MG tablet Take 1 tablet (10 mg total) by mouth daily.  . traMADol (ULTRAM) 50 MG tablet Take 1 tablet (50 mg total) by mouth every 8 (eight) hours as needed.  . ALPRAZolam (XANAX) 0.25 MG tablet Take 1 tablet (0.25 mg total) by mouth daily as needed for anxiety. (Patient not taking: No sig reported)  . amLODipine (NORVASC) 5 MG tablet Take 1 tablet (5 mg total) by mouth daily. (Patient not taking: No sig reported)  . fluticasone (FLOVENT HFA) 220 MCG/ACT inhaler Inhale 2 puffs into the lungs 2 (two) times daily. (Patient not taking: No sig reported)  . pantoprazole (PROTONIX) 40 MG tablet Take 1 tablet (40 mg total) by mouth daily. (Patient not taking: Reported on 04/02/2020)   No facility-administered encounter medications on file as of 08/11/2020.    Allergies (verified) Lipitor [atorvastatin]   History: Past  Medical History:  Diagnosis Date  . Hyperlipemia   . Hypertension    Past Surgical History:  Procedure Laterality Date  . CATARACT EXTRACTION    . RETINAL DETACHMENT SURGERY     Family History  Problem Relation Age of Onset  . Heart attack Mother   . Stroke Father   . Hyperlipidemia Father   . Dementia Father    Social History   Socioeconomic History  . Marital status: Married    Spouse name: Not on file   . Number of children: Not on file  . Years of education: Not on file  . Highest education level: Not on file  Occupational History  . Not on file  Tobacco Use  . Smoking status: Never Smoker  . Smokeless tobacco: Never Used  Vaping Use  . Vaping Use: Never used  Substance and Sexual Activity  . Alcohol use: No    Alcohol/week: 0.0 standard drinks  . Drug use: No  . Sexual activity: Yes  Other Topics Concern  . Not on file  Social History Narrative   No living will , no HCPOA. Full code (reviewed 2015, given packet)   Social Determinants of Health   Financial Resource Strain: Low Risk   . Difficulty of Paying Living Expenses: Not hard at all  Food Insecurity: No Food Insecurity  . Worried About Charity fundraiser in the Last Year: Never true  . Ran Out of Food in the Last Year: Never true  Transportation Needs: No Transportation Needs  . Lack of Transportation (Medical): No  . Lack of Transportation (Non-Medical): No  Physical Activity: Inactive  . Days of Exercise per Week: 0 days  . Minutes of Exercise per Session: 0 min  Stress: No Stress Concern Present  . Feeling of Stress : Not at all  Social Connections: Not on file    Tobacco Counseling Counseling given: Not Answered   Clinical Intake:  Pre-visit preparation completed: Yes  Pain : No/denies pain     Nutritional Risks: None Diabetes: No  How often do you need to have someone help you when you read instructions, pamphlets, or other written materials from your doctor or pharmacy?: 1 - Never What is the last grade level you completed in school?: 12th  Diabetic: No Nutrition Risk Assessment:  Has the patient had any N/V/D within the last 2 months?  No  Does the patient have any non-healing wounds?  No  Has the patient had any unintentional weight loss or weight gain?  No   Diabetes:  Is the patient diabetic?  No  If diabetic, was a CBG obtained today?  N/A Did the patient bring in their  glucometer from home?  N/A How often do you monitor your CBG's? N/A.   Financial Strains and Diabetes Management:  Are you having any financial strains with the device, your supplies or your medication? No .  Does the patient want to be seen by Chronic Care Management for management of their diabetes?  No  Would the patient like to be referred to a Nutritionist or for Diabetic Management?  No    Interpreter Needed?: No  Information entered by :: CJohnson, LPN   Activities of Daily Living In your present state of health, do you have any difficulty performing the following activities: 08/11/2020  Hearing? Y  Comment some hearing loss noted  Vision? N  Difficulty concentrating or making decisions? Y  Comment some memory changes noted  Walking or climbing stairs? N  Dressing or bathing? N  Doing errands, shopping? N  Preparing Food and eating ? N  Using the Toilet? N  In the past six months, have you accidently leaked urine? Y  Comment wears pads  Do you have problems with loss of bowel control? N  Managing your Medications? N  Managing your Finances? N  Housekeeping or managing your Housekeeping? N  Some recent data might be hidden    Patient Care Team: Jinny Sanders, MD as PCP - General Owens Loffler, MD as Consulting Physician (Sports Medicine) Ralene Bathe, MD as Consulting Physician (Dermatology) Calvert Cantor, MD as Consulting Physician (Ophthalmology) Rodney Langton., MD as Consulting Physician (Dentistry) Vidal Schwalbe Yvetta Coder, FNP as Nurse Practitioner (Family Medicine)  Indicate any recent Medical Services you may have received from other than [facility] providers in the past year (date may be approximate).     Assessment:   This is a routine wellness examination for [place].  Hearing/Vision screen  Hearing Screening   125Hz  250Hz  500Hz  1000Hz  2000Hz  3000Hz  4000Hz  6000Hz  8000Hz   Right ear:           Left ear:           Vision Screening Comments: Patient  gets annual eye exams   Dietary issues and exercise activities discussed: Current Exercise Habits: The patient does not participate in regular exercise at present, Exercise limited by: None identified  Goals    . Follow up with Primary Care Provider     Starting 07/26/2018, I will continue to take medications as prescribed and to keep appointments with PCP as scheduled.     . Patient Stated     08/02/2019, I will maintain and continue medications as prescribed.     . Patient Stated     08/11/2020, I will maintain and continue medications as prescribed.       Depression Screen PHQ 2/9 Scores 08/11/2020 08/02/2019 07/26/2018 07/18/2017 05/19/2016 05/22/2015 04/03/2014  PHQ - 2 Score 0 0 0 0 0 0 0  PHQ- 9 Score 0 0 0 0 - - -    Fall Risk Fall Risk  08/11/2020 08/02/2019 07/26/2018 07/18/2017 05/19/2016  Falls in the past year? 1 0 1 No Yes  Comment - - fell; multiple fractures to left foot - Emmi Telephone Survey: data to providers prior to load  Number falls in past yr: 0 0 0 - 1  Comment - - - - Emmi Telephone Survey Actual Response = 1  Injury with Fall? 0 0 1 - No  Risk for fall due to : Medication side effect Medication side effect - - -  Follow up Falls evaluation completed;Falls prevention discussed Falls evaluation completed;Falls prevention discussed - - -    FALL RISK PREVENTION PERTAINING TO THE HOME:  Any stairs in or around the home? Yes  If so, are there any without handrails? No  Home free of loose throw rugs in walkways, pet beds, electrical cords, etc? Yes  Adequate lighting in your home to reduce risk of falls? Yes   ASSISTIVE DEVICES UTILIZED TO PREVENT FALLS:  Life alert? No  Use of a cane, walker or w/c? No  Grab bars in the bathroom? No  Shower chair or bench in shower? No  Elevated toilet seat or a handicapped toilet? No   TIMED UP AND GO:  Was the test performed? N/A telephone visit .  Cognitive Function: MMSE - Mini Mental State Exam 08/11/2020 08/02/2019 07/26/2018  07/18/2017 05/19/2016  Orientation to time 5  5 5 5 5   Orientation to Place 5 5 5 5 5   Registration 3 3 3 3 3   Attention/ Calculation 5 5 0 0 0  Recall 3 3 3 3 3   Language- name 2 objects - - 0 0 0  Language- repeat 1 1 1 1 1   Language- follow 3 step command - - 3 3 3   Language- read & follow direction - - 0 0 0  Write a sentence - - 0 0 0  Copy design - - 0 0 0  Total score - - 20 20 20   Mini Cog  Mini-Cog screen was completed. Maximum score is 22. A value of 0 denotes this part of the MMSE was not completed or the patient failed this part of the Mini-Cog screening.       Immunizations Immunization History  Administered Date(s) Administered  . Fluad Quad(high Dose 65+) 03/14/2019, 04/02/2020  . Influenza Split 05/24/2011, 03/01/2013, 04/24/2015  . Influenza Whole 03/27/2009  . Influenza, High Dose Seasonal PF 02/29/2016, 04/07/2017, 05/24/2018  . Influenza,inj,Quad PF,6+ Mos 04/03/2014  . Pneumococcal Conjugate-13 04/03/2014, 04/07/2017  . Pneumococcal Polysaccharide-23 03/27/2009  . Tdap 03/11/2011  . Zoster 06/02/2014    TDAP status: Up to date  Flu Vaccine status: Up to date  Pneumococcal vaccine status: Up to date  Covid-19 vaccine status: Declined, Education has been provided regarding the importance of this vaccine but patient still declined. Advised may receive this vaccine at local pharmacy or Health Dept.or vaccine clinic. Aware to provide a copy of the vaccination record if obtained from local pharmacy or Health Dept. Verbalized acceptance and understanding.  Qualifies for Shingles Vaccine? Yes   Zostavax completed Yes   Shingrix Completed?: No.    Education has been provided regarding the importance of this vaccine. Patient has been advised to call insurance company to determine out of pocket expense if they have not yet received this vaccine. Advised may also receive vaccine at local pharmacy or Health Dept. Verbalized acceptance and understanding.  Screening  Tests Health Maintenance  Topic Date Due  . COVID-19 Vaccine (1) 08/27/2021 (Originally 05/27/1953)  . MAMMOGRAM  09/03/2020  . TETANUS/TDAP  03/10/2021  . INFLUENZA VACCINE  Completed  . DEXA SCAN  Completed  . PNA vac Low Risk Adult  Completed  . HPV VACCINES  Aged Out    Health Maintenance  There are no preventive care reminders to display for this patient.  Colorectal cancer screening: No longer required.   Mammogram status: Completed 09/04/2019. Repeat every year  Bone Density status: Completed 09/04/2019. Results reflect: Bone density results: OSTEOPENIA. Repeat every 2 years.  Lung Cancer Screening: (Low Dose CT Chest recommended if Age 60-80 years, 30 pack-year currently smoking OR have quit w/in 15 years.) does not qualify.    Additional Screening:  Hepatitis C Screening: does not qualify; Completed N/A  Vision Screening: Recommended annual ophthalmology exams for early detection of glaucoma and other disorders of the eye. Is the patient up to date with their annual eye exam?  Yes  Who is the provider or what is the name of the office in which the patient attends annual eye exams? [facility] If pt is not established with a provider, would they like to be referred to a provider to establish care? No .   Dental Screening: Recommended annual dental exams for proper oral hygiene  Community Resource Referral / Chronic Care Management: CRR required this visit?  No   CCM required this visit?  No  Plan:     I have personally reviewed and noted the following in the patient's chart:   . Medical and social history . Use of alcohol, tobacco or illicit drugs  . Current medications and supplements . Functional ability and status . Nutritional status . Physical activity . Advanced directives . List of other physicians . Hospitalizations, surgeries, and ER visits in previous 12 months . Vitals . Screenings to include cognitive, depression, and  falls . Referrals and appointments  In addition, I have reviewed and discussed with patient certain preventive protocols, quality metrics, and best practice recommendations. A written personalized care plan for preventive services as well as general preventive health recommendations were provided to patient.   Due to this being a telephonic visit, the after visit summary with patients personalized plan was offered to patient via office or my-chart. Patient preferred to pick up at office at next visit or via mychart.   Andrez Grime, LPN   1/0/1751

## 2020-08-11 NOTE — Progress Notes (Signed)
PCP notes:  Health Maintenance: Covid- declined   Abnormal Screenings: none   Patient concerns: Pain under right rib cage    Nurse concerns: none   Next PCP appt.: 08/18/2020 @ 9:40 am

## 2020-08-12 ENCOUNTER — Other Ambulatory Visit: Payer: Self-pay

## 2020-08-12 ENCOUNTER — Other Ambulatory Visit (INDEPENDENT_AMBULATORY_CARE_PROVIDER_SITE_OTHER): Payer: Medicare Other

## 2020-08-12 DIAGNOSIS — M8589 Other specified disorders of bone density and structure, multiple sites: Secondary | ICD-10-CM

## 2020-08-12 DIAGNOSIS — E7800 Pure hypercholesterolemia, unspecified: Secondary | ICD-10-CM | POA: Diagnosis not present

## 2020-08-12 LAB — LIPID PANEL
Cholesterol: 177 mg/dL (ref 0–200)
HDL: 54.8 mg/dL (ref >39.00)
LDL Cholesterol: 104 mg/dL — ABNORMAL HIGH (ref 0–99)
NonHDL: 122.02
Total CHOL/HDL Ratio: 3
Triglycerides: 91 mg/dL (ref 0.0–149.0)
VLDL: 18.2 mg/dL (ref 0.0–40.0)

## 2020-08-12 LAB — COMPREHENSIVE METABOLIC PANEL
ALT: 18 U/L (ref 0–35)
AST: 19 U/L (ref 0–37)
Albumin: 4.3 g/dL (ref 3.5–5.2)
Alkaline Phosphatase: 68 U/L (ref 39–117)
BUN: 21 mg/dL (ref 6–23)
CO2: 31 mEq/L (ref 19–32)
Calcium: 9.6 mg/dL (ref 8.4–10.5)
Chloride: 103 mEq/L (ref 96–112)
Creatinine, Ser: 1.07 mg/dL (ref 0.40–1.20)
GFR: 49.16 mL/min — ABNORMAL LOW (ref >60.00)
Glucose, Bld: 93 mg/dL (ref 70–99)
Potassium: 4.4 mEq/L (ref 3.5–5.1)
Sodium: 139 mEq/L (ref 135–145)
Total Bilirubin: 0.5 mg/dL (ref 0.2–1.2)
Total Protein: 7.4 g/dL (ref 6.0–8.3)

## 2020-08-13 LAB — VITAMIN D 25 HYDROXY (VIT D DEFICIENCY, FRACTURES): VITD: 35.9 ng/mL (ref 30.00–100.00)

## 2020-08-13 NOTE — Progress Notes (Signed)
No critical labs need to be addressed urgently. We will discuss labs in detail at upcoming office visit.   

## 2020-08-18 ENCOUNTER — Encounter: Payer: Self-pay | Admitting: Family Medicine

## 2020-08-18 ENCOUNTER — Other Ambulatory Visit: Payer: Self-pay | Admitting: Family Medicine

## 2020-08-18 ENCOUNTER — Other Ambulatory Visit: Payer: Self-pay

## 2020-08-18 ENCOUNTER — Ambulatory Visit (INDEPENDENT_AMBULATORY_CARE_PROVIDER_SITE_OTHER): Payer: Medicare Other | Admitting: Family Medicine

## 2020-08-18 DIAGNOSIS — E7800 Pure hypercholesterolemia, unspecified: Secondary | ICD-10-CM

## 2020-08-18 DIAGNOSIS — I10 Essential (primary) hypertension: Secondary | ICD-10-CM | POA: Diagnosis not present

## 2020-08-18 DIAGNOSIS — Z1231 Encounter for screening mammogram for malignant neoplasm of breast: Secondary | ICD-10-CM

## 2020-08-18 DIAGNOSIS — J849 Interstitial pulmonary disease, unspecified: Secondary | ICD-10-CM

## 2020-08-18 DIAGNOSIS — J479 Bronchiectasis, uncomplicated: Secondary | ICD-10-CM

## 2020-08-18 MED ORDER — VALSARTAN 40 MG PO TABS: 40.0000 mg | ORAL_TABLET | Freq: Every day | ORAL | 3 refills | Status: DC

## 2020-08-18 MED ORDER — FAMCICLOVIR 250 MG PO TABS: 250.0000 mg | ORAL_TABLET | Freq: Every day | ORAL | 3 refills | Status: DC

## 2020-08-18 MED ORDER — ROSUVASTATIN CALCIUM 10 MG PO TABS: 10.0000 mg | ORAL_TABLET | Freq: Every day | ORAL | 3 refills | Status: DC

## 2020-08-18 NOTE — Assessment & Plan Note (Signed)
Follow up with with pulmonary every 6 months.  Stable control.

## 2020-08-18 NOTE — Patient Instructions (Addendum)
Start Diovan daily for BP.  Follow BP at home.Marland Kitchen goal < 150/90.  Please call the location of your choice from the menu below to schedule your Mammogram and/or Bone Density appointment.    [place]   1. [facility]                      Phone:  [phone] [address], [place] [address]                                                             Services: Traditional and 3D Mammogram, Bone Density   2. [place] [facility]                 Phone: [phone] [address], [place] [address]    Service: Bone Density ONLY   *this site does NOT perform mammograms  3. [place]                        Phone:  [phone] [address] [place], [place] [address]                                            Services:  3D Mammogram and Bone Density    [place]  1. [place] [facility]   Phone:  [phone]   [place], [place] [address]                                            Services: 3D Mammogram and Bone Density  2. [place] [facility]  Phone:  [phone]   [address]  [place], [place] [address]                                              Services:  3D Mammogram and Bone Density  

## 2020-08-18 NOTE — Assessment & Plan Note (Signed)
SE of swelling to amlodipine.  Cough with lisinopril  Start diovan 40 mg .. follow BP at home.  Follow up in 2 weeks with [facility].

## 2020-08-18 NOTE — Assessment & Plan Note (Signed)
Stable, chronic.  Continue current medication.   Crestor 10 mg daily 

## 2020-08-18 NOTE — Progress Notes (Signed)
Patient ID: FRANCESA EUGENIO, female    DOB: 1940/10/17, 80 y.o.   MRN: [id number]  This visit was conducted in person.  BP (!) 154/80   Pulse 82   Temp 97.7 F (36.5 C) (Temporal)   Ht 5' 5.5" (1.664 m)   Wt 181 lb 12 oz (82.4 kg)   SpO2 95%   BMI 29.78 kg/m    CC:  Chief Complaint  Patient presents with  . Annual Exam    Part 2    Subjective:   HPI: APHRODITE HARPENAU is a 80 y.o. female presenting on 08/18/2020 for Annual Exam (Part 2)  The patient saw a LPN or RN for medicare wellness visit.  Prevention and wellness was reviewed in detail. Note reviewed and important notes copied below. Health Maintenance: Covid- declined Abnormal Screenings: none  08/18/20  Elevated Cholesterol:  On crestor 10 mg daily Lab Results  Component Value Date   CHOL 177 08/12/2020   HDL 54.80 08/12/2020   LDLCALC 104 (H) 08/12/2020   LDLDIRECT 180.6 04/04/2011   TRIG 91.0 08/12/2020   CHOLHDL 3 08/12/2020  Using medications without problems:none Muscle aches: none Diet compliance: moderate Exercise: walking Other complaints:  ILD, bronchietasis:  followed by pulmonary, Tammy Parrett. And Dr.Kasa  Reviewed last OV note 04/02/20  Using flutter valve, flonase, mucinex BID prn, Flovent 2 puffs twice daily.  PFTs done, no med changes.  Vit D normal range  Hypertension:   was on amlodipine 5 mg daily.. not taking given it caused ankle swelling. BP Readings from Last 3 Encounters:  08/18/20 (!) 154/80  04/02/20 126/82  08/09/19 (!) 146/84  Using medication without problems or lightheadedness: none Chest pain with exertion:none Edema:occ Short of breath: yes, fairly stable Average home BPs: not checking. Other issues:      Relevant past medical, surgical, family and social history reviewed and updated as indicated. Interim medical history since our last visit reviewed. Allergies and medications reviewed and updated. Outpatient Medications Prior to Visit  Medication Sig  Dispense Refill  . acetaminophen (TYLENOL) 500 MG tablet Take 500 mg by mouth every 6 (six) hours as needed.    Marland Kitchen albuterol (PROVENTIL HFA;VENTOLIN HFA) 108 (90 Base) MCG/ACT inhaler Inhale 2 puffs into the lungs every 6 (six) hours as needed for wheezing or shortness of breath. 1 Inhaler 2  . Biotin 10 MG TABS Take 1 tablet by mouth daily.    . cetirizine (ZYRTEC) 10 MG tablet Take 10 mg by mouth daily.    . Cholecalciferol (VITAMIN D3) 50 MCG (2000 UT) TABS Take 1 tablet by mouth daily.    Marland Kitchen CRANBERRY PO Take 1 capsule by mouth daily.    . diclofenac sodium (VOLTAREN) 1 % GEL Apply 4 g topically 4 (four) times daily as needed.    . famciclovir (FAMVIR) 250 MG tablet Take 1 tablet (250 mg total) by mouth daily. 90 tablet 3  . fluticasone (FLONASE) 50 MCG/ACT nasal spray Place 2 sprays into both nostrils as needed.     . fluticasone (FLOVENT HFA) 220 MCG/ACT inhaler Inhale 2 puffs into the lungs 2 (two) times daily. 3 each 1  . meloxicam (MOBIC) 15 MG tablet Take 15 mg by mouth daily.    . Multiple Minerals-Vitamins (CAL-MAG-ZINC-D PO) Take 1 tablet by mouth daily.    . mupirocin nasal ointment (BACTROBAN) 2 % Use small amount in each nostril twice daily for five (5) days. After application, press sides of nose together and gently massage.  10 g 0  . Respiratory Therapy Supplies (FLUTTER) DEVI 1 Device by Does not apply route as directed. 1 each 0  . rosuvastatin (CRESTOR) 10 MG tablet Take 1 tablet (10 mg total) by mouth daily. 90 tablet 3  . vitamin B-12 (CYANOCOBALAMIN) 1000 MCG tablet Take 1,000 mcg by mouth daily.    . vitamin E 180 MG (400 UNITS) capsule Take 400 Units by mouth daily.    Marland Kitchen amLODipine (NORVASC) 5 MG tablet Take 1 tablet (5 mg total) by mouth daily. (Patient not taking: No sig reported) 30 tablet 3  . pantoprazole (PROTONIX) 40 MG tablet Take 1 tablet (40 mg total) by mouth daily. (Patient not taking: No sig reported) 30 tablet 2  . ALPRAZolam (XANAX) 0.25 MG tablet Take 1  tablet (0.25 mg total) by mouth daily as needed for anxiety. (Patient not taking: No sig reported) 20 tablet 0  . Calcium Carbonate-Vitamin D 600-400 MG-UNIT tablet Take 1 tablet by mouth daily.    . traMADol (ULTRAM) 50 MG tablet Take 1 tablet (50 mg total) by mouth every 8 (eight) hours as needed. 15 tablet 0   No facility-administered medications prior to visit.     Per HPI unless specifically indicated in ROS section below Review of Systems  Constitutional: Negative for fatigue and fever.  HENT: Negative for congestion.   Eyes: Negative for pain.  Respiratory: Negative for cough and shortness of breath.   Cardiovascular: Negative for chest pain, palpitations and leg swelling.  Gastrointestinal: Negative for abdominal pain and blood in stool.  Genitourinary: Negative for dysuria and vaginal bleeding.  Musculoskeletal: Positive for arthralgias. Negative for back pain.  Neurological: Negative for syncope, light-headedness and headaches.  Psychiatric/Behavioral: Negative for dysphoric mood.   Objective:  BP (!) 154/80   Pulse 82   Temp 97.7 F (36.5 C) (Temporal)   Ht 5' 5.5" (1.664 m)   Wt 181 lb 12 oz (82.4 kg)   SpO2 95%   BMI 29.78 kg/m   Wt Readings from Last 3 Encounters:  08/18/20 181 lb 12 oz (82.4 kg)  04/02/20 178 lb (80.7 kg)  08/09/19 175 lb (79.4 kg)      Physical Exam Constitutional:      General: She is not in acute distress.Vital signs are normal.     Appearance: Normal appearance. She is well-developed and well-nourished. She is not ill-appearing or toxic-appearing.  HENT:     Head: Normocephalic.     Right Ear: Hearing, tympanic membrane, ear canal and external ear normal.     Left Ear: Hearing, tympanic membrane, ear canal and external ear normal.     Nose: Nose normal.  Eyes:     General: Lids are normal. Lids are everted, no foreign bodies appreciated.     Extraocular Movements: EOM normal.     Conjunctiva/sclera: Conjunctivae normal.     Pupils:  Pupils are equal, round, and reactive to light.  Neck:     Thyroid: No thyroid mass or thyromegaly.     Vascular: No carotid bruit.     Trachea: Trachea normal.  Cardiovascular:     Rate and Rhythm: Normal rate and regular rhythm.     Pulses: Intact distal pulses.     Heart sounds: Normal heart sounds, S1 normal and S2 normal. No murmur heard. No gallop.   Pulmonary:     Effort: Pulmonary effort is normal. No respiratory distress.     Breath sounds: Normal breath sounds. No wheezing, rhonchi or rales.  Abdominal:  General: Bowel sounds are normal. There is no distension or abdominal bruit.     Palpations: Abdomen is soft. There is no fluid wave, hepatosplenomegaly or mass.     Tenderness: There is no abdominal tenderness. There is no CVA tenderness, guarding or rebound.     Hernia: No hernia is present.  Musculoskeletal:     Cervical back: Normal range of motion and neck supple.  Lymphadenopathy:     Cervical: No cervical adenopathy.     Upper Body:  No axillary adenopathy present. Skin:    General: Skin is warm, dry and intact.     Findings: No rash.  Neurological:     Mental Status: She is alert.     Cranial Nerves: No cranial nerve deficit.     Sensory: No sensory deficit.     Deep Tendon Reflexes: Strength normal.  Psychiatric:        Mood and Affect: Mood is not anxious or depressed.        Speech: Speech normal.        Behavior: Behavior normal. Behavior is cooperative.        Cognition and Memory: Cognition and memory normal.        Judgment: Judgment normal.       Results for orders placed or performed in visit on 08/12/20  VITAMIN D 25 Hydroxy (Vit-D Deficiency, Fractures)  Result Value Ref Range   VITD 35.90 30.00 - 100.00 ng/mL  Comprehensive metabolic panel  Result Value Ref Range   Sodium 139 135 - 145 mEq/L   Potassium 4.4 3.5 - 5.1 mEq/L   Chloride 103 96 - 112 mEq/L   CO2 31 19 - 32 mEq/L   Glucose, Bld 93 70 - 99 mg/dL   BUN 21 6 - 23 mg/dL    Creatinine, Ser 1.07 0.40 - 1.20 mg/dL   Total Bilirubin 0.5 0.2 - 1.2 mg/dL   Alkaline Phosphatase 68 39 - 117 U/L   AST 19 0 - 37 U/L   ALT 18 0 - 35 U/L   Total Protein 7.4 6.0 - 8.3 g/dL   Albumin 4.3 3.5 - 5.2 g/dL   GFR 49.16 (L) >60.00 mL/min   Calcium 9.6 8.4 - 10.5 mg/dL  Lipid panel  Result Value Ref Range   Cholesterol 177 0 - 200 mg/dL   Triglycerides 91.0 0.0 - 149.0 mg/dL   HDL 54.80 >39.00 mg/dL   VLDL 18.2 0.0 - 40.0 mg/dL   LDL Cholesterol 104 (H) 0 - 99 mg/dL   Total CHOL/HDL Ratio 3    NonHDL 122.02     This visit occurred during the SARS-CoV-2 public health emergency.  Safety protocols were in place, including screening questions prior to the visit, additional usage of staff PPE, and extensive cleaning of exam room while observing appropriate contact time as indicated for disinfecting solutions.   COVID 19 screen:  No recent travel or known exposure to COVID19 The patient denies respiratory symptoms of COVID 19 at this time. The importance of social distancing was discussed today.   Assessment and Plan   The patient's preventative maintenance and recommended screening tests for an annual wellness exam were reviewed in full today. Brought up to date unless services declined.  Counselled on the importance of diet, exercise, and its role in overall health and mortality. The patient's FH and SH was reviewed, including their home life, tobacco status, and drug and alcohol status.   Vaccines: Uptodate with flu, zoster, PCV 23, 13, Tdap. Refused  COVID. No pap indicated/DVE 2014. No cancer in family. No vaginal bleeding. STOP DVE. Mammogram:3/2021nml, plan yearly to every 2 years DXA: osteopenia 08/2019, nml vit D, plan repeat in 5 years. Colon cancer screen: cologuard 2018.. no further indicated.  Problem List Items Addressed This Visit    Bronchiectasis without complication ([place])    Follow up with with pulmonary every 6 months.  Stable control.       HYPERCHOLESTEROLEMIA    Stable, chronic.  Continue current medication.   Crestor 10 mg daily.      Relevant Medications   valsartan (DIOVAN) 40 MG tablet   rosuvastatin (CRESTOR) 10 MG tablet   Hypertension    SE of swelling to amlodipine.  Cough with lisinopril  Start diovan 40 mg .. follow BP at home.  Follow up in 2 weeks with BMET.      Relevant Medications   valsartan (DIOVAN) 40 MG tablet   rosuvastatin (CRESTOR) 10 MG tablet   ILD (interstitial lung disease) ([place])    Follow up with with pulmonary every 6 months.  Stable control.           Eliezer Lofts, MD

## 2020-09-01 ENCOUNTER — Encounter: Payer: Self-pay | Admitting: Family Medicine

## 2020-09-01 ENCOUNTER — Ambulatory Visit (INDEPENDENT_AMBULATORY_CARE_PROVIDER_SITE_OTHER): Payer: Medicare Other | Admitting: Family Medicine

## 2020-09-01 ENCOUNTER — Other Ambulatory Visit: Payer: Self-pay

## 2020-09-01 VITALS — BP 150/70 | HR 78 | Temp 98.2°F | Ht 65.5 in | Wt 181.5 lb

## 2020-09-01 DIAGNOSIS — I10 Essential (primary) hypertension: Secondary | ICD-10-CM | POA: Diagnosis not present

## 2020-09-01 LAB — BASIC METABOLIC PANEL
BUN: 24 mg/dL — ABNORMAL HIGH (ref 6–23)
CO2: 29 mEq/L (ref 19–32)
Calcium: 9.3 mg/dL (ref 8.4–10.5)
Chloride: 103 mEq/L (ref 96–112)
Creatinine, Ser: 1.11 mg/dL (ref 0.40–1.20)
GFR: 47.02 mL/min — ABNORMAL LOW (ref >60.00)
Glucose, Bld: 105 mg/dL — ABNORMAL HIGH (ref 70–99)
Potassium: 4.7 mEq/L (ref 3.5–5.1)
Sodium: 138 mEq/L (ref 135–145)

## 2020-09-01 NOTE — Assessment & Plan Note (Signed)
Improved control on low dose diovan... check BMET.  Follow BPs at home.Marland Kitchen goal < 150/90... if not at goal over next few days at home consider increase in dose.

## 2020-09-01 NOTE — Progress Notes (Signed)
Patient ID: Patty Shelton, female    DOB: 08-Sep-1940, 80 y.o.   MRN: [id number]  This visit was conducted in person.  BP (!) 150/70   Pulse 78   Temp 98.2 F (36.8 C) (Temporal)   Ht 5' 5.5" (1.664 m)   Wt 181 lb 8 oz (82.3 kg)   SpO2 94%   BMI 29.74 kg/m    CC:  Chief Complaint  Patient presents with  . Follow-up    HTN/Needs BMET-only been on new BP med x 1 week    Subjective:   HPI: Patty Shelton is a 80 y.o. female presenting on 09/01/2020 for Follow-up (HTN/Needs BMET-only been on new BP med x 1 week)  HTN: At last OV BP elevated as had to stop amlodipine given SE of ankle swelling  Cough with lisinopril Started on  Diovan 40 mg daily.  No SE.  She has not been checking at home.  BP Readings from Last 3 Encounters:  09/01/20 (!) 150/70  08/18/20 (!) 154/80  04/02/20 126/82       Relevant past medical, surgical, family and social history reviewed and updated as indicated. Interim medical history since our last visit reviewed. Allergies and medications reviewed and updated. Outpatient Medications Prior to Visit  Medication Sig Dispense Refill  . acetaminophen (TYLENOL) 500 MG tablet Take 500 mg by mouth every 6 (six) hours as needed.    Marland Kitchen albuterol (PROVENTIL HFA;VENTOLIN HFA) 108 (90 Base) MCG/ACT inhaler Inhale 2 puffs into the lungs every 6 (six) hours as needed for wheezing or shortness of breath. 1 Inhaler 2  . Biotin 10 MG TABS Take 1 tablet by mouth daily.    . cetirizine (ZYRTEC) 10 MG tablet Take 10 mg by mouth daily.    . Cholecalciferol (VITAMIN D3) 50 MCG (2000 UT) TABS Take 1 tablet by mouth daily.    Marland Kitchen CRANBERRY PO Take 1 capsule by mouth daily.    . diclofenac sodium (VOLTAREN) 1 % GEL Apply 4 g topically 4 (four) times daily as needed.    . famciclovir (FAMVIR) 250 MG tablet Take 1 tablet (250 mg total) by mouth daily. 90 tablet 3  . fluticasone (FLONASE) 50 MCG/ACT nasal spray Place 2 sprays into both nostrils as needed.     . fluticasone  (FLOVENT HFA) 220 MCG/ACT inhaler Inhale 2 puffs into the lungs 2 (two) times daily. 3 each 1  . meloxicam (MOBIC) 15 MG tablet Take 15 mg by mouth daily.    . Multiple Minerals-Vitamins (CAL-MAG-ZINC-D PO) Take 1 tablet by mouth daily.    . mupirocin nasal ointment (BACTROBAN) 2 % Use small amount in each nostril twice daily for five (5) days. After application, press sides of nose together and gently massage. 10 g 0  . Respiratory Therapy Supplies (FLUTTER) DEVI 1 Device by Does not apply route as directed. 1 each 0  . rosuvastatin (CRESTOR) 10 MG tablet Take 1 tablet (10 mg total) by mouth daily. 90 tablet 3  . valsartan (DIOVAN) 40 MG tablet Take 1 tablet (40 mg total) by mouth daily. 90 tablet 3  . vitamin B-12 (CYANOCOBALAMIN) 1000 MCG tablet Take 1,000 mcg by mouth daily.    . vitamin E 180 MG (400 UNITS) capsule Take 400 Units by mouth daily.    . pantoprazole (PROTONIX) 40 MG tablet Take 1 tablet (40 mg total) by mouth daily. (Patient not taking: No sig reported) 30 tablet 2   No facility-administered medications prior to visit.  Per HPI unless specifically indicated in ROS section below Review of Systems  Constitutional: Negative for fatigue and fever.  HENT: Negative for ear pain.   Eyes: Negative for pain.  Respiratory: Negative for chest tightness and shortness of breath.   Cardiovascular: Negative for chest pain, palpitations and leg swelling.  Gastrointestinal: Negative for abdominal pain.  Genitourinary: Negative for dysuria.   Objective:  BP (!) 150/70   Pulse 78   Temp 98.2 F (36.8 C) (Temporal)   Ht 5' 5.5" (1.664 m)   Wt 181 lb 8 oz (82.3 kg)   SpO2 94%   BMI 29.74 kg/m   Wt Readings from Last 3 Encounters:  09/01/20 181 lb 8 oz (82.3 kg)  08/18/20 181 lb 12 oz (82.4 kg)  04/02/20 178 lb (80.7 kg)      Physical Exam Constitutional:      General: She is not in acute distress.    Appearance: Normal appearance. She is well-developed. She is not  ill-appearing or toxic-appearing.  HENT:     Head: Normocephalic.     Right Ear: Hearing, tympanic membrane, ear canal and external ear normal. Tympanic membrane is not erythematous, retracted or bulging.     Left Ear: Hearing, tympanic membrane, ear canal and external ear normal. Tympanic membrane is not erythematous, retracted or bulging.     Nose: No mucosal edema or rhinorrhea.     Right Sinus: No maxillary sinus tenderness or frontal sinus tenderness.     Left Sinus: No maxillary sinus tenderness or frontal sinus tenderness.     Mouth/Throat:     Pharynx: Uvula midline.  Eyes:     General: Lids are normal. Lids are everted, no foreign bodies appreciated.     Conjunctiva/sclera: Conjunctivae normal.     Pupils: Pupils are equal, round, and reactive to light.  Neck:     Thyroid: No thyroid mass or thyromegaly.     Vascular: No carotid bruit.     Trachea: Trachea normal.  Cardiovascular:     Rate and Rhythm: Normal rate and regular rhythm.     Pulses: Normal pulses.     Heart sounds: Normal heart sounds, S1 normal and S2 normal. No murmur heard. No friction rub. No gallop.   Pulmonary:     Effort: Pulmonary effort is normal. No tachypnea or respiratory distress.     Breath sounds: Normal breath sounds. No decreased breath sounds, wheezing, rhonchi or rales.  Abdominal:     General: Bowel sounds are normal.     Palpations: Abdomen is soft.     Tenderness: There is no abdominal tenderness.  Musculoskeletal:     Cervical back: Normal range of motion and neck supple.  Skin:    General: Skin is warm and dry.     Findings: No rash.  Neurological:     Mental Status: She is alert.  Psychiatric:        Mood and Affect: Mood is not anxious or depressed.        Speech: Speech normal.        Behavior: Behavior normal. Behavior is cooperative.        Thought Content: Thought content normal.        Judgment: Judgment normal.       Results for orders placed or performed in visit on  08/12/20  VITAMIN D 25 Hydroxy (Vit-D Deficiency, Fractures)  Result Value Ref Range   VITD 35.90 30.00 - 100.00 ng/mL  Comprehensive metabolic panel  Result Value Ref  Range   Sodium 139 135 - 145 mEq/L   Potassium 4.4 3.5 - 5.1 mEq/L   Chloride 103 96 - 112 mEq/L   CO2 31 19 - 32 mEq/L   Glucose, Bld 93 70 - 99 mg/dL   BUN 21 6 - 23 mg/dL   Creatinine, Ser 1.07 0.40 - 1.20 mg/dL   Total Bilirubin 0.5 0.2 - 1.2 mg/dL   Alkaline Phosphatase 68 39 - 117 U/L   AST 19 0 - 37 U/L   ALT 18 0 - 35 U/L   Total Protein 7.4 6.0 - 8.3 g/dL   Albumin 4.3 3.5 - 5.2 g/dL   GFR 49.16 (L) >60.00 mL/min   Calcium 9.6 8.4 - 10.5 mg/dL  Lipid panel  Result Value Ref Range   Cholesterol 177 0 - 200 mg/dL   Triglycerides 91.0 0.0 - 149.0 mg/dL   HDL 54.80 >39.00 mg/dL   VLDL 18.2 0.0 - 40.0 mg/dL   LDL Cholesterol 104 (H) 0 - 99 mg/dL   Total CHOL/HDL Ratio 3    NonHDL 122.02     This visit occurred during the SARS-CoV-2 public health emergency.  Safety protocols were in place, including screening questions prior to the visit, additional usage of staff PPE, and extensive cleaning of exam room while observing appropriate contact time as indicated for disinfecting solutions.   COVID 19 screen:  No recent travel or known exposure to COVID19 The patient denies respiratory symptoms of COVID 19 at this time. The importance of social distancing was discussed today.   Assessment and Plan    Problem List Items Addressed This Visit    Primary hypertension - Primary    Improved control on low dose diovan... check BMET.  Follow BPs at home.Marland Kitchen goal < 150/90... if not at goal over next few days at home consider increase in dose.      Relevant Orders   Basic Metabolic Panel       Eliezer Lofts, MD

## 2020-09-01 NOTE — Patient Instructions (Signed)
Please stop at the lab to have labs drawn.  Check BP in few days.. call on Friday with measurements..  BP goal < 150/90.

## 2020-09-03 ENCOUNTER — Ambulatory Visit: Payer: TRICARE For Life (TFL) | Admitting: Dermatology

## 2020-09-04 ENCOUNTER — Other Ambulatory Visit: Payer: Self-pay | Admitting: Family Medicine

## 2020-09-04 MED ORDER — VALSARTAN 40 MG PO TABS: 40.0000 mg | ORAL_TABLET | Freq: Two times a day (BID) | ORAL | 11 refills | Status: DC

## 2020-09-04 NOTE — Telephone Encounter (Signed)
Increase diovan to 80 mg daily.. can take 2 tabs of 40 mg... okay to refill at high dose if needed.. # 30 11 RF

## 2020-09-04 NOTE — Telephone Encounter (Signed)
Called and updated patient of Dr. Rometta Emery recommendations. Patient verbalized understanding.

## 2020-09-04 NOTE — Telephone Encounter (Signed)
Pt called in wanted to let Dr. Diona Browner know about her blood pressure reading. It was  146/82- 3/25  and 163/81(this morning).

## 2020-09-08 ENCOUNTER — Ambulatory Visit
Admission: RE | Admit: 2020-09-08 | Discharge: 2020-09-08 | Disposition: A | Discharge status: Home / Self Care | Payer: Medicare Other | Source: Ambulatory Visit | Attending: Family Medicine | Admitting: Family Medicine

## 2020-09-08 ENCOUNTER — Other Ambulatory Visit: Payer: Self-pay

## 2020-09-08 DIAGNOSIS — Z1231 Encounter for screening mammogram for malignant neoplasm of breast: Secondary | ICD-10-CM | POA: Diagnosis not present

## 2020-09-09 ENCOUNTER — Other Ambulatory Visit: Payer: Self-pay | Admitting: Family Medicine

## 2020-09-09 DIAGNOSIS — N6489 Other specified disorders of breast: Secondary | ICD-10-CM

## 2020-09-09 DIAGNOSIS — R928 Other abnormal and inconclusive findings on diagnostic imaging of breast: Secondary | ICD-10-CM

## 2020-09-15 NOTE — Progress Notes (Signed)
Please sign orders.

## 2020-09-22 ENCOUNTER — Telehealth: Payer: Self-pay

## 2020-09-22 NOTE — Telephone Encounter (Signed)
Patient is aware of date/time of covid test.   

## 2020-09-25 ENCOUNTER — Other Ambulatory Visit
Admission: RE | Admit: 2020-09-25 | Discharge: 2020-09-25 | Disposition: A | Discharge status: Home / Self Care | Payer: Medicare Other | Source: Ambulatory Visit | Attending: Internal Medicine | Admitting: Internal Medicine

## 2020-09-25 ENCOUNTER — Other Ambulatory Visit: Payer: Self-pay

## 2020-09-25 DIAGNOSIS — Z20822 Contact with and (suspected) exposure to covid-19: Secondary | ICD-10-CM | POA: Diagnosis not present

## 2020-09-25 DIAGNOSIS — Z01812 Encounter for preprocedural laboratory examination: Secondary | ICD-10-CM | POA: Diagnosis not present

## 2020-09-25 LAB — SARS CORONAVIRUS 2 (TAT 6-24 HRS): SARS Coronavirus 2: NEGATIVE

## 2020-09-28 ENCOUNTER — Ambulatory Visit: Payer: Medicare Other | Attending: Adult Health

## 2020-09-28 ENCOUNTER — Other Ambulatory Visit: Payer: Self-pay

## 2020-09-28 DIAGNOSIS — J479 Bronchiectasis, uncomplicated: Secondary | ICD-10-CM | POA: Insufficient documentation

## 2020-09-28 DIAGNOSIS — J849 Interstitial pulmonary disease, unspecified: Secondary | ICD-10-CM | POA: Diagnosis not present

## 2020-09-28 MED ORDER — ALBUTEROL SULFATE (2.5 MG/3ML) 0.083% IN NEBU
2.5000 mg | INHALATION_SOLUTION | Freq: Once | RESPIRATORY_TRACT | Status: AC
  Administered 2020-09-28: 2.5 mg via RESPIRATORY_TRACT
  Filled 2020-09-28: qty 3

## 2020-10-08 ENCOUNTER — Encounter: Payer: Self-pay | Admitting: Internal Medicine

## 2020-10-08 ENCOUNTER — Ambulatory Visit (INDEPENDENT_AMBULATORY_CARE_PROVIDER_SITE_OTHER): Payer: Medicare Other | Admitting: Internal Medicine

## 2020-10-08 ENCOUNTER — Other Ambulatory Visit: Payer: Self-pay

## 2020-10-08 VITALS — BP 150/68 | HR 78 | Temp 97.3°F | Ht 64.33 in | Wt 179.6 lb

## 2020-10-08 DIAGNOSIS — J479 Bronchiectasis, uncomplicated: Secondary | ICD-10-CM | POA: Diagnosis not present

## 2020-10-08 MED ORDER — ARNUITY ELLIPTA 100 MCG/ACT IN AEPB: 1.0000 | INHALATION_SPRAY | Freq: Two times a day (BID) | RESPIRATORY_TRACT | 10 refills | Status: DC

## 2020-10-08 NOTE — Patient Instructions (Signed)
Continue ARNUITY Please use Flutter valve more often

## 2020-10-08 NOTE — Progress Notes (Signed)
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 No frank honeycombing. No definite progression since 11/27/2016   04/02/2020 Follow up : Bronchiectasis and ILD Patient presents for a 1 year follow-up.  Patient has underlying mild bronchiectasis and ILD.  She is a never smoker.  She says since last visit she is doing well with no flare of cough or wheezing.  Has not been on antibiotics or steroids over the last year.  She says her activity level as at baseline.  She does not do any formal exercise but is very active at home.  Does her own housework.  Is independent and drives.  She has not on oxygen.  She declines the Covid vaccines and says she absolutely will not get this.  She does request a flu shot. Previous high-resolution CT chest in April 2020 showed stable ILD and mild bronchiectasis with no progression since 2018. She is supposed to be using the flutter valve but says that she is not using this.  She is on Flovent.  But says she only uses this as needed. She says she has a cough that has been at baseline.  Is minimally productive.    CHIEF COMPLAINT:    Follow-up bronchiectasis Follow-up chronic bronchitis   HISTORY OF PRESENT ILLNESS   Patient has intermittent coughing significantly improved since last office visit Chronic productive cough in the morning consistent with chronic bronchitis and bronchiectasis Symptoms resolved with Arnuity as needed  No signs of infection at this time Arnuity is has helped significantly in the past so we will represcribe this  Non Smoker Patient is noncompliant with flutter valve Recommend 10-15 times per day      No exacerbation at this time No evidence of heart failure at this time No  evidence or signs of infection at this time No respiratory distress No fevers, chills, nausea, vomiting, diarrhea No evidence of lower extremity edema No evidence hemoptysis    Past Medical History:  Diagnosis Date  . Hyperlipemia   . Hypertension     Social History   Tobacco Use  . Smoking status: Never Smoker  . Smokeless tobacco: Never Used  Vaping Use  . Vaping Use: Never used  Substance Use Topics  . Alcohol use: No    Alcohol/week: 0.0 standard drinks  . Drug use: No     MEDICATIONS    Home Medication:  Current Outpatient Rx  . Order #: [id number] Class: Historical Med  . Order #: [id number] Class: Print  . Order #: [id number] Class: Historical Med  . Order #: [id number] Class: Historical Med  . Order #: [id number] Class: Historical Med  . Order #: [id number] Class: Historical Med  . Order #: [id number] Class: Historical Med  . Order #: [id number] Class: Normal  . Order #: [id number] Class: Historical Med  . Order #: [id number] Class: Normal  . Order #: [id number] Class: Historical Med  . Order #: [id number] Class: Historical Med  . Order #: [id number] Class: Normal  . Order #: [id number] Class: Print  . Order #: [id number] Class: Normal  . Order #: [id number] Class: Normal  . Order #: [id number] Class: Historical Med  . Order #: [id number] Class: Historical Med  . Order #: [id number] Class: Normal    Current Medication:  Current Outpatient Medications:  .  acetaminophen (TYLENOL) 500 MG tablet, Take 500 mg by mouth every 6 (six) hours as needed.,  Disp: , Rfl:  .  albuterol (PROVENTIL HFA;VENTOLIN HFA) 108 (90 Base) MCG/ACT inhaler, Inhale 2 puffs into the lungs every 6 (six) hours as needed for wheezing or shortness of breath., Disp: 1 Inhaler, Rfl: 2 .  Biotin 10 MG TABS, Take 1 tablet by mouth daily., Disp: , Rfl:  .  cetirizine (ZYRTEC) 10 MG tablet, Take 10 mg by mouth daily., Disp: , Rfl:  .  Cholecalciferol (VITAMIN D3) 50 MCG (2000 UT) TABS, Take 1 tablet by mouth daily., Disp: ,  Rfl:  .  CRANBERRY PO, Take 1 capsule by mouth daily., Disp: , Rfl:  .  diclofenac sodium (VOLTAREN) 1 % GEL, Apply 4 g topically 4 (four) times daily as needed., Disp: , Rfl:  .  famciclovir (FAMVIR) 250 MG tablet, Take 1 tablet (250 mg total) by mouth daily., Disp: 90 tablet, Rfl: 3 .  fluticasone (FLONASE) 50 MCG/ACT nasal spray, Place 2 sprays into both nostrils as needed. , Disp: , Rfl:  .  fluticasone (FLOVENT HFA) 220 MCG/ACT inhaler, Inhale 2 puffs into the lungs 2 (two) times daily., Disp: 3 each, Rfl: 1 .  meloxicam (MOBIC) 15 MG tablet, Take 15 mg by mouth daily., Disp: , Rfl:  .  Multiple Minerals-Vitamins (CAL-MAG-ZINC-D PO), Take 1 tablet by mouth daily., Disp: , Rfl:  .  mupirocin nasal ointment (BACTROBAN) 2 %, Use small amount in each nostril twice daily for five (5) days. After application, press sides of nose together and gently massage., Disp: 10 g, Rfl: 0 .  Respiratory Therapy Supplies (FLUTTER) DEVI, 1 Device by Does not apply route as directed., Disp: 1 each, Rfl: 0 .  rosuvastatin (CRESTOR) 10 MG tablet, Take 1 tablet (10 mg total) by mouth daily., Disp: 90 tablet, Rfl: 3 .  valsartan (DIOVAN) 40 MG tablet, Take 1 tablet (40 mg total) by mouth 2 (two) times daily., Disp: 30 tablet, Rfl: 11 .  vitamin B-12 (CYANOCOBALAMIN) 1000 MCG tablet, Take 1,000 mcg by mouth daily., Disp: , Rfl:  .  vitamin E 180 MG (400 UNITS) capsule, Take 400 Units by mouth daily., Disp: , Rfl:  .  pantoprazole (PROTONIX) 40 MG tablet, Take 1 tablet (40 mg total) by mouth daily. (Patient not taking: No sig reported), Disp: 30 tablet, Rfl: 2    ALLERGIES   Lipitor [atorvastatin] and Amlodipine besylate     REVIEW OF SYSTEMS     Review of Systems:  Gen:  Denies  fever, sweats, chills weight loss  HEENT: Denies blurred vision, double vision, ear pain, eye pain, hearing loss, nose bleeds, sore throat Cardiac:  No dizziness, chest pain or heaviness, chest tightness,edema, No JVD Resp:   No  cough, -sputum production, -shortness of breath,-wheezing, -hemoptysis,  Gi: Denies swallowing difficulty, stomach pain, nausea or vomiting, diarrhea, constipation, Other:  All other systems negative   BP (!) 150/68 (BP Location: Left Arm, Patient Position: Sitting, Cuff Size: Normal)   Pulse 78   Temp (!) 97.3 F (36.3 C) (Temporal)   Ht 5' 4.33" (1.634 m)   Wt 179 lb 9.6 oz (81.5 kg)   SpO2 97%   BMI 30.51 kg/m   Physical Examination:   General Appearance: No distress  Neuro:without focal findings,  speech normal,  HEENT: PERRLA, EOM intact.   Pulmonary: normal breath sounds, No wheezing.  CardiovascularNormal S1,S2.  No m/r/g.   Abdomen: Benign, Soft, non-tender. Renal:  No costovertebral tenderness  GU:  Not performed at this time. Endoc: No evident thyromegaly Skin:  warm, no rashes, no ecchymosis  Extremities: normal, no cyanosis, clubbing. PSYCHIATRIC: Mood, affect within normal limits.   ALL OTHER ROS ARE NEGATIVE         IMAGING    I have Independently reviewed images of  CT chest 10/10/18 Interpretation: minimal b/l lower lobe predominant peripheral  ILD consider NSIP mild bronchiectasis No significant changes since 2018     I have Independently reviewed images of  CT chest 11/2016   Interpretation:minimal b/l lower lobe predominant peripheral  ILD consider NSIP mild bronchiectasis      ASSESSMENT/PLAN   80 year old pleasant white female seen today for follow-up chronic productive cough with chronic bronchitis in the setting of chronic allergic rhinitis with GERD in the setting of bronchiectasis with 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 Importance of hand Hygiene and Social Distancing   MEDICATION ADJUSTMENTS/LABS AND TESTS ORDERED: Continue ARNUITY RECOMMEND FLUTTER VALVE USAGE MORE FREQUENTLY  CURRENT MEDICATIONS REVIEWED AT LENGTH WITH PATIENT TODAY    Follow up 1 year  Total time spent 23 minutes  Shevaun Lovan Patricia Pesa, M.D.  [facility]  Medical Director [place] Director [facility] Cardio-Pulmonary Department

## 2020-10-14 ENCOUNTER — Telehealth: Payer: Self-pay | Admitting: Internal Medicine

## 2020-10-14 DIAGNOSIS — J479 Bronchiectasis, uncomplicated: Secondary | ICD-10-CM

## 2020-10-14 MED ORDER — ARNUITY ELLIPTA 100 MCG/ACT IN AEPB: 1.0000 | INHALATION_SPRAY | Freq: Every day | RESPIRATORY_TRACT | 3 refills | Status: DC

## 2020-10-14 NOTE — Telephone Encounter (Signed)
Spoke to patient, who is requesting a 90 day supply of Arnuity. Rx has been sent to preferred pharmacy.  Nothing further needed at this time.

## 2020-10-28 ENCOUNTER — Other Ambulatory Visit: Payer: Self-pay

## 2020-10-28 ENCOUNTER — Ambulatory Visit (INDEPENDENT_AMBULATORY_CARE_PROVIDER_SITE_OTHER): Payer: Medicare Other | Admitting: Dermatology

## 2020-10-28 DIAGNOSIS — D229 Melanocytic nevi, unspecified: Secondary | ICD-10-CM | POA: Diagnosis not present

## 2020-10-28 DIAGNOSIS — Z1283 Encounter for screening for malignant neoplasm of skin: Secondary | ICD-10-CM

## 2020-10-28 DIAGNOSIS — L821 Other seborrheic keratosis: Secondary | ICD-10-CM

## 2020-10-28 DIAGNOSIS — L578 Other skin changes due to chronic exposure to nonionizing radiation: Secondary | ICD-10-CM

## 2020-10-28 DIAGNOSIS — L814 Other melanin hyperpigmentation: Secondary | ICD-10-CM | POA: Diagnosis not present

## 2020-10-28 DIAGNOSIS — I8393 Asymptomatic varicose veins of bilateral lower extremities: Secondary | ICD-10-CM

## 2020-10-28 DIAGNOSIS — D1800 Hemangioma unspecified site: Secondary | ICD-10-CM | POA: Diagnosis not present

## 2020-10-28 NOTE — Patient Instructions (Signed)

## 2020-10-28 NOTE — Progress Notes (Signed)
   Follow-Up Visit   Subjective  Patty Shelton is a 80 y.o. female who presents for the following: Annual Exam (No history of skin cancer or abnormal moles - TBSE today). The patient presents for Total-Body Skin Exam (TBSE) for skin cancer screening and mole check.  The following portions of the chart were reviewed this encounter and updated as appropriate:   Tobacco  Allergies  Meds  Problems  Med Hx  Surg Hx  Fam Hx     Review of Systems:  No other skin or systemic complaints except as noted in HPI or Assessment and Plan.  Objective  Well appearing patient in no apparent distress; mood and affect are within normal limits.  A full examination was performed including scalp, head, eyes, ears, nose, lips, neck, chest, axillae, abdomen, back, buttocks, bilateral upper extremities, bilateral lower extremities, hands, feet, fingers, toes, fingernails, and toenails. All findings within normal limits unless otherwise noted below.  Objective  bilateral lower legs: Spider veins   Assessment & Plan    Lentigines - Scattered tan macules - Due to sun exposure - Benign-appering, observe - Recommend daily broad spectrum sunscreen SPF 30+ to sun-exposed areas, reapply every 2 hours as needed. - Call for any changes  Seborrheic Keratoses - Stuck-on, waxy, tan-brown papules and/or plaques  - Benign-appearing - Discussed benign etiology and prognosis. - Observe - Call for any changes  Melanocytic Nevi - Tan-brown and/or pink-flesh-colored symmetric macules and papules - Benign appearing on exam today - Observation - Call clinic for new or changing moles - Recommend daily use of broad spectrum spf 30+ sunscreen to sun-exposed areas.   Hemangiomas - Red papules - Discussed benign nature - Observe - Call for any changes  Actinic Damage - Chronic condition, secondary to cumulative UV/sun exposure - diffuse scaly erythematous macules with underlying dyspigmentation - Recommend  daily broad spectrum sunscreen SPF 30+ to sun-exposed areas, reapply every 2 hours as needed.  - Staying in the shade or wearing long sleeves, sun glasses (UVA+UVB protection) and wide brim hats (4-inch brim around the entire circumference of the hat) are also recommended for sun protection.  - Call for new or changing lesions.  Skin cancer screening performed today.  Spider veins of both lower extremities bilateral lower legs  Benign, observe. Discussed sclerotherapy. Advised patient fee is $350/treatment and is not covered by insurance. If they are bothersome or painful, she can be evaluated by vascular surgeon.   Skin cancer screening  Return in about 1 year (around 10/28/2021).   I, Ashok Cordia, CMA, am acting as scribe for Sarina Ser, MD .  Documentation: I have reviewed the above documentation for accuracy and completeness, and I agree with the above.  Sarina Ser, MD

## 2020-11-06 ENCOUNTER — Encounter: Payer: Self-pay | Admitting: Dermatology

## 2020-12-18 ENCOUNTER — Other Ambulatory Visit: Payer: Self-pay

## 2020-12-18 ENCOUNTER — Encounter: Payer: Self-pay | Admitting: Family Medicine

## 2020-12-18 ENCOUNTER — Ambulatory Visit (INDEPENDENT_AMBULATORY_CARE_PROVIDER_SITE_OTHER): Payer: Medicare Other | Admitting: Family Medicine

## 2020-12-18 VITALS — BP 152/70 | HR 85 | Temp 97.7°F | Ht 65.5 in | Wt 181.0 lb

## 2020-12-18 DIAGNOSIS — M545 Low back pain, unspecified: Secondary | ICD-10-CM | POA: Diagnosis not present

## 2020-12-18 NOTE — Progress Notes (Signed)
Patient ID: Patty Shelton, female    DOB: September 01, 1940, 80 y.o.   MRN: [id number]  This visit was conducted in person.  BP (!) 152/70   Pulse 85   Temp 97.7 F (36.5 C) (Temporal)   Ht 5' 5.5" (1.664 m)   Wt 181 lb (82.1 kg)   SpO2 94%   BMI 29.66 kg/m    CC:  Chief Complaint  Patient presents with   MIDDLE BACK PAIN     Subjective:   HPI: Patty Shelton is a 80 y.o. female presenting on 12/18/2020 for MIDDLE BACK PAIN    She reports new onset pain in mid  lower back... started after trip in June to [place] At end of ride and after lifting suitcases.Marland Kitchen started with mid back spasms. No fall  Pain lasted 1 week.. use tylenol an rest.  No numbness, no weakness.  No radiation of pain.  She is using mobic daily.     Relevant past medical, surgical, family and social history reviewed and updated as indicated. Interim medical history since our last visit reviewed. Allergies and medications reviewed and updated. Outpatient Medications Prior to Visit  Medication Sig Dispense Refill   acetaminophen (TYLENOL) 500 MG tablet Take 500 mg by mouth every 6 (six) hours as needed.     albuterol (PROVENTIL HFA;VENTOLIN HFA) 108 (90 Base) MCG/ACT inhaler Inhale 2 puffs into the lungs every 6 (six) hours as needed for wheezing or shortness of breath. 1 Inhaler 2   Biotin 10 MG TABS Take 1 tablet by mouth daily.     cetirizine (ZYRTEC) 10 MG tablet Take 10 mg by mouth daily.     Cholecalciferol (VITAMIN D3) 50 MCG (2000 UT) TABS Take 1 tablet by mouth daily.     CRANBERRY PO Take 1 capsule by mouth daily.     diclofenac sodium (VOLTAREN) 1 % GEL Apply 4 g topically 4 (four) times daily as needed.     famciclovir (FAMVIR) 250 MG tablet Take 1 tablet (250 mg total) by mouth daily. 90 tablet 3   fluticasone (FLONASE) 50 MCG/ACT nasal spray Place 2 sprays into both nostrils as needed.      fluticasone (FLOVENT HFA) 220 MCG/ACT inhaler Inhale 2 puffs into the lungs 2 (two) times daily. 3  each 1   Fluticasone Furoate (ARNUITY ELLIPTA) 100 MCG/ACT AEPB Inhale 1 puff into the lungs daily. 90 each 3   meloxicam (MOBIC) 15 MG tablet Take 15 mg by mouth daily.     Multiple Minerals-Vitamins (CAL-MAG-ZINC-D PO) Take 1 tablet by mouth daily.     mupirocin nasal ointment (BACTROBAN) 2 % Use small amount in each nostril twice daily for five (5) days. After application, press sides of nose together and gently massage. 10 g 0   Respiratory Therapy Supplies (FLUTTER) DEVI 1 Device by Does not apply route as directed. 1 each 0   rosuvastatin (CRESTOR) 10 MG tablet Take 1 tablet (10 mg total) by mouth daily. 90 tablet 3   valsartan (DIOVAN) 40 MG tablet Take 1 tablet (40 mg total) by mouth 2 (two) times daily. 30 tablet 11   vitamin B-12 (CYANOCOBALAMIN) 1000 MCG tablet Take 1,000 mcg by mouth daily.     vitamin E 180 MG (400 UNITS) capsule Take 400 Units by mouth daily.     pantoprazole (PROTONIX) 40 MG tablet Take 1 tablet (40 mg total) by mouth daily. (Patient not taking: No sig reported) 30 tablet 2   No facility-administered medications  prior to visit.     Per HPI unless specifically indicated in ROS section below Review of Systems  Constitutional:  Negative for fatigue and fever.  HENT:  Negative for congestion.   Eyes:  Negative for pain.  Respiratory:  Negative for cough and shortness of breath.   Cardiovascular:  Negative for chest pain, palpitations and leg swelling.  Gastrointestinal:  Negative for abdominal pain.  Genitourinary:  Negative for dysuria and vaginal bleeding.  Musculoskeletal:  Negative for back pain.  Neurological:  Negative for syncope, light-headedness and headaches.  Psychiatric/Behavioral:  Negative for dysphoric mood.   Objective:  BP (!) 152/70   Pulse 85   Temp 97.7 F (36.5 C) (Temporal)   Ht 5' 5.5" (1.664 m)   Wt 181 lb (82.1 kg)   SpO2 94%   BMI 29.66 kg/m   Wt Readings from Last 3 Encounters:  12/18/20 181 lb (82.1 kg)  10/08/20 179 lb  9.6 oz (81.5 kg)  09/01/20 181 lb 8 oz (82.3 kg)      Physical Exam Constitutional:      General: She is not in acute distress.    Appearance: Normal appearance. She is well-developed. She is not ill-appearing or toxic-appearing.  HENT:     Head: Normocephalic.     Right Ear: Hearing, tympanic membrane, ear canal and external ear normal. Tympanic membrane is not erythematous, retracted or bulging.     Left Ear: Hearing, tympanic membrane, ear canal and external ear normal. Tympanic membrane is not erythematous, retracted or bulging.     Nose: No mucosal edema or rhinorrhea.     Right Sinus: No maxillary sinus tenderness or frontal sinus tenderness.     Left Sinus: No maxillary sinus tenderness or frontal sinus tenderness.     Mouth/Throat:     Pharynx: Uvula midline.  Eyes:     General: Lids are normal. Lids are everted, no foreign bodies appreciated.     Conjunctiva/sclera: Conjunctivae normal.     Pupils: Pupils are equal, round, and reactive to light.  Neck:     Thyroid: No thyroid mass or thyromegaly.     Vascular: No carotid bruit.     Trachea: Trachea normal.  Cardiovascular:     Rate and Rhythm: Normal rate and regular rhythm.     Pulses: Normal pulses.     Heart sounds: Normal heart sounds, S1 normal and S2 normal. No murmur heard.   No friction rub. No gallop.  Pulmonary:     Effort: Pulmonary effort is normal. No tachypnea or respiratory distress.     Breath sounds: Normal breath sounds. No decreased breath sounds, wheezing, rhonchi or rales.  Abdominal:     General: Bowel sounds are normal.     Palpations: Abdomen is soft.     Tenderness: There is no abdominal tenderness.  Musculoskeletal:     Cervical back: Normal, normal range of motion and neck supple.     Thoracic back: Tenderness and bony tenderness present. Normal range of motion.     Lumbar back: Tenderness present. No bony tenderness. Normal range of motion. Negative right straight leg raise test and  negative left straight leg raise test.  Skin:    General: Skin is warm and dry.     Findings: No rash.  Neurological:     Mental Status: She is alert and oriented to person, place, and time.     GCS: GCS eye subscore is 4. GCS verbal subscore is 5. GCS motor subscore is 6.  Cranial Nerves: No cranial nerve deficit.     Sensory: No sensory deficit.     Motor: No abnormal muscle tone.     Coordination: Coordination normal.     Gait: Gait normal.     Deep Tendon Reflexes: Reflexes are normal and symmetric.     Comments: Nml cerebellar exam   No papilledema  Psychiatric:        Mood and Affect: Mood is not anxious or depressed.        Speech: Speech normal.        Behavior: Behavior normal. Behavior is cooperative.        Thought Content: Thought content normal.        Cognition and Memory: Memory is not impaired. She does not exhibit impaired recent memory or impaired remote memory.        Judgment: Judgment normal.      Results for orders placed or performed during the hospital encounter of 09/25/20  SARS CORONAVIRUS 2 (TAT 6-24 HRS) Nasopharyngeal Nasopharyngeal Swab   Specimen: Nasopharyngeal Swab  Result Value Ref Range   SARS Coronavirus 2 NEGATIVE NEGATIVE    This visit occurred during the SARS-CoV-2 public health emergency.  Safety protocols were in place, including screening questions prior to the visit, additional usage of staff PPE, and extensive cleaning of exam room while observing appropriate contact time as indicated for disinfecting solutions.   COVID 19 screen:  No recent travel or known exposure to COVID19 The patient denies respiratory symptoms of COVID 19 at this time. The importance of social distancing was discussed today.   Assessment and Plan    Problem List Items Addressed This Visit     Acute bilateral low back pain without sciatica - Primary     Minimal pain at this point.Marland Kitchen given back injury prevention information, given back exercises to start.   reviewed back care.  Continue meloxicam.   If pain continues to recur... consider X-ray given age and likely PT referral.         Eliezer Lofts, MD

## 2020-12-18 NOTE — Assessment & Plan Note (Signed)
Minimal pain at this point.Marland Kitchen given back injury prevention information, given back exercises to start.  reviewed back care.  Continue meloxicam.   If pain continues to recur... consider X-ray given age and likely PT referral.

## 2020-12-18 NOTE — Patient Instructions (Addendum)
Use lumbar support pillow in car.  Start low back exercises during the trip. Before leaving start with core strengthening. Continue Mobic 15 mg daily. Can use tylenol  as needed for flares  Back Injury Prevention Back injuries can be very painful. They can also be difficult to heal. After having one back injury, you are more likely to have another one again. It is important to learn how to avoid injuring or re-injuring your back. Thefollowing tips can help you to prevent a back injury. What actions can I take to prevent back injuries? Nutrition changes Talk with your health care provider about your overall diet, and especially about foods that strengthen your bones. Ask your health care provider how much calcium and vitamin D you need each day. These nutrients help to prevent weakening of the bones (osteoporosis). Osteoporosis can cause broken (fractured) bones, which lead to back pain. Eat foods that are good sources of calcium. These include dairy products, green leafy vegetables, and products that have had calcium added to them (fortified). Eat foods that are good sources of vitamin D. These include milk and foods that are fortified with vitamin D. If needed, take supplements and vitamins as directed by your health care provider. Physical fitness Physical fitness strengthens your bones and your muscles. It also increases your balance and strength. Exercise for 30 minutes per day on most days of the week, or as directed by your health care provider. Make sure to: Do aerobic exercises, such as walking, jogging, biking, or swimming. Do exercises that increase balance and strength, such as tai chi and yoga. These can decrease your risk of falling and injuring your back. Do stretching exercises to help with flexibility. Develop strong abdominal muscles. Your abdominal muscles provide a lot of the support that your back needs. Maintain a healthy weight. This helps to decrease your risk of a back  injury. Good posture        Prevent back injuries by developing and maintaining a good posture. To do this successfully: Sit up and stand up straight. Avoid leaning forward when you sit or hunching over when you stand. Choose chairs that have good low-back (lumbar) support. If you work at a desk, sit close to it so you do not need to lean over. Keep your chin tucked in. Keep your neck drawn back, and keep your elbows bent at a right angle. Sit high and close to the steering wheel when you drive. Add a lumbar support to your car seat, if needed. Avoid sitting or standing in one position for very long. Take breaks to get up, stretch, and walk around at least one time every hour. Take breaks every hour if you are driving for long periods of time. Sleep on your side with your knees slightly bent, or sleep on your back with a pillow under your knees.  Lifting, twisting, and reaching Back injuries are more likely to occur when carrying loads and twisting at the same time. When you bend and lift, or reach for items that are high up in shelves, use positions that put less stress on your back. Heavy lifting Avoid heavy lifting, especially the kind of heavy lifting that is repetitive. If you must do heavy lifting: Stretch before lifting. Work slowly. Rest between lifts. Use a tool such as a cart or a dolly to move objects. Make several small trips instead of carrying one heavy load. Ask for help when you need it, especially when moving big or heavy objects. Follow these  steps when lifting: Stand with your feet shoulder-width apart. Get as close to the object as you can. Do not try to pick up a heavy object that is far from your body. Use handles or lifting straps if they are available. Bend at your knees. Squat down, but keep your heels off the floor. Keep your shoulders pulled back, your chin tucked in, and your back straight. Lift the object slowly while you tighten the muscles in your legs,  abdomen, and buttocks. Keep the object as close to the center of your body as possible. Follow these steps when putting down a heavy load: Stand with your feet shoulder-width apart. Lower the object slowly while you tighten the muscles in your legs, abdomen, and buttocks. Keep the object as close to the center of your body as possible. Keep your shoulders pulled back, your chin tucked in, and your back straight. Bend at your knees. Squat down, but keep your heels off the floor. Use handles or lifting straps if they are available. Twisting and reaching Avoid lifting heavy objects above your waist. Do not twist at your waist while you are lifting or carrying a load. If you need to turn, move your feet. Do not bend over without bending at your knees. Avoid reaching over your head, across a table, or for an object on a high surface.  Other changes  Avoid wet floors and icy ground. Keep sidewalks clear of ice to prevent falls. Do not sleep on a mattress that is too soft or too hard. Put heavier objects on shelves at waist level, and put lighter objects on lower or higher shelves. Find ways to decrease your stress, such as by exercising, getting a massage, or practicing relaxation techniques. Stress can build up in your muscles. Tense muscles are more vulnerable to injury. Talk with your health care provider if you feel anxious or depressed. These conditions can make back pain worse. Wear flat heel shoes with cushioned soles. Use both shoulder straps when carrying a backpack. Do not use any products that contain nicotine or tobacco, such as cigarettes and e-cigarettes. If you need help quitting, ask your health care provider.  Summary Back injuries can be very painful and difficult to heal. You can prevent injuring or re-injuring your back by making nutrition changes, working on being physically fit, developing a good posture, and lifting heavy objects in a safe way. Making other changes can  also help to prevent back injuries. These include eating a healthy diet, exercising regularly and maintaining a healthy weight. This information is not intended to replace advice given to you by your health care provider. Make sure you discuss any questions you have with your healthcare provider. Document Revised: 02/20/2019 Document Reviewed: 07/15/2017 Elsevier Patient Education  2022 [employer].

## 2020-12-21 ENCOUNTER — Ambulatory Visit: Payer: TRICARE For Life (TFL) | Admitting: Dermatology

## 2021-01-01 DIAGNOSIS — H04123 Dry eye syndrome of bilateral lacrimal glands: Secondary | ICD-10-CM | POA: Diagnosis not present

## 2021-01-01 DIAGNOSIS — H524 Presbyopia: Secondary | ICD-10-CM | POA: Diagnosis not present

## 2021-01-01 DIAGNOSIS — H43393 Other vitreous opacities, bilateral: Secondary | ICD-10-CM | POA: Diagnosis not present

## 2021-01-01 DIAGNOSIS — H40013 Open angle with borderline findings, low risk, bilateral: Secondary | ICD-10-CM | POA: Diagnosis not present

## 2021-01-01 DIAGNOSIS — H35373 Puckering of macula, bilateral: Secondary | ICD-10-CM | POA: Diagnosis not present

## 2021-01-01 DIAGNOSIS — H52223 Regular astigmatism, bilateral: Secondary | ICD-10-CM | POA: Diagnosis not present

## 2021-02-17 DIAGNOSIS — I10 Essential (primary) hypertension: Secondary | ICD-10-CM | POA: Diagnosis not present

## 2021-02-25 ENCOUNTER — Telehealth: Payer: Self-pay

## 2021-02-25 NOTE — Telephone Encounter (Signed)
Noted and I appreciate you all handling her visit today.

## 2021-02-25 NOTE — Telephone Encounter (Signed)
Pt walked in [place]; I spoke with pt; 02/25/21 pts BP was 185/90s and reck 163/something; no pulse remembered by pt; pt took Clonidine 0.1 mg  at 8:30 AM this morning. Pt said she does not remember name of first BP med DR Diona Browner put pt on but pt said caused leg cramps and pt stopped med and Dr Diona Browner gave pt another BP med (pt does not remember name of med) per med list probably valsartan 40 mg taking 1 tab bid. Pt stopped the 2nd BP med about 2 years ago ( yes years are correct)due to swelling of ankles with rash. Pt said when stopped 2nd BP med swelling and rash of ankles went away. Pt did not tell DR Diona Browner that she stopped the 2nd BP med. When pt was seen by Dr Diona Browner 09/01/20 for BP FU pt did not tell Dr Diona Browner that she had not been taking the 2nd BP med. Pt said she did not routinely take her BP. Pt was in [facility] after death of family member earlier this month and pt said she "felt like BP was up" and went to UC in [facility]; pt does not remember the name of the UC in [facility] and said she was given no paperwork at [facility]. Pts BP was elevated and UC gave pt to take Lopressor 50 mg taking one daily and Clonidine 0.1 mg to take 1 pill bid prn for BP 160/90 or greater. Pt said since 02/17/21 pt has taken Lopressor 50 mg one daily around 9:30 in AM: (pt has not taken Lopressor so far today).pt said no problem since taking Lopressor. Today is the first time pt has needed to take the Clonidine 0.1 mg; pt took Clonidine 0.1 mg around 8:30 AM this morning. Pts husband drove her to [facility]. Pt said she has not had and does not have now any CP,SOB,H/A,dizziness, no swelling in extremities, or vision changes. Pt said "I feel fine". I took pts vitals; T 97; P 73; o2 level is 94% at room air and BP reg cuff sitting in lt arm 130/68. I spoke with Dr Einar Pheasant with above info and pt is to schedule appt with Dr Diona Browner, monitor BP and record daily; if symptoms or elevated BP take BP more than once a day. Dr Einar Pheasant said was OK  for pt to take her Losartan 50 mg today also.UC & ED precautions given and pt voiced understanding; pt is not going to over exert herself or get over heated and pt will drink plenty of water to stay hydrated. Pt scheduled in office appt with Dr Diona Browner at [facility] is aware to go to [facility] and address and phone # given to pt)on 02/26/21 at 9:20; pt to arrive 69' early to get checked in. No covid symptoms or exposure per pt. Sending note to Dr Einar Pheasant, Dr Diona Browner and Butch Penny CMA. Sending teams to [place] as well.

## 2021-02-25 NOTE — Telephone Encounter (Signed)
Glad pt was able to get close f/u with PCP

## 2021-02-26 ENCOUNTER — Ambulatory Visit (INDEPENDENT_AMBULATORY_CARE_PROVIDER_SITE_OTHER): Payer: Medicare Other | Admitting: Family Medicine

## 2021-02-26 ENCOUNTER — Encounter: Payer: Self-pay | Admitting: Family Medicine

## 2021-02-26 ENCOUNTER — Other Ambulatory Visit: Payer: Self-pay

## 2021-02-26 VITALS — BP 164/72 | HR 78 | Temp 97.4°F | Ht 65.5 in | Wt 179.0 lb

## 2021-02-26 DIAGNOSIS — I10 Essential (primary) hypertension: Secondary | ICD-10-CM | POA: Diagnosis not present

## 2021-02-26 NOTE — Assessment & Plan Note (Signed)
Poor control, chronic  Confusion about meds, was off for awhile no on duplicate meds given seen at Urgent care in  [facility]. Clarified regimen as below.  Throw away  Diovan( valsartan)  Put clonidine on shelf for emergencies  BP > 170/95.  Increase losartan 50 mg to 2 tabs daily for 100 mg daily.  Follow BP at home 1-2 times daily.Marland Kitchen goal < 150/90. Call early next week with measurements.Marland Kitchen around Tuesday.

## 2021-02-26 NOTE — Patient Instructions (Signed)
Throw away  Diovan( valsartan)  Put clonidine on shelf for emergencies  BP > 170/95.  Increase losartan 50 mg to 2 tabs daily for 100 mg daily.  Follow BP at home 1-2 times daily.Marland Kitchen goal < 150/90. Call early next week with measurements.Marland Kitchen around Tuesday.

## 2021-02-26 NOTE — Progress Notes (Signed)
Patient ID: Patty Shelton, female    DOB: 1941-02-20, 80 y.o.   MRN: [id number]  This visit was conducted in person.  BP (!) 164/72   Pulse 78   Temp (!) 97.4 F (36.3 C) (Temporal)   Ht 5' 5.5" (1.664 m)   Wt 179 lb (81.2 kg)   SpO2 95%   BMI 29.33 kg/m    CC: Chief Complaint  Patient presents with   Hypertension    Subjective:   HPI: Patty Shelton is a 80 y.o. female presenting on 02/26/2021 for Hypertension   Hypertension:    Previously well controlled on diovan 40 mg   once daily.  She has noted increase in stress with family issues, mother in law passed away.  IN last 2 weeks she was flushed and BP was elevated at  160-183/90.  Seen at Urgent Care in [facility].... she had not been taking the diovan.  BP was up.. she was prescribed losartan 50 mg daily No SE.   Clonidine 0.1 mg for BP > 160/90.   SE of swelling on amlodipine in past, cough with lisinopril in past  She thinks she is now taking losartan and back on diovan.  Using medication without problems or lightheadedness:  none Chest pain with exertion: none Edema:none Short of breath: none Average home BPs: Other issues:  She has not taken any meds this AM. BP Readings from Last 3 Encounters:  02/26/21 (!) 164/72  12/18/20 (!) 152/70  10/08/20 (!) 150/68        Relevant past medical, surgical, family and social history reviewed and updated as indicated. Interim medical history since our last visit reviewed. Allergies and medications reviewed and updated. Outpatient Medications Prior to Visit  Medication Sig Dispense Refill   acetaminophen (TYLENOL) 500 MG tablet Take 500 mg by mouth every 6 (six) hours as needed.     albuterol (PROVENTIL HFA;VENTOLIN HFA) 108 (90 Base) MCG/ACT inhaler Inhale 2 puffs into the lungs every 6 (six) hours as needed for wheezing or shortness of breath. 1 Inhaler 2   Biotin 10 MG TABS Take 1 tablet by mouth daily.     cetirizine (ZYRTEC) 10 MG tablet Take 10 mg by mouth  daily.     Cholecalciferol (VITAMIN D3) 50 MCG (2000 UT) TABS Take 1 tablet by mouth daily.     cloNIDine (CATAPRES) 0.1 MG tablet      CRANBERRY PO Take 1 capsule by mouth daily.     diclofenac sodium (VOLTAREN) 1 % GEL Apply 4 g topically 4 (four) times daily as needed.     famciclovir (FAMVIR) 250 MG tablet Take 1 tablet (250 mg total) by mouth daily. 90 tablet 3   fluticasone (FLONASE) 50 MCG/ACT nasal spray Place 2 sprays into both nostrils as needed.      fluticasone (FLOVENT HFA) 220 MCG/ACT inhaler Inhale 2 puffs into the lungs 2 (two) times daily. 3 each 1   Fluticasone Furoate (ARNUITY ELLIPTA) 100 MCG/ACT AEPB Inhale 1 puff into the lungs daily. 90 each 3   losartan (COZAAR) 50 MG tablet Take 50 mg by mouth daily.     meloxicam (MOBIC) 15 MG tablet Take 15 mg by mouth daily.     Multiple Minerals-Vitamins (CAL-MAG-ZINC-D PO) Take 1 tablet by mouth daily.     mupirocin nasal ointment (BACTROBAN) 2 % Use small amount in each nostril twice daily for five (5) days. After application, press sides of nose together and gently massage. 10 g 0  Respiratory Therapy Supplies (FLUTTER) DEVI 1 Device by Does not apply route as directed. 1 each 0   rosuvastatin (CRESTOR) 10 MG tablet Take 1 tablet (10 mg total) by mouth daily. 90 tablet 3   valsartan (DIOVAN) 40 MG tablet Take 1 tablet (40 mg total) by mouth 2 (two) times daily. 30 tablet 11   vitamin B-12 (CYANOCOBALAMIN) 1000 MCG tablet Take 1,000 mcg by mouth daily.     vitamin E 180 MG (400 UNITS) capsule Take 400 Units by mouth daily.     No facility-administered medications prior to visit.     Per HPI unless specifically indicated in ROS section below Review of Systems  Constitutional:  Negative for fatigue and fever.  HENT:  Negative for ear pain.   Eyes:  Negative for pain.  Respiratory:  Negative for chest tightness and shortness of breath.   Cardiovascular:  Negative for chest pain, palpitations and leg swelling.   Gastrointestinal:  Negative for abdominal pain.  Genitourinary:  Negative for dysuria.  Objective:  BP (!) 164/72   Pulse 78   Temp (!) 97.4 F (36.3 C) (Temporal)   Ht 5' 5.5" (1.664 m)   Wt 179 lb (81.2 kg)   SpO2 95%   BMI 29.33 kg/m   Wt Readings from Last 3 Encounters:  02/26/21 179 lb (81.2 kg)  12/18/20 181 lb (82.1 kg)  10/08/20 179 lb 9.6 oz (81.5 kg)      Physical Exam Constitutional:      General: She is not in acute distress.    Appearance: Normal appearance. She is well-developed. She is not ill-appearing or toxic-appearing.  HENT:     Head: Normocephalic.     Right Ear: Hearing, tympanic membrane, ear canal and external ear normal. Tympanic membrane is not erythematous, retracted or bulging.     Left Ear: Hearing, tympanic membrane, ear canal and external ear normal. Tympanic membrane is not erythematous, retracted or bulging.     Nose: No mucosal edema or rhinorrhea.     Right Sinus: No maxillary sinus tenderness or frontal sinus tenderness.     Left Sinus: No maxillary sinus tenderness or frontal sinus tenderness.     Mouth/Throat:     Pharynx: Uvula midline.  Eyes:     General: Lids are normal. Lids are everted, no foreign bodies appreciated.     Conjunctiva/sclera: Conjunctivae normal.     Pupils: Pupils are equal, round, and reactive to light.  Neck:     Thyroid: No thyroid mass or thyromegaly.     Vascular: No carotid bruit.     Trachea: Trachea normal.  Cardiovascular:     Rate and Rhythm: Normal rate and regular rhythm.     Pulses: Normal pulses.     Heart sounds: Normal heart sounds, S1 normal and S2 normal. No murmur heard.   No friction rub. No gallop.  Pulmonary:     Effort: Pulmonary effort is normal. No tachypnea or respiratory distress.     Breath sounds: Normal breath sounds. No decreased breath sounds, wheezing, rhonchi or rales.  Abdominal:     General: Bowel sounds are normal.     Palpations: Abdomen is soft.     Tenderness: There  is no abdominal tenderness.  Musculoskeletal:     Cervical back: Normal range of motion and neck supple.  Skin:    General: Skin is warm and dry.     Findings: No rash.  Neurological:     Mental Status: She is alert.  Psychiatric:        Mood and Affect: Mood is not anxious or depressed.        Speech: Speech normal.        Behavior: Behavior normal. Behavior is cooperative.        Thought Content: Thought content normal.        Judgment: Judgment normal.      Results for orders placed or performed during the hospital encounter of 09/25/20  SARS CORONAVIRUS 2 (TAT 6-24 HRS) Nasopharyngeal Nasopharyngeal Swab   Specimen: Nasopharyngeal Swab  Result Value Ref Range   SARS Coronavirus 2 NEGATIVE NEGATIVE    This visit occurred during the SARS-CoV-2 public health emergency.  Safety protocols were in place, including screening questions prior to the visit, additional usage of staff PPE, and extensive cleaning of exam room while observing appropriate contact time as indicated for disinfecting solutions.   COVID 19 screen:  No recent travel or known exposure to COVID19 The patient denies respiratory symptoms of COVID 19 at this time. The importance of social distancing was discussed today.   Assessment and Plan    Problem List Items Addressed This Visit     Primary hypertension - Primary    Poor control, chronic  Confusion about meds, was off for awhile no on duplicate meds given seen at Urgent care in  [facility]. Clarified regimen as below.  Throw away  Diovan( valsartan)  Put clonidine on shelf for emergencies  BP > 170/95.  Increase losartan 50 mg to 2 tabs daily for 100 mg daily.  Follow BP at home 1-2 times daily.Marland Kitchen goal < 150/90. Call early next week with measurements.Marland Kitchen around Tuesday.      Relevant Medications   cloNIDine (CATAPRES) 0.1 MG tablet   losartan (COZAAR) 50 MG tablet     Eliezer Lofts, MD

## 2021-03-03 ENCOUNTER — Telehealth: Payer: Self-pay | Admitting: Family Medicine

## 2021-03-03 NOTE — Telephone Encounter (Signed)
Pt called giving b/p numbers..9/16-148/63   9/17-159/66   9/18-148/66  9/19-158/94  9/20-166/75 evening 149/100 9/21-163/76

## 2021-03-03 NOTE — Telephone Encounter (Signed)
Spoke with Ms. Kalil.  She states she is taking 100 mg of Losartan daily.  She is agreeable to adding an additional BP medication.  [facility].

## 2021-03-04 ENCOUNTER — Other Ambulatory Visit: Payer: Self-pay | Admitting: Family Medicine

## 2021-03-04 MED ORDER — HYDROCHLOROTHIAZIDE 25 MG PO TABS: 25.0000 mg | ORAL_TABLET | Freq: Every day | ORAL | 11 refills | Status: DC

## 2021-03-04 NOTE — Telephone Encounter (Signed)
Pt called in regarding new BP medication wants to know if it's going to be called in . Would like a call back  #[phone]

## 2021-03-04 NOTE — Progress Notes (Signed)
[name] notified as instructed by telephone.  Patient states understanding.

## 2021-03-04 NOTE — Progress Notes (Signed)
I sent in  HCTZ 25 mg daily rx to local [facility].Marland Kitchen let pt know.  This is to be taken in addition to the losartan 100 mg daily.  Only use clonidine if above goal as previously discussed.

## 2021-03-04 NOTE — Telephone Encounter (Addendum)
Patient notified as instructed by telephone.  Patient states understanding.  Per Dr. Diona Browner in another encounter: I sent in  HCTZ 25 mg daily rx to local [facility].Marland Kitchen let pt know.  This is to be taken in addition to the losartan 100 mg daily.  Only use clonidine if above goal as previously discussed.

## 2021-03-16 ENCOUNTER — Encounter: Payer: Self-pay | Admitting: Family Medicine

## 2021-03-16 ENCOUNTER — Ambulatory Visit (INDEPENDENT_AMBULATORY_CARE_PROVIDER_SITE_OTHER): Payer: Medicare Other | Admitting: Family Medicine

## 2021-03-16 ENCOUNTER — Other Ambulatory Visit: Payer: Self-pay

## 2021-03-16 VITALS — BP 168/80 | HR 67 | Temp 97.3°F | Resp 16 | Ht 66.0 in | Wt 178.0 lb

## 2021-03-16 DIAGNOSIS — R0609 Other forms of dyspnea: Secondary | ICD-10-CM

## 2021-03-16 DIAGNOSIS — I10 Essential (primary) hypertension: Secondary | ICD-10-CM

## 2021-03-16 MED ORDER — LOSARTAN POTASSIUM 100 MG PO TABS: 100.0000 mg | ORAL_TABLET | Freq: Every day | ORAL | 3 refills | Status: DC

## 2021-03-16 NOTE — Patient Instructions (Addendum)
Continue losartan 100 mg daily.  Retry HCTZ at 1/2 tab daily ( 12.5 mg daily).. call if side effects or BP < 150/90.  We will start a referral to cardiology for further eval of shortness of breath on exertion.

## 2021-03-16 NOTE — Progress Notes (Signed)
Patient ID: Patty Shelton, female    DOB: June 24, 1940, 80 y.o.   MRN: [id number]  This visit was conducted in person.  BP (!) 178/98 (BP Location: Left Arm, Patient Position: Sitting, Cuff Size: Normal)   Pulse 75   Temp (!) 97.3 F (36.3 C) (Temporal)   Resp 16   Ht 5\' 6"  (1.676 m)   Wt 178 lb (80.7 kg)   SpO2 95%   BMI 28.73 kg/m    CC:  follow up HTN Subjective:   HPI: Patty Shelton is a 80 y.o. female presenting on 03/16/2021 for  Chief Complaint  Patient presents with   Follow-up    Blood Pressure    Hypertension:   On losartan 100 mg daily  Was not well controlled.. so HCTZ added.. she stopped as it made her feel weak.  Per her BP at home she reports  BP has been run BP Readings from Last 3 Encounters:  03/16/21 (!) 178/98  02/26/21 (!) 164/72  12/18/20 (!) 152/70  Using medication without problems or lightheadedness:  yes.. on HCTZ. 25 mg  BP was 97/59. 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  nonsmoker Average -home BPs: 134-151/60-72 Other issues:       Wt Readings from Last 3 Encounters:  03/16/21 178 lb (80.7 kg)  02/26/21 179 lb (81.2 kg)  12/18/20 181 lb (82.1 kg)     Relevant past medical, surgical, family and social history reviewed and updated as indicated. Interim medical history since our last visit reviewed. Allergies and medications reviewed and updated. Outpatient Medications Prior to Visit  Medication Sig Dispense Refill   acetaminophen (TYLENOL) 500 MG tablet Take 500 mg by mouth every 6 (six) hours as needed.     albuterol (PROVENTIL HFA;VENTOLIN HFA) 108 (90 Base) MCG/ACT inhaler Inhale 2 puffs into the lungs every 6 (six) hours as needed for wheezing or shortness of breath. 1 Inhaler 2   Biotin 10 MG TABS Take 1 tablet by mouth daily.     cetirizine (ZYRTEC) 10 MG tablet Take 10 mg by mouth daily.     Cholecalciferol (VITAMIN D3) 50 MCG (2000 UT) TABS  Take 1 tablet by mouth daily.     cloNIDine (CATAPRES) 0.1 MG tablet      CRANBERRY PO Take 1 capsule by mouth daily.     diclofenac sodium (VOLTAREN) 1 % GEL Apply 4 g topically 4 (four) times daily as needed.     famciclovir (FAMVIR) 250 MG tablet Take 1 tablet (250 mg total) by mouth daily. 90 tablet 3   fluticasone (FLONASE) 50 MCG/ACT nasal spray Place 2 sprays into both nostrils as needed.      Fluticasone Furoate (ARNUITY ELLIPTA) 100 MCG/ACT AEPB Inhale 1 puff into the lungs daily. 90 each 3   losartan (COZAAR) 50 MG tablet Take 100 mg by mouth daily.     meloxicam (MOBIC) 15 MG tablet Take 15 mg by mouth daily.     Multiple Minerals-Vitamins (CAL-MAG-ZINC-D PO) Take 1 tablet by mouth daily.     mupirocin nasal ointment (BACTROBAN) 2 % Use small amount in each nostril twice daily for five (5) days. After application, press sides of nose together and gently massage. 10 g 0   Respiratory Therapy Supplies (FLUTTER) DEVI 1 Device by Does not apply route as directed. 1 each 0   rosuvastatin (CRESTOR) 10 MG tablet Take 1 tablet (10 mg  total) by mouth daily. 90 tablet 3   vitamin B-12 (CYANOCOBALAMIN) 1000 MCG tablet Take 1,000 mcg by mouth daily.     vitamin E 180 MG (400 UNITS) capsule Take 400 Units by mouth daily.     fluticasone (FLOVENT HFA) 220 MCG/ACT inhaler Inhale 2 puffs into the lungs 2 (two) times daily. 3 each 1   hydrochlorothiazide (HYDRODIURIL) 25 MG tablet Take 1 tablet (25 mg total) by mouth daily. (Patient not taking: Reported on 03/16/2021) 30 tablet 11   No facility-administered medications prior to visit.     Per HPI unless specifically indicated in ROS section below Review of Systems  Constitutional:  Negative for fatigue and fever.  HENT:  Negative for congestion.   Eyes:  Negative for pain.  Respiratory:  Positive for shortness of breath. Negative for cough.   Cardiovascular:  Negative for chest pain, palpitations and leg swelling.  Gastrointestinal:  Negative  for abdominal pain.  Genitourinary:  Negative for dysuria and vaginal bleeding.  Musculoskeletal:  Negative for back pain.  Neurological:  Negative for syncope, light-headedness and headaches.  Psychiatric/Behavioral:  Negative for dysphoric mood.   Objective:  BP (!) 178/98 (BP Location: Left Arm, Patient Position: Sitting, Cuff Size: Normal)   Pulse 75   Temp (!) 97.3 F (36.3 C) (Temporal)   Resp 16   Ht 5\' 6"  (1.676 m)   Wt 178 lb (80.7 kg)   SpO2 95%   BMI 28.73 kg/m   Wt Readings from Last 3 Encounters:  03/16/21 178 lb (80.7 kg)  02/26/21 179 lb (81.2 kg)  12/18/20 181 lb (82.1 kg)      Physical Exam Constitutional:      General: She is not in acute distress.    Appearance: Normal appearance. She is well-developed. She is not ill-appearing or toxic-appearing.  HENT:     Head: Normocephalic.     Right Ear: Hearing, tympanic membrane, ear canal and external ear normal. Tympanic membrane is not erythematous, retracted or bulging.     Left Ear: Hearing, tympanic membrane, ear canal and external ear normal. Tympanic membrane is not erythematous, retracted or bulging.     Nose: No mucosal edema or rhinorrhea.     Right Sinus: No maxillary sinus tenderness or frontal sinus tenderness.     Left Sinus: No maxillary sinus tenderness or frontal sinus tenderness.     Mouth/Throat:     Pharynx: Uvula midline.  Eyes:     General: Lids are normal. Lids are everted, no foreign bodies appreciated.     Conjunctiva/sclera: Conjunctivae normal.     Pupils: Pupils are equal, round, and reactive to light.  Neck:     Thyroid: No thyroid mass or thyromegaly.     Vascular: No carotid bruit.     Trachea: Trachea normal.  Cardiovascular:     Rate and Rhythm: Normal rate and regular rhythm.     Pulses: Normal pulses.     Heart sounds: Normal heart sounds, S1 normal and S2 normal. No murmur heard.   No friction rub. No gallop.  Pulmonary:     Effort: Pulmonary effort is normal. No  tachypnea or respiratory distress.     Breath sounds: Normal breath sounds. No decreased breath sounds, wheezing, rhonchi or rales.  Abdominal:     General: Bowel sounds are normal.     Palpations: Abdomen is soft.     Tenderness: There is no abdominal tenderness.  Musculoskeletal:     Cervical back: Normal range of motion  and neck supple.  Skin:    General: Skin is warm and dry.     Findings: No rash.  Neurological:     Mental Status: She is alert.  Psychiatric:        Mood and Affect: Mood is not anxious or depressed.        Speech: Speech normal.        Behavior: Behavior normal. Behavior is cooperative.        Thought Content: Thought content normal.        Judgment: Judgment normal.      Results for orders placed or performed during the hospital encounter of 09/25/20  SARS CORONAVIRUS 2 (TAT 6-24 HRS) Nasopharyngeal Nasopharyngeal Swab   Specimen: Nasopharyngeal Swab  Result Value Ref Range   SARS Coronavirus 2 NEGATIVE NEGATIVE    This visit occurred during the SARS-CoV-2 public health emergency.  Safety protocols were in place, including screening questions prior to the visit, additional usage of staff PPE, and extensive cleaning of exam room while observing appropriate contact time as indicated for disinfecting solutions.   COVID 19 screen:  No recent travel or known exposure to COVID19 The patient denies respiratory symptoms of COVID 19 at this time. The importance of social distancing was discussed today.   Assessment and Plan Problem List Items Addressed This Visit     DOE (dyspnea on exertion)    Likely multifactorial due to deconditioning, ILD/ pulmonary disease. 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   Will refer to cardiology for further evaluation per pt request.  Consider to determine if CVD contribution to [place].  On Crestor for cholesterol CVD prevention      Relevant Orders   EKG 12-Lead  (Completed)   Ambulatory referral to Cardiology   Primary hypertension - Primary    Chronic, inadequate control.  improving control on home measurments with losartan 100 mg daily.  BP dropped low and SE to HCTZ 25 mg. Will try 1/2 tab HCTZ daily and pt will follow BP at home and update me with BPs.  Goal < 150/90      Relevant Medications   losartan (COZAAR) 100 MG tablet   Other Relevant Orders   EKG 12-Lead (Completed)   Ambulatory referral to Cardiology   Meds ordered this encounter  Medications   losartan (COZAAR) 100 MG tablet    Sig: Take 1 tablet (100 mg total) by mouth daily.    Dispense:  90 tablet    Refill:  3        Eliezer Lofts, MD

## 2021-03-16 NOTE — Assessment & Plan Note (Signed)
Likely multifactorial due to deconditioning, ILD/ pulmonary disease. 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   Will refer to cardiology for further evaluation per pt request.  Consider to determine if CVD contribution to [place].  On Crestor for cholesterol CVD prevention

## 2021-03-16 NOTE — Assessment & Plan Note (Signed)
Chronic, inadequate control.  improving control on home measurments with losartan 100 mg daily.  BP dropped low and SE to HCTZ 25 mg. Will try 1/2 tab HCTZ daily and pt will follow BP at home and update me with BPs.  Goal < 150/90

## 2021-04-03 ENCOUNTER — Encounter: Payer: Self-pay | Admitting: Emergency Medicine

## 2021-04-03 ENCOUNTER — Other Ambulatory Visit: Payer: Self-pay

## 2021-04-03 ENCOUNTER — Ambulatory Visit
Admission: EM | Admit: 2021-04-03 | Discharge: 2021-04-03 | Disposition: A | Discharge status: Home / Self Care | Payer: Medicare Other | Attending: Emergency Medicine | Admitting: Emergency Medicine

## 2021-04-03 DIAGNOSIS — M79674 Pain in right toe(s): Secondary | ICD-10-CM

## 2021-04-03 DIAGNOSIS — M109 Gout, unspecified: Secondary | ICD-10-CM | POA: Diagnosis not present

## 2021-04-03 DIAGNOSIS — I10 Essential (primary) hypertension: Secondary | ICD-10-CM

## 2021-04-03 MED ORDER — PREDNISONE 10 MG (21) PO TBPK: ORAL_TABLET | Freq: Every day | ORAL | 0 refills | Status: DC

## 2021-04-03 NOTE — ED Triage Notes (Signed)
Pt here with right great toe pain x 3 days. Toe is red and swollen and pain starts at toenail and radiates up the foot. States she feel like something is stuck in the bottom of the foot as well.

## 2021-04-03 NOTE — Discharge Instructions (Addendum)
Take the prednisone as directed.  Follow-up with your primary care provider on Monday.    Your blood pressure is elevated today at 167/73.  Please have this rechecked by your primary care provider in 2-4 weeks.

## 2021-04-03 NOTE — ED Provider Notes (Signed)
Roderic Palau    CSN: [id number] Arrival date & time: 04/03/21  0813      History   Chief Complaint Chief Complaint  Patient presents with   Toe Pain    HPI Patty Shelton is a 80 y.o. female.  Patient presents with pain in her right great toe x3 days; worse since last night.  Her toe is red and swollen.  She has calluses on the bottom of her foot that are also painful.  No falls or injury.  No open wounds or drainage.  No fever, chills, numbness, weakness, or other symptoms.  No treatments at home.  She denies history of gout.  Her medical history includes hypertension.  The history is provided by the patient and medical records.   Past Medical History:  Diagnosis Date   Hyperlipemia    Hypertension     Patient Active Problem List   Diagnosis Date Noted   Acute bilateral low back pain without sciatica 12/18/2020   Bronchiectasis without complication ([place]) 61/95/0932   ILD (interstitial lung disease) ([place]) 04/02/2020   Nail, injury by 02/21/2019   Capsulitis of metatarsophalangeal (MTP) joint of left foot 02/21/2019   Hav (hallux abducto valgus), left 02/21/2019   Coronary atherosclerosis 10/11/2018   Dysphagia 03/28/2017   GERD (gastroesophageal reflux disease) 03/28/2017   MRSA (methicillin resistant staph aureus) culture positive 03/28/2017   Mixed incontinence 06/24/2016   CKD (chronic kidney disease) stage 3, GFR 30-59 ml/min ([place]) 05/22/2015   Advance directive discussed with patient 05/22/2015   DOE (dyspnea on exertion) 04/03/2014   Hearing loss 03/01/2013   Herpes genitalia 04/28/2011   Osteopenia 03/22/2011   Primary hypertension 02/11/2011   HYPERCHOLESTEROLEMIA 03/27/2009   Allergic rhinitis 03/27/2009    Past Surgical History:  Procedure Laterality Date   CATARACT EXTRACTION     RETINAL DETACHMENT SURGERY      OB History   No obstetric history on file.      Home Medications    Prior to Admission medications   Medication Sig  Start Date End Date Taking? Authorizing Provider  predniSONE (STERAPRED UNI-PAK 21 TAB) 10 MG (21) TBPK tablet Take by mouth daily. As directed 04/03/21  Yes Sharion Balloon, NP  acetaminophen (TYLENOL) 500 MG tablet Take 500 mg by mouth every 6 (six) hours as needed.    [provider]  albuterol (PROVENTIL HFA;VENTOLIN HFA) 108 (90 Base) MCG/ACT inhaler Inhale 2 puffs into the lungs every 6 (six) hours as needed for wheezing or shortness of breath. 11/27/16   Delman Kitten, MD  Biotin 10 MG TABS Take 1 tablet by mouth daily.    [provider]  cetirizine (ZYRTEC) 10 MG tablet Take 10 mg by mouth daily.    [provider]  Cholecalciferol (VITAMIN D3) 50 MCG (2000 UT) TABS Take 1 tablet by mouth daily.    [provider]  CRANBERRY PO Take 1 capsule by mouth daily.    [provider]  diclofenac sodium (VOLTAREN) 1 % GEL Apply 4 g topically 4 (four) times daily as needed.    [provider]  famciclovir (FAMVIR) 250 MG tablet Take 1 tablet (250 mg total) by mouth daily. 08/18/20   Bedsole, Amy E, MD  fluticasone (FLONASE) 50 MCG/ACT nasal spray Place 2 sprays into both nostrils as needed.     [provider]  fluticasone (FLOVENT HFA) 220 MCG/ACT inhaler Inhale 2 puffs into the lungs 2 (two) times daily. 03/04/20 03/04/21  Flora Lipps, MD  Fluticasone Furoate (ARNUITY ELLIPTA) 100 MCG/ACT AEPB Inhale 1 puff into the lungs daily. 10/14/20   Flora Lipps, MD  losartan (COZAAR) 100 MG tablet Take 1 tablet (100 mg total) by mouth daily. 03/16/21   Bedsole, Amy E, MD  meloxicam (MOBIC) 15 MG tablet Take 15 mg by mouth daily.    [provider]  Multiple Minerals-Vitamins (CAL-MAG-ZINC-D PO) Take 1 tablet by mouth daily.    [provider]  mupirocin nasal ointment (BACTROBAN) 2 % Use small amount in each nostril twice daily for five (5) days. After application, press sides of nose together and gently massage. 10/23/18   Jinny Sanders,  MD  Respiratory Therapy Supplies (FLUTTER) DEVI 1 Device by Does not apply route as directed. 10/15/18   Flora Lipps, MD  rosuvastatin (CRESTOR) 10 MG tablet Take 1 tablet (10 mg total) by mouth daily. 08/18/20   Bedsole, Amy E, MD  vitamin B-12 (CYANOCOBALAMIN) 1000 MCG tablet Take 1,000 mcg by mouth daily.    [provider]  vitamin E 180 MG (400 UNITS) capsule Take 400 Units by mouth daily.    [provider]    Family History Family History  Problem Relation Age of Onset   Heart attack Mother    Stroke Father    Hyperlipidemia Father    Dementia Father     Social History Social History   Tobacco Use   Smoking status: Never   Smokeless tobacco: Never  Vaping Use   Vaping Use: Never used  Substance Use Topics   Alcohol use: No    Alcohol/week: 0.0 standard drinks   Drug use: No     Allergies   Lipitor [atorvastatin] and Amlodipine besylate   Review of Systems Review of Systems  Constitutional:  Negative for chills and fever.  Respiratory:  Negative for cough and shortness of breath.   Cardiovascular:  Negative for chest pain and palpitations.  Musculoskeletal:  Positive for arthralgias and joint swelling. Negative for gait problem.  Skin:  Negative for color change, rash and wound.  Neurological:  Negative for weakness and numbness.  All other systems reviewed and are negative.   Physical Exam Triage Vital Signs ED Triage Vitals  Enc Vitals Group     BP 04/03/21 0829 (!) 167/73     Pulse Rate 04/03/21 0829 70     Resp 04/03/21 0829 18     Temp 04/03/21 0829 97.9 F (36.6 C)     Temp Source 04/03/21 0829 Oral     SpO2 04/03/21 0829 94 %     Weight --      Height --      Head Circumference --      Peak Flow --      Pain Score 04/03/21 0843 5     Pain Loc --      Pain Edu? --      Excl. in [place]? --    No data found.  Updated Vital Signs BP (!) 167/73 (BP Location: Left Arm)   Pulse 70   Temp 97.9 F (36.6 C) (Oral)   Resp 18    SpO2 94%   Visual Acuity Right Eye Distance:   Left Eye Distance:   Bilateral Distance:    Right Eye Near:   Left Eye Near:    Bilateral Near:     Physical Exam Vitals and nursing note reviewed.  Constitutional:      General: She is not in acute distress.    Appearance: She  is well-developed. She is not ill-appearing.  HENT:     Head: Normocephalic and atraumatic.     Mouth/Throat:     Mouth: Mucous membranes are moist.  Eyes:     Conjunctiva/sclera: Conjunctivae normal.  Cardiovascular:     Rate and Rhythm: Normal rate and regular rhythm.     Heart sounds: Normal heart sounds.  Pulmonary:     Effort: Pulmonary effort is normal. No respiratory distress.     Breath sounds: Normal breath sounds.  Abdominal:     Palpations: Abdomen is soft.     Tenderness: There is no abdominal tenderness.  Musculoskeletal:        General: Swelling and tenderness present. No deformity. Normal range of motion.     Cervical back: Neck supple.       Feet:     Comments: Very mild erythema and edema of right great toe.  Mildly tender to palpation.  Full ROM, sensation intact, 2+ pulses, strength 5/5.  No open wounds.  Several calluses on the bottom of her feet.  Skin:    General: Skin is warm and dry.     Capillary Refill: Capillary refill takes less than 2 seconds.     Findings: Erythema present. No bruising, lesion or rash.  Neurological:     General: No focal deficit present.     Mental Status: She is alert and oriented to person, place, and time.     Sensory: No sensory deficit.     Motor: No weakness.     Gait: Gait normal.  Psychiatric:        Mood and Affect: Mood normal.        Behavior: Behavior normal.     UC Treatments / Results  Labs (all labs ordered are listed, but only abnormal results are displayed) Labs Reviewed - No data to display  EKG   Radiology No results found.  Procedures Procedures (including critical care time)  Medications Ordered in  UC Medications - No data to display  Initial Impression / Assessment and Plan / UC Course  I have reviewed the triage vital signs and the nursing notes.  Pertinent labs & imaging results that were available during my care of the patient were reviewed by me and considered in my medical decision making (see chart for details).  Right great toe pain, gout.  Elevated blood pressure reading with hypertension.  Treating with prednisone taper.  Education provided on gout and foot pain.  Instructed patient to follow-up with her PCP on Monday.  Strict return or go to ED precautions discussed with patient.  Also discussed that her blood pressure is elevated today and needs to be rechecked by her PCP in 2 to 4 weeks.  She agrees to plan of care.   Final Clinical Impressions(s) / UC Diagnoses   Final diagnoses:  Great toe pain, right  Acute gout involving toe of right foot, unspecified cause  Elevated blood pressure reading in office with diagnosis of hypertension     Discharge Instructions      Take the prednisone as directed.  Follow-up with your primary care provider on Monday.    Your blood pressure is elevated today at 167/73.  Please have this rechecked by your primary care provider in 2-4 weeks.          ED Prescriptions     Medication Sig Dispense Auth. Provider   predniSONE (STERAPRED UNI-PAK 21 TAB) 10 MG (21) TBPK tablet Take by mouth daily. As directed 21  tablet Sharion Balloon, NP      PDMP not reviewed this encounter.   Sharion Balloon, NP 04/03/21 [phone]

## 2021-04-15 ENCOUNTER — Other Ambulatory Visit: Payer: Self-pay

## 2021-04-15 ENCOUNTER — Ambulatory Visit (INDEPENDENT_AMBULATORY_CARE_PROVIDER_SITE_OTHER): Payer: Medicare Other | Admitting: Cardiology

## 2021-04-15 ENCOUNTER — Encounter: Payer: Self-pay | Admitting: Cardiology

## 2021-04-15 VITALS — BP 160/72 | HR 92 | Ht 66.0 in | Wt 177.1 lb

## 2021-04-15 DIAGNOSIS — E7800 Pure hypercholesterolemia, unspecified: Secondary | ICD-10-CM | POA: Diagnosis not present

## 2021-04-15 DIAGNOSIS — R072 Precordial pain: Secondary | ICD-10-CM

## 2021-04-15 DIAGNOSIS — R0609 Other forms of dyspnea: Secondary | ICD-10-CM

## 2021-04-15 DIAGNOSIS — I25118 Atherosclerotic heart disease of native coronary artery with other forms of angina pectoris: Secondary | ICD-10-CM

## 2021-04-15 DIAGNOSIS — I10 Essential (primary) hypertension: Secondary | ICD-10-CM

## 2021-04-15 MED ORDER — METOPROLOL TARTRATE 100 MG PO TABS: 100.0000 mg | ORAL_TABLET | Freq: Once | ORAL | 0 refills | Status: DC

## 2021-04-15 NOTE — Assessment & Plan Note (Signed)
I do think it is multifactorial and agree with Dr. Diona Browner.  She does have deconditioning, advanced age as well as her pulmonary disease as potential etiologies.  However she also has poorly controlled hypertension and coronary atherosclerosis noted on CT scan. ->  Need to exclude cardiac dysfunction (either diastolic or combined systolic and diastolic simply), also need to exclude occlusive CAD.  Plan: 2D Echocardiogram, Coronary CTA

## 2021-04-15 NOTE — Assessment & Plan Note (Signed)
Most recent of the panel showed that the LDL went up dramatically from March.  I suspected working to be concerned about potential CAD that we had to go up higher on the dose of of statin.  As Part of Her Exertional Dyspnea work-up, we are checking for Coronary CT Angiogram which will also give [place] information about coronary calcium score and extent of coronary disease.  Based on the results, I suspect that we may be titrating her medications.

## 2021-04-15 NOTE — Assessment & Plan Note (Signed)
Coronary atherosclerosis noted on CT scan is only suggestive of atherosclerotic disease. However, since she is having exertional dyspnea and is worried about this, with her risk factors and hypertension and hyperlipidemia I advanced age, not unreasonable to evaluate existence of CAD.  For both quantitative and qualitative data on presence of coronary artery disease, will order Coronary CT Angiogram -> this provides both anatomic data but also physiologic data from CT FFR if indicated.

## 2021-04-15 NOTE — Patient Instructions (Addendum)
Medication Instructions:  No changes at this time.  *If you need a refill on your cardiac medications before your next appointment, please call your pharmacy*   Lab Work: CBC & BMP today  If you have labs (blood work) drawn today and your tests are completely normal, you will receive your results only by: Cook (if you have MyChart) OR A paper copy in the mail If you have any lab test that is abnormal or we need to change your treatment, we will call you to review the results.   Testing/Procedures: Your physician has requested that you have an echocardiogram. Echocardiography is a painless test that uses sound waves to create images of your heart. It provides your doctor with information about the size and shape of your heart and how well your heart's chambers and valves are working. This procedure takes approximately one hour. There are no restrictions for this procedure.    Your cardiac CT is scheduled for Monday May 03, 2021 at 09:00 am at:   [facility] [place], [place] [address] [phone]  Please arrive at 08:45 am (15 mins early) for check-in and test prep.   On the Night Before the Test: Be sure to Drink plenty of water. Do not consume any caffeinated/decaffeinated beverages or chocolate 12 hours prior to your test. Do not take any antihistamines 12 hours prior to your test.  On the Day of the Test: Drink plenty of water until 1 hour prior to the test. Do not eat any food 4 hours prior to the test. You may take your regular medications prior to the test.  Take metoprolol (Lopressor) 100 mg two hours prior to test. FEMALES- please wear underwire-free bra if available, avoid dresses & tight clothing       After the Test: Drink plenty of water. After receiving IV contrast, you may experience a mild flushed feeling. This is normal. On occasion, you may experience a mild rash up to 24 hours  after the test. This is not dangerous. If this occurs, you can take Benadryl 25 mg and increase your fluid intake. If you experience trouble breathing, this can be serious. If it is severe call 911 IMMEDIATELY. If it is mild, please call our office. If you take any of these medications: Glipizide/Metformin, Avandament, Glucavance, please do not take 48 hours after completing test unless otherwise instructed.  Please allow 2-4 weeks for scheduling of routine cardiac CTs. Some insurance companies require a pre-authorization which may delay scheduling of this test.   For non-scheduling related questions, please contact the cardiac imaging nurse navigator should you have any questions/concerns: Marchia Bond, Cardiac Imaging Nurse Navigator Gordy Clement, Cardiac Imaging Nurse Navigator [place] [facility] Direct Office Dial: [phone]   For scheduling needs, including cancellations and rescheduling, please call [phone]    Follow-Up: At [facility], you and your health needs are our priority.  As part of our continuing mission to provide you with exceptional heart care, we have created designated Provider Care Teams.  These Care Teams include your primary Cardiologist (physician) and Advanced Practice Providers (APPs -  Physician Assistants and Nurse Practitioners) who all work together to provide you with the care you need, when you need it.  We recommend signing up for the patient portal called "MyChart".  Sign up information is provided on this After Visit Summary.  MyChart is used to connect with patients for Virtual Visits (Telemedicine).  Patients are  able to view lab/test results, encounter notes, upcoming appointments, etc.  Non-urgent messages can be sent to your provider as well.   To learn more about what you can do with MyChart, go to [email or web address].    Your next appointment:   Follow up after testing has been done.   The format for your next  appointment:   In Person  Provider:   You may see Dr. Glenetta Hew or one of the following Advanced Practice Providers on your designated Care Team:   Murray Hodgkins, NP Christell Faith, PA-C Marrianne Mood, PA-C Cadence Teller, [place]

## 2021-04-15 NOTE — Assessment & Plan Note (Addendum)
Blood pressures not adequately controlled on 100 mg losartan and HCTZ 12.5 mg daily. Unfortunately, she did have swelling with amlodipine I wonder though if she goes to 25 mg of HCTZ, perhaps the edema will be less notable.  We could convert losartan to a more potent ARB. She does initially have any wheezing so we could potentially start her on carvedilol, and we could  Otherwise add new medication probably would try spironolactone first.  Would like to see the results of her echocardiogram to determine how aggressively to be.

## 2021-04-15 NOTE — Progress Notes (Signed)
Primary Care Provider: Jinny Sanders, MD Cardiologist: None Electrophysiologist: None  Clinic Note: Chief Complaint  Patient presents with   New Patient (Initial Visit)    Ref by Dr. Diona Shelton for shortness of breath & uncontrolled HTN. Medications reviewed by the patient verbally.    Shortness of Breath    With exertion   Hypertension    Has tried multiple different medications.    ===================================  ASSESSMENT/PLAN   Problem List Items Addressed This Visit       Cardiology Problems   HYPERCHOLESTEROLEMIA (Chronic)    Most recent of the panel showed that the LDL went up dramatically from March.  I suspected working to be concerned about potential CAD that we had to go up higher on the dose of of statin.  As Part of Her Exertional Dyspnea work-up, we are checking for Coronary CT Angiogram which will also give [place] information about coronary calcium score and extent of coronary disease.  Based on the results, I suspect that we may be titrating her medications.      Relevant Medications   metoprolol tartrate (LOPRESSOR) 100 MG tablet   Other Relevant Orders   EKG 12-Lead   CBC   Basic metabolic panel   Primary hypertension - Primary (Chronic)    Blood pressures not adequately controlled on 100 mg losartan and HCTZ 12.5 mg daily. Unfortunately, she did have swelling with amlodipine I wonder though if she goes to 25 mg of HCTZ, perhaps the edema will be less notable.  We could convert losartan to a more potent ARB. She does initially have any wheezing so we could potentially start her on carvedilol, and we could  Otherwise add new medication probably would try spironolactone first.  Would like to see the results of her echocardiogram to determine how aggressively to be.      Relevant Medications   metoprolol tartrate (LOPRESSOR) 100 MG tablet   Other Relevant Orders   EKG 12-Lead   CBC   Basic metabolic panel   Coronary atherosclerosis (Chronic)     Coronary atherosclerosis noted on CT scan is only suggestive of atherosclerotic disease. However, since she is having exertional dyspnea and is worried about this, with her risk factors and hypertension and hyperlipidemia I advanced age, not unreasonable to evaluate existence of CAD.  For both quantitative and qualitative data on presence of coronary artery disease, will order Coronary CT Angiogram -> this provides both anatomic data but also physiologic data from CT FFR if indicated.       Relevant Medications   metoprolol tartrate (LOPRESSOR) 100 MG tablet   Other Relevant Orders   EKG 12-Lead   CBC   Basic metabolic panel     Other   DOE (dyspnea on exertion)    I do think it is multifactorial and agree with Dr. Diona Shelton.  She does have deconditioning, advanced age as well as her pulmonary disease as potential etiologies.  However she also has poorly controlled hypertension and coronary atherosclerosis noted on CT scan. ->  Need to exclude cardiac dysfunction (either diastolic or combined systolic and diastolic simply), also need to exclude occlusive CAD.  Plan: 2D Echocardiogram, Coronary CTA      Relevant Orders   EKG 12-Lead   ECHOCARDIOGRAM COMPLETE   CBC   Basic metabolic panel   Other Visit Diagnoses     Precordial pain       Relevant Orders   EKG 12-Lead   ECHOCARDIOGRAM COMPLETE   CT CORONARY MORPH  W/CTA COR W/SCORE W/CA W/CM &/OR WO/CM   CBC   Basic metabolic panel      ===================================  HPI:    Patty Shelton is a 80 y.o. female With past Medical History Notable for Hypertension and Hyperlipidemia & longstanding ILD / Bronchioloectasis who is being seen today for the evaluation of DYSPNEA ON EXERTION at the request of Patty Sanders, MD.  Patty Shelton was seen by Dr. Diona Shelton on March 16, 2021 for follow-up evaluation of shortness of breath.  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  Per patient request, refer for cardiology evaluation.  Recent Hospitalizations: None  Reviewed  CV studies:    The following studies were reviewed today: (if available, images/films reviewed: From Epic Chart or Care Everywhere) PFTs 09/2020: Findings suggest small obstructive airways disease with +BD response Chest CT (high resolution) 10/10/2018: Atherosclerotic nonaneurysmal thoracic aorta.  LAD and RCA atherosclerosis.  Mild basilar predominant interstitial lung disease with mild traction bronchiolectasis and groundglass predominance.  No frank honeycombing.  Notable progressions from June 2018.  DDx = NSIP or UIP.   Interval History:   Patty Shelton is a very pleasant 80 year old woman who is concerned because of her high blood pressure being poorly controlled and now having continued exertional dyspnea.  She is worried about having another etiology to it.  She states that she has to stop at least 1 time walking up to the mailbox and back which is couple 100 feet.  She denies any chest pain or pressure just has dyspnea that makes her stop.  She also notes that doing chores around the house including gardening etc. if she does more than usual will also make her short of breath.  No resting dyspnea.  No PND, orthopnea or edema.  Otherwise essentially no cardiac symptoms of arrhythmias besides some rare palpitations.   CV Review of Symptoms (Summary) Cardiovascular ROS: positive for - dyspnea on exertion, palpitations, and she did have leg swelling with amlodipine negative for - chest pain, edema, orthopnea, paroxysmal nocturnal dyspnea, rapid heart rate, or lightheadedness, dizziness or wooziness.  Syncope/near syncope or TIA/amaurosis fugax, claudication  REVIEWED OF SYSTEMS   Review of Systems  Constitutional:  Negative for malaise/fatigue (Somewhat deconditioned.  Not much energy level, but not fatigued) and weight loss.  HENT:  Negative for  congestion and nosebleeds.   Eyes:  Negative for blurred vision.  Respiratory:  Positive for cough (Rare) and shortness of breath. Negative for wheezing.   Cardiovascular:  Negative for leg swelling.  Gastrointestinal:  Negative for blood in stool and melena.  Genitourinary:  Negative for hematuria.  Musculoskeletal:  Positive for joint pain. Negative for back pain and falls.  Neurological:  Positive for dizziness (Rarely when standing up too quickly, maybe loses her balance occasionally.). Negative for focal weakness and weakness.  Psychiatric/Behavioral:  Negative for depression and memory loss. The patient is not nervous/anxious and does not have insomnia.    I have reviewed and (if needed) personally updated the patient's problem list, medications, allergies, past medical and surgical history, social and family history.   PAST MEDICAL HISTORY   Past Medical History:  Diagnosis Date   Hyperlipemia    Hypertension     PAST SURGICAL HISTORY   Past Surgical History:  Procedure Laterality Date   CATARACT EXTRACTION     RETINAL DETACHMENT SURGERY      Immunization History  Administered Date(s) Administered  Fluad Quad(high Dose 65+) 03/14/2019, 04/02/2020   Influenza Split 05/24/2011, 03/01/2013, 04/24/2015   Influenza Whole 03/27/2009   Influenza, High Dose Seasonal PF 02/29/2016, 04/07/2017, 05/24/2018   Influenza,inj,Quad PF,6+ Mos 04/03/2014   Pneumococcal Conjugate-13 04/03/2014, 04/07/2017   Pneumococcal Polysaccharide-23 03/27/2009   Tdap 03/11/2011   Zoster, Live 06/02/2014    MEDICATIONS/ALLERGIES   Current Meds  Medication Sig   acetaminophen (TYLENOL) 500 MG tablet Take 500 mg by mouth every 6 (six) hours as needed.   albuterol (PROVENTIL HFA;VENTOLIN HFA) 108 (90 Base) MCG/ACT inhaler Inhale 2 puffs into the lungs every 6 (six) hours as needed for wheezing or shortness of breath.   Biotin 10 MG TABS Take 1 tablet by mouth daily.   cetirizine (ZYRTEC) 10 MG  tablet Take 10 mg by mouth daily.   Cholecalciferol (VITAMIN D3) 50 MCG (2000 UT) TABS Take 1 tablet by mouth daily.   CRANBERRY PO Take 1 capsule by mouth daily.   diclofenac sodium (VOLTAREN) 1 % GEL Apply 4 g topically 4 (four) times daily as needed.   famciclovir (FAMVIR) 250 MG tablet Take 1 tablet (250 mg total) by mouth daily.   fluticasone (FLONASE) 50 MCG/ACT nasal spray Place 2 sprays into both nostrils as needed.    fluticasone (FLOVENT HFA) 220 MCG/ACT inhaler Inhale 2 puffs into the lungs 2 (two) times daily.   Fluticasone Furoate (ARNUITY ELLIPTA) 100 MCG/ACT AEPB Inhale 1 puff into the lungs daily.   losartan (COZAAR) 100 MG tablet Take 1 tablet (100 mg total) by mouth daily.   meloxicam (MOBIC) 15 MG tablet Take 15 mg by mouth daily.   Multiple Minerals-Vitamins (CAL-MAG-ZINC-D PO) Take 1 tablet by mouth daily.   Respiratory Therapy Supplies (FLUTTER) DEVI 1 Device by Does not apply route as directed.   rosuvastatin (CRESTOR) 10 MG tablet Take 1 tablet (10 mg total) by mouth daily.   vitamin B-12 (CYANOCOBALAMIN) 1000 MCG tablet Take 1,000 mcg by mouth daily.   vitamin E 180 MG (400 UNITS) capsule Take 400 Units by mouth daily.    Allergies  Allergen Reactions   Lipitor [Atorvastatin] Other (See Comments)    Aches at 40mg  dose Leg cramps    Amlodipine Besylate Swelling    SOCIAL HISTORY/FAMILY HISTORY   Reviewed in Epic:  Pertinent findings:  Social History   Tobacco Use   Smoking status: Never   Smokeless tobacco: Never  Vaping Use   Vaping Use: Never used  Substance Use Topics   Alcohol use: No    Alcohol/week: 0.0 standard drinks   Drug use: No   Social History   Social History Narrative   No living will , no HCPOA. Full code (reviewed 2015, given packet)    OBJCTIVE -PE, EKG, labs   Wt Readings from Last 3 Encounters:  04/15/21 177 lb 2 oz (80.3 kg)  03/16/21 178 lb (80.7 kg)  02/26/21 179 lb (81.2 kg)    Physical Exam: BP (!) 160/72 (BP  Location: Right Arm, Patient Position: Sitting, Cuff Size: Normal)   Pulse 92   Ht 5\' 6"  (1.676 m)   Wt 177 lb 2 oz (80.3 kg)   SpO2 97%   BMI 28.59 kg/m  Physical Exam Vitals reviewed.  Constitutional:      General: She is not in acute distress.    Appearance: Normal appearance. She is normal weight. She is not ill-appearing.  HENT:     Head: Normocephalic and atraumatic.     Ears:     Comments:  Hard of hearing Eyes:     Extraocular Movements: Extraocular movements intact.     Pupils: Pupils are equal, round, and reactive to light.  Neck:     Vascular: No carotid bruit or JVD.  Cardiovascular:     Rate and Rhythm: Normal rate and regular rhythm. No extrasystoles are present.    Chest Wall: PMI is not displaced.     Pulses: Normal pulses.     Heart sounds: Normal heart sounds, S1 normal and S2 normal. No murmur heard.   No friction rub. No gallop.  Pulmonary:     Effort: Pulmonary effort is normal. No respiratory distress.     Breath sounds: No wheezing, rhonchi or rales.     Comments: Mild diffuse faint interstitial sounds, but otherwise CTA B. Chest:     Chest wall: No tenderness.  Musculoskeletal:        General: No swelling. Normal range of motion.     Cervical back: Normal range of motion and neck supple.  Skin:    General: Skin is warm and dry.  Neurological:     General: No focal deficit present.     Mental Status: She is alert and oriented to person, place, and time.  Psychiatric:        Mood and Affect: Mood normal.        Behavior: Behavior normal.        Thought Content: Thought content normal.        Judgment: Judgment normal.     Adult ECG Report  Rate: 92 ;  Rhythm: normal sinus rhythm and possible left atrial enlargement, left axis deviation (-40 ), nonspecific ST and T wave changes. ; Criteria for prior Septal MI not present.  Narrative Interpretation: Borderline EKG.  Recent Labs: Reviewed Lab Results  Component Value Date   CHOL 177 08/12/2020    HDL 54.80 08/12/2020   LDLCALC 104 (H) 08/12/2020   LDLDIRECT 180.6 04/04/2011   TRIG 91.0 08/12/2020   CHOLHDL 3 08/12/2020   Lab Results  Component Value Date   CREATININE 1.11 09/01/2020   BUN 24 (H) 09/01/2020   NA 138 09/01/2020   K 4.7 09/01/2020   CL 103 09/01/2020   CO2 29 09/01/2020  ==================================================  COVID-19 Education: The signs and symptoms of COVID-19 were discussed with the patient and how to seek care for testing (follow up with PCP or arrange E-visit).    I spent a total of 25 minutes with the patient spent in direct patient consultation.  Additional time spent with chart review  / charting (studies, outside notes, etc): 21 min Total Time: 46 min  Current medicines are reviewed at length with the patient today.  (+/- concerns) none  This visit occurred during the SARS-CoV-2 public health emergency.  Safety protocols were in place, including screening questions prior to the visit, additional usage of staff PPE, and extensive cleaning of exam room while observing appropriate contact time as indicated for disinfecting solutions.  Notice: This dictation was prepared with Dragon dictation along with smart phrase technology. Any transcriptional errors that result from this process are unintentional and may not be corrected upon review.  Patient Instructions / Medication Changes & Studies & Tests Ordered   Patient Instructions  Medication Instructions:  No changes at this time.  *If you need a refill on your cardiac medications before your next appointment, please call your pharmacy*   Lab Work: CBC & BMP today  If you have labs (blood work) drawn today  and your tests are completely normal, you will receive your results only by: MyChart Message (if you have MyChart) OR A paper copy in the mail If you have any lab test that is abnormal or we need to change your treatment, we will call you to review the  results.   Testing/Procedures: Your physician has requested that you have an echocardiogram. Echocardiography is a painless test that uses sound waves to create images of your heart. It provides your doctor with information about the size and shape of your heart and how well your heart's chambers and valves are working. This procedure takes approximately one hour. There are no restrictions for this procedure.    Your cardiac CT is scheduled for Monday May 03, 2021 at 09:00 am at:   [facility] [place], [place] [address] [phone]  Please arrive at 08:45 am (15 mins early) for check-in and test prep.   On the Night Before the Test: Be sure to Drink plenty of water. Do not consume any caffeinated/decaffeinated beverages or chocolate 12 hours prior to your test. Do not take any antihistamines 12 hours prior to your test.  On the Day of the Test: Drink plenty of water until 1 hour prior to the test. Do not eat any food 4 hours prior to the test. You may take your regular medications prior to the test.  Take metoprolol (Lopressor) 100 mg two hours prior to test. FEMALES- please wear underwire-free bra if available, avoid dresses & tight clothing       After the Test: Drink plenty of water. After receiving IV contrast, you may experience a mild flushed feeling. This is normal. On occasion, you may experience a mild rash up to 24 hours after the test. This is not dangerous. If this occurs, you can take Benadryl 25 mg and increase your fluid intake. If you experience trouble breathing, this can be serious. If it is severe call 911 IMMEDIATELY. If it is mild, please call our office. If you take any of these medications: Glipizide/Metformin, Avandament, Glucavance, please do not take 48 hours after completing test unless otherwise instructed.  Please allow 2-4 weeks for scheduling of routine cardiac CTs. Some insurance  companies require a pre-authorization which may delay scheduling of this test.   For non-scheduling related questions, please contact the cardiac imaging nurse navigator should you have any questions/concerns: Marchia Bond, Cardiac Imaging Nurse Navigator Gordy Clement, Cardiac Imaging Nurse Navigator  [facility] Direct Office Dial: [phone]   For scheduling needs, including cancellations and rescheduling, please call [phone]    Follow-Up: At [facility], you and your health needs are our priority.  As part of our continuing mission to provide you with exceptional heart care, we have created designated Provider Care Teams.  These Care Teams include your primary Cardiologist (physician) and Advanced Practice Providers (APPs -  Physician Assistants and Nurse Practitioners) who all work together to provide you with the care you need, when you need it.  We recommend signing up for the patient portal called "MyChart".  Sign up information is provided on this After Visit Summary.  MyChart is used to connect with patients for Virtual Visits (Telemedicine).  Patients are able to view lab/test results, encounter notes, upcoming appointments, etc.  Non-urgent messages can be sent to your provider as well.   To learn more about what you can do with MyChart, go to [email or web address].    Your next appointment:  Follow up after testing has been done.   The format for your next appointment:   In Person  Provider:   You may see Dr. Glenetta Hew or one of the following Advanced Practice Providers on your designated Care Team:   Murray Hodgkins, NP Christell Faith, PA-C Marrianne Mood, PA-C Cadence Kathlen Mody, [place]   Studies Ordered:   Orders Placed This Encounter  Procedures   CT CORONARY MORPH W/CTA COR W/SCORE W/CA W/CM &/OR WO/CM   CBC   Basic metabolic panel   EKG [id number]   ECHOCARDIOGRAM COMPLETE     Glenetta Hew, M.D., M.S. Interventional  Cardiologist   Pager # [phone] Phone # [phone] [address] [place], [place] [address]   Thank you for choosing Heartcare at [facility]!!

## 2021-04-16 LAB — BASIC METABOLIC PANEL
BUN/Creatinine Ratio: 17 (ref 12–28)
BUN: 21 mg/dL (ref 8–27)
CO2: 25 mmol/L (ref 20–29)
Calcium: 9.4 mg/dL (ref 8.7–10.3)
Chloride: 100 mmol/L (ref 96–106)
Creatinine, Ser: 1.26 mg/dL — ABNORMAL HIGH (ref 0.57–1.00)
Glucose: 111 mg/dL — ABNORMAL HIGH (ref 70–99)
Potassium: 4.2 mmol/L (ref 3.5–5.2)
Sodium: 141 mmol/L (ref 134–144)
eGFR: 43 mL/min/{1.73_m2} — ABNORMAL LOW (ref >59)

## 2021-04-16 LAB — CBC
Hematocrit: 42.8 % (ref 34.0–46.6)
Hemoglobin: 14.7 g/dL (ref 11.1–15.9)
MCH: 30.3 pg (ref 26.6–33.0)
MCHC: 34.3 g/dL (ref 31.5–35.7)
MCV: 88 fL (ref 79–97)
Platelets: 242 10*3/uL (ref 150–450)
RBC: 4.85 x10E6/uL (ref 3.77–5.28)
RDW: 11.9 % (ref 11.7–15.4)
WBC: 7.9 10*3/uL (ref 3.4–10.8)

## 2021-04-19 ENCOUNTER — Telehealth: Payer: Self-pay | Admitting: Cardiology

## 2021-04-19 NOTE — Telephone Encounter (Signed)
Patty Man, MD  04/18/2021  9:36 PM EST     Lab results: Normal blood counts -> no anemia Chemistry panel shows slight reduction in kidney function.  Recommend increase oral fluid hydration especially leading up to CT scan.  Then will need to continue to drink at least 80 ounces daily for the next few days after CT scan.   Glenetta Hew, MD

## 2021-04-19 NOTE — Telephone Encounter (Signed)
Attempted to call the patient. No answer-  I left a detailed message of results on her cell # (ok per DPR).  I asked that she call back with any further questions/ concerns.

## 2021-04-20 ENCOUNTER — Ambulatory Visit: Payer: TRICARE For Life (TFL) | Admitting: Family Medicine

## 2021-04-23 ENCOUNTER — Ambulatory Visit (INDEPENDENT_AMBULATORY_CARE_PROVIDER_SITE_OTHER): Payer: Medicare Other | Admitting: Family Medicine

## 2021-04-23 ENCOUNTER — Encounter: Payer: Self-pay | Admitting: Family Medicine

## 2021-04-23 ENCOUNTER — Other Ambulatory Visit: Payer: Self-pay

## 2021-04-23 VITALS — BP 150/60 | HR 76 | Temp 98.3°F | Ht 66.0 in | Wt 179.1 lb

## 2021-04-23 DIAGNOSIS — I25118 Atherosclerotic heart disease of native coronary artery with other forms of angina pectoris: Secondary | ICD-10-CM

## 2021-04-23 DIAGNOSIS — I10 Essential (primary) hypertension: Secondary | ICD-10-CM | POA: Diagnosis not present

## 2021-04-23 DIAGNOSIS — M7061 Trochanteric bursitis, right hip: Secondary | ICD-10-CM | POA: Diagnosis not present

## 2021-04-23 DIAGNOSIS — M109 Gout, unspecified: Secondary | ICD-10-CM

## 2021-04-23 LAB — URIC ACID: Uric Acid, Serum: 6.9 mg/dL (ref 2.4–7.0)

## 2021-04-23 MED ORDER — MUPIROCIN CALCIUM 2 % NA OINT: TOPICAL_OINTMENT | NASAL | 0 refills | Status: DC

## 2021-04-23 NOTE — Patient Instructions (Addendum)
Consider stopping HCTZ when the medication is change by Dr. Ellyn Hack.  Please stop at the lab to have labs drawn.  Return to see ORTHO. For trochanteric bursitis/steroid injection... let me know if you need referral.

## 2021-04-23 NOTE — Assessment & Plan Note (Signed)
Inadequate control on losartan  100 mg daily and 12.5 mg daily of HCTZ   Per cardiology note.. plan on changing medication once cardiac CT and ECHO back for tighter BP control.  Will request.. when new med started to D/C HCTZ as possible trigger for her new Dx of gout.

## 2021-04-23 NOTE — Assessment & Plan Note (Signed)
New diagnosis, recent flare now resolved S/P prednisone taper.   Will check uric acid level. Reviewed low purine diet. Currently on HCTZ which may be triggering.. will discuss stopping HCTZ with cardiology given gout.

## 2021-04-23 NOTE — Progress Notes (Signed)
Patient ID: BRISTYL MCLEES, female    DOB: 1941/06/11, 80 y.o.   MRN: [id number]  This visit was conducted in person.  Pulse 76   Temp 98.3 F (36.8 C) (Temporal)   Ht 5' 6"  (1.676 m)   Wt 179 lb 2 oz (81.3 kg)   SpO2 94%   BMI 28.91 kg/m    CC: Chief Complaint  Patient presents with   Follow-up    UC visit 04/03/2021-Gout Flare & HTN   Hip Pain    Right-Radiates down to leg    Subjective:   HPI: Patty Shelton is a 80 y.o. female presenting on 04/23/2021 for Follow-up (UC visit 04/03/2021-Gout Flare & HTN) and Hip Pain (Right-Radiates down to leg)   Reviewed Urgent Care note from 04/03/21  Seen for gout flare in right great toe.  Treated with prednisone taper... improving swiftly. No current pain in right toe, no swelling, full ROM.   No family history of gout.  No known trigger    Hypertension:  At [facility] in 03/2021 BP was elevated despite HCTZ 12.5 mg and losartan 100 mg daily Saw Dr. Ellyn Hack Cardiology  on 04/15/2021 for HTN and SOB... Plan doing Cardiac CT, ECHO to evaluate.  BP Readings from Last 3 Encounters:  04/23/21 (!) 150/60  04/15/21 (!) 160/72  04/03/21 (!) 167/73  Using medication without problems or lightheadedness:  none Chest pain with exertion: none Edema:none Short of breath: stable Average home BPs: 152-156/52-62 Other issues:  She has been having 1 week of pain in right lateral hip and radiating down leg.  When lies on opposite side, pulling right hip hurts.  No numbness, no weakness in leg.   Pain greatest with walking up stairs.   Has history of troch bursitis.  Has tried home PT. Relevant past medical, surgical, family and social history reviewed and updated as indicated. Interim medical history since our last visit reviewed. Allergies and medications reviewed and updated. Outpatient Medications Prior to Visit  Medication Sig Dispense Refill   acetaminophen (TYLENOL) 500 MG tablet Take 500 mg by mouth every 6 (six) hours as  needed.     albuterol (PROVENTIL HFA;VENTOLIN HFA) 108 (90 Base) MCG/ACT inhaler Inhale 2 puffs into the lungs every 6 (six) hours as needed for wheezing or shortness of breath. 1 Inhaler 2   Biotin 10 MG TABS Take 1 tablet by mouth daily.     cetirizine (ZYRTEC) 10 MG tablet Take 10 mg by mouth daily.     Cholecalciferol (VITAMIN D3) 50 MCG (2000 UT) TABS Take 1 tablet by mouth daily.     CRANBERRY PO Take 1 capsule by mouth daily.     diclofenac sodium (VOLTAREN) 1 % GEL Apply 4 g topically 4 (four) times daily as needed.     famciclovir (FAMVIR) 250 MG tablet Take 1 tablet (250 mg total) by mouth daily. 90 tablet 3   fluticasone (FLONASE) 50 MCG/ACT nasal spray Place 2 sprays into both nostrils as needed.      Fluticasone Furoate (ARNUITY ELLIPTA) 100 MCG/ACT AEPB Inhale 1 puff into the lungs daily. 90 each 3   hydrochlorothiazide (HYDRODIURIL) 25 MG tablet Take 12.5 mg by mouth daily.     losartan (COZAAR) 100 MG tablet Take 1 tablet (100 mg total) by mouth daily. 90 tablet 3   Multiple Minerals-Vitamins (CAL-MAG-ZINC-D PO) Take 1 tablet by mouth daily.     mupirocin nasal ointment (BACTROBAN) 2 % Use small amount in each nostril twice daily  for five (5) days. After application, press sides of nose together and gently massage. 10 g 0   Respiratory Therapy Supplies (FLUTTER) DEVI 1 Device by Does not apply route as directed. 1 each 0   rosuvastatin (CRESTOR) 10 MG tablet Take 1 tablet (10 mg total) by mouth daily. 90 tablet 3   vitamin B-12 (CYANOCOBALAMIN) 1000 MCG tablet Take 1,000 mcg by mouth daily.     vitamin E 180 MG (400 UNITS) capsule Take 400 Units by mouth daily.     meloxicam (MOBIC) 15 MG tablet Take 15 mg by mouth daily.     predniSONE (STERAPRED UNI-PAK 21 TAB) 10 MG (21) TBPK tablet Take by mouth daily. As directed 21 tablet 0   fluticasone (FLOVENT HFA) 220 MCG/ACT inhaler Inhale 2 puffs into the lungs 2 (two) times daily. 3 each 1   metoprolol tartrate (LOPRESSOR) 100 MG  tablet Take 1 tablet (100 mg total) by mouth once for 1 dose. Take 2 hours prior to your scheduled test. 1 tablet 0   No facility-administered medications prior to visit.     Per HPI unless specifically indicated in ROS section below Review of Systems  Constitutional:  Negative for fatigue and fever.  HENT:  Negative for congestion.   Eyes:  Negative for pain.  Respiratory:  Negative for cough and shortness of breath.   Cardiovascular:  Negative for chest pain, palpitations and leg swelling.  Gastrointestinal:  Negative for abdominal pain.  Genitourinary:  Negative for dysuria and vaginal bleeding.  Musculoskeletal:  Negative for back pain.  Neurological:  Negative for syncope, light-headedness and headaches.  Psychiatric/Behavioral:  Negative for dysphoric mood.   Objective:  Pulse 76   Temp 98.3 F (36.8 C) (Temporal)   Ht 5' 6"  (1.676 m)   Wt 179 lb 2 oz (81.3 kg)   SpO2 94%   BMI 28.91 kg/m   Wt Readings from Last 3 Encounters:  04/23/21 179 lb 2 oz (81.3 kg)  04/15/21 177 lb 2 oz (80.3 kg)  03/16/21 178 lb (80.7 kg)      Physical Exam Constitutional:      General: She is not in acute distress.    Appearance: Normal appearance. She is well-developed. She is not ill-appearing or toxic-appearing.  HENT:     Head: Normocephalic.     Right Ear: Hearing, tympanic membrane, ear canal and external ear normal. Tympanic membrane is not erythematous, retracted or bulging.     Left Ear: Hearing, tympanic membrane, ear canal and external ear normal. Tympanic membrane is not erythematous, retracted or bulging.     Nose: No mucosal edema or rhinorrhea.     Right Sinus: No maxillary sinus tenderness or frontal sinus tenderness.     Left Sinus: No maxillary sinus tenderness or frontal sinus tenderness.     Mouth/Throat:     Pharynx: Uvula midline.  Eyes:     General: Lids are normal. Lids are everted, no foreign bodies appreciated.     Conjunctiva/sclera: Conjunctivae normal.      Pupils: Pupils are equal, round, and reactive to light.  Neck:     Thyroid: No thyroid mass or thyromegaly.     Vascular: No carotid bruit.     Trachea: Trachea normal.  Cardiovascular:     Rate and Rhythm: Normal rate and regular rhythm.     Pulses: Normal pulses.     Heart sounds: Normal heart sounds, S1 normal and S2 normal. No murmur heard.   No friction rub. No  gallop.  Pulmonary:     Effort: Pulmonary effort is normal. No tachypnea or respiratory distress.     Breath sounds: Normal breath sounds. No decreased breath sounds, wheezing, rhonchi or rales.  Abdominal:     General: Bowel sounds are normal.     Palpations: Abdomen is soft.     Tenderness: There is no abdominal tenderness.  Musculoskeletal:     Cervical back: Normal, normal range of motion and neck supple.     Thoracic back: Normal.     Lumbar back: Normal. No tenderness or bony tenderness. Normal range of motion. Negative right straight leg raise test and negative left straight leg raise test.     Right hip: Tenderness and bony tenderness present. Decreased range of motion.     Comments: Equal ROm in right and left hip  TTP laterally over right troch burs  Skin:    General: Skin is warm and dry.     Findings: No rash.  Neurological:     Mental Status: She is alert.  Psychiatric:        Mood and Affect: Mood is not anxious or depressed.        Speech: Speech normal.        Behavior: Behavior normal. Behavior is cooperative.        Thought Content: Thought content normal.        Judgment: Judgment normal.      Results for orders placed or performed in visit on 04/15/21  CBC  Result Value Ref Range   WBC 7.9 3.4 - 10.8 x10E3/uL   RBC 4.85 3.77 - 5.28 x10E6/uL   Hemoglobin 14.7 11.1 - 15.9 g/dL   Hematocrit 42.8 34.0 - 46.6 %   MCV 88 79 - 97 fL   MCH 30.3 26.6 - 33.0 pg   MCHC 34.3 31.5 - 35.7 g/dL   RDW 11.9 11.7 - 15.4 %   Platelets 242 150 - 450 O96E9/BM  Basic metabolic panel  Result Value Ref Range    Glucose 111 (H) 70 - 99 mg/dL   BUN 21 8 - 27 mg/dL   Creatinine, Ser 1.26 (H) 0.57 - 1.00 mg/dL   eGFR 43 (L) >59 mL/min/1.73   BUN/Creatinine Ratio 17 12 - 28   Sodium 141 134 - 144 mmol/L   Potassium 4.2 3.5 - 5.2 mmol/L   Chloride 100 96 - 106 mmol/L   CO2 25 20 - 29 mmol/L   Calcium 9.4 8.7 - 10.3 mg/dL    This visit occurred during the SARS-CoV-2 public health emergency.  Safety protocols were in place, including screening questions prior to the visit, additional usage of staff PPE, and extensive cleaning of exam room while observing appropriate contact time as indicated for disinfecting solutions.   COVID 19 screen:  No recent travel or known exposure to COVID19 The patient denies respiratory symptoms of COVID 19 at this time. The importance of social distancing was discussed today.   Assessment and Plan Problem List Items Addressed This Visit     Primary hypertension (Chronic)     Inadequate control on losartan  100 mg daily and 12.5 mg daily of HCTZ   Per cardiology note.. plan on changing medication once cardiac CT and ECHO back for tighter BP control.  Will request.. when new med started to D/C HCTZ as possible trigger for her new Dx of gout.      Relevant Medications   hydrochlorothiazide (HYDRODIURIL) 25 MG tablet   Acute gout involving toe  of right foot - Primary    New diagnosis, recent flare now resolved S/P prednisone taper.   Will check uric acid level. Reviewed low purine diet. Currently on HCTZ which may be triggering.. will discuss stopping HCTZ with cardiology given gout.      Relevant Orders   Uric Acid   Trochanteric bursitis of right hip     No improvement with meloxicam, prednisone.  recommend return to [facility] for steroid injection.          Eliezer Lofts, MD

## 2021-04-23 NOTE — Assessment & Plan Note (Signed)
No improvement with meloxicam, prednisone.  recommend return to [facility] for steroid injection.

## 2021-04-26 DIAGNOSIS — M7061 Trochanteric bursitis, right hip: Secondary | ICD-10-CM | POA: Diagnosis not present

## 2021-04-30 ENCOUNTER — Telehealth (HOSPITAL_COMMUNITY): Payer: Self-pay | Admitting: *Deleted

## 2021-04-30 NOTE — Telephone Encounter (Signed)
Attempted to call patient regarding upcoming cardiac CT appointment. °Left message on voicemail with name and callback number ° °Aleane Wesenberg RN Navigator Cardiac Imaging °[facility] °[phone] Office °[phone] Cell ° °

## 2021-05-03 ENCOUNTER — Ambulatory Visit
Admission: RE | Admit: 2021-05-03 | Discharge: 2021-05-03 | Disposition: A | Discharge status: Home / Self Care | Payer: Medicare Other | Source: Ambulatory Visit | Attending: Cardiology | Admitting: Cardiology

## 2021-05-03 ENCOUNTER — Other Ambulatory Visit: Payer: Self-pay

## 2021-05-03 DIAGNOSIS — R072 Precordial pain: Secondary | ICD-10-CM | POA: Diagnosis not present

## 2021-05-03 MED ORDER — IOHEXOL 350 MG/ML SOLN
75.0000 mL | Freq: Once | INTRAVENOUS | Status: AC | PRN
  Administered 2021-05-03: 75 mL via INTRAVENOUS

## 2021-05-03 MED ORDER — NITROGLYCERIN 0.4 MG SL SUBL
0.8000 mg | SUBLINGUAL_TABLET | Freq: Once | SUBLINGUAL | Status: AC
  Administered 2021-05-03: 0.8 mg via SUBLINGUAL

## 2021-05-03 MED ORDER — METOPROLOL TARTRATE 5 MG/5ML IV SOLN: 10.0000 mg | Freq: Once | INTRAVENOUS | Status: DC

## 2021-05-03 NOTE — Progress Notes (Signed)
Patient tolerated CT well. Drank water after. Vital signs stable encourage to drink water throughout day.Reasons explained and verbalized understanding. Ambulated steady gait.  

## 2021-05-04 DIAGNOSIS — R931 Abnormal findings on diagnostic imaging of heart and coronary circulation: Secondary | ICD-10-CM

## 2021-05-04 HISTORY — PX: CT CTA CORONARY W/CA SCORE W/CM &/OR WO/CM: HXRAD787

## 2021-05-04 HISTORY — DX: Abnormal findings on diagnostic imaging of heart and coronary circulation: R93.1

## 2021-05-13 ENCOUNTER — Ambulatory Visit: Payer: TRICARE For Life (TFL) | Admitting: Dermatology

## 2021-05-27 ENCOUNTER — Ambulatory Visit (INDEPENDENT_AMBULATORY_CARE_PROVIDER_SITE_OTHER): Payer: Medicare Other

## 2021-05-27 ENCOUNTER — Other Ambulatory Visit: Payer: Self-pay

## 2021-05-27 DIAGNOSIS — R0609 Other forms of dyspnea: Secondary | ICD-10-CM | POA: Diagnosis not present

## 2021-05-27 DIAGNOSIS — R072 Precordial pain: Secondary | ICD-10-CM | POA: Diagnosis not present

## 2021-05-27 HISTORY — PX: TRANSTHORACIC ECHOCARDIOGRAM: SHX275

## 2021-05-27 LAB — ECHOCARDIOGRAM COMPLETE
AR max vel: 1.97 cm2
AV Area VTI: 2.16 cm2
AV Area mean vel: 1.86 cm2
AV Mean grad: 5 mmHg
AV Peak grad: 8.2 mmHg
Ao pk vel: 1.43 m/s
Area-P 1/2: 2.72 cm2
Calc EF: 53.9 %
S' Lateral: 3.3 cm
Single Plane A2C EF: 53.5 %
Single Plane A4C EF: 54.6 %

## 2021-05-31 ENCOUNTER — Other Ambulatory Visit: Payer: Self-pay

## 2021-05-31 ENCOUNTER — Ambulatory Visit (INDEPENDENT_AMBULATORY_CARE_PROVIDER_SITE_OTHER): Payer: Medicare Other | Admitting: Dermatology

## 2021-05-31 DIAGNOSIS — I25118 Atherosclerotic heart disease of native coronary artery with other forms of angina pectoris: Secondary | ICD-10-CM | POA: Diagnosis not present

## 2021-05-31 DIAGNOSIS — L578 Other skin changes due to chronic exposure to nonionizing radiation: Secondary | ICD-10-CM | POA: Diagnosis not present

## 2021-05-31 DIAGNOSIS — L820 Inflamed seborrheic keratosis: Secondary | ICD-10-CM | POA: Diagnosis not present

## 2021-05-31 DIAGNOSIS — L821 Other seborrheic keratosis: Secondary | ICD-10-CM | POA: Diagnosis not present

## 2021-05-31 NOTE — Progress Notes (Signed)
° °  Follow-Up Visit   Subjective  Patty Shelton is a 80 y.o. female who presents for the following: Irregular skin lesions (Itchy and irritated on the chest and face - patient tried using OTC Compound W for the lesions on the chest but it didn't resolve skin lesions.). The patient has spots, moles and lesions to be evaluated, some may be new or changing.   The following portions of the chart were reviewed this encounter and updated as appropriate:   Tobacco   Allergies   Meds   Problems   Med Hx   Surg Hx   Fam Hx      Review of Systems:  No other skin or systemic complaints except as noted in HPI or Assessment and Plan.  Objective  Well appearing patient in no apparent distress; mood and affect are within normal limits.  A focused examination was performed including the face and chest. Relevant physical exam findings are noted in the Assessment and Plan.  Chest x 4, L cheek x 1, L forehead x 3 (8) Erythematous stuck-on, waxy papule or plaque   Assessment & Plan  Inflamed seborrheic keratosis (8) Chest x 4, L cheek x 1, L forehead x 3  Destruction of lesion - Chest x 4, L cheek x 1, L forehead x 3 Complexity: simple   Destruction method: cryotherapy   Informed consent: discussed and consent obtained   Timeout:  patient name, date of birth, surgical site, and procedure verified Lesion destroyed using liquid nitrogen: Yes   Region frozen until ice ball extended beyond lesion: Yes   Outcome: patient tolerated procedure well with no complications   Post-procedure details: wound care instructions given    Seborrheic Keratoses - Stuck-on, waxy, tan-brown papules and/or plaques  - Benign-appearing - Discussed benign etiology and prognosis. - Observe - Call for any changes  Actinic Damage - chronic, secondary to cumulative UV radiation exposure/sun exposure over time - diffuse scaly erythematous macules with underlying dyspigmentation - Recommend daily broad spectrum sunscreen  SPF 30+ to sun-exposed areas, reapply every 2 hours as needed.  - Recommend staying in the shade or wearing long sleeves, sun glasses (UVA+UVB protection) and wide brim hats (4-inch brim around the entire circumference of the hat). - Call for new or changing lesions.  Return in about 6 months (around 11/29/2021) for TBSE.  Luther Redo, CMA, am acting as scribe for Sarina Ser, MD . Documentation: I have reviewed the above documentation for accuracy and completeness, and I agree with the above.  Sarina Ser, MD

## 2021-05-31 NOTE — Patient Instructions (Signed)

## 2021-06-03 ENCOUNTER — Encounter: Payer: Self-pay | Admitting: Dermatology

## 2021-06-04 ENCOUNTER — Encounter: Payer: Self-pay | Admitting: Emergency Medicine

## 2021-06-04 ENCOUNTER — Ambulatory Visit
Admission: EM | Admit: 2021-06-04 | Discharge: 2021-06-04 | Disposition: A | Discharge status: Home / Self Care | Payer: Medicare Other | Attending: Medical Oncology | Admitting: Medical Oncology

## 2021-06-04 ENCOUNTER — Ambulatory Visit (INDEPENDENT_AMBULATORY_CARE_PROVIDER_SITE_OTHER): Payer: Medicare Other

## 2021-06-04 DIAGNOSIS — J440 Chronic obstructive pulmonary disease with acute lower respiratory infection: Secondary | ICD-10-CM

## 2021-06-04 DIAGNOSIS — R0602 Shortness of breath: Secondary | ICD-10-CM

## 2021-06-04 DIAGNOSIS — R059 Cough, unspecified: Secondary | ICD-10-CM

## 2021-06-04 MED ORDER — ALBUTEROL SULFATE HFA 108 (90 BASE) MCG/ACT IN AERS: 2.0000 | INHALATION_SPRAY | Freq: Four times a day (QID) | RESPIRATORY_TRACT | 2 refills | Status: DC | PRN

## 2021-06-04 MED ORDER — PREDNISONE 10 MG PO TABS: ORAL_TABLET | ORAL | 0 refills | Status: DC

## 2021-06-04 NOTE — ED Provider Notes (Signed)
Roderic Palau    CSN: [id number] Arrival date & time: 06/04/21  0941      History   Chief Complaint Chief Complaint  Patient presents with   Shortness of Breath   Nasal Congestion   Cough    HPI GISELLE BRUTUS is a 80 y.o. female.   HPI  SOB: Patient states that she developed URI symptoms of dry cough, nasal congestion on Wednesday. Then this morning she noticed that she felt SOB- moe than normal for her. No chest pain, hemoptysis, peripheral edema. She has had low grade fevers since Wednesday. She has tried her normal medications for symptoms with mild improvement. No known sick contacts. She declines influenza and COVID-19 testing despite advisement.   Past Medical History:  Diagnosis Date   Hyperlipemia    Hypertension     Patient Active Problem List   Diagnosis Date Noted   Trochanteric bursitis of right hip 04/23/2021   Acute gout involving toe of right foot 04/23/2021   Acute bilateral low back pain without sciatica 12/18/2020   Bronchiectasis without complication ([place]) 09/98/3382   ILD (interstitial lung disease) ([place]) 04/02/2020   Nail, injury by 02/21/2019   Capsulitis of metatarsophalangeal (MTP) joint of left foot 02/21/2019   Hav (hallux abducto valgus), left 02/21/2019   Coronary atherosclerosis 10/11/2018   Dysphagia 03/28/2017   GERD (gastroesophageal reflux disease) 03/28/2017   MRSA (methicillin resistant staph aureus) culture positive 03/28/2017   Mixed incontinence 06/24/2016   CKD (chronic kidney disease) stage 3, GFR 30-59 ml/min ([place]) 05/22/2015   Advance directive discussed with patient 05/22/2015   DOE (dyspnea on exertion) 04/03/2014   Hearing loss 03/01/2013   Herpes genitalia 04/28/2011   Osteopenia 03/22/2011   Primary hypertension 02/11/2011   HYPERCHOLESTEROLEMIA 03/27/2009   Allergic rhinitis 03/27/2009    Past Surgical History:  Procedure Laterality Date   CATARACT EXTRACTION     RETINAL DETACHMENT SURGERY      OB  History   No obstetric history on file.      Home Medications    Prior to Admission medications   Medication Sig Start Date End Date Taking? Authorizing Provider  acetaminophen (TYLENOL) 500 MG tablet Take 500 mg by mouth every 6 (six) hours as needed.    [provider]  albuterol (PROVENTIL HFA;VENTOLIN HFA) 108 (90 Base) MCG/ACT inhaler Inhale 2 puffs into the lungs every 6 (six) hours as needed for wheezing or shortness of breath. 11/27/16   Delman Kitten, MD  Biotin 10 MG TABS Take 1 tablet by mouth daily.    [provider]  cetirizine (ZYRTEC) 10 MG tablet Take 10 mg by mouth daily.    [provider]  Cholecalciferol (VITAMIN D3) 50 MCG (2000 UT) TABS Take 1 tablet by mouth daily.    [provider]  CRANBERRY PO Take 1 capsule by mouth daily.    [provider]  diclofenac sodium (VOLTAREN) 1 % GEL Apply 4 g topically 4 (four) times daily as needed.    [provider]  famciclovir (FAMVIR) 250 MG tablet Take 1 tablet (250 mg total) by mouth daily. 08/18/20   Bedsole, Amy E, MD  fluticasone (FLONASE) 50 MCG/ACT nasal spray Place 2 sprays into both nostrils as needed.     [provider]  Fluticasone Furoate (ARNUITY ELLIPTA) 100 MCG/ACT AEPB Inhale 1 puff into the lungs daily. 10/14/20   Flora Lipps, MD  hydrochlorothiazide (HYDRODIURIL) 25 MG tablet Take 12.5 mg by mouth daily.  [provider]  losartan (COZAAR) 100 MG tablet Take 1 tablet (100 mg total) by mouth daily. 03/16/21   Bedsole, Amy E, MD  Multiple Minerals-Vitamins (CAL-MAG-ZINC-D PO) Take 1 tablet by mouth daily.    [provider]  mupirocin nasal ointment (BACTROBAN) 2 % Use small amount in each nostril twice daily for five (5) days. After application, press sides of nose together and gently massage. Patient not taking: Reported on 05/31/2021 04/23/21   Jinny Sanders, MD  Respiratory Therapy Supplies (FLUTTER) DEVI 1 Device by Does not apply  route as directed. 10/15/18   Flora Lipps, MD  rosuvastatin (CRESTOR) 10 MG tablet Take 1 tablet (10 mg total) by mouth daily. 08/18/20   Bedsole, Amy E, MD  vitamin B-12 (CYANOCOBALAMIN) 1000 MCG tablet Take 1,000 mcg by mouth daily.    [provider]  vitamin E 180 MG (400 UNITS) capsule Take 400 Units by mouth daily.    [provider]    Family History Family History  Problem Relation Age of Onset   Heart attack Mother 30       Apparently she started having issues at 5, but not sure if she had a heart attack at that time   Coronary artery disease Mother 68   Stroke Father    Hyperlipidemia Father    Dementia Father     Social History Social History   Tobacco Use   Smoking status: Never   Smokeless tobacco: Never  Vaping Use   Vaping Use: Never used  Substance Use Topics   Alcohol use: No    Alcohol/week: 0.0 standard drinks   Drug use: No     Allergies   Lipitor [atorvastatin] and Amlodipine besylate   Review of Systems Review of Systems  As stated above in HPI Physical Exam Triage Vital Signs ED Triage Vitals  Enc Vitals Group     BP 06/04/21 0959 112/87     Pulse Rate 06/04/21 0959 80     Resp 06/04/21 0959 20     Temp 06/04/21 0959 99 F (37.2 C)     Temp src --      SpO2 06/04/21 0959 96 %     Weight --      Height --      Head Circumference --      Peak Flow --      Pain Score 06/04/21 0954 0     Pain Loc --      Pain Edu? --      Excl. in [place]? --    No data found.  Updated Vital Signs BP 112/87    Pulse 80    Temp 99 F (37.2 C)    Resp 20    SpO2 96%   Physical Exam Vitals and nursing note reviewed.  Constitutional:      General: She is not in acute distress.    Appearance: She is well-developed. She is not ill-appearing, toxic-appearing or diaphoretic.  HENT:     Head: Normocephalic and atraumatic.     Mouth/Throat:     Mouth: Mucous membranes are moist.     Pharynx: Oropharynx is clear. No pharyngeal swelling or  oropharyngeal exudate.  Eyes:     Extraocular Movements: Extraocular movements intact.     Pupils: Pupils are equal, round, and reactive to light.     Comments: No pallor  Neck:     Thyroid: No thyromegaly.  Cardiovascular:     Rate and Rhythm: Normal rate  and regular rhythm.  Pulmonary:     Effort: Pulmonary effort is normal.     Breath sounds: Normal breath sounds. No decreased breath sounds, wheezing, rhonchi or rales.  Chest:     Chest wall: No mass, deformity or edema.  Musculoskeletal:     Cervical back: Normal range of motion and neck supple.     Right lower leg: No edema.     Left lower leg: No edema.  Lymphadenopathy:     Cervical: No cervical adenopathy.  Skin:    General: Skin is warm.     Capillary Refill: Capillary refill takes less than 2 seconds.     Coloration: Skin is not cyanotic or pale.     Nails: There is no clubbing.  Neurological:     General: No focal deficit present.     Mental Status: She is alert and oriented to person, place, and time.     Cranial Nerves: No cranial nerve deficit.     Motor: No weakness.  Psychiatric:        Mood and Affect: Mood normal.        Behavior: Behavior normal.     UC Treatments / Results  Labs (all labs ordered are listed, but only abnormal results are displayed) Labs Reviewed - No data to display  EKG   Radiology No results found.  Procedures Procedures (including critical care time)  Medications Ordered in UC Medications - No data to display  Initial Impression / Assessment and Plan / UC Course  I have reviewed the triage vital signs and the nursing notes.  Pertinent labs & imaging results that were available during my care of the patient were reviewed by me and considered in my medical decision making (see chart for details).     New. She declines viral testing despite advisement and she reports that she would not want to take anti viral testing if positive. EKG appears stable and does not suggest  acute abnormality. Chest x ray is stable. Since her inhaler has helped some and given recent cold symptoms this likely is viral in nature but we did discuss other possibilities. Will treat with prednisone and albuterol along with her normal medications. Discussed red flag signs and symptoms.    Final Clinical Impressions(s) / UC Diagnoses   Final diagnoses:  None   Discharge Instructions   None    ED Prescriptions   None    PDMP not reviewed this encounter.   Hughie Closs, [place] 06/04/21 1046

## 2021-06-04 NOTE — ED Triage Notes (Signed)
PT here with URI sx starting on Wednesday and woke up today very short of breath.

## 2021-06-15 ENCOUNTER — Ambulatory Visit: Payer: Medicare Other | Admitting: Nurse Practitioner

## 2021-06-17 ENCOUNTER — Ambulatory Visit: Payer: Medicare Other | Admitting: Cardiology

## 2021-06-21 ENCOUNTER — Ambulatory Visit: Payer: Medicare Other | Admitting: Nurse Practitioner

## 2021-07-07 ENCOUNTER — Other Ambulatory Visit: Payer: Self-pay

## 2021-07-07 ENCOUNTER — Ambulatory Visit (INDEPENDENT_AMBULATORY_CARE_PROVIDER_SITE_OTHER): Payer: Medicare Other | Admitting: Nurse Practitioner

## 2021-07-07 ENCOUNTER — Encounter: Payer: Self-pay | Admitting: Nurse Practitioner

## 2021-07-07 VITALS — BP 140/70 | HR 82 | Temp 98.4°F | Ht 66.5 in | Wt 173.0 lb

## 2021-07-07 DIAGNOSIS — J309 Allergic rhinitis, unspecified: Secondary | ICD-10-CM

## 2021-07-07 DIAGNOSIS — J479 Bronchiectasis, uncomplicated: Secondary | ICD-10-CM

## 2021-07-07 DIAGNOSIS — R0609 Other forms of dyspnea: Secondary | ICD-10-CM

## 2021-07-07 DIAGNOSIS — J471 Bronchiectasis with (acute) exacerbation: Secondary | ICD-10-CM

## 2021-07-07 DIAGNOSIS — B370 Candidal stomatitis: Secondary | ICD-10-CM | POA: Diagnosis not present

## 2021-07-07 DIAGNOSIS — J4531 Mild persistent asthma with (acute) exacerbation: Secondary | ICD-10-CM | POA: Diagnosis not present

## 2021-07-07 DIAGNOSIS — J849 Interstitial pulmonary disease, unspecified: Secondary | ICD-10-CM | POA: Diagnosis not present

## 2021-07-07 DIAGNOSIS — J45909 Unspecified asthma, uncomplicated: Secondary | ICD-10-CM | POA: Insufficient documentation

## 2021-07-07 MED ORDER — NYSTATIN 100000 UNIT/ML MT SUSP: 5.0000 mL | Freq: Four times a day (QID) | OROMUCOSAL | 0 refills | Status: DC

## 2021-07-07 MED ORDER — NYSTATIN 100000 UNIT/ML MT SUSP: 5.0000 mL | Freq: Four times a day (QID) | OROMUCOSAL | 0 refills | Status: AC

## 2021-07-07 MED ORDER — ADVAIR HFA 115-21 MCG/ACT IN AERO: 2.0000 | INHALATION_SPRAY | Freq: Two times a day (BID) | RESPIRATORY_TRACT | 12 refills | Status: DC

## 2021-07-07 MED ORDER — PREDNISONE 20 MG PO TABS
40.0000 mg | ORAL_TABLET | Freq: Every day | ORAL | 0 refills | Status: AC
Start: 2021-07-07 — End: 2021-07-12

## 2021-07-07 MED ORDER — GUAIFENESIN ER 600 MG PO TB12: 600.0000 mg | ORAL_TABLET | Freq: Two times a day (BID) | ORAL | 2 refills | Status: DC

## 2021-07-07 NOTE — Assessment & Plan Note (Addendum)
Increased DOE over past 6 months. PFTs in April showed mild obstructive disease with positive bronchodilator response, consistent with asthma. Mild flare in wheezing recently. Prednisone 40 mg for 5 days.Trial step up therapy to Advair 115 2 puffs Twice daily. Cough relatively unchanged but could consider addition of hypertonic saline nebs. Repeat HRCT ordered for further eval of dyspnea.

## 2021-07-07 NOTE — Assessment & Plan Note (Signed)
See above plan. 

## 2021-07-07 NOTE — Assessment & Plan Note (Addendum)
Multifactorial related to asthma, BTX/ILD and deconditioning. No remarkable findings from cardiology standpoint. Step up trial therapy to ICS/LABA. HRCT.

## 2021-07-07 NOTE — Assessment & Plan Note (Signed)
Well-controlled with flonase and zyrtec. Continue therapies.

## 2021-07-07 NOTE — Assessment & Plan Note (Addendum)
Mucinex 600 mg Twice daily. Encouraged compliance with flutter valve therapy.  Cough relatively unchanged but could consider addition of hypertonic saline nebs. Repeat HRCT ordered for evaluation for progression d/t increased DOE; however, I suspect this is r/t to her asthma.  Patient Instructions  -Stop Arnuity. Trial step up therapy to Advair 115 2 puffs Twice daily. Brush tongue and rinse mouth afterwards.  -Continue Albuterol inhaler 2 puffs every 6 hours as needed for shortness of breath or wheezing. Notify if symptoms persist despite rescue inhaler/neb use -Continue flonase 2 sprays each nostril daily -Continue flutter valve therapy -Continue zyrtec 10 mg daily   -Prednisone 40 mg daily for 5 days. Take in the AM with food. -Mucinex 600 mg Twice daily  -Nystatin oral rinse 5 mL four times daily for 10 days. Swish and swallow.   High resolution CT chest ordered. Someone will contact you for scheduling.   Follow up in one month with Dr. Mortimer Fries or Alanson Aly. If symptoms do not improve or worsen, please contact office for sooner follow up or seek emergency care.

## 2021-07-07 NOTE — Assessment & Plan Note (Signed)
Thrush present on exam. Educated on importance of oral hygiene post ICS use. Nystatin rinse 5 mL four times a day for 10 days.

## 2021-07-07 NOTE — Addendum Note (Signed)
Addended by: Clayton Bibles on: 07/07/2021 11:46 AM   Modules accepted: Orders

## 2021-07-07 NOTE — Progress Notes (Addendum)
@Patient  ID: Patty Shelton, female    DOB: Apr 26, 1941, 81 y.o.   MRN: [id number]  Chief Complaint  Patient presents with   Follow-up    She reports that she has been more short of breath with exertion.     Referring provider: Jinny Sanders, MD  HPI: 81 year old female, never smoker followed for bronchiectasis and ILD. She is a patient of Dr. Zoila Shutter and last seen in office on 10/08/2020. Past medical history significant for HTN, allergic rhinitis, dysphagia, GERD, gout, CKD stage III, HLD.   TEST/EVENTS:  10/10/2018 HRCT chest: atherosclerosis. Hypodense 1.1 cm right thyroid nodule. Mild patchy ground-glass opacity and minimal reticulation throughout both lungs, predominantly in peripheral lower lobes. Associated mild traction bronchiectasis and minimal architectural distortion. No frank honeycombing. No significant progression. Small hiatal hernia. Subcm hypodense anterior left liver lobe lesion, considered benign.  09/28/2020 PFTs: FVC 2.36 (86), FEV1 1.7 (83), ratio 71, TLC 103. +BD 05/27/2021 echocardiogram: EF 55-60%, G1DD with LA mild dilation.   10/08/2020: OV with Dr. Mortimer Fries. No significant changes; minimal productive cough. Continued Arnuity and advised more frequent use of flutter valve.   07/07/2021: Today - acute visit Patient presents today for increased shortness of breath. She states this has been progressive over the past six months and she notices worsening SOB with activities such as going to the mailbox or cleaning. She continues to experience a daily productive cough, which is unchanged from baseline, and an occasional wheeze. She denies orthopnea, PND, chest pain, or lower extremity swelling. No fevers, chills, or hemoptysis. She was recently worked up by cardiology for [place] without any remarkable findings. She continues on Arnuity daily, flonase, and zyrtec. She does not use her rescue inhaler often. She reports not being the best at remembering to do her flutter valve therapy.    Allergies  Allergen Reactions   Amlodipine Besylate Swelling   Lipitor [Atorvastatin] Other (See Comments)    Aches at 40mg  dose Leg cramps     Immunization History  Administered Date(s) Administered   Fluad Quad(high Dose 65+) 03/14/2019, 04/02/2020   Influenza Split 05/24/2011, 03/01/2013, 04/24/2015   Influenza Whole 03/27/2009   Influenza, High Dose Seasonal PF 02/29/2016, 04/07/2017, 05/24/2018   Influenza,inj,Quad PF,6+ Mos 04/03/2014   Pneumococcal Conjugate-13 04/03/2014, 04/07/2017   Pneumococcal Polysaccharide-23 03/27/2009   Tdap 03/11/2011   Zoster, Live 06/02/2014    Past Medical History:  Diagnosis Date   Agatston CAC score, <100 05/04/2021   Coronary CTA : Coronary Calcium Score 27.  Minimal proximal (dominant) RCA calcified plaque (0-24%) with no plaque noted in the LAD and nondominant LCx.   Hyperlipemia    Hypertension     Tobacco History: Social History   Tobacco Use  Smoking Status Never  Smokeless Tobacco Never   Counseling given: Not Answered   Outpatient Medications Prior to Visit  Medication Sig Dispense Refill   acetaminophen (TYLENOL) 500 MG tablet Take 500 mg by mouth every 6 (six) hours as needed.     albuterol (VENTOLIN HFA) 108 (90 Base) MCG/ACT inhaler Inhale 2 puffs into the lungs every 6 (six) hours as needed for wheezing or shortness of breath. 1 each 2   Biotin 10 MG TABS Take 1 tablet by mouth daily.     cetirizine (ZYRTEC) 10 MG tablet Take 10 mg by mouth daily.     Cholecalciferol (VITAMIN D3) 50 MCG (2000 UT) TABS Take 1 tablet by mouth daily.     CRANBERRY PO Take 1 capsule by  mouth daily.     diclofenac sodium (VOLTAREN) 1 % GEL Apply 4 g topically 4 (four) times daily as needed.     famciclovir (FAMVIR) 250 MG tablet Take 1 tablet (250 mg total) by mouth daily. 90 tablet 3   fluticasone (FLONASE) 50 MCG/ACT nasal spray Place 2 sprays into both nostrils as needed.      Fluticasone Furoate (ARNUITY ELLIPTA) 100 MCG/ACT AEPB  Inhale 1 puff into the lungs daily. 90 each 3   hydrochlorothiazide (HYDRODIURIL) 25 MG tablet Take 12.5 mg by mouth daily.     losartan (COZAAR) 100 MG tablet Take 1 tablet (100 mg total) by mouth daily. 90 tablet 3   Multiple Minerals-Vitamins (CAL-MAG-ZINC-D PO) Take 1 tablet by mouth daily.     Respiratory Therapy Supplies (FLUTTER) DEVI 1 Device by Does not apply route as directed. 1 each 0   rosuvastatin (CRESTOR) 10 MG tablet Take 1 tablet (10 mg total) by mouth daily. 90 tablet 3   vitamin B-12 (CYANOCOBALAMIN) 1000 MCG tablet Take 1,000 mcg by mouth daily.     vitamin E 180 MG (400 UNITS) capsule Take 400 Units by mouth daily.     mupirocin nasal ointment (BACTROBAN) 2 % Use small amount in each nostril twice daily for five (5) days. After application, press sides of nose together and gently massage. (Patient not taking: Reported on 07/07/2021) 10 g 0   predniSONE (DELTASONE) 10 MG tablet Take 4 tablets by mouth with breakfast for 2 days, 2 tablets by mouth for 2 days and 1 tablet by mouth for 2 days. (Patient not taking: Reported on 07/07/2021) 14 tablet 0   No facility-administered medications prior to visit.     Review of Systems:   Constitutional: No weight loss or gain, night sweats, fevers, chills, fatigue, or lassitude. HEENT: No headaches, difficulty swallowing, tooth/dental problems, or sore throat. No sneezing, itching, ear ache, nasal congestion. +occasional post nasal drip, well-controlled with flonase. CV:  No chest pain, orthopnea, PND, swelling in lower extremities, anasarca, dizziness, palpitations, syncope Resp: +shortness of breath with exertion (increased); productive cough (clear to yellow; unchanged from baseline); occasional wheeze. No excess mucus or change in color of mucus. No hemoptysis.  No chest wall deformity GI:  No heartburn, indigestion, abdominal pain, nausea, vomiting, diarrhea, change in bowel habits, loss of appetite, bloody stools.  GU: No dysuria,  change in color of urine, urgency or frequency.  No flank pain, no hematuria  Skin: No rash, lesions, ulcerations MSK:  No joint pain or swelling.  No decreased range of motion.  No back pain. Neuro: No dizziness or lightheadedness.  Psych: No depression or anxiety. Mood stable.     Physical Exam:  BP 140/70 (BP Location: Left Arm, Patient Position: Sitting, Cuff Size: Normal)    Pulse 82    Temp 98.4 F (36.9 C) (Oral)    Ht 5' 6.5" (1.689 m)    Wt 173 lb (78.5 kg)    SpO2 94%    BMI 27.50 kg/m   GEN: Pleasant, interactive, well-nourished; in no acute distress. HEENT:  Normocephalic and atraumatic. EACs patent bilaterally. TM pearly gray with present light reflex bilaterally. PERRLA. Sclera white. Nasal turbinates pink, moist and patent bilaterally. No rhinorrhea present. Oropharynx pink and moist, without exudate or edema. No lesions, ulcerations, or postnasal drip. Scattered white and yellow plaques on tongue NECK:  Supple w/ fair ROM. No JVD present. Normal carotid impulses w/o bruits. Thyroid symmetrical with no goiter or nodules palpated. No  lymphadenopathy.   CV: RRR, no m/r/g, no peripheral edema. Pulses intact, +2 bilaterally. No cyanosis, pallor or clubbing. PULMONARY:  Unlabored, regular breathing. Clear bilaterally A&P w/o wheezes/rales/rhonchi. No accessory muscle use. No dullness to percussion. GI: BS present and normoactive. Soft, non-tender to palpation. No organomegaly or masses detected. No CVA tenderness. MSK: No erythema, warmth or tenderness. Cap refil <2 sec all extrem. No deformities or joint swelling noted.  Neuro: A/Ox3. No focal deficits noted.   Skin: Warm, no lesions or rashe Psych: Normal affect and behavior. Judgement and thought content appropriate.     Lab Results:  CBC    Component Value Date/Time   WBC 7.9 04/15/2021 0929   WBC 17.8 (H) 11/27/2016 0846   RBC 4.85 04/15/2021 0929   RBC 4.48 11/27/2016 0846   HGB 14.7 04/15/2021 0929   HCT 42.8  04/15/2021 0929   PLT 242 04/15/2021 0929   MCV 88 04/15/2021 0929   MCH 30.3 04/15/2021 0929   MCH 30.3 11/27/2016 0846   MCHC 34.3 04/15/2021 0929   MCHC 34.2 11/27/2016 0846   RDW 11.9 04/15/2021 0929   LYMPHSABS 2.6 04/03/2014 1705   MONOABS 0.6 04/03/2014 1705   EOSABS 0.3 04/03/2014 1705   BASOSABS 0.0 04/03/2014 1705    BMET    Component Value Date/Time   NA 141 04/15/2021 0929   K 4.2 04/15/2021 0929   CL 100 04/15/2021 0929   CO2 25 04/15/2021 0929   GLUCOSE 111 (H) 04/15/2021 0929   GLUCOSE 105 (H) 09/01/2020 1048   BUN 21 04/15/2021 0929   CREATININE 1.26 (H) 04/15/2021 0929   CALCIUM 9.4 04/15/2021 0929   GFRNONAA 54 (L) 11/27/2016 0846   GFRAA >60 11/27/2016 0846    BNP No results found for: BNP   Imaging:  CT CHEST HIGH RESOLUTION  Result Date: 07/25/2021 CLINICAL DATA:  81 year old female with history of shortness of breath on exertion. Evaluate for interstitial lung disease. EXAM: CT CHEST WITHOUT CONTRAST TECHNIQUE: Multidetector CT imaging of the chest was performed following the standard protocol without intravenous contrast. High resolution imaging of the lungs, as well as inspiratory and expiratory imaging, was performed. RADIATION DOSE REDUCTION: This exam was performed according to the departmental dose-optimization program which includes automated exposure control, adjustment of the mA and/or kV according to patient size and/or use of iterative reconstruction technique. COMPARISON:  Cardiac CT 05/03/2021.  Chest CT 10/10/2018. FINDINGS: Cardiovascular: Heart size is normal. There is no significant pericardial fluid, thickening or pericardial calcification. There is aortic atherosclerosis, as well as atherosclerosis of the great vessels of the mediastinum and the coronary arteries, including calcified atherosclerotic plaque in the left main and right coronary arteries. Mediastinum/Nodes: No pathologically enlarged mediastinal or hilar lymph nodes. Please  note that accurate exclusion of hilar adenopathy is limited on noncontrast CT scans. Esophagus is unremarkable in appearance. No axillary lymphadenopathy. Lungs/Pleura: High-resolution images again demonstrate some patchy areas of very mild basal predominant ground-glass attenuation, septal thickening and mild subpleural reticulation. No progression of disease compared to the prior study from 10/10/2018. No traction bronchiectasis or honeycombing. Inspiratory and expiratory imaging demonstrates very mild air trapping indicative of mild small airways disease. No acute consolidative airspace disease. No pleural effusions. No suspicious appearing pulmonary nodules or masses are noted. Upper Abdomen: Subcentimeter low-attenuation lesion in segment 2 of the liver, incompletely characterized on today's non-contrast CT examination, but similar to prior studies and statistically likely to represent a cyst. Aortic atherosclerosis. Musculoskeletal: There are no aggressive appearing lytic  or blastic lesions noted in the visualized portions of the skeleton. IMPRESSION: 1. Very subtle findings in the lungs isolated to the lung bases, stable compared to prior study from 2020, categorized as indeterminate for usual interstitial pneumonia (UIP) per current ATS guidelines. 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 2. Aortic atherosclerosis, in addition to left main and right coronary artery disease. Aortic Atherosclerosis (ICD10-I70.0) and Emphysema (ICD10-J43.9). Electronically Signed   By: Vinnie Langton M.D.   On: 07/25/2021 05:59      No flowsheet data found.  No results found for: NITRICOXIDE      Assessment & Plan:   Bronchiectasis without complication (HCC) Mucinex 600 mg Twice daily. Encouraged compliance with flutter valve therapy.  Cough relatively unchanged but could consider addition of hypertonic saline nebs. Repeat HRCT  ordered for evaluation for progression d/t increased DOE; however, I suspect this is r/t to her asthma.  Patient Instructions  -Stop Arnuity. Trial step up therapy to Advair 115 2 puffs Twice daily. Brush tongue and rinse mouth afterwards.  -Continue Albuterol inhaler 2 puffs every 6 hours as needed for shortness of breath or wheezing. Notify if symptoms persist despite rescue inhaler/neb use -Continue flonase 2 sprays each nostril daily -Continue flutter valve therapy -Continue zyrtec 10 mg daily   -Prednisone 40 mg daily for 5 days. Take in the AM with food. -Mucinex 600 mg Twice daily  -Nystatin oral rinse 5 mL four times daily for 10 days. Swish and swallow.   High resolution CT chest ordered. Someone will contact you for scheduling.   Follow up in one month with Dr. Mortimer Fries or Alanson Aly. If symptoms do not improve or worsen, please contact office for sooner follow up or seek emergency care.    ILD (interstitial lung disease) ([place]) See above plan.  Allergic rhinitis Well-controlled with flonase and zyrtec. Continue therapies.   DOE (dyspnea on exertion) Multifactorial related to asthma, BTX/ILD and deconditioning. No remarkable findings from cardiology standpoint. Step up trial therapy to ICS/LABA. HRCT.  Oral candidiasis Thrush present on exam. Educated on importance of oral hygiene post ICS use. Nystatin rinse 5 mL four times a day for 10 days.   Asthma Increased DOE over past 6 months. PFTs in April showed mild obstructive disease with positive bronchodilator response, consistent with asthma. Mild flare in wheezing recently. Prednisone 40 mg for 5 days.Trial step up therapy to Advair 115 2 puffs Twice daily. Cough relatively unchanged but could consider addition of hypertonic saline nebs. Repeat HRCT ordered for further eval of dyspnea.   Clayton Bibles, NP 07/26/2021  Pt aware and understands NP's role.

## 2021-07-07 NOTE — Patient Instructions (Addendum)
-  Stop Arnuity. Trial step up therapy to Advair 115 2 puffs Twice daily. Brush tongue and rinse mouth afterwards.  -Continue Albuterol inhaler 2 puffs every 6 hours as needed for shortness of breath or wheezing. Notify if symptoms persist despite rescue inhaler/neb use -Continue flonase 2 sprays each nostril daily -Continue flutter valve therapy -Continue zyrtec 10 mg daily   -Prednisone 40 mg daily for 5 days. Take in the AM with food. -Mucinex 600 mg Twice daily  -Nystatin oral rinse 5 mL four times daily for 10 days. Swish and swallow.   High resolution CT chest ordered. Someone will contact you for scheduling.   Follow up in one month with Patty Shelton or Patty Shelton. If symptoms do not improve or worsen, please contact office for sooner follow up or seek emergency care.

## 2021-07-08 ENCOUNTER — Encounter: Payer: Self-pay | Admitting: Cardiology

## 2021-07-08 ENCOUNTER — Ambulatory Visit (INDEPENDENT_AMBULATORY_CARE_PROVIDER_SITE_OTHER): Payer: Medicare Other | Admitting: Cardiology

## 2021-07-08 VITALS — BP 136/70 | HR 84 | Ht 66.0 in | Wt 173.0 lb

## 2021-07-08 DIAGNOSIS — I25118 Atherosclerotic heart disease of native coronary artery with other forms of angina pectoris: Secondary | ICD-10-CM

## 2021-07-08 DIAGNOSIS — E7800 Pure hypercholesterolemia, unspecified: Secondary | ICD-10-CM

## 2021-07-08 DIAGNOSIS — I10 Essential (primary) hypertension: Secondary | ICD-10-CM | POA: Diagnosis not present

## 2021-07-08 DIAGNOSIS — E785 Hyperlipidemia, unspecified: Secondary | ICD-10-CM | POA: Diagnosis not present

## 2021-07-08 DIAGNOSIS — I700 Atherosclerosis of aorta: Secondary | ICD-10-CM | POA: Diagnosis not present

## 2021-07-08 DIAGNOSIS — R0609 Other forms of dyspnea: Secondary | ICD-10-CM | POA: Diagnosis not present

## 2021-07-08 NOTE — Progress Notes (Signed)
Primary Care Provider: Jinny Sanders, MD Cardiologist: None Electrophysiologist: None  Clinic Note: Chief Complaint  Patient presents with   Follow-up    F/U after cardiac testing   ===================================  ASSESSMENT/PLAN   Problem List Items Addressed This Visit       Cardiology Problems   Hyperlipidemia with target LDL less than 100 (Chronic)    Should be due for labs recheck by PCP soon.  Her last lipids showed LDL of 104.  She is currently on modest dose Crestor.  Pretty much close to goal.  If LDL goes wrong way, would probably increase to 20 mg.      Thoracic aortic atherosclerosis (HCC)-seen on CT (Chronic)    With coronary and thoracic aortic atherosclerosis, lipid risk factor modification with lipid management and blood pressure control.  Is on statin and adequate blood pressure control .       Primary hypertension - Primary (Chronic)    Blood pressure still controlled. Continue combination of ARB and HCTZ.  Preserved EF of echo and no heart failure symptoms.      Coronary atherosclerosis (Chronic)    Minimal disease noted on Coronary CTA. Not to the extent that would warrant aspirin. Recommend lipid management with LDL target less than 100.         Other   DOE (dyspnea on exertion) (Chronic)    Still multifactorial but probably related to asthma and deconditioning as well as interstitial lung disease.  Being seen by pulmonary medicine now.  Echocardiogram was unremarkable with normal EF and normal diastolic function.  Also cardiac CTA did not show any significant CAD.  PA is normal in size arguing against pulmonary hypertension.  With a low Coronary Calcium Score, I would not consider microvascular ischemia as etiology.  Agree with pulmonary evaluation.       With essentially normal cardiac evaluation in an 81 year old.  Recommend follow-up with PCP and potentially pulmonary medicine.  Follow-up with cardiology  PRN.   ===================================  HPI:    Patty Shelton is a 81 y.o. female with a PMH notable for HTN, HLD, longstanding ILD/Bronchiolectasis who presents today for 25-month follow-up to discuss results of Echocardiogram and Coronary CTA.  Patty Shelton was last seen on April 15, 2021 for evaluation of DYSPNEA ON EXERTION (Coronary Calcification Seen on CT Scan) at the request of Bedsole, Amy E, MD. no reported controlled hypertension, and worsening exertional dyspnea.  Having to stop at least once to get to the mailbox (about 100 feet away).  No chest pain or pressure.  If she does more than usual gardening or chores in the house, also no dyspnea.  No PND or orthopnea.  Recent Hospitalizations: None  Reviewed  CV studies:    The following studies were reviewed today: (if available, images/films reviewed: From Epic Chart or Care Everywhere) TTE 05/27/2021: (Normal Study) EF 55 to 60%.  No R WMA.  GR 1 DD with mild LA dilation.  Normal RV size and function-normal estimated RVP and RAP.  Normal valves. Coronary CTA 05/04/2021: Coronary Calcium Score 27.  Minimal proximal (dominant) RCA calcified plaque (0-24%) with no plaque noted in the LAD and nondominant LCx.  Normal PA size.mild aortic root and ascending aortic calcification-evaluation for dissection. (No significant extracardiac findings),   Interval History:   Patty Shelton returns today doing well.  Recovered from minor illness over holidays.  Still has DOE -- seen by [facility] yesterday for evaluation. Walking from one end of  house to the other, or walking rapidly -- has to stop to catch breath.  No chest pain /tightness.     LE - spider veins - no real swelling. Leg cramps.   CV Review of Symptoms (Summary) Cardiovascular ROS: positive for - dyspnea on exertion and spider veins but no real edema negative for - chest pain, irregular heartbeat, orthopnea, palpitations, paroxysmal nocturnal dyspnea, rapid heart rate, shortness  of breath, or lightheaded, dizzy, syncope/near syncope, TIA / amauarosis fugax.   REVIEWED OF SYSTEMS    Decreased energy level-mild fatigue. Cough on occasion, dyspnea. Joint pain and dizziness-with standing.  Also poor balance.   I have reviewed and (if needed) personally updated the patient's problem list, medications, allergies, past medical and surgical history, social and family history.   PAST MEDICAL HISTORY   Past Medical History:  Diagnosis Date   Agatston CAC score, <100 05/04/2021   Coronary CTA : Coronary Calcium Score 27.  Minimal proximal (dominant) RCA calcified plaque (0-24%) with no plaque noted in the LAD and nondominant LCx.   Hyperlipidemia with target LDL less than 100 03/27/2009   Qualifier: Diagnosis of  By: Diona Browner MD, Amy     Hypertension     PAST SURGICAL HISTORY   Past Surgical History:  Procedure Laterality Date   CATARACT EXTRACTION     CT CTA CORONARY W/CA SCORE W/CM &/OR WO/CM  05/04/2021   Coronary Calcium Score 27.  Minimal proximal (dominant) RCA calcified plaque (0-24%) with no plaque noted in the LAD and nondominant LCx.  Normal PA size.mild aortic root and ascending aortic calcification-evaluation for dissection. (No significant extracardiac findings),   RETINAL DETACHMENT SURGERY     TRANSTHORACIC ECHOCARDIOGRAM  05/27/2021   [facility]: (Normal Study) EF 55 to 60%.  No R WMA.  GR 1 DD with mild LA dilation.  Normal RV size and function-normal RVP and RAP.  Normal valves.    Immunization History  Administered Date(s) Administered   Fluad Quad(high Dose 65+) 03/14/2019, 04/02/2020   Influenza Split 05/24/2011, 03/01/2013, 04/24/2015   Influenza Whole 03/27/2009   Influenza, High Dose Seasonal PF 02/29/2016, 04/07/2017, 05/24/2018   Influenza,inj,Quad PF,6+ Mos 04/03/2014   Pneumococcal Conjugate-13 04/03/2014, 04/07/2017   Pneumococcal Polysaccharide-23 03/27/2009   Tdap 03/11/2011   Zoster, Live 06/02/2014    MEDICATIONS/ALLERGIES    Current Meds  Medication Sig   acetaminophen (TYLENOL) 500 MG tablet Take 500 mg by mouth every 6 (six) hours as needed.   albuterol (VENTOLIN HFA) 108 (90 Base) MCG/ACT inhaler Inhale 2 puffs into the lungs every 6 (six) hours as needed for wheezing or shortness of breath.   Biotin 10 MG TABS Take 1 tablet by mouth daily.   cetirizine (ZYRTEC) 10 MG tablet Take 10 mg by mouth daily.   Cholecalciferol (VITAMIN D3) 50 MCG (2000 UT) TABS Take 1 tablet by mouth daily.   CRANBERRY PO Take 1 capsule by mouth daily.   diclofenac sodium (VOLTAREN) 1 % GEL Apply 4 g topically 4 (four) times daily as needed.   famciclovir (FAMVIR) 250 MG tablet Take 1 tablet (250 mg total) by mouth daily.   fluticasone (FLONASE) 50 MCG/ACT nasal spray Place 2 sprays into both nostrils as needed.    Fluticasone Furoate (ARNUITY ELLIPTA) 100 MCG/ACT AEPB Inhale 1 puff into the lungs daily.   fluticasone-salmeterol (ADVAIR HFA) 115-21 MCG/ACT inhaler Inhale 2 puffs into the lungs 2 (two) times daily as needed.   guaiFENesin (MUCINEX) 600 MG 12 hr tablet Take 600 mg by mouth  2 (two) times daily as needed.   hydrochlorothiazide (HYDRODIURIL) 25 MG tablet Take 12.5 mg by mouth daily.   losartan (COZAAR) 100 MG tablet Take 1 tablet (100 mg total) by mouth daily.   Multiple Minerals-Vitamins (CAL-MAG-ZINC-D PO) Take 1 tablet by mouth daily.   nystatin (MYCOSTATIN) 100000 UNIT/ML suspension Take 5 mLs (500,000 Units total) by mouth 4 (four) times daily for 10 days. Swish and swallow   predniSONE (DELTASONE) 20 MG tablet Take 2 tablets (40 mg total) by mouth daily with breakfast for 5 days.   Respiratory Therapy Supplies (FLUTTER) DEVI 1 Device by Does not apply route as directed.   rosuvastatin (CRESTOR) 10 MG tablet Take 1 tablet (10 mg total) by mouth daily.   vitamin B-12 (CYANOCOBALAMIN) 1000 MCG tablet Take 1,000 mcg by mouth daily.   vitamin E 180 MG (400 UNITS) capsule Take 400 Units by mouth daily.    Allergies   Allergen Reactions   Amlodipine Besylate Swelling   Lipitor [Atorvastatin] Other (See Comments)    Aches at 40mg  dose Leg cramps     SOCIAL HISTORY/FAMILY HISTORY   Reviewed in Epic:  Pertinent findings:  Social History   Tobacco Use   Smoking status: Never   Smokeless tobacco: Never  Vaping Use   Vaping Use: Never used  Substance Use Topics   Alcohol use: No    Alcohol/week: 0.0 standard drinks   Drug use: No   Social History   Social History Narrative   No living will , no HCPOA. Full code (reviewed 2015, given packet)    OBJCTIVE -PE, EKG, labs   Wt Readings from Last 3 Encounters:  08/12/21 178 lb (80.7 kg)  08/11/21 178 lb 9.6 oz (81 kg)  07/08/21 173 lb (78.5 kg)    Physical Exam: BP 136/70 (BP Location: Left Arm, Patient Position: Sitting, Cuff Size: Large)    Pulse 84    Ht 5\' 6"  (1.676 m)    Wt 173 lb (78.5 kg)    SpO2 96%    BMI 27.92 kg/m  Physical Exam Vitals reviewed.  Constitutional:      General: She is not in acute distress.    Appearance: Normal appearance. She is normal weight. She is not ill-appearing (Healthy-appearing.).  HENT:     Head: Normocephalic and atraumatic.     Ears:     Comments: Very hard of hearing Cardiovascular:     Rate and Rhythm: Normal rate and regular rhythm.     Pulses: Normal pulses.     Heart sounds: Normal heart sounds. No murmur heard.   No friction rub. No gallop.  Pulmonary:     Effort: Pulmonary effort is normal.     Breath sounds: Normal breath sounds. No rhonchi.  Musculoskeletal:        General: No swelling.  Skin:    General: Skin is warm and dry.  Neurological:     General: No focal deficit present.     Mental Status: She is alert and oriented to person, place, and time.  Psychiatric:        Mood and Affect: Mood normal.        Behavior: Behavior normal.        Thought Content: Thought content normal.        Judgment: Judgment normal.    Adult ECG Report Not checked  Recent Labs:  Reviewed. Lab Results  Component Value Date   CHOL 177 08/12/2020   HDL 54.80 08/12/2020   LDLCALC 104 (  H) 08/12/2020   LDLDIRECT 180.6 04/04/2011   TRIG 91.0 08/12/2020   CHOLHDL 3 08/12/2020   Lab Results  Component Value Date   CREATININE 1.26 (H) 04/15/2021   BUN 21 04/15/2021   NA 141 04/15/2021   K 4.2 04/15/2021   CL 100 04/15/2021   CO2 25 04/15/2021   CBC Latest Ref Rng & Units 04/15/2021 11/27/2016 04/03/2014  WBC 3.4 - 10.8 x10E3/uL 7.9 17.8(H) 8.4  Hemoglobin 11.1 - 15.9 g/dL 14.7 13.6 12.9  Hematocrit 34.0 - 46.6 % 42.8 39.7 39.0  Platelets 150 - 450 x10E3/uL 242 300 254.0    No results found for: HGBA1C   ==================================================  COVID-19 Education: The signs and symptoms of COVID-19 were discussed with the patient and how to seek care for testing (follow up with PCP or arrange E-visit).    I spent a total of 15 minutes with the patient spent in direct patient consultation.  Additional time spent with chart review  / charting (studies, outside notes, etc): 20 min Total Time: 35 min  Current medicines are reviewed at length with the patient today.  (+/- concerns) none  This visit occurred during the SARS-CoV-2 public health emergency.  Safety protocols were in place, including screening questions prior to the visit, additional usage of staff PPE, and extensive cleaning of exam room while observing appropriate contact time as indicated for disinfecting solutions.  Notice: This dictation was prepared with Dragon dictation along with smart phrase technology. Any transcriptional errors that result from this process are unintentional and may not be corrected upon review.  Studies Ordered:  No orders of the defined types were placed in this encounter.   Patient Instructions / Medication Changes & Studies & Tests Ordered   Patient Instructions  Medication Instructions:   Your physician recommends that you continue on your current  medications as directed. Please refer to the Current Medication list given to you today.   *If you need a refill on your cardiac medications before your next appointment, please call your pharmacy*   Lab Work: None ordered  If you have labs (blood work) drawn today and your tests are completely normal, you will receive your results only by: [place] (if you have MyChart) OR A paper copy in the mail If you have any lab test that is abnormal or we need to change your treatment, we will call you to review the results.   Testing/Procedures: None ordered   Follow-Up: At [facility], you and your health needs are our priority.  As part of our continuing mission to provide you with exceptional heart care, we have created designated Provider Care Teams.  These Care Teams include your primary Cardiologist (physician) and Advanced Practice Providers (APPs -  Physician Assistants and Nurse Practitioners) who all work together to provide you with the care you need, when you need it.  We recommend signing up for the patient portal called "MyChart".  Sign up information is provided on this After Visit Summary.  MyChart is used to connect with patients for Virtual Visits (Telemedicine).  Patients are able to view lab/test results, encounter notes, upcoming appointments, etc.  Non-urgent messages can be sent to your provider as well.   To learn more about what you can do with MyChart, go to [email or web address].    Your next appointment:   As needed  The format for your next appointment:   In Person  Provider:   You may see Glenetta Hew, MD or one of the following  Advanced Practice Providers on your designated Care Team:   Murray Hodgkins, NP Christell Faith, PA-C Cadence Kathlen Mody, PA-C1}    Other Instructions N/A    Glenetta Hew, M.D., M.S. Interventional Cardiologist   Pager # [phone] Phone # [phone] [address] [place], [place] [address]   Thank you  for choosing [facility] [place]!!

## 2021-07-08 NOTE — Patient Instructions (Signed)
Medication Instructions:   Your physician recommends that you continue on your current medications as directed. Please refer to the Current Medication list given to you today.   *If you need a refill on your cardiac medications before your next appointment, please call your pharmacy*   Lab Work: None ordered  If you have labs (blood work) drawn today and your tests are completely normal, you will receive your results only by: [place] (if you have MyChart) OR A paper copy in the mail If you have any lab test that is abnormal or we need to change your treatment, we will call you to review the results.   Testing/Procedures: None ordered   Follow-Up: At [facility], you and your health needs are our priority.  As part of our continuing mission to provide you with exceptional heart care, we have created designated Provider Care Teams.  These Care Teams include your primary Cardiologist (physician) and Advanced Practice Providers (APPs -  Physician Assistants and Nurse Practitioners) who all work together to provide you with the care you need, when you need it.  We recommend signing up for the patient portal called "MyChart".  Sign up information is provided on this After Visit Summary.  MyChart is used to connect with patients for Virtual Visits (Telemedicine).  Patients are able to view lab/test results, encounter notes, upcoming appointments, etc.  Non-urgent messages can be sent to your provider as well.   To learn more about what you can do with MyChart, go to [email or web address].    Your next appointment:   As needed  The format for your next appointment:   In Person  Provider:   You may see Glenetta Hew, MD or one of the following Advanced Practice Providers on your designated Care Team:   Murray Hodgkins, NP Christell Faith, PA-C Cadence Kathlen Mody, PA-C1}    Other Instructions N/A

## 2021-07-23 ENCOUNTER — Ambulatory Visit
Admission: RE | Admit: 2021-07-23 | Discharge: 2021-07-23 | Disposition: A | Discharge status: Home / Self Care | Payer: Medicare Other | Source: Ambulatory Visit | Attending: Nurse Practitioner | Admitting: Nurse Practitioner

## 2021-07-23 ENCOUNTER — Other Ambulatory Visit: Payer: Self-pay

## 2021-07-23 DIAGNOSIS — I700 Atherosclerosis of aorta: Secondary | ICD-10-CM | POA: Diagnosis not present

## 2021-07-23 DIAGNOSIS — J849 Interstitial pulmonary disease, unspecified: Secondary | ICD-10-CM | POA: Insufficient documentation

## 2021-07-23 DIAGNOSIS — J439 Emphysema, unspecified: Secondary | ICD-10-CM | POA: Diagnosis not present

## 2021-07-23 DIAGNOSIS — J479 Bronchiectasis, uncomplicated: Secondary | ICD-10-CM | POA: Insufficient documentation

## 2021-07-23 DIAGNOSIS — I2510 Atherosclerotic heart disease of native coronary artery without angina pectoris: Secondary | ICD-10-CM | POA: Diagnosis not present

## 2021-07-23 DIAGNOSIS — R918 Other nonspecific abnormal finding of lung field: Secondary | ICD-10-CM | POA: Diagnosis not present

## 2021-07-26 NOTE — Progress Notes (Signed)
Please notify pt that her HRCT showed some patchy areas of very mild basal predominant ground-glass attenuation, septal thickening and mild subpleural reticulation, consistent with her ILD diagnosis. There was also very mild air trapping, consistent with asthma. Her imaging was overall stable and did not show any worsening of these, which is good news. If her breathing has improved, have her make an appt with Dr. Mortimer Fries in 2-3 months for follow up. Thanks!

## 2021-08-10 ENCOUNTER — Other Ambulatory Visit: Payer: Self-pay

## 2021-08-10 MED ORDER — HYDROCHLOROTHIAZIDE 25 MG PO TABS: 12.5000 mg | ORAL_TABLET | Freq: Every day | ORAL | 3 refills | Status: DC

## 2021-08-10 NOTE — Telephone Encounter (Signed)
Receiving refill request for HCTZ from [facility]. This medication is listed as historical. Patient sees cardiologist also. Is this ok to refill?

## 2021-08-11 ENCOUNTER — Other Ambulatory Visit: Payer: Self-pay

## 2021-08-11 ENCOUNTER — Ambulatory Visit (INDEPENDENT_AMBULATORY_CARE_PROVIDER_SITE_OTHER): Payer: Medicare Other | Admitting: Nurse Practitioner

## 2021-08-11 ENCOUNTER — Encounter: Payer: Self-pay | Admitting: Nurse Practitioner

## 2021-08-11 VITALS — BP 150/60 | HR 75 | Ht 66.0 in | Wt 178.6 lb

## 2021-08-11 DIAGNOSIS — J849 Interstitial pulmonary disease, unspecified: Secondary | ICD-10-CM | POA: Diagnosis not present

## 2021-08-11 DIAGNOSIS — I10 Essential (primary) hypertension: Secondary | ICD-10-CM

## 2021-08-11 DIAGNOSIS — J4531 Mild persistent asthma with (acute) exacerbation: Secondary | ICD-10-CM

## 2021-08-11 DIAGNOSIS — J309 Allergic rhinitis, unspecified: Secondary | ICD-10-CM | POA: Diagnosis not present

## 2021-08-11 MED ORDER — FLUTICASONE-SALMETEROL 115-21 MCG/ACT IN AERO: 2.0000 | INHALATION_SPRAY | Freq: Two times a day (BID) | RESPIRATORY_TRACT | 5 refills | Status: DC | PRN

## 2021-08-11 NOTE — Assessment & Plan Note (Signed)
Resolution of acute exacerbation.  Progressive shortness of breath has improved with step up in therapy to Advair.  We will continue current regimen of ICS/LABA and as needed albuterol. ? ?Patient Instructions  ?-Continue Advair 115 2 puffs Twice daily. Brush tongue and rinse mouth afterwards.  ?-Continue Albuterol inhaler 2 puffs every 6 hours as needed for shortness of breath or wheezing. Notify if symptoms persist despite rescue inhaler/neb use ?-Continue flonase 2 sprays each nostril daily ?-Continue flutter valve therapy 2-3 times a day  ?-Continue zyrtec 10 mg daily  ?-Continue Mucinex 600 mg Twice daily  ? ?Asthma Action Plan in place ?Rinse mouth after inhaled corticosteroid use.  ?Avoid triggers, when able.  ?Exercise encouraged. Notify if worsening symptoms upon exertion.  ?Notify and seek help if symptoms unrelieved by rescue inhaler. ? ?Follow up in three months with Dr. Mortimer Fries. If symptoms do not improve or worsen, please contact office for sooner follow up or seek emergency care ? ? ?

## 2021-08-11 NOTE — Assessment & Plan Note (Signed)
Well-controlled on current regimen of Zyrtec and Flonase. ?

## 2021-08-11 NOTE — Assessment & Plan Note (Signed)
No progression of disease noted on HRCT or PFTs.  No bronchiectasis was noted on most recent HRCT.  Findings discussed with patient who verbalized understanding.  Suspect that her shortness of breath was primarily related to poorly controlled asthma. ?

## 2021-08-11 NOTE — Patient Instructions (Signed)
-  Continue Advair 115 2 puffs Twice daily. Brush tongue and rinse mouth afterwards.  ?-Continue Albuterol inhaler 2 puffs every 6 hours as needed for shortness of breath or wheezing. Notify if symptoms persist despite rescue inhaler/neb use ?-Continue flonase 2 sprays each nostril daily ?-Continue flutter valve therapy 2-3 times a day  ?-Continue zyrtec 10 mg daily  ?-Continue Mucinex 600 mg Twice daily  ? ?Asthma Action Plan in place ?Rinse mouth after inhaled corticosteroid use.  ?Avoid triggers, when able.  ?Exercise encouraged. Notify if worsening symptoms upon exertion.  ?Notify and seek help if symptoms unrelieved by rescue inhaler. ? ?Follow up in three months with Dr. Mortimer Fries. If symptoms do not improve or worsen, please contact office for sooner follow up or seek emergency care ?

## 2021-08-11 NOTE — Progress Notes (Signed)
@Patient  ID: Patty Shelton, female    DOB: [birth year], 81 y.o.   MRN: [id number]  Chief Complaint  Patient presents with   Follow-up    HRCT results    Referring provider: Jinny Sanders, MD  HPI: 81 year old female, never smoker followed for bronchiectasis/ILD and asthma.  She is a patient of Dr. Zoila Shutter and last seen in office on 07/07/2021 by [facility] NP.  Past medical history significant for hypertension, allergic rhinitis, dysphagia, GERD, gout, CKD stage III, HLD.  TEST/EVENTS:  10/10/2018 HRCT chest: Atherosclerosis.  Hypodense 1.1 cm right thyroid nodule.  Mild patchy groundglass opacity and minimal reticulation throughout both lungs, predominantly in peripheral lower lobes.  Associated mild traction BTX and minimal architectural distortion.  No frank honeycombing.  No significant progression.  Small hiatal hernia.  Subcentimeter hypodense anterior left liver lobe lesion, considered benign 09/28/2020 PFTs: FVC 2.36 (86), FEV1 1.17 (83), ratio 71, TLC 103.  Positive BD 05/27/2021 echocardiogram: EF 55 to 60%, G1 DD with LA mild dilation. 07/23/2021 HRCT chest: Atherosclerosis.  Some patchy areas of very mild basal predominant groundglass attenuation, septal thickening and mild subpleural reticulation.  No traction bronchiectasis or honeycombing.  No progression of disease.  Very mild air trapping indicative of small airways disease.  Subcentimeter low-attenuation liver lesion again noted.  07/07/2021: OV with Arda Keadle NP.  Progressive shortness of breath over the past 6 months.  Also noted a daily productive cough which was relatively unchanged from baseline and occasional wheeze.  She was worked up by cardiology without any remarkable findings.  Recent PFTs did not show significant worsening and restrictive lung disease.  HRCT was ordered for further evaluation but suspected that asthma was leading cause of worsening shortness of breath.She was stepped up from Arnuity to Advair 2 puffs twice a day.   Started on short prednisone burst and Mucinex.  Treated for thrush with nystatin oral rinses.    08/11/2021: Today-follow-up Patient presents today to discuss HRCT results and for follow-up.  Her HRCT did not show any progression of her ILD and no evidence of bronchiectasis.  She had some very mild air trapping which is consistent with her asthma.  She reported that she has been feeling much better after being changed to Advair and has noticed that she is able to tolerate activities better.  Her cough has also improved and is not as productive or frequent.  She has not required her rescue inhaler since being seen last.  She completed her nystatin and has had no recurrence of thrush.  She continues on Advair twice daily.,  Zyrtec and Flonase.  Overall she feels better and offers no further complaints  Allergies  Allergen Reactions   Amlodipine Besylate Swelling   Lipitor [Atorvastatin] Other (See Comments)    Aches at 40mg  dose Leg cramps     Immunization History  Administered Date(s) Administered   Fluad Quad(high Dose 65+) 03/14/2019, 04/02/2020   Influenza Split 05/24/2011, 03/01/2013, 04/24/2015   Influenza Whole 03/27/2009   Influenza, High Dose Seasonal PF 02/29/2016, 04/07/2017, 05/24/2018   Influenza,inj,Quad PF,6+ Mos 04/03/2014   Pneumococcal Conjugate-13 04/03/2014, 04/07/2017   Pneumococcal Polysaccharide-23 03/27/2009   Tdap 03/11/2011   Zoster, Live 06/02/2014    Past Medical History:  Diagnosis Date   Agatston CAC score, <100 05/04/2021   Coronary CTA : Coronary Calcium Score 27.  Minimal proximal (dominant) RCA calcified plaque (0-24%) with no plaque noted in the LAD and nondominant LCx.   Hyperlipemia    Hypertension  Tobacco History: Social History   Tobacco Use  Smoking Status Never  Smokeless Tobacco Never   Counseling given: Not Answered   Outpatient Medications Prior to Visit  Medication Sig Dispense Refill   acetaminophen (TYLENOL) 500 MG tablet  Take 500 mg by mouth every 6 (six) hours as needed.     albuterol (VENTOLIN HFA) 108 (90 Base) MCG/ACT inhaler Inhale 2 puffs into the lungs every 6 (six) hours as needed for wheezing or shortness of breath. 1 each 2   Biotin 10 MG TABS Take 1 tablet by mouth daily.     cetirizine (ZYRTEC) 10 MG tablet Take 10 mg by mouth daily.     Cholecalciferol (VITAMIN D3) 50 MCG (2000 UT) TABS Take 1 tablet by mouth daily.     CRANBERRY PO Take 1 capsule by mouth daily.     diclofenac sodium (VOLTAREN) 1 % GEL Apply 4 g topically 4 (four) times daily as needed.     famciclovir (FAMVIR) 250 MG tablet Take 1 tablet (250 mg total) by mouth daily. 90 tablet 3   fluticasone (FLONASE) 50 MCG/ACT nasal spray Place 2 sprays into both nostrils as needed.      guaiFENesin (MUCINEX) 600 MG 12 hr tablet Take 600 mg by mouth 2 (two) times daily as needed.     hydrochlorothiazide (HYDRODIURIL) 25 MG tablet Take 0.5 tablets (12.5 mg total) by mouth daily. 90 tablet 3   losartan (COZAAR) 100 MG tablet Take 1 tablet (100 mg total) by mouth daily. 90 tablet 3   Multiple Minerals-Vitamins (CAL-MAG-ZINC-D PO) Take 1 tablet by mouth daily.     Respiratory Therapy Supplies (FLUTTER) DEVI 1 Device by Does not apply route as directed. 1 each 0   rosuvastatin (CRESTOR) 10 MG tablet Take 1 tablet (10 mg total) by mouth daily. 90 tablet 3   vitamin B-12 (CYANOCOBALAMIN) 1000 MCG tablet Take 1,000 mcg by mouth daily.     vitamin E 180 MG (400 UNITS) capsule Take 400 Units by mouth daily.     fluticasone-salmeterol (ADVAIR HFA) 115-21 MCG/ACT inhaler Inhale 2 puffs into the lungs 2 (two) times daily as needed.     Fluticasone Furoate (ARNUITY ELLIPTA) 100 MCG/ACT AEPB Inhale 1 puff into the lungs daily. (Patient not taking: Reported on 08/11/2021) 90 each 3   No facility-administered medications prior to visit.     Review of Systems:   Constitutional: No weight loss or gain, night sweats, fevers, chills, fatigue, or  lassitude. HEENT: No headaches, difficulty swallowing, tooth/dental problems, or sore throat. No sneezing, itching, ear ache, nasal congestion, or post nasal drip CV:  No chest pain, orthopnea, PND, swelling in lower extremities, anasarca, dizziness, palpitations, syncope Resp: No shortness of breath with exertion or at rest. No excess mucus or change in color of mucus. No productive or non-productive. No hemoptysis. No wheezing.  No chest wall deformity GI:  No heartburn, indigestion, abdominal pain, nausea, vomiting, diarrhea, change in bowel habits, loss of appetite, bloody stools.  GU: No dysuria, change in color of urine, urgency or frequency.  No flank pain, no hematuria  Skin: No rash, lesions, ulcerations MSK:  No joint pain or swelling.  No decreased range of motion.  No back pain. Neuro: No dizziness or lightheadedness.  Psych: No depression or anxiety. Mood stable.     Physical Exam:  BP (!) 150/60 (BP Location: Left Arm, Cuff Size: Normal) Comment: currently has f/u w/pcp 08/12/21 to change bp meds   Pulse 75  Ht 5\' 6"  (1.676 m)    Wt 178 lb 9.6 oz (81 kg)    SpO2 94%    BMI 28.83 kg/m   GEN: Pleasant, interactive, well-nourished; in no acute distress. HEENT:  Normocephalic and atraumatic. EACs patent bilaterally. TM pearly gray with present light reflex bilaterally. PERRLA. Sclera white. Nasal turbinates pink, moist and patent bilaterally. No rhinorrhea present. Oropharynx pink and moist, without exudate or edema. No lesions, ulcerations, or postnasal drip.  NECK:  Supple w/ fair ROM. No JVD present. Normal carotid impulses w/o bruits. Thyroid symmetrical with no goiter or nodules palpated. No lymphadenopathy.   CV: RRR, no m/r/g, no peripheral edema. Pulses intact, +2 bilaterally. No cyanosis, pallor or clubbing. PULMONARY:  Unlabored, regular breathing. Clear bilaterally A&P w/o wheezes/rales/rhonchi. No accessory muscle use. No dullness to percussion. GI: BS present and  normoactive. Soft, non-tender to palpation. No organomegaly or masses detected. No CVA tenderness. MSK: No erythema, warmth or tenderness. Cap refil <2 sec all extrem. No deformities or joint swelling noted.  Neuro: A/Ox3. No focal deficits noted.   Skin: Warm, no lesions or rashe Psych: Normal affect and behavior. Judgement and thought content appropriate.     Lab Results:  CBC    Component Value Date/Time   WBC 7.9 04/15/2021 0929   WBC 17.8 (H) 11/27/2016 0846   RBC 4.85 04/15/2021 0929   RBC 4.48 11/27/2016 0846   HGB 14.7 04/15/2021 0929   HCT 42.8 04/15/2021 0929   PLT 242 04/15/2021 0929   MCV 88 04/15/2021 0929   MCH 30.3 04/15/2021 0929   MCH 30.3 11/27/2016 0846   MCHC 34.3 04/15/2021 0929   MCHC 34.2 11/27/2016 0846   RDW 11.9 04/15/2021 0929   LYMPHSABS 2.6 04/03/2014 1705   MONOABS 0.6 04/03/2014 1705   EOSABS 0.3 04/03/2014 1705   BASOSABS 0.0 04/03/2014 1705    BMET    Component Value Date/Time   NA 141 04/15/2021 0929   K 4.2 04/15/2021 0929   CL 100 04/15/2021 0929   CO2 25 04/15/2021 0929   GLUCOSE 111 (H) 04/15/2021 0929   GLUCOSE 105 (H) 09/01/2020 1048   BUN 21 04/15/2021 0929   CREATININE 1.26 (H) 04/15/2021 0929   CALCIUM 9.4 04/15/2021 0929   GFRNONAA 54 (L) 11/27/2016 0846   GFRAA >60 11/27/2016 0846    BNP No results found for: BNP   Imaging:  CT CHEST HIGH RESOLUTION  Result Date: 07/25/2021 CLINICAL DATA:  80 year old female with history of shortness of breath on exertion. Evaluate for interstitial lung disease. EXAM: CT CHEST WITHOUT CONTRAST TECHNIQUE: Multidetector CT imaging of the chest was performed following the standard protocol without intravenous contrast. High resolution imaging of the lungs, as well as inspiratory and expiratory imaging, was performed. RADIATION DOSE REDUCTION: This exam was performed according to the departmental dose-optimization program which includes automated exposure control, adjustment of the mA  and/or kV according to patient size and/or use of iterative reconstruction technique. COMPARISON:  Cardiac CT 05/03/2021.  Chest CT 10/10/2018. FINDINGS: Cardiovascular: Heart size is normal. There is no significant pericardial fluid, thickening or pericardial calcification. There is aortic atherosclerosis, as well as atherosclerosis of the great vessels of the mediastinum and the coronary arteries, including calcified atherosclerotic plaque in the left main and right coronary arteries. Mediastinum/Nodes: No pathologically enlarged mediastinal or hilar lymph nodes. Please note that accurate exclusion of hilar adenopathy is limited on noncontrast CT scans. Esophagus is unremarkable in appearance. No axillary lymphadenopathy. Lungs/Pleura: High-resolution images again demonstrate  some patchy areas of very mild basal predominant ground-glass attenuation, septal thickening and mild subpleural reticulation. No progression of disease compared to the prior study from 10/10/2018. No traction bronchiectasis or honeycombing. Inspiratory and expiratory imaging demonstrates very mild air trapping indicative of mild small airways disease. No acute consolidative airspace disease. No pleural effusions. No suspicious appearing pulmonary nodules or masses are noted. Upper Abdomen: Subcentimeter low-attenuation lesion in segment 2 of the liver, incompletely characterized on today's non-contrast CT examination, but similar to prior studies and statistically likely to represent a cyst. Aortic atherosclerosis. Musculoskeletal: There are no aggressive appearing lytic or blastic lesions noted in the visualized portions of the skeleton. IMPRESSION: 1. Very subtle findings in the lungs isolated to the lung bases, stable compared to prior study from 2020, categorized as indeterminate for usual interstitial pneumonia (UIP) per current ATS guidelines. 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 2. Aortic atherosclerosis, in addition to left main and right coronary artery disease. Aortic Atherosclerosis (ICD10-I70.0) and Emphysema (ICD10-J43.9). Electronically Signed   By: Vinnie Langton M.D.   On: 07/25/2021 05:59      No flowsheet data found.  No results found for: NITRICOXIDE      Assessment & Plan:   Asthma Resolution of acute exacerbation.  Progressive shortness of breath has improved with step up in therapy to Advair.  We will continue current regimen of ICS/LABA and as needed albuterol.  Patient Instructions  -Continue Advair 115 2 puffs Twice daily. Brush tongue and rinse mouth afterwards.  -Continue Albuterol inhaler 2 puffs every 6 hours as needed for shortness of breath or wheezing. Notify if symptoms persist despite rescue inhaler/neb use -Continue flonase 2 sprays each nostril daily -Continue flutter valve therapy 2-3 times a day  -Continue zyrtec 10 mg daily  -Continue Mucinex 600 mg Twice daily   Asthma Action Plan in place Rinse mouth after inhaled corticosteroid use.  Avoid triggers, when able.  Exercise encouraged. Notify if worsening symptoms upon exertion.  Notify and seek help if symptoms unrelieved by rescue inhaler.  Follow up in three months with Dr. Mortimer Fries. If symptoms do not improve or worsen, please contact office for sooner follow up or seek emergency care    ILD (interstitial lung disease) ([place]) No progression of disease noted on HRCT or PFTs.  No bronchiectasis was noted on most recent HRCT.  Findings discussed with patient who verbalized understanding.  Suspect that her shortness of breath was primarily related to poorly controlled asthma.  Allergic rhinitis Well-controlled on current regimen of Zyrtec and Flonase.  Primary hypertension BP elevated at appointment today.  Patient currently working with PCP for better control.   I spent 28 minutes of dedicated to the care of this patient on  the date of this encounter to include pre-visit review of records, face-to-face time with the patient discussing conditions above, post visit ordering of testing, clinical documentation with the electronic health record, making appropriate referrals as documented, and communicating necessary findings to members of the patients care team.  Clayton Bibles, NP 08/11/2021  Pt aware and understands NP's role.

## 2021-08-11 NOTE — Progress Notes (Signed)
Subjective:   Patty Shelton is a 81 y.o. female who presents for Medicare Annual (Subsequent) preventive examination.  I connected with Vilinda Blanks today by telephone and verified that I am speaking with the correct person using two identifiers. Location patient: home Location provider: work Persons participating in the virtual visit: patient, Marine scientist.    I discussed the limitations, risks, security and privacy concerns of performing an evaluation and management service by telephone and the availability of in person appointments. I also discussed with the patient that there may be a patient responsible charge related to this service. The patient expressed understanding and verbally consented to this telephonic visit.    Interactive audio and video telecommunications were attempted between this provider and patient, however failed, due to patient having technical difficulties OR patient did not have access to video capability.  We continued and completed visit with audio only.  Some vital signs may be absent or patient reported.   Time Spent with patient on telephone encounter: 20 minutes  Review of Systems     Cardiac Risk Factors include: advanced age (>57men, >38 women);hypertension     Objective:    Today's Vitals   08/12/21 1318  Weight: 178 lb (80.7 kg)  Height: 5\' 6"  (1.676 m)   Body mass index is 28.73 kg/m.  Advanced Directives 08/12/2021 08/11/2020 08/02/2019 07/26/2018 07/18/2017 11/27/2016 05/19/2016  Does Patient Have a Medical Advance Directive? No No No No No No No  Does patient want to make changes to medical advance directive? Yes (MAU/Ambulatory/Procedural Areas - Information given) - - - - - -  Would patient like information on creating a medical advance directive? - No - Patient declined No - Patient declined No - Patient declined No - Patient declined Yes (ED - Information included in AVS) -    Current Medications (verified) Outpatient Encounter Medications as of  08/12/2021  Medication Sig   acetaminophen (TYLENOL) 500 MG tablet Take 500 mg by mouth every 6 (six) hours as needed.   albuterol (VENTOLIN HFA) 108 (90 Base) MCG/ACT inhaler Inhale 2 puffs into the lungs every 6 (six) hours as needed for wheezing or shortness of breath.   Biotin 10 MG TABS Take 1 tablet by mouth daily.   cetirizine (ZYRTEC) 10 MG tablet Take 10 mg by mouth daily.   Cholecalciferol (VITAMIN D3) 50 MCG (2000 UT) TABS Take 1 tablet by mouth daily.   CRANBERRY PO Take 1 capsule by mouth daily.   diclofenac sodium (VOLTAREN) 1 % GEL Apply 4 g topically 4 (four) times daily as needed.   famciclovir (FAMVIR) 250 MG tablet Take 1 tablet (250 mg total) by mouth daily.   fluticasone (FLONASE) 50 MCG/ACT nasal spray Place 2 sprays into both nostrils as needed.    fluticasone-salmeterol (ADVAIR HFA) 115-21 MCG/ACT inhaler Inhale 2 puffs into the lungs 2 (two) times daily as needed.   guaiFENesin (MUCINEX) 600 MG 12 hr tablet Take 600 mg by mouth 2 (two) times daily as needed.   hydrochlorothiazide (HYDRODIURIL) 25 MG tablet Take 0.5 tablets (12.5 mg total) by mouth daily.   losartan (COZAAR) 100 MG tablet Take 1 tablet (100 mg total) by mouth daily.   Multiple Minerals-Vitamins (CAL-MAG-ZINC-D PO) Take 1 tablet by mouth daily.   Respiratory Therapy Supplies (FLUTTER) DEVI 1 Device by Does not apply route as directed.   rosuvastatin (CRESTOR) 10 MG tablet Take 1 tablet (10 mg total) by mouth daily.   vitamin B-12 (CYANOCOBALAMIN) 1000 MCG tablet Take 1,000  mcg by mouth daily.   vitamin E 180 MG (400 UNITS) capsule Take 400 Units by mouth daily.   [DISCONTINUED] Fluticasone Furoate (ARNUITY ELLIPTA) 100 MCG/ACT AEPB Inhale 1 puff into the lungs daily. (Patient not taking: Reported on 08/11/2021)   [DISCONTINUED] fluticasone-salmeterol (ADVAIR HFA) 115-21 MCG/ACT inhaler Inhale 2 puffs into the lungs 2 (two) times daily as needed.   No facility-administered encounter medications on file as of  08/12/2021.    Allergies (verified) Amlodipine besylate and Lipitor [atorvastatin]   History: Past Medical History:  Diagnosis Date   Agatston CAC score, <100 05/04/2021   Coronary CTA : Coronary Calcium Score 27.  Minimal proximal (dominant) RCA calcified plaque (0-24%) with no plaque noted in the LAD and nondominant LCx.   Hyperlipemia    Hypertension    Past Surgical History:  Procedure Laterality Date   CATARACT EXTRACTION     CT CTA CORONARY W/CA SCORE W/CM &/OR WO/CM  05/04/2021   Coronary Calcium Score 27.  Minimal proximal (dominant) RCA calcified plaque (0-24%) with no plaque noted in the LAD and nondominant LCx.  Normal PA size.mild aortic root and ascending aortic calcification-evaluation for dissection. (No significant extracardiac findings),   RETINAL DETACHMENT SURGERY     TRANSTHORACIC ECHOCARDIOGRAM  05/27/2021   [facility]: (Normal Study) EF 55 to 60%.  No R WMA.  GR 1 DD with mild LA dilation.  Normal RV size and function-normal RVP and RAP.  Normal valves.   Family History  Problem Relation Age of Onset   Heart attack Mother 31       Apparently she started having issues at 59, but not sure if she had a heart attack at that time   Coronary artery disease Mother 20   Stroke Father    Hyperlipidemia Father    Dementia Father    Social History   Socioeconomic History   Marital status: Married    Spouse name: Not on file   Number of children: Not on file   Years of education: Not on file   Highest education level: Not on file  Occupational History   Not on file  Tobacco Use   Smoking status: Never   Smokeless tobacco: Never  Vaping Use   Vaping Use: Never used  Substance and Sexual Activity   Alcohol use: No    Alcohol/week: 0.0 standard drinks   Drug use: No   Sexual activity: Yes  Other Topics Concern   Not on file  Social History Narrative   No living will , no HCPOA. Full code (reviewed 2015, given packet)   Social Determinants of Health    Financial Resource Strain: Low Risk    Difficulty of Paying Living Expenses: Not hard at all  Food Insecurity: No Food Insecurity   Worried About Charity fundraiser in the Last Year: Never true   [place] in the Last Year: Never true  Transportation Needs: No Transportation Needs   Lack of Transportation (Medical): No   Lack of Transportation (Non-Medical): No  Physical Activity: Inactive   Days of Exercise per Week: 0 days   Minutes of Exercise per Session: 0 min  Stress: No Stress Concern Present   Feeling of Stress : Not at all  Social Connections: Socially Integrated   Frequency of Communication with Friends and Family: Three times a week   Frequency of Social Gatherings with Friends and Family: More than three times a week   Attends Religious Services: More than 4 times per  year   Active Member of Clubs or Organizations: Yes   Attends Music therapist: More than 4 times per year   Marital Status: Married    Tobacco Counseling Counseling given: Not Answered   Clinical Intake:  Pre-visit preparation completed: Yes  Pain : No/denies pain     BMI - recorded: 28.73 Nutritional Status: BMI 25 -29 Overweight Nutritional Risks: None Diabetes: No  How often do you need to have someone help you when you read instructions, pamphlets, or other written materials from your doctor or pharmacy?: 1 - Never  Diabetic?No  Interpreter Needed?: No   Information entered by :: Orrin Brigham LPN   Activities of Daily Living In your present state of health, do you have any difficulty performing the following activities: 08/12/2021  Hearing? Y  Vision? N  Difficulty concentrating or making decisions? N  Walking or climbing stairs? N  Dressing or bathing? N  Doing errands, shopping? N  Preparing Food and eating ? N  Using the Toilet? N  In the past six months, have you accidently leaked urine? Y  Comment incontinence  Do you have problems with loss of  bowel control? N  Managing your Medications? N  Managing your Finances? N  Housekeeping or managing your Housekeeping? N  Some recent data might be hidden    Patient Care Team: Jinny Sanders, MD as PCP - General Owens Loffler, MD as Consulting Physician (Sports Medicine) Ralene Bathe, MD as Consulting Physician (Dermatology) Calvert Cantor, MD as Consulting Physician (Ophthalmology) Rodney Langton., MD as Consulting Physician (Dentistry) Vidal Schwalbe Yvetta Coder, FNP as Nurse Practitioner (Family Medicine)  Indicate any recent Medical Services you may have received from other than [facility] providers in the past year (date may be approximate).     Assessment:   This is a routine wellness examination for [facility].  Hearing/Vision screen Hearing Screening - Comments:: Patient states hearing an ocean sound in both ears Vision Screening - Comments:: Last exam 02/2021,[facility] wears readers  Dietary issues and exercise activities discussed: Current Exercise Habits: The patient does not participate in regular exercise at present   Goals Addressed             This Visit's Progress    Patient Stated       Would like to maintain current routine       Depression Screen PHQ 2/9 Scores 08/12/2021 08/11/2020 08/02/2019 07/26/2018 07/18/2017 05/19/2016 05/22/2015  PHQ - 2 Score 0 0 0 0 0 0 0  PHQ- 9 Score - 0 0 0 0 - -    Fall Risk Fall Risk  08/12/2021 08/11/2020 08/02/2019 07/26/2018 07/18/2017  Falls in the past year? 0 1 0 1 No  Comment - - - fell; multiple fractures to left foot -  Number falls in past yr: 0 0 0 0 -  Comment - - - - -  Injury with Fall? 0 0 0 1 -  Risk for fall due to : No Fall Risks Medication side effect Medication side effect - -  Follow up Falls prevention discussed Falls evaluation completed;Falls prevention discussed Falls evaluation completed;Falls prevention discussed - -    FALL RISK PREVENTION PERTAINING TO THE HOME:  Any stairs in or around the home?  Yes  If so, are there any without handrails? No  Home free of loose throw rugs in walkways, pet beds, electrical cords, etc? Yes  Adequate lighting in your home to reduce risk of falls? Yes  ASSISTIVE DEVICES UTILIZED TO PREVENT FALLS:  Life alert? No  Use of a cane, walker or w/c? No  Grab bars in the bathroom? No  Shower chair or bench in shower? No  Elevated toilet seat or a handicapped toilet? Yes   TIMED UP AND GO:  Was the test performed? No .    Cognitive Function: Normal cognitive status assessed by  this Nurse Health Advisor. No abnormalities found.   MMSE - Mini Mental State Exam 08/11/2020 08/02/2019 07/26/2018 07/18/2017 05/19/2016  Orientation to time 5 5 5 5 5   Orientation to Place 5 5 5 5 5   Registration 3 3 3 3 3   Attention/ Calculation 5 5 0 0 0  Recall 3 3 3 3 3   Language- name 2 objects - - 0 0 0  Language- repeat 1 1 1 1 1   Language- follow 3 step command - - 3 3 3   Language- read & follow direction - - 0 0 0  Write a sentence - - 0 0 0  Copy design - - 0 0 0  Total score - - 20 20 20         Immunizations Immunization History  Administered Date(s) Administered   Fluad Quad(high Dose 65+) 03/14/2019, 04/02/2020   Influenza Split 05/24/2011, 03/01/2013, 04/24/2015   Influenza Whole 03/27/2009   Influenza, High Dose Seasonal PF 02/29/2016, 04/07/2017, 05/24/2018   Influenza,inj,Quad PF,6+ Mos 04/03/2014   Pneumococcal Conjugate-13 04/03/2014, 04/07/2017   Pneumococcal Polysaccharide-23 03/27/2009   Tdap 03/11/2011   Zoster, Live 06/02/2014    TDAP status: Due, Education has been provided regarding the importance of this vaccine. Advised may receive this vaccine at local pharmacy or Health Dept. Aware to provide a copy of the vaccination record if obtained from local pharmacy or Health Dept. Verbalized acceptance and understanding.  Flu Vaccine status: Due, Education has been provided regarding the importance of this vaccine. Advised may receive this  vaccine at local pharmacy or Health Dept. Aware to provide a copy of the vaccination record if obtained from local pharmacy or Health Dept. Verbalized acceptance and understanding.  Pneumococcal vaccine status: Up to date  Covid-19 vaccine status: Information provided on how to obtain vaccines.   Qualifies for Shingles Vaccine? Yes   Zostavax completed Yes   Shingrix Completed?: No.    Education has been provided regarding the importance of this vaccine. Patient has been advised to call insurance company to determine out of pocket expense if they have not yet received this vaccine. Advised may also receive vaccine at local pharmacy or Health Dept. Verbalized acceptance and understanding.  Screening Tests Health Maintenance  Topic Date Due   Zoster Vaccines- Shingrix (1 of 2) Never done   TETANUS/TDAP  03/10/2021   COVID-19 Vaccine (1) 08/27/2021 (Originally 11/25/1941)   INFLUENZA VACCINE  09/10/2021 (Originally 01/11/2021)   MAMMOGRAM  09/08/2021   DEXA SCAN  09/03/2024   Pneumonia Vaccine 3+ Years old  Completed   HPV VACCINES  Aged Out    Health Maintenance  Health Maintenance Due  Topic Date Due   Zoster Vaccines- Shingrix (1 of 2) Never done   TETANUS/TDAP  03/10/2021    Colorectal cancer screening: No longer required.   Mammogram status: Ordered 08/12/21. Pt provided with contact info and advised to call to schedule appt.   Bone Density status: Ordered 08/12/21. Pt provided with contact info and advised to call to schedule appt.  Lung Cancer Screening: (Low Dose CT Chest recommended if Age 61-80 years, 30 pack-year currently  smoking OR have quit w/in 15years.) does not qualify.     Additional Screening:  Hepatitis C Screening: does not qualify  Vision Screening: Recommended annual ophthalmology exams for early detection of glaucoma and other disorders of the eye. Is the patient up to date with their annual eye exam?  Yes  Who is the provider or what is the name of the  office in which the patient attends annual eye exams? [place] Screening: Recommended annual dental exams for proper oral hygiene  Community Resource Referral / Chronic Care Management: CRR required this visit?  No   CCM required this visit?  No      Plan:     I have personally reviewed and noted the following in the patients chart:   Medical and social history Use of alcohol, tobacco or illicit drugs  Current medications and supplements including opioid prescriptions.  Functional ability and status Nutritional status Physical activity Advanced directives List of other physicians Hospitalizations, surgeries, and ER visits in previous 12 months Vitals Screenings to include cognitive, depression, and falls Referrals and appointments  In addition, I have reviewed and discussed with patient certain preventive protocols, quality metrics, and best practice recommendations. A written personalized care plan for preventive services as well as general preventive health recommendations were provided to patient.   Due to this being a telephonic visit, the after visit summary with patients personalized plan was offered to patient via mail or my-chart.  Patient preferred to pick up at office at next visit.    Loma Messing, LPN   06/18/1094   Nurse Health advisor  Nurse Notes: none

## 2021-08-11 NOTE — Assessment & Plan Note (Signed)
BP elevated at appointment today.  Patient currently working with PCP for better control. ?

## 2021-08-12 ENCOUNTER — Ambulatory Visit (INDEPENDENT_AMBULATORY_CARE_PROVIDER_SITE_OTHER): Payer: Medicare Other

## 2021-08-12 VITALS — Ht 66.0 in | Wt 178.0 lb

## 2021-08-12 DIAGNOSIS — Z1231 Encounter for screening mammogram for malignant neoplasm of breast: Secondary | ICD-10-CM

## 2021-08-12 DIAGNOSIS — Z780 Asymptomatic menopausal state: Secondary | ICD-10-CM

## 2021-08-12 DIAGNOSIS — Z0000 Encounter for general adult medical examination without abnormal findings: Secondary | ICD-10-CM | POA: Diagnosis not present

## 2021-08-12 NOTE — Patient Instructions (Signed)
[name] , Thank you for taking time to complete your Medicare Wellness Visit. I appreciate your ongoing commitment to your health goals. Please review the following plan we discussed and let me know if I can assist you in the future.   Screening recommendations/referrals: Colonoscopy: no longer required  Mammogram: up to date, completed 09/08/20, due 09/08/21, ordered today, someone will call to schedule an appointment  Bone Density: up to date, completed 09/04/19, due 09/03/21, ordered today, someone will call to schedule an appointment  Recommended yearly ophthalmology/optometry visit for glaucoma screening and checkup Recommended yearly dental visit for hygiene and checkup  Vaccinations: Influenza vaccine: Due-May obtain vaccine at our office or your local pharmacy. Pneumococcal vaccine: up to date Tdap vaccine: due, Medicare may cover in the event your are cut ir injured  Shingles vaccine: Discuss with pharmacy   Covid-19:newest booster available at your local pharmacy  Advanced directives: Please bring a copy of Living Will and/or [place] for your chart, when available    Conditions/risks identified: see problem list   Next appointment: Follow up in one year for your annual wellness visit    Preventive Care 65 Years and Older, Female Preventive care refers to lifestyle choices and visits with your health care provider that can promote health and wellness. What does preventive care include? A yearly physical exam. This is also called an annual well check. Dental exams once or twice a year. Routine eye exams. Ask your health care provider how often you should have your eyes checked. Personal lifestyle choices, including: Daily care of your teeth and gums. Regular physical activity. Eating a healthy diet. Avoiding tobacco and drug use. Limiting alcohol use. Practicing safe sex. Taking low-dose aspirin every day. Taking vitamin and mineral supplements as  recommended by your health care provider. What happens during an annual well check? The services and screenings done by your health care provider during your annual well check will depend on your age, overall health, lifestyle risk factors, and family history of disease. Counseling  Your health care provider may ask you questions about your: Alcohol use. Tobacco use. Drug use. Emotional well-being. Home and relationship well-being. Sexual activity. Eating habits. History of falls. Memory and ability to understand (cognition). Work and work Statistician. Reproductive health. Screening  You may have the following tests or measurements: Height, weight, and BMI. Blood pressure. Lipid and cholesterol levels. These may be checked every 5 years, or more frequently if you are over 34 years old. Skin check. Lung cancer screening. You may have this screening every year starting at age 19 if you have a 30-pack-year history of smoking and currently smoke or have quit within the past 15 years. Fecal occult blood test (FOBT) of the stool. You may have this test every year starting at age 36. Flexible sigmoidoscopy or colonoscopy. You may have a sigmoidoscopy every 5 years or a colonoscopy every 10 years starting at age 66. Hepatitis C blood test. Hepatitis B blood test. Sexually transmitted disease (STD) testing. Diabetes screening. This is done by checking your blood sugar (glucose) after you have not eaten for a while (fasting). You may have this done every 1-3 years. Bone density scan. This is done to screen for osteoporosis. You may have this done starting at age 42. Mammogram. This may be done every 1-2 years. Talk to your health care provider about how often you should have regular mammograms. Talk with your health care provider about your test results, treatment options, and if  necessary, the need for more tests. Vaccines  Your health care provider may recommend certain vaccines, such  as: Influenza vaccine. This is recommended every year. Tetanus, diphtheria, and acellular pertussis (Tdap, Td) vaccine. You may need a Td booster every 10 years. Zoster vaccine. You may need this after age 33. Pneumococcal 13-valent conjugate (PCV13) vaccine. One dose is recommended after age 53. Pneumococcal polysaccharide (PPSV23) vaccine. One dose is recommended after age 13. Talk to your health care provider about which screenings and vaccines you need and how often you need them. This information is not intended to replace advice given to you by your health care provider. Make sure you discuss any questions you have with your health care provider. Document Released: 06/26/2015 Document Revised: 02/17/2016 Document Reviewed: 03/31/2015 Elsevier Interactive Patient Education  2017 [place] Prevention in the Home Falls can cause injuries. They can happen to people of all ages. There are many things you can do to make your home safe and to help prevent falls. What can I do on the outside of my home? Regularly fix the edges of walkways and driveways and fix any cracks. Remove anything that might make you trip as you walk through a door, such as a raised step or threshold. Trim any bushes or trees on the path to your home. Use bright outdoor lighting. Clear any walking paths of anything that might make someone trip, such as rocks or tools. Regularly check to see if handrails are loose or broken. Make sure that both sides of any steps have handrails. Any raised decks and porches should have guardrails on the edges. Have any leaves, snow, or ice cleared regularly. Use sand or salt on walking paths during winter. Clean up any spills in your garage right away. This includes oil or grease spills. What can I do in the bathroom? Use night lights. Install grab bars by the toilet and in the tub and shower. Do not use towel bars as grab bars. Use non-skid mats or decals in the tub or  shower. If you need to sit down in the shower, use a plastic, non-slip stool. Keep the floor dry. Clean up any water that spills on the floor as soon as it happens. Remove soap buildup in the tub or shower regularly. Attach bath mats securely with double-sided non-slip rug tape. Do not have throw rugs and other things on the floor that can make you trip. What can I do in the bedroom? Use night lights. Make sure that you have a light by your bed that is easy to reach. Do not use any sheets or blankets that are too big for your bed. They should not hang down onto the floor. Have a firm chair that has side arms. You can use this for support while you get dressed. Do not have throw rugs and other things on the floor that can make you trip. What can I do in the kitchen? Clean up any spills right away. Avoid walking on wet floors. Keep items that you use a lot in easy-to-reach places. If you need to reach something above you, use a strong step stool that has a grab bar. Keep electrical cords out of the way. Do not use floor polish or wax that makes floors slippery. If you must use wax, use non-skid floor wax. Do not have throw rugs and other things on the floor that can make you trip. What can I do with my stairs? Do not leave any items  on the stairs. Make sure that there are handrails on both sides of the stairs and use them. Fix handrails that are broken or loose. Make sure that handrails are as long as the stairways. Check any carpeting to make sure that it is firmly attached to the stairs. Fix any carpet that is loose or worn. Avoid having throw rugs at the top or bottom of the stairs. If you do have throw rugs, attach them to the floor with carpet tape. Make sure that you have a light switch at the top of the stairs and the bottom of the stairs. If you do not have them, ask someone to add them for you. What else can I do to help prevent falls? Wear shoes that: Do not have high heels. Have  rubber bottoms. Are comfortable and fit you well. Are closed at the toe. Do not wear sandals. If you use a stepladder: Make sure that it is fully opened. Do not climb a closed stepladder. Make sure that both sides of the stepladder are locked into place. Ask someone to hold it for you, if possible. Clearly mark and make sure that you can see: Any grab bars or handrails. First and last steps. Where the edge of each step is. Use tools that help you move around (mobility aids) if they are needed. These include: Canes. Walkers. Scooters. Crutches. Turn on the lights when you go into a dark area. Replace any light bulbs as soon as they burn out. Set up your furniture so you have a clear path. Avoid moving your furniture around. If any of your floors are uneven, fix them. If there are any pets around you, be aware of where they are. Review your medicines with your doctor. Some medicines can make you feel dizzy. This can increase your chance of falling. Ask your doctor what other things that you can do to help prevent falls. This information is not intended to replace advice given to you by your health care provider. Make sure you discuss any questions you have with your health care provider. Document Released: 03/26/2009 Document Revised: 11/05/2015 Document Reviewed: 07/04/2014 Elsevier Interactive Patient Education  2017 [employer].

## 2021-08-15 ENCOUNTER — Encounter: Payer: Self-pay | Admitting: Cardiology

## 2021-08-15 DIAGNOSIS — I700 Atherosclerosis of aorta: Secondary | ICD-10-CM | POA: Insufficient documentation

## 2021-08-15 NOTE — Assessment & Plan Note (Signed)
With coronary and thoracic aortic atherosclerosis, lipid risk factor modification with lipid management and blood pressure control.  Is on statin and adequate blood pressure control . ? ?

## 2021-08-15 NOTE — Assessment & Plan Note (Signed)
Blood pressure still controlled. ?Continue combination of ARB and HCTZ.  Preserved EF of echo and no heart failure symptoms. ?

## 2021-08-15 NOTE — Assessment & Plan Note (Signed)
Minimal disease noted on Coronary CTA. ?Not to the extent that would warrant aspirin. ?Recommend lipid management with LDL target less than 100. ? ?

## 2021-08-15 NOTE — Assessment & Plan Note (Signed)
Should be due for labs recheck by PCP soon.  Her last lipids showed LDL of 104.  She is currently on modest dose Crestor.  Pretty much close to goal.  If LDL goes wrong way, would probably increase to 20 mg. ?

## 2021-08-15 NOTE — Assessment & Plan Note (Addendum)
Still multifactorial but probably related to asthma and deconditioning as well as interstitial lung disease.  Being seen by pulmonary medicine now.  Echocardiogram was unremarkable with normal EF and normal diastolic function.  Also cardiac CTA did not show any significant CAD.  PA is normal in size arguing against pulmonary hypertension. ? With a low Coronary Calcium Score, I would not consider microvascular ischemia as etiology. ? ?Agree with pulmonary evaluation.  ?

## 2021-08-17 ENCOUNTER — Telehealth: Payer: Self-pay | Admitting: Nurse Practitioner

## 2021-08-17 NOTE — Telephone Encounter (Signed)
Patty Shelton I wanted to confirm with you first before I called this pharmacy.  ? ?Do you want the Adviar as needed? ? ?Please advise [name]  ?

## 2021-08-17 NOTE — Telephone Encounter (Signed)
No, she's supposed to be on Advair 115 2 puffs Twice daily. Not as needed. Thanks!

## 2021-08-17 NOTE — Telephone Encounter (Signed)
Called and clarified order for Advair.  ? ?Pharmacist took a verbal order over the phone to change order per [facility] NP directions.  ? ?Nothing further  ?

## 2021-08-23 DIAGNOSIS — Z20822 Contact with and (suspected) exposure to covid-19: Secondary | ICD-10-CM | POA: Diagnosis not present

## 2021-08-27 ENCOUNTER — Ambulatory Visit: Payer: Medicare Other | Admitting: Family Medicine

## 2021-08-31 ENCOUNTER — Other Ambulatory Visit: Payer: Self-pay | Admitting: Family Medicine

## 2021-08-31 NOTE — Telephone Encounter (Signed)
Refill request Famvir ?Last refill 08/18/20 #90/3 ?Last office visit 05/04/21 ?Patient cancelled appointment 08/27/21 ?

## 2021-09-09 ENCOUNTER — Ambulatory Visit (INDEPENDENT_AMBULATORY_CARE_PROVIDER_SITE_OTHER): Payer: Medicare Other | Admitting: Family Medicine

## 2021-09-09 ENCOUNTER — Encounter: Payer: Self-pay | Admitting: Family Medicine

## 2021-09-09 VITALS — BP 130/62 | HR 83 | Temp 98.4°F | Ht 66.0 in | Wt 180.0 lb

## 2021-09-09 DIAGNOSIS — I25118 Atherosclerotic heart disease of native coronary artery with other forms of angina pectoris: Secondary | ICD-10-CM | POA: Diagnosis not present

## 2021-09-09 DIAGNOSIS — I10 Essential (primary) hypertension: Secondary | ICD-10-CM | POA: Diagnosis not present

## 2021-09-09 DIAGNOSIS — J849 Interstitial pulmonary disease, unspecified: Secondary | ICD-10-CM | POA: Diagnosis not present

## 2021-09-09 NOTE — Patient Instructions (Signed)
Continue current regimen

## 2021-09-09 NOTE — Assessment & Plan Note (Signed)
Chronic, stable control  ?Followed by pulmonary ?

## 2021-09-09 NOTE — Assessment & Plan Note (Signed)
Chronic, well controlled ? ?Continue HCTZ 12.5 mg daily along with losartan 100 mg daily.  She is tolerating these well. ?

## 2021-09-09 NOTE — Progress Notes (Signed)
? ? Patient ID: Patty Shelton, female    DOB: [birth year], 81 y.o.   MRN: [id number] ? ?This visit was conducted in person. ? ?BP 130/62   Pulse 83   Temp 98.4 ?F (36.9 ?C) (Oral)   Ht '5\' 6"'$  (1.676 m)   Wt 180 lb (81.6 kg)   SpO2 95%   BMI 29.05 kg/m?   ? ?CC:  ?Chief Complaint  ?Patient presents with  ? Blood Pressure Check  ? ? ?Subjective:  ? ?HPI: ?Patty Shelton is a 81 y.o. female presenting on 09/09/2021 for Blood Pressure Check ? ? ?Hypertension:  At goal  in office today on HCTZ 12.5 mg daily and losartan 100 mg daily. ?BP Readings from Last 3 Encounters:  ?09/09/21 130/62  ?08/11/21 (!) 150/60  ?07/08/21 136/70  ?Using medication without problems or lightheadedness:  ?Chest pain with exertion: none ?Edema: none ?Short of breath: ?Average home BPs: unreliable cuff at home.. not checking regularly. ?Other issues: ? ? Reviewed OV note from Dr. Ellyn Hack from 07/08/2021 ?Preserved EF on ECHO ? Minimal coronary atherosclerosis on coronary CT ?   ? Reviewed Pulmonary OV note from 08/11/2021  Treating  bronchiectasis/ILD and Asthma ?Using Advair BID, zyrtec and flonase. ? ?Relevant past medical, surgical, family and social history reviewed and updated as indicated. Interim medical history since our last visit reviewed. ?Allergies and medications reviewed and updated. ?Outpatient Medications Prior to Visit  ?Medication Sig Dispense Refill  ? acetaminophen (TYLENOL) 500 MG tablet Take 500 mg by mouth every 6 (six) hours as needed.    ? Biotin 10 MG TABS Take 1 tablet by mouth daily.    ? cetirizine (ZYRTEC) 10 MG tablet Take 10 mg by mouth daily.    ? Cholecalciferol (VITAMIN D3) 50 MCG (2000 UT) TABS Take 1 tablet by mouth daily.    ? CRANBERRY PO Take 1 capsule by mouth daily.    ? diclofenac sodium (VOLTAREN) 1 % GEL Apply 4 g topically 4 (four) times daily as needed.    ? famciclovir (FAMVIR) 250 MG tablet TAKE 1 TABLET DAILY 90 tablet 3  ? fluticasone (FLONASE) 50 MCG/ACT nasal spray Place 2 sprays into both  nostrils as needed.     ? fluticasone-salmeterol (ADVAIR HFA) 115-21 MCG/ACT inhaler Inhale 2 puffs into the lungs 2 (two) times daily as needed. 1 each 5  ? guaiFENesin (MUCINEX) 600 MG 12 hr tablet Take 600 mg by mouth 2 (two) times daily as needed.    ? hydrochlorothiazide (HYDRODIURIL) 25 MG tablet Take 0.5 tablets (12.5 mg total) by mouth daily. 90 tablet 3  ? losartan (COZAAR) 100 MG tablet Take 1 tablet (100 mg total) by mouth daily. 90 tablet 3  ? Multiple Minerals-Vitamins (CAL-MAG-ZINC-D PO) Take 1 tablet by mouth daily.    ? Respiratory Therapy Supplies (FLUTTER) DEVI 1 Device by Does not apply route as directed. 1 each 0  ? rosuvastatin (CRESTOR) 10 MG tablet Take 1 tablet (10 mg total) by mouth daily. 90 tablet 3  ? vitamin B-12 (CYANOCOBALAMIN) 1000 MCG tablet Take 1,000 mcg by mouth daily.    ? vitamin E 180 MG (400 UNITS) capsule Take 400 Units by mouth daily.    ? albuterol (VENTOLIN HFA) 108 (90 Base) MCG/ACT inhaler Inhale 2 puffs into the lungs every 6 (six) hours as needed for wheezing or shortness of breath. 1 each 2  ? ?No facility-administered medications prior to visit.  ?  ? ?Per HPI unless specifically indicated in ROS  section below ?Review of Systems  ?Constitutional:  Negative for fatigue and fever.  ?HENT:  Negative for ear pain.   ?Eyes:  Negative for pain.  ?Respiratory:  Negative for chest tightness and shortness of breath.   ?Cardiovascular:  Negative for chest pain, palpitations and leg swelling.  ?Gastrointestinal:  Negative for abdominal pain.  ?Genitourinary:  Negative for dysuria.  ?Objective:  ?BP 130/62   Pulse 83   Temp 98.4 ?F (36.9 ?C) (Oral)   Ht '5\' 6"'$  (1.676 m)   Wt 180 lb (81.6 kg)   SpO2 95%   BMI 29.05 kg/m?   ?Wt Readings from Last 3 Encounters:  ?09/09/21 180 lb (81.6 kg)  ?08/12/21 178 lb (80.7 kg)  ?08/11/21 178 lb 9.6 oz (81 kg)  ?  ?  ?Physical Exam ?Constitutional:   ?   General: She is not in acute distress. ?   Appearance: Normal appearance. She is  well-developed. She is not ill-appearing or toxic-appearing.  ?HENT:  ?   Head: Normocephalic.  ?   Right Ear: Hearing, tympanic membrane, ear canal and external ear normal. Tympanic membrane is not erythematous, retracted or bulging.  ?   Left Ear: Hearing, tympanic membrane, ear canal and external ear normal. Tympanic membrane is not erythematous, retracted or bulging.  ?   Nose: No mucosal edema or rhinorrhea.  ?   Right Sinus: No maxillary sinus tenderness or frontal sinus tenderness.  ?   Left Sinus: No maxillary sinus tenderness or frontal sinus tenderness.  ?   Mouth/Throat:  ?   Pharynx: Uvula midline.  ?Eyes:  ?   General: Lids are normal. Lids are everted, no foreign bodies appreciated.  ?   Conjunctiva/sclera: Conjunctivae normal.  ?   Pupils: Pupils are equal, round, and reactive to light.  ?Neck:  ?   Thyroid: No thyroid mass or thyromegaly.  ?   Vascular: No carotid bruit.  ?   Trachea: Trachea normal.  ?Cardiovascular:  ?   Rate and Rhythm: Normal rate and regular rhythm.  ?   Pulses: Normal pulses.  ?   Heart sounds: Normal heart sounds, S1 normal and S2 normal. No murmur heard. ?  No friction rub. No gallop.  ?Pulmonary:  ?   Effort: Pulmonary effort is normal. No tachypnea or respiratory distress.  ?   Breath sounds: Normal breath sounds. No decreased breath sounds, wheezing, rhonchi or rales.  ?Abdominal:  ?   General: Bowel sounds are normal.  ?   Palpations: Abdomen is soft.  ?   Tenderness: There is no abdominal tenderness.  ?Musculoskeletal:  ?   Cervical back: Normal range of motion and neck supple.  ?Skin: ?   General: Skin is warm and dry.  ?   Findings: No rash.  ?Neurological:  ?   Mental Status: She is alert.  ?Psychiatric:     ?   Mood and Affect: Mood is not anxious or depressed.     ?   Speech: Speech normal.     ?   Behavior: Behavior normal. Behavior is cooperative.     ?   Thought Content: Thought content normal.     ?   Judgment: Judgment normal.  ? ?   ?Results for orders placed  or performed in visit on 05/27/21  ?ECHOCARDIOGRAM COMPLETE  ?Result Value Ref Range  ? AR max vel 1.97 cm2  ? AV Peak grad 8.2 mmHg  ? Ao pk vel 1.43 m/s  ? S' Lateral 3.30 cm  ?  Area-P 1/2 2.72 cm2  ? AV Area VTI 2.16 cm2  ? AV Mean grad 5.0 mmHg  ? Single Plane A4C EF 54.6 %  ? Single Plane A2C EF 53.5 %  ? Calc EF 53.9 %  ? AV Area mean vel 1.86 cm2  ? ? ?This visit occurred during the SARS-CoV-2 public health emergency.  Safety protocols were in place, including screening questions prior to the visit, additional usage of staff PPE, and extensive cleaning of exam room while observing appropriate contact time as indicated for disinfecting solutions.  ? ?COVID 19 screen:  No recent travel or known exposure to [place] ?The patient denies respiratory symptoms of COVID 19 at this time. ?The importance of social distancing was discussed today.  ? ?Assessment and Plan ? ?  ?Problem List Items Addressed This Visit   ? ? Primary hypertension - Primary (Chronic)  ?  Chronic, well controlled ? ?Continue HCTZ 12.5 mg daily along with losartan 100 mg daily.  She is tolerating these well. ?  ?  ? ILD (interstitial lung disease) ([place])  ?  Chronic, stable control  ?Followed by pulmonary ?  ?  ? ? ? ?Eliezer Lofts, MD  ? ?

## 2021-09-29 DIAGNOSIS — Z20822 Contact with and (suspected) exposure to covid-19: Secondary | ICD-10-CM | POA: Diagnosis not present

## 2021-10-09 DIAGNOSIS — Z20822 Contact with and (suspected) exposure to covid-19: Secondary | ICD-10-CM | POA: Diagnosis not present

## 2021-10-28 ENCOUNTER — Ambulatory Visit (INDEPENDENT_AMBULATORY_CARE_PROVIDER_SITE_OTHER): Payer: Medicare Other | Admitting: Dermatology

## 2021-10-28 DIAGNOSIS — L821 Other seborrheic keratosis: Secondary | ICD-10-CM | POA: Diagnosis not present

## 2021-10-28 DIAGNOSIS — Z1283 Encounter for screening for malignant neoplasm of skin: Secondary | ICD-10-CM

## 2021-10-28 DIAGNOSIS — I25118 Atherosclerotic heart disease of native coronary artery with other forms of angina pectoris: Secondary | ICD-10-CM

## 2021-10-28 DIAGNOSIS — L578 Other skin changes due to chronic exposure to nonionizing radiation: Secondary | ICD-10-CM | POA: Diagnosis not present

## 2021-10-28 DIAGNOSIS — L84 Corns and callosities: Secondary | ICD-10-CM | POA: Diagnosis not present

## 2021-10-28 DIAGNOSIS — L820 Inflamed seborrheic keratosis: Secondary | ICD-10-CM | POA: Diagnosis not present

## 2021-10-28 NOTE — Progress Notes (Signed)
   Follow-Up Visit   Subjective  Patty Shelton is a 81 y.o. female who presents for the following: growth (L thigh, 9mArms, few months/Chest, 441mitchy/) and Callouses (R foot, wants to know how to treat). The patient presents for Total-Body Skin Exam (TBSE) for skin cancer screening and mole check.  The patient has spots, moles and lesions to be evaluated, some may be new or changing and the patient has concerns that these could be cancer.   The following portions of the chart were reviewed this encounter and updated as appropriate:   Tobacco  Allergies  Meds  Problems  Med Hx  Surg Hx  Fam Hx     Review of Systems:  No other skin or systemic complaints except as noted in HPI or Assessment and Plan.  Objective  Well appearing patient in no apparent distress; mood and affect are within normal limits.  A focused examination was performed including left thigh, arms, chest, right foot. Relevant physical exam findings are noted in the Assessment and Plan.  L ant lat thigh x 1, L forearm x 3, R popltieal x 3, chest x 10 (17) Stuck on waxy paps with erythema  R medial foot Hyperkeratotic patches   Assessment & Plan   Seborrheic Keratoses - Stuck-on, waxy, tan-brown papules and/or plaques  - Benign-appearing - Discussed benign etiology and prognosis. - Observe - Call for any changes  Actinic Damage - chronic, secondary to cumulative UV radiation exposure/sun exposure over time - diffuse scaly erythematous macules with underlying dyspigmentation - Recommend daily broad spectrum sunscreen SPF 30+ to sun-exposed areas, reapply every 2 hours as needed.  - Recommend staying in the shade or wearing long sleeves, sun glasses (UVA+UVB protection) and wide brim hats (4-inch brim around the entire circumference of the hat). - Call for new or changing lesions.   Inflamed seborrheic keratosis (17) L ant lat thigh x 1, L forearm x 3, R popltieal x 3, chest x 10 Symptomatic,  irritating, patient would like treated.  Destruction of lesion - L ant lat thigh x 1, L forearm x 3, R popltieal x 3, chest x 10 Complexity: simple   Destruction method: cryotherapy   Informed consent: discussed and consent obtained   Timeout:  patient name, date of birth, surgical site, and procedure verified Lesion destroyed using liquid nitrogen: Yes   Region frozen until ice ball extended beyond lesion: Yes   Outcome: patient tolerated procedure well with no complications   Post-procedure details: wound care instructions given    Callus R medial foot Benign Discussed Sal Acid pads Discussed pt seeing podiatrist Discussed using a PedEgg Discussed Urea cream 40%  Return if symptoms worsen or fail to improve.  I, SoOthelia PullingRMA, am acting as scribe for DaSarina SerMD . Documentation: I have reviewed the above documentation for accuracy and completeness, and I agree with the above.  DaSarina SerMD

## 2021-10-28 NOTE — Patient Instructions (Addendum)
Cryotherapy Aftercare  Wash gently with soap and water everyday.   Apply Vaseline and Band-Aid daily until healed.   Discussed Sal Acid pads Discussed patient seeing podiatrist Discussed using a PedEgg Discussed Urea cream 40%  If You Need Anything After Your Visit  If you have any questions or concerns for your doctor, please call our main line at [phone] and press option 4 to reach your doctor's medical assistant. If no one answers, please leave a voicemail as directed and we will return your call as soon as possible. Messages left after 4 pm will be answered the following business day.   You may also send [place] a message via [place]. We typically respond to MyChart messages within 1-2 business days.  For prescription refills, please ask your pharmacy to contact our office. Our fax number is [phone].  If you have an urgent issue when the clinic is closed that cannot wait until the next business day, you can page your doctor at the number below.    Please note that while we do our best to be available for urgent issues outside of office hours, we are not available 24/7.   If you have an urgent issue and are unable to reach [place], you may choose to seek medical care at your doctor's office, retail clinic, urgent care center, or emergency room.  If you have a medical emergency, please immediately call 911 or go to the emergency department.  Pager Numbers  - Dr. Nehemiah Massed: [phone]  - Dr. Laurence Ferrari: [phone]  - Dr. Nicole Kindred: [phone]  In the event of inclement weather, please call our main line at [phone] for an update on the status of any delays or closures.  Dermatology Medication Tips: Please keep the boxes that topical medications come in in order to help keep track of the instructions about where and how to use these. Pharmacies typically print the medication instructions only on the boxes and not directly on the medication tubes.   If your medication is too  expensive, please contact our office at [phone] option 4 or send [place] a message through [place].   We are unable to tell what your co-pay for medications will be in advance as this is different depending on your insurance coverage. However, we may be able to find a substitute medication at lower cost or fill out paperwork to get insurance to cover a needed medication.   If a prior authorization is required to get your medication covered by your insurance company, please allow [place] 1-2 business days to complete this process.  Drug prices often vary depending on where the prescription is filled and some pharmacies may offer cheaper prices.  The website www.goodrx.com contains coupons for medications through different pharmacies. The prices here do not account for what the cost may be with help from insurance (it may be cheaper with your insurance), but the website can give you the price if you did not use any insurance.  - You can print the associated coupon and take it with your prescription to the pharmacy.  - You may also stop by our office during regular business hours and pick up a GoodRx coupon card.  - If you need your prescription sent electronically to a different pharmacy, notify our office through [facility] or by phone at [phone] option 4.     Si Usted Necesita Algo Despus de Su Visita  Tambin puede enviarnos un mensaje a travs de Pharmacist, community. Por lo general respondemos a los mensajes de  MyChart en el transcurso de 1 a 2 das hbiles.  Para renovar recetas, por favor pida a su farmacia que se ponga en contacto con nuestra oficina. Harland Dingwall de fax es [place] [phone].  Si tiene un asunto urgente cuando la clnica est cerrada y que no puede esperar hasta el siguiente da hbil, puede llamar/localizar a su doctor(a) al nmero que aparece a continuacin.   Por favor, tenga en cuenta que aunque hacemos todo lo posible para estar disponibles para asuntos urgentes fuera  del horario de [place], no estamos disponibles las 24 horas del da, los 7 das de la [place].   Si tiene un problema urgente y no puede comunicarse con nosotros, puede optar por buscar atencin mdica  en el consultorio de su doctor(a), en una clnica privada, en un centro de atencin urgente o en una sala de emergencias.  Si tiene Engineering geologist, por favor llame inmediatamente al 911 o vaya a la sala de emergencias.  Nmeros de bper  - Dr. Nehemiah Massed: [phone]  - Dra. Moye: [phone]  - Dra. Nicole Kindred: [phone]  En caso de inclemencias del [place], por favor llame a Johnsie Kindred principal al [phone] para una actualizacin sobre el [place] de cualquier retraso o cierre.  Consejos para la medicacin en dermatologa: Por favor, guarde las cajas en las que vienen los medicamentos de uso tpico para ayudarle a seguir las instrucciones sobre dnde y cmo usarlos. Las farmacias generalmente imprimen las instrucciones del medicamento slo en las cajas y no directamente en los tubos del [place].   Si su medicamento es muy caro, por favor, pngase en contacto con Zigmund Daniel llamando al [phone] y presione la opcin 4 o envenos un mensaje a travs de Pharmacist, community.   No podemos decirle cul ser su copago por los medicamentos por adelantado ya que esto es diferente dependiendo de la cobertura de su seguro. Sin embargo, es posible que podamos encontrar un medicamento sustituto a Electrical engineer un formulario para que el seguro cubra el medicamento que se considera necesario.   Si se requiere una autorizacin previa para que su compaa de seguros [place] su medicamento, por favor permtanos de 1 a 2 das hbiles para completar este proceso.  Los precios de los medicamentos varan con frecuencia dependiendo del Environmental consultant de dnde se surte la receta y alguna farmacias pueden ofrecer precios ms baratos.  El sitio web www.goodrx.com tiene cupones para medicamentos de Office manager. Los precios aqu no tienen en cuenta lo que podra costar con la ayuda del seguro (puede ser ms barato con su seguro), pero el sitio web puede darle el precio si no utiliz Research scientist (physical sciences).  - Puede imprimir el cupn correspondiente y llevarlo con su receta a la farmacia.  - Tambin puede pasar por nuestra oficina durante el horario de atencin regular y Charity fundraiser una tarjeta de cupones de GoodRx.  - Si necesita que su receta se enve electrnicamente a una farmacia diferente, informe a nuestra oficina a travs de MyChart de [place] o por telfono llamando al [phone] y presione la opcin 4.

## 2021-11-03 ENCOUNTER — Other Ambulatory Visit: Payer: Self-pay | Admitting: Family Medicine

## 2021-11-03 NOTE — Telephone Encounter (Signed)
Last office visit 09/09/21 for BP check.  Last refilled 08/18/20 for #90 with 3 refills.  Last Lipid Panel 08/12/20.  Next Appt: 04/26/22 but I don't know what she is being seen for because there is no appointment notes.   Ok to refill?

## 2021-11-07 ENCOUNTER — Encounter: Payer: Self-pay | Admitting: Dermatology

## 2021-11-17 ENCOUNTER — Ambulatory Visit (INDEPENDENT_AMBULATORY_CARE_PROVIDER_SITE_OTHER): Payer: Medicare Other | Admitting: Internal Medicine

## 2021-11-17 ENCOUNTER — Encounter: Payer: Self-pay | Admitting: Internal Medicine

## 2021-11-17 VITALS — BP 142/66 | HR 84 | Temp 97.9°F | Ht 66.0 in | Wt 179.8 lb

## 2021-11-17 DIAGNOSIS — J479 Bronchiectasis, uncomplicated: Secondary | ICD-10-CM

## 2021-11-17 DIAGNOSIS — J411 Mucopurulent chronic bronchitis: Secondary | ICD-10-CM | POA: Diagnosis not present

## 2021-11-17 DIAGNOSIS — I25118 Atherosclerotic heart disease of native coronary artery with other forms of angina pectoris: Secondary | ICD-10-CM

## 2021-11-17 NOTE — Patient Instructions (Addendum)
TRY Advair HFA 1 puff in a.m. and 1 puff in p.m. Rinse mouth and brush her teeth after every use Monitor respiratory symptoms  Avoid sick contacts Exercise as tolerated   RECOMMEND FLUTTER VALVE USAGE MORE FREQUENTLY

## 2021-11-17 NOTE — Progress Notes (Signed)
[facility] Consultation      Date: 10/08/2020,   MRN# [id number] Patty Shelton September 22, 1940    HRCT Chest 09/2018 -Spectrum of findings suggestive of mild basilar predominant interstitial lung disease with mild traction bronchiolectasis and ground-glass predominance. No frank honeycombing. No definite progression since 11/27/2016   04/02/2020 Follow up : Bronchiectasis and ILD Patient presents for a 1 year follow-up.  Patient has underlying mild bronchiectasis and ILD.  She is a never smoker.  She says since last visit she is doing well with no flare of cough or wheezing.  Has not been on antibiotics or steroids over the last year.  She says her activity level as at baseline.  She does not do any formal exercise but is very active at home.  Does her own housework.  Is independent and drives.  She has not on oxygen.  She declines the Covid vaccines and says she absolutely will not get this.  She does request a flu shot. Previous high-resolution CT chest in April 2020 showed stable ILD and mild bronchiectasis with no progression since 2018. She is supposed to be using the flutter valve but says that she is not using this.  She is on Flovent.  But says she only uses this as needed. She says she has a cough that has been at baseline.  Is minimally productive.    CHIEF COMPLAINT:   Follow-up bronchiectasis Follow-up bronchitis     HISTORY OF PRESENT ILLNESS   Intermittent coughing and shortness of breath improved since last office visit  Patient has chronic bronchiectasis chronic productive cough consistent with chronic bronchitis  Arnuity was changed to Advair HFA and this seems to help relieve her symptoms Continue Advair HFA as prescribed however will decrease the dose by 50% to see if this would keep her symptoms at bay   No exacerbation at this time No evidence of heart failure at this time No evidence or signs of infection at this time No respiratory  distress No fevers, chills, nausea, vomiting, diarrhea No evidence of lower extremity edema No evidence hemoptysis   No signs of infection at this time Arnuity is has helped significantly in the past so we will represcribe this  Non Smoker Patient is noncompliant with flutter valve Recommend 10-15 times per day     Past Medical History:  Diagnosis Date   Agatston CAC score, <100 05/04/2021   Coronary CTA : Coronary Calcium Score 27.  Minimal proximal (dominant) RCA calcified plaque (0-24%) with no plaque noted in the LAD and nondominant LCx.   Hyperlipidemia with target LDL less than 100 03/27/2009   Qualifier: Diagnosis of  By: Diona Browner MD, Amy     Hypertension     Social History   Tobacco Use   Smoking status: Never   Smokeless tobacco: Never  Vaping Use   Vaping Use: Never used  Substance Use Topics   Alcohol use: No    Alcohol/week: 0.0 standard drinks   Drug use: No     MEDICATIONS    Home Medication:  Current Outpatient Rx   Order #: [id number] Class: Historical Med   Order #: [id number] Class: Historical Med   Order #: [id number] Class: Historical Med   Order #: [id number] Class: Historical Med   Order #: [id number] Class: Historical Med   Order #: [id number] Class: Historical Med   Order #: [id number] Class: Normal   Order #: [id number] Class: Historical Med   Order #: [id number] Class: Normal   Order #: [id number] Class: Historical Med   Order #:  [id number] Class: Normal   Order #: [id number] Class: Normal   Order #: [id number] Class: Historical Med   Order #: [id number] Class: Print   Order #: [id number] Class: Normal   Order #: [id number] Class: Historical Med   Order #: [id number] Class: Historical Med    Current Medication:  Current Outpatient Medications:    acetaminophen (TYLENOL) 500 MG tablet, Take 500 mg by mouth every 6 (six) hours as needed., Disp: , Rfl:    Biotin 10 MG TABS, Take 1 tablet by mouth daily., Disp: , Rfl:    cetirizine (ZYRTEC) 10 MG tablet, Take 10 mg  by mouth daily., Disp: , Rfl:    Cholecalciferol (VITAMIN D3) 50 MCG (2000 UT) TABS, Take 1 tablet by mouth daily., Disp: , Rfl:    CRANBERRY PO, Take 1 capsule by mouth daily., Disp: , Rfl:    diclofenac sodium (VOLTAREN) 1 % GEL, Apply 4 g topically 4 (four) times daily as needed., Disp: , Rfl:    famciclovir (FAMVIR) 250 MG tablet, TAKE 1 TABLET DAILY, Disp: 90 tablet, Rfl: 3   fluticasone (FLONASE) 50 MCG/ACT nasal spray, Place 2 sprays into both nostrils as needed. , Disp: , Rfl:    fluticasone-salmeterol (ADVAIR HFA) 115-21 MCG/ACT inhaler, Inhale 2 puffs into the lungs 2 (two) times daily as needed., Disp: 1 each, Rfl: 5   guaiFENesin (MUCINEX) 600 MG 12 hr tablet, Take 600 mg by mouth 2 (two) times daily as needed., Disp: , Rfl:    hydrochlorothiazide (HYDRODIURIL) 25 MG tablet, Take 0.5 tablets (12.5 mg total) by mouth daily., Disp: 90 tablet, Rfl: 3   losartan (COZAAR) 100 MG tablet, Take 1 tablet (100 mg total) by mouth daily., Disp: 90 tablet, Rfl: 3   Multiple Minerals-Vitamins (CAL-MAG-ZINC-D PO), Take 1 tablet by mouth daily., Disp: , Rfl:    Respiratory Therapy Supplies (FLUTTER) DEVI, 1 Device by Does not apply route as directed., Disp: 1 each, Rfl: 0   rosuvastatin (CRESTOR) 10 MG tablet, TAKE 1 TABLET DAILY, Disp: 90 tablet, Rfl: 3   vitamin B-12 (CYANOCOBALAMIN) 1000 MCG tablet, Take 1,000 mcg by mouth daily., Disp: , Rfl:    vitamin E 180 MG (400 UNITS) capsule, Take 400 Units by mouth daily., Disp: , Rfl:     ALLERGIES   Amlodipine besylate and Lipitor [atorvastatin]     REVIEW OF SYSTEMS      Review of Systems: Gen:  Denies  fever, sweats, chills weight loss  HEENT: Denies blurred vision, double vision, ear pain, eye pain, hearing loss, nose bleeds, sore throat Cardiac:  No dizziness, chest pain or heaviness, chest tightness,edema, No JVD Resp:   +cough, +sputum production, +shortness of breath,-wheezing, -hemoptysis,   Other:  All other systems  negative  BP (!) 142/66 (BP Location: Left Arm, Cuff Size: Large)   Pulse 84   Temp 97.9 F (36.6 C) (Temporal)   Ht '5\' 6"'$  (1.676 m)   Wt 179 lb 12.8 oz (81.6 kg)   SpO2 94%   BMI 29.02 kg/m    Physical Examination:   General Appearance: No distress  EYES PERRLA, EOM intact.   NECK Supple, No JVD Pulmonary: normal breath sounds, No wheezing.  CardiovascularNormal S1,S2.  No m/r/g.    ALL OTHER ROS ARE NEGATIVE          IMAGING    I have Independently reviewed images of  CT chest 10/10/18 Interpretation: minimal b/l lower lobe predominant peripheral  ILD consider NSIP mild bronchiectasis No significant changes since 2018     I  have Independently reviewed images of  CT chest 11/2016   Interpretation:minimal b/l lower lobe predominant peripheral  ILD consider NSIP mild bronchiectasis      ASSESSMENT/PLAN   81 year old pleasant white female seen today for follow-up chronic productive cough with chronic bronchitis in the setting of chronic allergic rhinitis with reflux disease in the setting of chronic bronchiectasis with 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 1 puff in p.m. and to assess her respiratory status    Chronic bronchiectasis  Encouraged to use flutter valve 10-15 times per day  Encourage exercise as tolerated     No indication for steroids at this time No indication for antibiotics at this time No indication for bronchoscopy at this time  No imaging needed at this time     MEDICATION ADJUSTMENTS/LABS AND TESTS ORDERED: TRY Advair HFA 1 puff in a.m. and 1 puff in p.m. Rinse mouth and brush her teeth after every use Monitor respiratory symptoms  Avoid sick contacts Exercise as tolerated  RECOMMEND FLUTTER usage more frequently  CURRENT  MEDICATIONS REVIEWED AT LENGTH WITH PATIENT TODAY    Follow up 1 year  Total time spent 23 minutes  Saba Neuman Patty Shelton, M.D.  [facility]  Medical Director [place] Director [facility] Cardio-P[facility] Department

## 2021-11-23 ENCOUNTER — Ambulatory Visit
Admission: EM | Admit: 2021-11-23 | Discharge: 2021-11-23 | Disposition: A | Discharge status: Home / Self Care | Payer: Medicare Other | Attending: Emergency Medicine | Admitting: Emergency Medicine

## 2021-11-23 DIAGNOSIS — N939 Abnormal uterine and vaginal bleeding, unspecified: Secondary | ICD-10-CM

## 2021-11-23 DIAGNOSIS — I10 Essential (primary) hypertension: Secondary | ICD-10-CM | POA: Diagnosis not present

## 2021-11-23 DIAGNOSIS — R310 Gross hematuria: Secondary | ICD-10-CM | POA: Diagnosis not present

## 2021-11-23 LAB — POCT URINALYSIS DIP (MANUAL ENTRY)
Glucose, UA: NEGATIVE mg/dL
Ketones, POC UA: NEGATIVE mg/dL
Nitrite, UA: NEGATIVE
Protein Ur, POC: >=300 mg/dL — AB
Spec Grav, UA: 1.02 (ref 1.010–1.025)
Urobilinogen, UA: 0.2 E.U./dL
pH, UA: 7 (ref 5.0–8.0)

## 2021-11-23 MED ORDER — CEPHALEXIN 500 MG PO CAPS: 500.0000 mg | ORAL_CAPSULE | Freq: Two times a day (BID) | ORAL | 0 refills | Status: AC

## 2021-11-23 NOTE — ED Triage Notes (Signed)
Patient presents to Urgent Care with complaints of vaginal bleeding since this morning. She states bleeding has worsened through out the day.

## 2021-11-23 NOTE — Discharge Instructions (Addendum)
Take the antibiotic as directed.  The urine culture is pending.    Schedule an appointment with your primary care provider or gynecologist as soon as possible.    Go to the emergency department if you have worsening symptoms.   Your blood pressure is elevated today at 165/75.  Please have this rechecked by your primary care provider in 2-4 weeks.

## 2021-11-23 NOTE — ED Provider Notes (Signed)
[facility] Pal[facility]    CSN: [id number] Arrival date & time: 11/23/21  1425      History   Chief Complaint Chief Complaint  Patient presents with   Vaginal Bleeding    HPI Patty Shelton is a 81 y.o. female.  Patient presents with blood in urine and vaginal bleeding since this morning.  She states she urinated at 1000 this morning and noted gross hematuria.  She has continued to have hematuria several times today.  She has kept a pad in her underwear and has noted blood in the pad without urination.  She denies fever, chills, abdominal pain, dysuria, pelvic pain, or other symptoms.  Her medical history includes hypertension, CKD, incontinence.  She reports one previous episode of vaginal bleeding several years ago and had normal work up then.    The history is provided by the patient and medical records.    Past Medical History:  Diagnosis Date   Agatston CAC score, <100 05/04/2021   Coronary CTA : Coronary Calcium Score 27.  Minimal proximal (dominant) RCA calcified plaque (0-24%) with no plaque noted in the LAD and nondominant LCx.   Hyperlipidemia with target LDL less than 100 03/27/2009   Qualifier: Diagnosis of  By: Diona Browner MD, Amy     Hypertension     Patient Active Problem List   Diagnosis Date Noted   Thoracic aortic atherosclerosis (HCC)-seen on CT 08/15/2021   Oral candidiasis 07/07/2021   Asthma 07/07/2021   Trochanteric bursitis of right hip 04/23/2021   Acute gout involving toe of right foot 04/23/2021   Acute bilateral low back pain without sciatica 12/18/2020   Bronchiectasis without complication ([place]) 32/95/1884   ILD (interstitial lung disease) ([place]) 04/02/2020   Nail, injury by 02/21/2019   Capsulitis of metatarsophalangeal (MTP) joint of left foot 02/21/2019   Hav (hallux abducto valgus), left 02/21/2019   Coronary atherosclerosis 10/11/2018   Dysphagia 03/28/2017   GERD (gastroesophageal reflux disease) 03/28/2017   MRSA (methicillin resistant staph  aureus) culture positive 03/28/2017   Mixed incontinence 06/24/2016   CKD (chronic kidney disease) stage 3, GFR 30-59 ml/min ([place]) 05/22/2015   Advance directive discussed with patient 05/22/2015   DOE (dyspnea on exertion) 04/03/2014   Hearing loss 03/01/2013   Herpes genitalia 04/28/2011   Osteopenia 03/22/2011   Primary hypertension 02/11/2011   Hyperlipidemia with target LDL less than 100 03/27/2009   Allergic rhinitis 03/27/2009    Past Surgical History:  Procedure Laterality Date   CATARACT EXTRACTION     CT CTA CORONARY W/CA SCORE W/CM &/OR WO/CM  05/04/2021   Coronary Calcium Score 27.  Minimal proximal (dominant) RCA calcified plaque (0-24%) with no plaque noted in the LAD and nondominant LCx.  Normal PA size.mild aortic root and ascending aortic calcification-evaluation for dissection. (No significant extracardiac findings),   RETINAL DETACHMENT SURGERY     TRANSTHORACIC ECHOCARDIOGRAM  05/27/2021   [facility]: (Normal Study) EF 55 to 60%.  No R WMA.  GR 1 DD with mild LA dilation.  Normal RV size and function-normal RVP and RAP.  Normal valves.    OB History   No obstetric history on file.      Home Medications    Prior to Admission medications   Medication Sig Start Date End Date Taking? Authorizing Provider  cephALEXin (KEFLEX) 500 MG capsule Take 1 capsule (500 mg total) by mouth 2 (two) times daily for 5 days. 11/23/21 11/28/21 Yes Sharion Balloon, NP  acetaminophen (TYLENOL) 500 MG tablet Take  500 mg by mouth every 6 (six) hours as needed.    [provider]  Biotin 10 MG TABS Take 1 tablet by mouth daily.    [provider]  cetirizine (ZYRTEC) 10 MG tablet Take 10 mg by mouth daily.    [provider]  Cholecalciferol (VITAMIN D3) 50 MCG (2000 UT) TABS Take 1 tablet by mouth daily.    [provider]  CRANBERRY PO Take 1 capsule by mouth daily.    [provider]  diclofenac sodium (VOLTAREN) 1 % GEL Apply 4 g topically 4  (four) times daily as needed.    [provider]  famciclovir (FAMVIR) 250 MG tablet TAKE 1 TABLET DAILY 08/31/21   Bedsole, Amy E, MD  fluticasone (FLONASE) 50 MCG/ACT nasal spray Place 2 sprays into both nostrils as needed.     [provider]  fluticasone-salmeterol (ADVAIR HFA) 115-21 MCG/ACT inhaler Inhale 2 puffs into the lungs 2 (two) times daily as needed. 08/11/21   Cobb, Karie Schwalbe, NP  guaiFENesin (MUCINEX) 600 MG 12 hr tablet Take 600 mg by mouth 2 (two) times daily as needed.    [provider]  hydrochlorothiazide (HYDRODIURIL) 25 MG tablet Take 0.5 tablets (12.5 mg total) by mouth daily. 08/10/21   Bedsole, Amy E, MD  losartan (COZAAR) 100 MG tablet Take 1 tablet (100 mg total) by mouth daily. 03/16/21   Bedsole, Amy E, MD  Multiple Minerals-Vitamins (CAL-MAG-ZINC-D PO) Take 1 tablet by mouth daily.    [provider]  Respiratory Therapy Supplies (FLUTTER) DEVI 1 Device by Does not apply route as directed. 10/15/18   Flora Lipps, MD  rosuvastatin (CRESTOR) 10 MG tablet TAKE 1 TABLET DAILY 11/03/21   Bedsole, Amy E, MD  vitamin B-12 (CYANOCOBALAMIN) 1000 MCG tablet Take 1,000 mcg by mouth daily.    [provider]  vitamin E 180 MG (400 UNITS) capsule Take 400 Units by mouth daily.    [provider]    Family History Family History  Problem Relation Age of Onset   Heart attack Mother 72       Apparently she started having issues at 81, but not sure if she had a heart attack at that time   Coronary artery disease Mother 59   Stroke Father    Hyperlipidemia Father    Dementia Father     Social History Social History   Tobacco Use   Smoking status: Never   Smokeless tobacco: Never  Vaping Use   Vaping Use: Never used  Substance Use Topics   Alcohol use: No    Alcohol/week: 0.0 standard drinks of alcohol   Drug use: No     Allergies   Amlodipine besylate and Lipitor [atorvastatin]   Review of Systems Review of  Systems  Constitutional:  Negative for chills and fever.  Respiratory:  Negative for cough and shortness of breath.   Cardiovascular:  Negative for chest pain and palpitations.  Gastrointestinal:  Negative for abdominal pain, blood in stool, constipation, diarrhea, nausea and vomiting.  Genitourinary:  Positive for hematuria and vaginal bleeding. Negative for dysuria, flank pain and pelvic pain.  Skin:  Negative for rash and wound.  All other systems reviewed and are negative.    Physical Exam Triage Vital Signs ED Triage Vitals  Enc Vitals Group     BP      Pulse      Resp      Temp      Temp src  SpO2      Weight      Height      Head Circumference      Peak Flow      Pain Score      Pain Loc      Pain Edu?      Excl. in [place]?    No data found.  Updated Vital Signs BP 112/73   Pulse 81   Temp 97.9 F (36.6 C)   Resp 18   SpO2 96%   Visual Acuity Right Eye Distance:   Left Eye Distance:   Bilateral Distance:    Right Eye Near:   Left Eye Near:    Bilateral Near:     Physical Exam Vitals and nursing note reviewed.  Constitutional:      General: She is not in acute distress.    Appearance: Normal appearance. She is well-developed. She is not ill-appearing.  HENT:     Mouth/Throat:     Mouth: Mucous membranes are moist.  Cardiovascular:     Rate and Rhythm: Normal rate and regular rhythm.     Heart sounds: Normal heart sounds.  Pulmonary:     Effort: Pulmonary effort is normal. No respiratory distress.     Breath sounds: Normal breath sounds.  Abdominal:     General: Bowel sounds are normal.     Palpations: Abdomen is soft.     Tenderness: There is no abdominal tenderness. There is no right CVA tenderness, left CVA tenderness, guarding or rebound.  Genitourinary:    General: Normal vulva.     Vagina: No vaginal discharge.     Comments: Vulva has no wounds or ecchymosis. No blood or discharge in vagina.  Musculoskeletal:     Cervical back: Neck  supple.  Skin:    General: Skin is warm and dry.  Neurological:     Mental Status: She is alert.  Psychiatric:        Mood and Affect: Mood normal.        Behavior: Behavior normal.      UC Treatments / Results  Labs (all labs ordered are listed, but only abnormal results are displayed) Labs Reviewed  POCT URINALYSIS DIP (MANUAL ENTRY) - Abnormal; Notable for the following components:      Result Value   Color, UA red (*)    Clarity, UA cloudy (*)    Bilirubin, UA small (*)    Blood, UA large (*)    Protein Ur, POC >=300 (*)    Leukocytes, UA Large (3+) (*)    All other components within normal limits  URINE CULTURE    EKG   Radiology No results found.  Procedures Procedures (including critical care time)  Medications Ordered in UC Medications - No data to display  Initial Impression / Assessment and Plan / UC Course  I have reviewed the triage vital signs and the nursing notes.  Pertinent labs & imaging results that were available during my care of the patient were reviewed by me and considered in my medical decision making (see chart for details).   Gross hematuria, vaginal bleeding, elevated blood pressure with hypertension.  No vaginal bleeding noted upon exam today.  Urine culture pending.  Treating with Keflex.  Instructed her to follow-up with her PCP or gynecologist as soon as possible.  ED precautions discussed. Patient agrees to plan of care.      Final Clinical Impressions(s) / UC Diagnoses   Final diagnoses:  Gross hematuria  Vaginal  bleeding  Elevated blood pressure reading in office with diagnosis of hypertension     Discharge Instructions      Take the antibiotic as directed.  The urine culture is pending.    Schedule an appointment with your primary care provider or gynecologist as soon as possible.    Go to the emergency department if you have worsening symptoms.   Your blood pressure is elevated today at 165/75.  Please have this  rechecked by your primary care provider in 2-4 weeks.               ED Prescriptions     Medication Sig Dispense Auth. Provider   cephALEXin (KEFLEX) 500 MG capsule Take 1 capsule (500 mg total) by mouth 2 (two) times daily for 5 days. 10 capsule Sharion Balloon, NP      PDMP not reviewed this encounter.   Sharion Balloon, NP 11/23/21 1459

## 2021-11-25 LAB — URINE CULTURE: Culture: 70000 — AB

## 2021-11-26 ENCOUNTER — Ambulatory Visit (INDEPENDENT_AMBULATORY_CARE_PROVIDER_SITE_OTHER): Payer: Medicare Other | Admitting: Family Medicine

## 2021-11-26 ENCOUNTER — Encounter: Payer: Self-pay | Admitting: Family Medicine

## 2021-11-26 VITALS — BP 118/56 | HR 73 | Temp 97.9°F | Resp 16 | Ht 66.0 in | Wt 178.4 lb

## 2021-11-26 DIAGNOSIS — I25118 Atherosclerotic heart disease of native coronary artery with other forms of angina pectoris: Secondary | ICD-10-CM | POA: Diagnosis not present

## 2021-11-26 DIAGNOSIS — N3001 Acute cystitis with hematuria: Secondary | ICD-10-CM | POA: Diagnosis not present

## 2021-11-26 NOTE — Assessment & Plan Note (Signed)
Acute, given resolution after 1-2 doses of Keflex in setting of 70,000 units of E. coli on urine culture, the most likely cause of the blood in her urine and on her pad is bladder infection.  Additionally no blood was seen in the vaginal vault which makes postmenopausal bleeding from the endometrium significantly less likely.  She has been instructed that if her symptoms recur she is to return for vaginal exam, pelvic ultrasound and possible referral to gynecology.

## 2021-11-26 NOTE — Patient Instructions (Signed)
Push fluids. Complete antibiotics. Call if blood returns or new fever, abdominal pain etc.

## 2021-11-26 NOTE — Progress Notes (Signed)
Patient ID: Patty Shelton, female    DOB: [birth year], 81 y.o.   MRN: [id number]  This visit was conducted in person.  Blood pressure (!) 118/56, pulse 73, temperature 97.9 F (36.6 C), resp. rate 16, height '5\' 6"'$  (1.676 m), weight 178 lb 6 oz (80.9 kg), SpO2 95 %.   CC:  Chief Complaint  Patient presents with   Follow-up    Vagina bleeding, high blood pressure    Subjective:   HPI: Patty Shelton is a 81 y.o. female presenting on 11/26/2021 for Follow-up (Vagina bleeding, high blood pressure)  Reviewed urgent care note from 11/23/2021.  She was seen at that time with blood in your urine and vaginal bleeding.  No dysuria, no abd pain, no CVA tenderness, no fever, no frequenxy. UA was found to be positive for LEs and blood.  No blood seen in vagina. Urine culture returned with 70,000 units of E. coli. She was treated for UTI with Keflex  x 5 days.  She has had similar vaginal bleeding several years ago and had a normal work-up at that time including visit to GYN  BP Readings from Last 3 Encounters:  11/26/21 (!) 118/56  11/23/21 112/73  11/17/21 (!) 142/66    Today she reports no further blood in urine or toilet paper.  No abd pain, no urinary symptoms      Relevant past medical, surgical, family and social history reviewed and updated as indicated. Interim medical history since our last visit reviewed. Allergies and medications reviewed and updated. Outpatient Medications Prior to Visit  Medication Sig Dispense Refill   acetaminophen (TYLENOL) 500 MG tablet Take 500 mg by mouth every 6 (six) hours as needed.     Biotin 10 MG TABS Take 1 tablet by mouth daily.     cephALEXin (KEFLEX) 500 MG capsule Take 1 capsule (500 mg total) by mouth 2 (two) times daily for 5 days. 10 capsule 0   cetirizine (ZYRTEC) 10 MG tablet Take 10 mg by mouth daily.     Cholecalciferol (VITAMIN D3) 50 MCG (2000 UT) TABS Take 1 tablet by mouth daily.     CRANBERRY PO Take 1 capsule by mouth  daily.     diclofenac sodium (VOLTAREN) 1 % GEL Apply 4 g topically 4 (four) times daily as needed.     famciclovir (FAMVIR) 250 MG tablet TAKE 1 TABLET DAILY 90 tablet 3   fluticasone (FLONASE) 50 MCG/ACT nasal spray Place 2 sprays into both nostrils as needed.      fluticasone-salmeterol (ADVAIR HFA) 115-21 MCG/ACT inhaler Inhale 2 puffs into the lungs 2 (two) times daily as needed. 1 each 5   guaiFENesin (MUCINEX) 600 MG 12 hr tablet Take 600 mg by mouth 2 (two) times daily as needed.     hydrochlorothiazide (HYDRODIURIL) 25 MG tablet Take 0.5 tablets (12.5 mg total) by mouth daily. 90 tablet 3   losartan (COZAAR) 100 MG tablet Take 1 tablet (100 mg total) by mouth daily. 90 tablet 3   Multiple Minerals-Vitamins (CAL-MAG-ZINC-D PO) Take 1 tablet by mouth daily.     Respiratory Therapy Supplies (FLUTTER) DEVI 1 Device by Does not apply route as directed. 1 each 0   rosuvastatin (CRESTOR) 10 MG tablet TAKE 1 TABLET DAILY 90 tablet 3   vitamin B-12 (CYANOCOBALAMIN) 1000 MCG tablet Take 1,000 mcg by mouth daily.     vitamin E 180 MG (400 UNITS) capsule Take 400 Units by mouth daily.     No  facility-administered medications prior to visit.     Per HPI unless specifically indicated in ROS section below Review of Systems  Constitutional:  Negative for fatigue and fever.  HENT:  Negative for congestion.   Eyes:  Negative for pain.  Respiratory:  Negative for cough and shortness of breath.   Cardiovascular:  Negative for chest pain, palpitations and leg swelling.  Gastrointestinal:  Negative for abdominal pain.  Genitourinary:  Negative for dysuria and vaginal bleeding.  Musculoskeletal:  Negative for back pain.  Neurological:  Negative for syncope, light-headedness and headaches.  Psychiatric/Behavioral:  Negative for dysphoric mood.    Objective:  There were no vitals taken for this visit.  Wt Readings from Last 3 Encounters:  11/17/21 179 lb 12.8 oz (81.6 kg)  09/09/21 180 lb (81.6 kg)   08/12/21 178 lb (80.7 kg)      Physical Exam Constitutional:      General: She is not in acute distress.    Appearance: Normal appearance. She is well-developed. She is not ill-appearing or toxic-appearing.  HENT:     Head: Normocephalic.     Right Ear: Hearing, tympanic membrane, ear canal and external ear normal. Tympanic membrane is not erythematous, retracted or bulging.     Left Ear: Hearing, tympanic membrane, ear canal and external ear normal. Tympanic membrane is not erythematous, retracted or bulging.     Nose: No mucosal edema or rhinorrhea.     Right Sinus: No maxillary sinus tenderness or frontal sinus tenderness.     Left Sinus: No maxillary sinus tenderness or frontal sinus tenderness.     Mouth/Throat:     Pharynx: Uvula midline.  Eyes:     General: Lids are normal. Lids are everted, no foreign bodies appreciated.     Conjunctiva/sclera: Conjunctivae normal.     Pupils: Pupils are equal, round, and reactive to light.  Neck:     Thyroid: No thyroid mass or thyromegaly.     Vascular: No carotid bruit.     Trachea: Trachea normal.  Cardiovascular:     Rate and Rhythm: Normal rate and regular rhythm.     Pulses: Normal pulses.     Heart sounds: Normal heart sounds, S1 normal and S2 normal. No murmur heard.    No friction rub. No gallop.  Pulmonary:     Effort: Pulmonary effort is normal. No tachypnea or respiratory distress.     Breath sounds: Normal breath sounds. No decreased breath sounds, wheezing, rhonchi or rales.  Abdominal:     General: Bowel sounds are normal.     Palpations: Abdomen is soft.     Tenderness: There is no abdominal tenderness.  Musculoskeletal:     Cervical back: Normal range of motion and neck supple.  Skin:    General: Skin is warm and dry.     Findings: No rash.  Neurological:     Mental Status: She is alert.  Psychiatric:        Mood and Affect: Mood is not anxious or depressed.        Speech: Speech normal.        Behavior:  Behavior normal. Behavior is cooperative.        Thought Content: Thought content normal.        Judgment: Judgment normal.       Results for orders placed or performed during the hospital encounter of 11/23/21  Urine Culture   Specimen: Urine, Clean Catch  Result Value Ref Range   Specimen Description URINE,  CLEAN CATCH    Special Requests      NONE Performed at [facility], [place] [address] [place] [employer], [place] [address]    Culture 70,000 COLONIES/mL ESCHERICHIA COLI (A)    Report Status 11/25/2021 FINAL    Organism ID, Bacteria ESCHERICHIA COLI (A)       Susceptibility   Escherichia coli - MIC*    AMPICILLIN >=32 RESISTANT Resistant     CEFAZOLIN <=4 SENSITIVE Sensitive     CEFEPIME <=0.12 SENSITIVE Sensitive     CEFTRIAXONE <=0.25 SENSITIVE Sensitive     CIPROFLOXACIN <=0.25 SENSITIVE Sensitive     GENTAMICIN >=16 RESISTANT Resistant     IMIPENEM <=0.25 SENSITIVE Sensitive     NITROFURANTOIN <=16 SENSITIVE Sensitive     TRIMETH/SULFA >=320 RESISTANT Resistant     AMPICILLIN/SULBACTAM 16 INTERMEDIATE Intermediate     PIP/TAZO <=4 SENSITIVE Sensitive     * 70,000 COLONIES/mL ESCHERICHIA COLI  POCT urinalysis dipstick  Result Value Ref Range   Color, UA red (A) yellow   Clarity, UA cloudy (A) clear   Glucose, UA negative negative mg/dL   Bilirubin, UA small (A) negative   Ketones, POC UA negative negative mg/dL   Spec Grav, UA 1.020 1.010 - 1.025   Blood, UA large (A) negative   pH, UA 7.0 5.0 - 8.0   Protein Ur, POC >=300 (A) negative mg/dL   Urobilinogen, UA 0.2 0.2 or 1.0 E.U./dL   Nitrite, UA Negative Negative   Leukocytes, UA Large (3+) (A) Negative     COVID 19 screen:  No recent travel or known exposure to COVID19 The patient denies respiratory symptoms of COVID 19 at this time. The importance of social distancing was discussed today.   Assessment and Plan Problem List Items Addressed This Visit     Acute cystitis with hematuria - Primary    Acute,  given resolution after 1-2 doses of Keflex in setting of 70,000 units of E. coli on urine culture, the most likely cause of the blood in her urine and on her pad is bladder infection.  Additionally no blood was seen in the vaginal vault which makes postmenopausal bleeding from the endometrium significantly less likely.  She has been instructed that if her symptoms recur she is to return for vaginal exam, pelvic ultrasound and possible referral to gynecology.          Eliezer Lofts, MD

## 2022-01-07 DIAGNOSIS — H43813 Vitreous degeneration, bilateral: Secondary | ICD-10-CM | POA: Diagnosis not present

## 2022-01-07 DIAGNOSIS — H59813 Chorioretinal scars after surgery for detachment, bilateral: Secondary | ICD-10-CM | POA: Diagnosis not present

## 2022-01-07 DIAGNOSIS — H52223 Regular astigmatism, bilateral: Secondary | ICD-10-CM | POA: Diagnosis not present

## 2022-01-07 DIAGNOSIS — Z961 Presence of intraocular lens: Secondary | ICD-10-CM | POA: Diagnosis not present

## 2022-01-07 DIAGNOSIS — H40023 Open angle with borderline findings, high risk, bilateral: Secondary | ICD-10-CM | POA: Diagnosis not present

## 2022-01-17 ENCOUNTER — Encounter: Payer: Self-pay | Admitting: Family Medicine

## 2022-01-17 ENCOUNTER — Ambulatory Visit (INDEPENDENT_AMBULATORY_CARE_PROVIDER_SITE_OTHER)
Admission: RE | Admit: 2022-01-17 | Discharge: 2022-01-17 | Disposition: A | Discharge status: Home / Self Care | Payer: Medicare Other | Source: Ambulatory Visit | Attending: Family Medicine | Admitting: Family Medicine

## 2022-01-17 ENCOUNTER — Ambulatory Visit (INDEPENDENT_AMBULATORY_CARE_PROVIDER_SITE_OTHER): Payer: Medicare Other | Admitting: Family Medicine

## 2022-01-17 VITALS — BP 140/70 | HR 81 | Temp 98.3°F | Ht 66.0 in | Wt 180.4 lb

## 2022-01-17 DIAGNOSIS — I25118 Atherosclerotic heart disease of native coronary artery with other forms of angina pectoris: Secondary | ICD-10-CM | POA: Diagnosis not present

## 2022-01-17 DIAGNOSIS — M19072 Primary osteoarthritis, left ankle and foot: Secondary | ICD-10-CM | POA: Diagnosis not present

## 2022-01-17 DIAGNOSIS — M7732 Calcaneal spur, left foot: Secondary | ICD-10-CM | POA: Diagnosis not present

## 2022-01-17 DIAGNOSIS — M79672 Pain in left foot: Secondary | ICD-10-CM | POA: Diagnosis not present

## 2022-01-17 NOTE — Progress Notes (Unsigned)
    Patty Shelton T. Patty Wiechman, MD, [place] at [facility] [place], [address]  Phone: [phone]  FAX: [phone]  Patty Shelton - 81 y.o. female  MRN [id number]  Date of Birth: [birth year]  Date: 01/17/2022  PCP: Jinny Sanders, MD  Referral: Jinny Sanders, MD  Chief Complaint  Patient presents with   Foot Pain    Left   Knee Pain    Behind left knee   Subjective:   Patty Shelton is a 81 y.o. very pleasant female patient with Body mass index is 29.11 kg/m. who presents with the following:  Patient presents with left-sided knee pain as well as left-sided foot pain.  Went shoe shopping.  Injured her foot about five years ago, and she tripped again.  A few years ago, shoe stopped and she kept going.  Back then wore a boot for a few week.   Now most pain is in the L forefoot and lateral foot.  She did have a pain behind her L knee this week, too, now gone.  H/0 3rd and 4th MT fx on the L foot in 2019  Review of Systems is noted in the HPI, as appropriate  Objective:   BP (!) 140/70   Pulse 81   Temp 98.3 F (36.8 C) (Oral)   Ht '5\' 6"'$  (1.676 m)   Wt 180 lb 6 oz (81.8 kg)   SpO2 94%   BMI 29.11 kg/m   GEN: No acute distress; alert,appropriate. PULM: Breathing comfortably in no respiratory distress PSYCH: Normally interactive.   Laboratory and Imaging Data:  Assessment and Plan:   ***

## 2022-01-20 ENCOUNTER — Other Ambulatory Visit: Payer: Self-pay | Admitting: Nurse Practitioner

## 2022-01-20 DIAGNOSIS — J4531 Mild persistent asthma with (acute) exacerbation: Secondary | ICD-10-CM

## 2022-01-25 DIAGNOSIS — M25562 Pain in left knee: Secondary | ICD-10-CM | POA: Diagnosis not present

## 2022-01-25 DIAGNOSIS — M2242 Chondromalacia patellae, left knee: Secondary | ICD-10-CM | POA: Diagnosis not present

## 2022-01-31 DIAGNOSIS — M25562 Pain in left knee: Secondary | ICD-10-CM | POA: Diagnosis not present

## 2022-02-07 ENCOUNTER — Ambulatory Visit (INDEPENDENT_AMBULATORY_CARE_PROVIDER_SITE_OTHER): Payer: Medicare Other | Admitting: Family Medicine

## 2022-02-07 ENCOUNTER — Encounter: Payer: Self-pay | Admitting: Family Medicine

## 2022-02-07 VITALS — BP 140/74 | HR 78 | Temp 98.5°F | Ht 66.0 in | Wt 178.5 lb

## 2022-02-07 DIAGNOSIS — M25562 Pain in left knee: Secondary | ICD-10-CM | POA: Diagnosis not present

## 2022-02-07 DIAGNOSIS — I25118 Atherosclerotic heart disease of native coronary artery with other forms of angina pectoris: Secondary | ICD-10-CM | POA: Diagnosis not present

## 2022-02-07 DIAGNOSIS — M1712 Unilateral primary osteoarthritis, left knee: Secondary | ICD-10-CM

## 2022-02-07 MED ORDER — TRAMADOL HCL 50 MG PO TABS: 50.0000 mg | ORAL_TABLET | Freq: Three times a day (TID) | ORAL | 0 refills | Status: DC | PRN

## 2022-02-07 NOTE — Progress Notes (Signed)
Jaiveer Panas T. Rolin Schult, MD, [place] at [facility] [place], [address]  Phone: [phone]  FAX: [phone]  Patty Shelton - 81 y.o. female  MRN [id number]  Date of Birth: 15-Aug-1940  Date: 02/07/2022  PCP: Jinny Sanders, MD  Referral: Jinny Sanders, MD  Chief Complaint  Patient presents with   Follow-up    Left Knee Pain-Not resolving   Subjective:   Patty Shelton is a 81 y.o. very pleasant female patient with Body mass index is 28.81 kg/m. who presents with the following:  I saw the patient several weeks ago, at that point she was having some foot pain.  She presents today to follow-up and talk about some left-sided knee pain.  Sometimes it will feel ok, but at other times it feels terrible.   Then 2 weeks ago, went to [facility] and saw [employer].  Has been resting her knee then, and she took some prednisone.  Had been feeling a lot better.   - The following week, went to [facility] again, then they went back to [facility], and she had an intraarticular knee injection.   I do have Dr. Betsey Amen note for review, he felt like she had moderate degenerative joint disease of the left knee.  She continues to have some persistent pain, she is using crutches some in the office today.  She does have a compressive sleeve.  She is having some difficulty walking.  Review of Systems is noted in the HPI, as appropriate  Objective:   BP (!) 140/74   Pulse 78   Temp 98.5 F (36.9 C) (Oral)   Ht '5\' 6"'$  (1.676 m)   Wt 178 lb 8 oz (81 kg)   SpO2 94%   BMI 28.81 kg/m   GEN: No acute distress; alert,appropriate. PULM: Breathing comfortably in no respiratory distress PSYCH: Normally interactive.   Left knee: Full extension and flexion to 125.  No significant tenderness with manipulation of the patella or the patellar facets Stable to varus and valgus stress.  ACL and PCL are intact. Does have medial  joint line tenderness.  Some lateral joint line tenderness to a lesser degree. There is no effusion. She does have some pain with flexion pinch as well as McMurray's, but there are no mechanical symptoms.  Thompson testing is negative.  Laboratory and Imaging Data:  Assessment and Plan:     ICD-10-CM   1. Acute pain of left knee  M25.562 CANCELED: DG Knee 4 Views W/Patella Left    2. Primary osteoarthritis of left knee  M17.12      Acute on chronic knee osteoarthritis with exacerbation plus or minus degenerative meniscal tear.  I completely agree with the plan of care set forward by the 2 prior orthopedic surgeons that the patient was seen.  Think that continued expectant management is reasonable with some Tylenol, NSAIDs as needed, and I do agree that an intra-articular knee injection was a reasonable course of action.  For now, we will continue to ambulate as able, work on motion of the knee, and also placed her in a patellar J brace.  Tramadol as needed is also reasonable.  Medication Management during today's office visit: Meds ordered this encounter  Medications   traMADol (ULTRAM) 50 MG tablet    Sig: Take 1 tablet (50 mg total) by mouth every 8 (eight) hours as needed for moderate pain.    Dispense:  20  tablet    Refill:  0   There are no discontinued medications.  Orders placed today for conditions managed today: No orders of the defined types were placed in this encounter.   Disposition: No follow-ups on file.  [employer] One speech-to-text software was used for transcription in this dictation.  Possible transcriptional errors can occur using Editor, commissioning.   Signed,  Maud Deed. Gaberiel Youngblood, MD   Outpatient Encounter Medications as of 02/07/2022  Medication Sig   acetaminophen (TYLENOL) 500 MG tablet Take 500 mg by mouth every 6 (six) hours as needed.   ADVAIR HFA 115-21 MCG/ACT inhaler USE 2 INHALATIONS TWICE A DAY   Biotin 10 MG TABS Take 1 tablet by mouth  daily.   cetirizine (ZYRTEC) 10 MG tablet Take 10 mg by mouth daily.   Cholecalciferol (VITAMIN D3) 50 MCG (2000 UT) TABS Take 1 tablet by mouth daily.   CRANBERRY PO Take 1 capsule by mouth daily.   diclofenac sodium (VOLTAREN) 1 % GEL Apply 4 g topically 4 (four) times daily as needed.   famciclovir (FAMVIR) 250 MG tablet TAKE 1 TABLET DAILY   fluticasone (FLONASE) 50 MCG/ACT nasal spray Place 2 sprays into both nostrils as needed.    guaiFENesin (MUCINEX) 600 MG 12 hr tablet Take 600 mg by mouth 2 (two) times daily as needed.   hydrochlorothiazide (HYDRODIURIL) 25 MG tablet Take 0.5 tablets (12.5 mg total) by mouth daily.   losartan (COZAAR) 100 MG tablet Take 1 tablet (100 mg total) by mouth daily.   Multiple Minerals-Vitamins (CAL-MAG-ZINC-D PO) Take 1 tablet by mouth daily.   Respiratory Therapy Supplies (FLUTTER) DEVI 1 Device by Does not apply route as directed.   rosuvastatin (CRESTOR) 10 MG tablet TAKE 1 TABLET DAILY   traMADol (ULTRAM) 50 MG tablet Take 1 tablet (50 mg total) by mouth every 8 (eight) hours as needed for moderate pain.   vitamin B-12 (CYANOCOBALAMIN) 1000 MCG tablet Take 1,000 mcg by mouth daily.   vitamin E 180 MG (400 UNITS) capsule Take 400 Units by mouth daily.   No facility-administered encounter medications on file as of 02/07/2022.

## 2022-02-10 ENCOUNTER — Ambulatory Visit: Payer: Medicare Other

## 2022-03-17 DIAGNOSIS — H1045 Other chronic allergic conjunctivitis: Secondary | ICD-10-CM | POA: Diagnosis not present

## 2022-04-20 ENCOUNTER — Telehealth: Payer: Self-pay | Admitting: Family Medicine

## 2022-04-20 ENCOUNTER — Other Ambulatory Visit: Payer: Self-pay | Admitting: Family Medicine

## 2022-04-20 NOTE — Telephone Encounter (Signed)
Refill sent as requested. 

## 2022-04-20 NOTE — Telephone Encounter (Signed)
Spoke to pt, scheduled cpe for January 2024

## 2022-04-20 NOTE — Telephone Encounter (Signed)
Caller Name: Latha Call back phone #: [phone]  MEDICATION(S):  losartan (COZAAR) 100 MG tablet     Days of Med Remaining: 10  Has the patient contacted their pharmacy (YES/NO)? YES What did pharmacy advise?  Pharmacy stated new prescription was needed   Preferred Pharmacy:  [facility]   ~~~Please advise patient/caregiver to allow 2-3 business days to process RX refills.

## 2022-04-20 NOTE — Telephone Encounter (Signed)
Please call and schedule CPE with fasting labs prior with Dr. Bedsole. 

## 2022-04-26 ENCOUNTER — Ambulatory Visit: Payer: Medicare Other | Admitting: Family Medicine

## 2022-05-30 ENCOUNTER — Telehealth: Payer: Self-pay | Admitting: Family Medicine

## 2022-05-30 DIAGNOSIS — M109 Gout, unspecified: Secondary | ICD-10-CM

## 2022-05-30 DIAGNOSIS — E7800 Pure hypercholesterolemia, unspecified: Secondary | ICD-10-CM

## 2022-05-30 NOTE — Telephone Encounter (Signed)
-----   Message from Velna Hatchet, RT sent at 05/23/2022  3:10 PM EST ----- Regarding: Thu 12/28 lab Patient is scheduled for cpx, please order future labs.  Thanks, Anda Kraft

## 2022-06-01 ENCOUNTER — Ambulatory Visit: Payer: Medicare Other | Admitting: Dermatology

## 2022-06-09 ENCOUNTER — Other Ambulatory Visit (INDEPENDENT_AMBULATORY_CARE_PROVIDER_SITE_OTHER): Payer: Medicare Other

## 2022-06-09 DIAGNOSIS — M109 Gout, unspecified: Secondary | ICD-10-CM | POA: Diagnosis not present

## 2022-06-09 DIAGNOSIS — E7800 Pure hypercholesterolemia, unspecified: Secondary | ICD-10-CM

## 2022-06-09 LAB — COMPREHENSIVE METABOLIC PANEL
ALT: 16 U/L (ref 0–35)
AST: 17 U/L (ref 0–37)
Albumin: 4.3 g/dL (ref 3.5–5.2)
Alkaline Phosphatase: 59 U/L (ref 39–117)
BUN: 20 mg/dL (ref 6–23)
CO2: 30 mEq/L (ref 19–32)
Calcium: 9.2 mg/dL (ref 8.4–10.5)
Chloride: 100 mEq/L (ref 96–112)
Creatinine, Ser: 1.17 mg/dL (ref 0.40–1.20)
GFR: 43.6 mL/min — ABNORMAL LOW (ref >60.00)
Glucose, Bld: 103 mg/dL — ABNORMAL HIGH (ref 70–99)
Potassium: 4.6 mEq/L (ref 3.5–5.1)
Sodium: 136 mEq/L (ref 135–145)
Total Bilirubin: 0.4 mg/dL (ref 0.2–1.2)
Total Protein: 6.8 g/dL (ref 6.0–8.3)

## 2022-06-09 LAB — LIPID PANEL
Cholesterol: 162 mg/dL (ref 0–200)
HDL: 59 mg/dL (ref >39.00)
LDL Cholesterol: 89 mg/dL (ref 0–99)
NonHDL: 102.83
Total CHOL/HDL Ratio: 3
Triglycerides: 70 mg/dL (ref 0.0–149.0)
VLDL: 14 mg/dL (ref 0.0–40.0)

## 2022-06-09 LAB — URIC ACID: Uric Acid, Serum: 6.7 mg/dL (ref 2.4–7.0)

## 2022-06-14 NOTE — Progress Notes (Signed)
No critical labs need to be addressed urgently. We will discuss labs in detail at upcoming office visit.   

## 2022-06-16 ENCOUNTER — Ambulatory Visit (INDEPENDENT_AMBULATORY_CARE_PROVIDER_SITE_OTHER): Payer: Medicare Other | Admitting: Family Medicine

## 2022-06-16 ENCOUNTER — Encounter: Payer: Self-pay | Admitting: Family Medicine

## 2022-06-16 ENCOUNTER — Other Ambulatory Visit: Payer: Self-pay | Admitting: Family Medicine

## 2022-06-16 VITALS — BP 132/68 | HR 69 | Temp 96.9°F | Resp 16 | Ht 66.0 in | Wt 178.5 lb

## 2022-06-16 DIAGNOSIS — K219 Gastro-esophageal reflux disease without esophagitis: Secondary | ICD-10-CM

## 2022-06-16 DIAGNOSIS — J4531 Mild persistent asthma with (acute) exacerbation: Secondary | ICD-10-CM | POA: Diagnosis not present

## 2022-06-16 DIAGNOSIS — N1832 Chronic kidney disease, stage 3b: Secondary | ICD-10-CM

## 2022-06-16 DIAGNOSIS — I700 Atherosclerosis of aorta: Secondary | ICD-10-CM

## 2022-06-16 DIAGNOSIS — J849 Interstitial pulmonary disease, unspecified: Secondary | ICD-10-CM

## 2022-06-16 DIAGNOSIS — I10 Essential (primary) hypertension: Secondary | ICD-10-CM

## 2022-06-16 DIAGNOSIS — N6489 Other specified disorders of breast: Secondary | ICD-10-CM

## 2022-06-16 DIAGNOSIS — J479 Bronchiectasis, uncomplicated: Secondary | ICD-10-CM

## 2022-06-16 DIAGNOSIS — E785 Hyperlipidemia, unspecified: Secondary | ICD-10-CM | POA: Diagnosis not present

## 2022-06-16 NOTE — Assessment & Plan Note (Signed)
Chronic, GFR 43 on recent labs.

## 2022-06-16 NOTE — Assessment & Plan Note (Signed)
Chronic, followed by pulmonary Advair 115 2 puffs twice daily  albuterol inhaler as needed Flutter valve therapy 2-3 times daily

## 2022-06-16 NOTE — Patient Instructions (Addendum)
Work on low cholesterol diet.,. decrease fried foods, fatty food, animal fats etc. Start prilosec 20 mg OTC daily for 2-4 weeks, increase to 2 tabs daily after 2 weeks if not improving.  Call if no better after 4 weeks.  Decrease acidic foods, chocolate etc. Please call the location of your choice from the menu below to schedule your Mammogram and/or Bone Density appointment.    [place] at [facility]   Phone:  [phone]   [place], [place] [address]                                            Services: 3D Mammogram and Success  [place] at [facility])  Phone:  [phone]   [address] [place], [place] [address]                                              Services:  3D Mammogram and Bone Density

## 2022-06-16 NOTE — Assessment & Plan Note (Signed)
Chronic, well-controlled  HCTZ 12.5 mg daily Losartan 100 mg daily

## 2022-06-16 NOTE — Assessment & Plan Note (Signed)
Chronic, followed by pulmonary. 

## 2022-06-16 NOTE — Assessment & Plan Note (Signed)
Medical management with statin

## 2022-06-16 NOTE — Assessment & Plan Note (Addendum)
Chronic, LDL almost at goal  < 70 on Crestor 10 mg daily. She would like to continue working on lifestyle change.. reassess in 6 months and consider increase of crestor yo 20 mg daily

## 2022-06-16 NOTE — Assessment & Plan Note (Signed)
Acute,   avoid triggers.  Start trial of prilosec.

## 2022-06-16 NOTE — Progress Notes (Signed)
Patient ID: Patty Shelton, female    DOB: 02/08/1941, 82 y.o.   MRN: [id number]  This visit was conducted in person.  BP 132/68   Pulse 69   Temp (!) 96.9 F (36.1 C) (Temporal)   Resp 16   Ht '5\' 6"'$  (1.676 m)   Wt 178 lb 8 oz (81 kg)   SpO2 97%   BMI 28.81 kg/m    CC:  Chief Complaint  Patient presents with   Annual Exam    Subjective:   HPI: Patty Shelton is a 82 y.o. female presenting on 06/16/2022 for  review of chronic health problems. He/She also has the following acute concerns today:  GERD ongoing for 2 months.. no diet change.  Heart burn and belching.  The patient saw a LPN or RN for medicare wellness visit. 08/2021  Prevention and wellness was reviewed in detail. Note reviewed and important notes copied below.  Hypertension:   Well controlled  on current meds.  BP Readings from Last 3 Encounters:  06/16/22 132/68  02/07/22 (!) 140/74  01/17/22 (!) 140/70  Using medication without problems or lightheadedness:  none Edema:none  No chest pain Short of breath:none Average home BPs: Other issues:  Elevated Cholesterol: HX of CAD, goal LDL < 70 Lab Results  Component Value Date   CHOL 162 06/09/2022   HDL 59.00 06/09/2022   LDLCALC 89 06/09/2022   LDLDIRECT 180.6 04/04/2011   TRIG 70.0 06/09/2022   CHOLHDL 3 06/09/2022  Using medications without problems: Muscle aches:  Diet compliance: good, room for improvement. Exercise:  Other complaints:  CKD  Interstitial lung disease/ bronchiectasis: Followed by pulmonary.  Last seen 1 year ago.  Gout , no flares in the last year. Lab Results  Component Value Date   LABURIC 6.7 06/09/2022        Relevant past medical, surgical, family and social history reviewed and updated as indicated. Interim medical history since our last visit reviewed. Allergies and medications reviewed and updated. Outpatient Medications Prior to Visit  Medication Sig Dispense Refill   acetaminophen (TYLENOL) 500 MG tablet  Take 500 mg by mouth every 6 (six) hours as needed.     ADVAIR HFA 115-21 MCG/ACT inhaler USE 2 INHALATIONS TWICE A DAY 12 g 5   Biotin 10 MG TABS Take 1 tablet by mouth daily.     cetirizine (ZYRTEC) 10 MG tablet Take 10 mg by mouth daily.     Cholecalciferol (VITAMIN D3) 50 MCG (2000 UT) TABS Take 1 tablet by mouth daily.     CRANBERRY PO Take 1 capsule by mouth daily.     diclofenac sodium (VOLTAREN) 1 % GEL Apply 4 g topically 4 (four) times daily as needed.     famciclovir (FAMVIR) 250 MG tablet TAKE 1 TABLET DAILY 90 tablet 3   fluticasone (FLONASE) 50 MCG/ACT nasal spray Place 2 sprays into both nostrils as needed.      guaiFENesin (MUCINEX) 600 MG 12 hr tablet Take 600 mg by mouth 2 (two) times daily as needed.     hydrochlorothiazide (HYDRODIURIL) 25 MG tablet Take 0.5 tablets (12.5 mg total) by mouth daily. 90 tablet 3   losartan (COZAAR) 100 MG tablet TAKE 1 TABLET DAILY 90 tablet 0   Multiple Minerals-Vitamins (CAL-MAG-ZINC-D PO) Take 1 tablet by mouth daily.     Respiratory Therapy Supplies (FLUTTER) DEVI 1 Device by Does not apply route as directed. 1 each 0   rosuvastatin (CRESTOR) 10 MG tablet TAKE  1 TABLET DAILY 90 tablet 3   traMADol (ULTRAM) 50 MG tablet Take 1 tablet (50 mg total) by mouth every 8 (eight) hours as needed for moderate pain. 20 tablet 0   vitamin B-12 (CYANOCOBALAMIN) 1000 MCG tablet Take 1,000 mcg by mouth daily.     vitamin E 180 MG (400 UNITS) capsule Take 400 Units by mouth daily.     No facility-administered medications prior to visit.     Per HPI unless specifically indicated in ROS section below Review of Systems  Constitutional:  Negative for fatigue and fever.  HENT:  Negative for congestion.   Eyes:  Negative for pain.  Respiratory:  Negative for cough and shortness of breath.   Cardiovascular:  Negative for chest pain, palpitations and leg swelling.  Gastrointestinal:  Negative for abdominal pain and blood in stool.       GERD   Genitourinary:  Negative for dysuria and vaginal bleeding.  Musculoskeletal:  Negative for back pain.  Neurological:  Negative for syncope, light-headedness and headaches.  Psychiatric/Behavioral:  Negative for dysphoric mood.    Objective:  BP 132/68   Pulse 69   Temp (!) 96.9 F (36.1 C) (Temporal)   Resp 16   Ht '5\' 6"'$  (1.676 m)   Wt 178 lb 8 oz (81 kg)   SpO2 97%   BMI 28.81 kg/m   Wt Readings from Last 3 Encounters:  06/16/22 178 lb 8 oz (81 kg)  02/07/22 178 lb 8 oz (81 kg)  01/17/22 180 lb 6 oz (81.8 kg)      Physical Exam Constitutional:      General: She is not in acute distress.    Appearance: Normal appearance. She is well-developed. She is not ill-appearing or toxic-appearing.  HENT:     Head: Normocephalic.     Right Ear: Hearing, tympanic membrane, ear canal and external ear normal. Tympanic membrane is not erythematous, retracted or bulging.     Left Ear: Hearing, tympanic membrane, ear canal and external ear normal. Tympanic membrane is not erythematous, retracted or bulging.     Nose: No mucosal edema or rhinorrhea.     Right Sinus: No maxillary sinus tenderness or frontal sinus tenderness.     Left Sinus: No maxillary sinus tenderness or frontal sinus tenderness.     Mouth/Throat:     Pharynx: Uvula midline.  Eyes:     General: Lids are normal. Lids are everted, no foreign bodies appreciated.     Conjunctiva/sclera: Conjunctivae normal.     Pupils: Pupils are equal, round, and reactive to light.  Neck:     Thyroid: No thyroid mass or thyromegaly.     Vascular: No carotid bruit.     Trachea: Trachea normal.  Cardiovascular:     Rate and Rhythm: Normal rate and regular rhythm.     Pulses: Normal pulses.     Heart sounds: Normal heart sounds, S1 normal and S2 normal. No murmur heard.    No friction rub. No gallop.  Pulmonary:     Effort: Pulmonary effort is normal. No tachypnea or respiratory distress.     Breath sounds: Normal breath sounds. No  decreased breath sounds, wheezing, rhonchi or rales.  Abdominal:     General: Bowel sounds are normal.     Palpations: Abdomen is soft.     Tenderness: There is no abdominal tenderness.  Musculoskeletal:     Cervical back: Normal range of motion and neck supple.  Skin:    General: Skin is  warm and dry.     Findings: No rash.  Neurological:     Mental Status: She is alert.  Psychiatric:        Mood and Affect: Mood is not anxious or depressed.        Speech: Speech normal.        Behavior: Behavior normal. Behavior is cooperative.        Thought Content: Thought content normal.        Judgment: Judgment normal.       Results for orders placed or performed in visit on 06/09/22  Uric acid  Result Value Ref Range   Uric Acid, Serum 6.7 2.4 - 7.0 mg/dL  Comprehensive metabolic panel  Result Value Ref Range   Sodium 136 135 - 145 mEq/L   Potassium 4.6 3.5 - 5.1 mEq/L   Chloride 100 96 - 112 mEq/L   CO2 30 19 - 32 mEq/L   Glucose, Bld 103 (H) 70 - 99 mg/dL   BUN 20 6 - 23 mg/dL   Creatinine, Ser 1.17 0.40 - 1.20 mg/dL   Total Bilirubin 0.4 0.2 - 1.2 mg/dL   Alkaline Phosphatase 59 39 - 117 U/L   AST 17 0 - 37 U/L   ALT 16 0 - 35 U/L   Total Protein 6.8 6.0 - 8.3 g/dL   Albumin 4.3 3.5 - 5.2 g/dL   GFR 43.60 (L) >60.00 mL/min   Calcium 9.2 8.4 - 10.5 mg/dL  Lipid panel  Result Value Ref Range   Cholesterol 162 0 - 200 mg/dL   Triglycerides 70.0 0.0 - 149.0 mg/dL   HDL 59.00 >39.00 mg/dL   VLDL 14.0 0.0 - 40.0 mg/dL   LDL Cholesterol 89 0 - 99 mg/dL   Total CHOL/HDL Ratio 3    NonHDL 102.83      COVID 19 screen:  No recent travel or known exposure to COVID19 The patient denies respiratory symptoms of COVID 19 at this time. The importance of social distancing was discussed today.   Assessment and Plan   The patient's preventative maintenance and recommended screening tests for an annual wellness exam were reviewed in full today. Brought up to date unless services  declined.  Counselled on the importance of diet, exercise, and its role in overall health and mortality. The patient's FH and SH was reviewed, including their home life, tobacco status, and drug and alcohol status.   Vaccines: Consider COVID, shingles, tetanus RSV at pharmacy.  Refused flu vaccine. Pap/DVE: not indicated  Mammo: 09/08/2020 abnormal recommended diagnostic and ultrasound... Does not look like patient followed through. 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      Hyperlipidemia with target LDL less than 100 (Chronic)    Chronic, LDL almost at goal  < 70 on Crestor 10 mg daily. She would like to continue working on lifestyle change.. reassess in 6 months and consider increase of crestor yo 20 mg daily      Primary hypertension - Primary (Chronic)  Chronic, well-controlled  HCTZ 12.5 mg daily Losartan 100 mg daily      Thoracic aortic atherosclerosis (HCC)-seen on CT (Chronic)    Medical management with statin      Bronchiectasis without complication ([place])    Followed by pulmonary.  Treated with flutter valve and steroid inhaler.      GERD (gastroesophageal reflux disease)     Acute,   avoid triggers.  Start trial of prilosec.      ILD (interstitial lung disease) (HCC)    Chronic, followed by pulmonary           Patty Lofts, MD

## 2022-06-16 NOTE — Assessment & Plan Note (Signed)
Followed by pulmonary.  Treated with flutter valve and steroid inhaler.

## 2022-06-21 ENCOUNTER — Other Ambulatory Visit: Payer: Medicare Other

## 2022-06-22 ENCOUNTER — Ambulatory Visit (INDEPENDENT_AMBULATORY_CARE_PROVIDER_SITE_OTHER): Payer: Medicare Other | Admitting: Dermatology

## 2022-06-22 VITALS — BP 183/71

## 2022-06-22 DIAGNOSIS — L820 Inflamed seborrheic keratosis: Secondary | ICD-10-CM | POA: Diagnosis not present

## 2022-06-22 DIAGNOSIS — L814 Other melanin hyperpigmentation: Secondary | ICD-10-CM

## 2022-06-22 DIAGNOSIS — L821 Other seborrheic keratosis: Secondary | ICD-10-CM | POA: Diagnosis not present

## 2022-06-22 DIAGNOSIS — I8390 Asymptomatic varicose veins of unspecified lower extremity: Secondary | ICD-10-CM | POA: Diagnosis not present

## 2022-06-22 NOTE — Progress Notes (Signed)
   Follow-Up Visit   Subjective  Patty Shelton is a 82 y.o. female who presents for the following: Growths (Bil legs, present for several months. ).  Itchy and Irritating.   The following portions of the chart were reviewed this encounter and updated as appropriate:       Review of Systems:  No other skin or systemic complaints except as noted in HPI or Assessment and Plan.  Objective  Well appearing patient in no apparent distress; mood and affect are within normal limits.  A focused examination was performed including legs. Relevant physical exam findings are noted in the Assessment and Plan.  R calf x 2, R popliteal x 1, R med knee x 1, L med thigh x 3 (7) White, scaly, verrucous papules    Assessment & Plan  Varicose Veins/Spider Veins - Dilated blue, purple or red veins at the lower extremities - Reassured - Smaller vessels can be treated by sclerotherapy (a procedure to inject a medicine into the veins to make them disappear) if desired, but the treatment is not covered by insurance. Larger vessels may be covered if symptomatic and we would refer to vascular surgeon if treatment desired.  Seborrheic Keratoses - Stuck-on, waxy, tan-brown papules and/or plaques  - Benign-appearing - Discussed benign etiology and prognosis. - Observe - Call for any changes  Lentigines - Scattered tan macules - Due to sun exposure - Benign-appearing, observe - Recommend daily broad spectrum sunscreen SPF 30+ to sun-exposed areas, reapply every 2 hours as needed. - Call for any changes  Inflamed seborrheic keratosis (7) R calf x 2, R popliteal x 1, R med knee x 1, L med thigh x 3  Vs Warts  Symptomatic, irritating, patient would like treated.  Destruction of lesion - R calf x 2, R popliteal x 1, R med knee x 1, L med thigh x 3  Destruction method: cryotherapy   Informed consent: discussed and consent obtained   Lesion destroyed using liquid nitrogen: Yes   Region frozen until  ice ball extended beyond lesion: Yes   Outcome: patient tolerated procedure well with no complications   Post-procedure details: wound care instructions given   Additional details:  Prior to procedure, discussed risks of blister formation, small wound, skin dyspigmentation, or rare scar following cryotherapy. Recommend Vaseline ointment to treated areas while healing.    Return if symptoms worsen or fail to improve.  IJamesetta Orleans, CMA, am acting as scribe for Brendolyn Patty, MD .  Documentation: I have reviewed the above documentation for accuracy and completeness, and I agree with the above.  Brendolyn Patty MD

## 2022-06-22 NOTE — Patient Instructions (Addendum)

## 2022-06-28 ENCOUNTER — Encounter: Payer: Medicare Other | Admitting: Family Medicine

## 2022-07-08 ENCOUNTER — Ambulatory Visit
Admission: RE | Admit: 2022-07-08 | Discharge: 2022-07-08 | Disposition: A | Discharge status: Home / Self Care | Payer: Medicare Other | Source: Ambulatory Visit | Attending: Family Medicine | Admitting: Family Medicine

## 2022-07-08 DIAGNOSIS — N6489 Other specified disorders of breast: Secondary | ICD-10-CM | POA: Diagnosis not present

## 2022-07-08 DIAGNOSIS — R922 Inconclusive mammogram: Secondary | ICD-10-CM | POA: Diagnosis not present

## 2022-07-15 ENCOUNTER — Other Ambulatory Visit: Payer: Self-pay | Admitting: Family Medicine

## 2022-07-20 ENCOUNTER — Telehealth: Payer: Self-pay | Admitting: Family Medicine

## 2022-07-20 NOTE — Telephone Encounter (Signed)
LVM for pt to rtn my call to schedule AWV with [facility] call back # [phone] 

## 2022-08-26 ENCOUNTER — Other Ambulatory Visit: Payer: Self-pay | Admitting: Family Medicine

## 2022-08-26 NOTE — Telephone Encounter (Signed)
Last office visit 06/16/2022 for CPE.  Last refilled 08/31/2021 for #90 with 3 refills.  Next Appt: No future appointments.

## 2022-08-30 ENCOUNTER — Ambulatory Visit: Payer: Medicare Other

## 2022-09-14 ENCOUNTER — Telehealth: Payer: Self-pay | Admitting: Family Medicine

## 2022-09-14 NOTE — Telephone Encounter (Signed)
Contacted Patty Shelton to schedule their annual wellness visit. Patient declined to schedule AWV at this time. Will call back to schedule.   [place] Direct Dial: [phone]

## 2022-09-26 ENCOUNTER — Other Ambulatory Visit: Payer: Self-pay | Admitting: Family Medicine

## 2022-10-11 ENCOUNTER — Ambulatory Visit (INDEPENDENT_AMBULATORY_CARE_PROVIDER_SITE_OTHER): Payer: Medicare Other

## 2022-10-11 VITALS — BP 128/76 | HR 76 | Ht 66.5 in | Wt 180.6 lb

## 2022-10-11 DIAGNOSIS — Z0000 Encounter for general adult medical examination without abnormal findings: Secondary | ICD-10-CM

## 2022-10-11 NOTE — Patient Instructions (Signed)
Patty Shelton , Thank you for taking time to come for your Medicare Wellness Visit. I appreciate your ongoing commitment to your health goals. Please review the following plan we discussed and let me know if I can assist you in the future.   These are the goals we discussed:  Goals      Follow up with Primary Care Provider     Starting 07/26/2018, I will continue to take medications as prescribed and to keep appointments with PCP as scheduled.      Patient Stated     08/02/2019, I will maintain and continue medications as prescribed.      Patient Stated     08/11/2020, I will maintain and continue medications as prescribed.      Patient Stated     Would like to maintain current routine     Patient Stated     No new goals.        This is a list of the screening recommended for you and due dates:  Health Maintenance  Topic Date Due   COVID-19 Vaccine (1) Never done   Zoster (Shingles) Vaccine (1 of 2) Never done   DTaP/Tdap/Td vaccine (2 - Td or Tdap) 03/10/2021   Flu Shot  01/12/2023   Mammogram  07/09/2023   Medicare Annual Wellness Visit  10/11/2023   DEXA scan (bone density measurement)  09/03/2024   Pneumonia Vaccine  Completed   HPV Vaccine  Aged Out    Advanced directives: unknown  Conditions/risks identified: Aim for 30 minutes of exercise or brisk walking, 6-8 glasses of water, and 5 servings of fruits and vegetables each day.   Next appointment: Follow up in one year for your annual wellness visit 10/12/23 @ 11:15 am televisit   Preventive Care 65 Years and Older, Female Preventive care refers to lifestyle choices and visits with your health care provider that can promote health and wellness. What does preventive care include? A yearly physical exam. This is also called an annual well check. Dental exams once or twice a year. Routine eye exams. Ask your health care provider how often you should have your eyes checked. Personal lifestyle choices, including: Daily  care of your teeth and gums. Regular physical activity. Eating a healthy diet. Avoiding tobacco and drug use. Limiting alcohol use. Practicing safe sex. Taking low-dose aspirin every day. Taking vitamin and mineral supplements as recommended by your health care provider. What happens during an annual well check? The services and screenings done by your health care provider during your annual well check will depend on your age, overall health, lifestyle risk factors, and family history of disease. Counseling  Your health care provider may ask you questions about your: Alcohol use. Tobacco use. Drug use. Emotional well-being. Home and relationship well-being. Sexual activity. Eating habits. History of falls. Memory and ability to understand (cognition). Work and work Astronomer. Reproductive health. Screening  You may have the following tests or measurements: Height, weight, and BMI. Blood pressure. Lipid and cholesterol levels. These may be checked every 5 years, or more frequently if you are over 69 years old. Skin check. Lung cancer screening. You may have this screening every year starting at age 46 if you have a 30-pack-year history of smoking and currently smoke or have quit within the past 15 years. Fecal occult blood test (FOBT) of the stool. You may have this test every year starting at age 10. Flexible sigmoidoscopy or colonoscopy. You may have a sigmoidoscopy every 5 years or  a colonoscopy every 10 years starting at age 5. Hepatitis C blood test. Hepatitis B blood test. Sexually transmitted disease (STD) testing. Diabetes screening. This is done by checking your blood sugar (glucose) after you have not eaten for a while (fasting). You may have this done every 1-3 years. Bone density scan. This is done to screen for osteoporosis. You may have this done starting at age 21. Mammogram. This may be done every 1-2 years. Talk to your health care provider about how often you  should have regular mammograms. Talk with your health care provider about your test results, treatment options, and if necessary, the need for more tests. Vaccines  Your health care provider may recommend certain vaccines, such as: Influenza vaccine. This is recommended every year. Tetanus, diphtheria, and acellular pertussis (Tdap, Td) vaccine. You may need a Td booster every 10 years. Zoster vaccine. You may need this after age 28. Pneumococcal 13-valent conjugate (PCV13) vaccine. One dose is recommended after age 40. Pneumococcal polysaccharide (PPSV23) vaccine. One dose is recommended after age 75. Talk to your health care provider about which screenings and vaccines you need and how often you need them. This information is not intended to replace advice given to you by your health care provider. Make sure you discuss any questions you have with your health care provider. Document Released: 06/26/2015 Document Revised: 02/17/2016 Document Reviewed: 03/31/2015 Elsevier Interactive Patient Education  2017 [place] Prevention in the Home Falls can cause injuries. They can happen to people of all ages. There are many things you can do to make your home safe and to help prevent falls. What can I do on the outside of my home? Regularly fix the edges of walkways and driveways and fix any cracks. Remove anything that might make you trip as you walk through a door, such as a raised step or threshold. Trim any bushes or trees on the path to your home. Use bright outdoor lighting. Clear any walking paths of anything that might make someone trip, such as rocks or tools. Regularly check to see if handrails are loose or broken. Make sure that both sides of any steps have handrails. Any raised decks and porches should have guardrails on the edges. Have any leaves, snow, or ice cleared regularly. Use sand or salt on walking paths during winter. Clean up any spills in your garage right away.  This includes oil or grease spills. What can I do in the bathroom? Use night lights. Install grab bars by the toilet and in the tub and shower. Do not use towel bars as grab bars. Use non-skid mats or decals in the tub or shower. If you need to sit down in the shower, use a plastic, non-slip stool. Keep the floor dry. Clean up any water that spills on the floor as soon as it happens. Remove soap buildup in the tub or shower regularly. Attach bath mats securely with double-sided non-slip rug tape. Do not have throw rugs and other things on the floor that can make you trip. What can I do in the bedroom? Use night lights. Make sure that you have a light by your bed that is easy to reach. Do not use any sheets or blankets that are too big for your bed. They should not hang down onto the floor. Have a firm chair that has side arms. You can use this for support while you get dressed. Do not have throw rugs and other things on the floor that  can make you trip. What can I do in the kitchen? Clean up any spills right away. Avoid walking on wet floors. Keep items that you use a lot in easy-to-reach places. If you need to reach something above you, use a strong step stool that has a grab bar. Keep electrical cords out of the way. Do not use floor polish or wax that makes floors slippery. If you must use wax, use non-skid floor wax. Do not have throw rugs and other things on the floor that can make you trip. What can I do with my stairs? Do not leave any items on the stairs. Make sure that there are handrails on both sides of the stairs and use them. Fix handrails that are broken or loose. Make sure that handrails are as long as the stairways. Check any carpeting to make sure that it is firmly attached to the stairs. Fix any carpet that is loose or worn. Avoid having throw rugs at the top or bottom of the stairs. If you do have throw rugs, attach them to the floor with carpet tape. Make sure that  you have a light switch at the top of the stairs and the bottom of the stairs. If you do not have them, ask someone to add them for you. What else can I do to help prevent falls? Wear shoes that: Do not have high heels. Have rubber bottoms. Are comfortable and fit you well. Are closed at the toe. Do not wear sandals. If you use a stepladder: Make sure that it is fully opened. Do not climb a closed stepladder. Make sure that both sides of the stepladder are locked into place. Ask someone to hold it for you, if possible. Clearly mark and make sure that you can see: Any grab bars or handrails. First and last steps. Where the edge of each step is. Use tools that help you move around (mobility aids) if they are needed. These include: Canes. Walkers. Scooters. Crutches. Turn on the lights when you go into a dark area. Replace any light bulbs as soon as they burn out. Set up your furniture so you have a clear path. Avoid moving your furniture around. If any of your floors are uneven, fix them. If there are any pets around you, be aware of where they are. Review your medicines with your doctor. Some medicines can make you feel dizzy. This can increase your chance of falling. Ask your doctor what other things that you can do to help prevent falls. This information is not intended to replace advice given to you by your health care provider. Make sure you discuss any questions you have with your health care provider. Document Released: 03/26/2009 Document Revised: 11/05/2015 Document Reviewed: 07/04/2014 Elsevier Interactive Patient Education  2017 [employer].

## 2022-10-11 NOTE — Progress Notes (Signed)
Subjective:   Patty Shelton is a 82 y.o. female who presents for Medicare Annual (Subsequent) preventive examination.  Review of Systems    Cardiac Risk Factors include: advanced age (>83men, >48 women);hypertension;sedentary lifestyle     Objective:    Today's Vitals   10/11/22 0843  BP: 128/76  Pulse: 76  SpO2: 96%  Weight: 180 lb 9.6 oz (81.9 kg)  Height: 5' 6.5" (1.689 m)   Body mass index is 28.71 kg/m.     08/12/2021    1:23 PM 08/11/2020    1:23 PM 08/02/2019   11:05 AM 07/26/2018    9:45 AM 07/18/2017   10:37 AM 11/27/2016    7:59 AM 05/19/2016   11:46 AM  Advanced Directives  Does Patient Have a Medical Advance Directive? No No No No No No No  Does patient want to make changes to medical advance directive? Yes (MAU/Ambulatory/Procedural Areas - Information given)        Would patient like information on creating a medical advance directive?  No - Patient declined No - Patient declined No - Patient declined No - Patient declined Yes (ED - Information included in AVS)     Current Medications (verified) Outpatient Encounter Medications as of 10/11/2022  Medication Sig   acetaminophen (TYLENOL) 500 MG tablet Take 500 mg by mouth every 6 (six) hours as needed.   ADVAIR HFA 115-21 MCG/ACT inhaler USE 2 INHALATIONS TWICE A DAY   Biotin 10 MG TABS Take 1 tablet by mouth daily.   cetirizine (ZYRTEC) 10 MG tablet Take 10 mg by mouth daily.   Cholecalciferol (VITAMIN D3) 50 MCG (2000 UT) TABS Take 1 tablet by mouth daily.   CRANBERRY PO Take 1 capsule by mouth daily.   diclofenac sodium (VOLTAREN) 1 % GEL Apply 4 g topically 4 (four) times daily as needed.   famciclovir (FAMVIR) 250 MG tablet TAKE 1 TABLET DAILY   fluticasone (FLONASE) 50 MCG/ACT nasal spray Place 2 sprays into both nostrils as needed.    guaiFENesin (MUCINEX) 600 MG 12 hr tablet Take 600 mg by mouth 2 (two) times daily as needed.   hydrochlorothiazide (HYDRODIURIL) 25 MG tablet TAKE ONE-HALF (1/2) TABLET DAILY    losartan (COZAAR) 100 MG tablet TAKE 1 TABLET DAILY   Multiple Minerals-Vitamins (CAL-MAG-ZINC-D PO) Take 1 tablet by mouth daily.   Respiratory Therapy Supplies (FLUTTER) DEVI 1 Device by Does not apply route as directed.   rosuvastatin (CRESTOR) 10 MG tablet TAKE 1 TABLET DAILY   vitamin B-12 (CYANOCOBALAMIN) 1000 MCG tablet Take 1,000 mcg by mouth daily.   vitamin E 180 MG (400 UNITS) capsule Take 400 Units by mouth daily.   traMADol (ULTRAM) 50 MG tablet Take 1 tablet (50 mg total) by mouth every 8 (eight) hours as needed for moderate pain. (Patient not taking: Reported on 10/11/2022)   No facility-administered encounter medications on file as of 10/11/2022.    Allergies (verified) Amlodipine besylate and Lipitor [atorvastatin]   History: Past Medical History:  Diagnosis Date   Agatston CAC score, <100 05/04/2021   Coronary CTA : Coronary Calcium Score 27.  Minimal proximal (dominant) RCA calcified plaque (0-24%) with no plaque noted in the LAD and nondominant LCx.   Hyperlipidemia with target LDL less than 100 03/27/2009   Qualifier: Diagnosis of  By: Ermalene Searing MD, Amy     Hypertension    Past Surgical History:  Procedure Laterality Date   CATARACT EXTRACTION     CT CTA CORONARY W/CA SCORE W/CM &/  OR WO/CM  05/04/2021   Coronary Calcium Score 27.  Minimal proximal (dominant) RCA calcified plaque (0-24%) with no plaque noted in the LAD and nondominant LCx.  Normal PA size.mild aortic root and ascending aortic calcification-evaluation for dissection. (No significant extracardiac findings),   RETINAL DETACHMENT SURGERY     TRANSTHORACIC ECHOCARDIOGRAM  05/27/2021   [facility]: (Normal Study) EF 55 to 60%.  No R WMA.  GR 1 DD with mild LA dilation.  Normal RV size and function-normal RVP and RAP.  Normal valves.   Family History  Problem Relation Age of Onset   Heart attack Mother 59       Apparently she started having issues at 14, but not sure if she had a heart attack at that time    Coronary artery disease Mother 5   Stroke Father    Hyperlipidemia Father    Dementia Father    Social History   Socioeconomic History   Marital status: Married    Spouse name: Not on file   Number of children: Not on file   Years of education: Not on file   Highest education level: Not on file  Occupational History   Not on file  Tobacco Use   Smoking status: Never   Smokeless tobacco: Never  Vaping Use   Vaping Use: Never used  Substance and Sexual Activity   Alcohol use: No    Alcohol/week: 0.0 standard drinks of alcohol   Drug use: No   Sexual activity: Yes  Other Topics Concern   Not on file  Social History Narrative   No living will , no HCPOA. Full code (reviewed 2015, given packet)   Social Determinants of Health   Financial Resource Strain: Low Risk  (10/11/2022)   Overall Financial Resource Strain (CARDIA)    Difficulty of Paying Living Expenses: Not hard at all  Food Insecurity: No Food Insecurity (10/11/2022)   Hunger Vital Sign    Worried About Running Out of Food in the Last Year: Never true    Ran Out of Food in the Last Year: Never true  Transportation Needs: No Transportation Needs (10/11/2022)   PRAPARE - Administrator, Civil Service (Medical): No    Lack of Transportation (Non-Medical): No  Physical Activity: Inactive (10/11/2022)   Exercise Vital Sign    Days of Exercise per Week: 0 days    Minutes of Exercise per Session: 0 min  Stress: No Stress Concern Present (10/11/2022)   [employer] of Occupational Health - Occupational Stress Questionnaire    Feeling of Stress : Not at all  Social Connections: Socially Integrated (10/11/2022)   Social Connection and Isolation Panel [NHANES]    Frequency of Communication with Friends and Family: Three times a week    Frequency of Social Gatherings with Friends and Family: More than three times a week    Attends Religious Services: More than 4 times per year    Active Member of [employer] or  Organizations: Yes    Attends Engineer, structural: More than 4 times per year    Marital Status: Married    Tobacco Counseling Counseling given: Not Answered   Clinical Intake:  Pre-visit preparation completed: Yes  Pain : No/denies pain     Nutritional Risks: None Diabetes: No  How often do you need to have someone help you when you read instructions, pamphlets, or other written materials from your doctor or pharmacy?: 1 - Never  Diabetic?No  Interpreter Needed?: No  Comments: live at home with husband Information entered by :: Dorene Grebe CMA   Activities of Daily Living    10/11/2022    8:58 AM 10/11/2022    8:34 AM  In your present state of health, do you have any difficulty performing the following activities:  Hearing? 0 0  Vision? 0 0  Difficulty concentrating or making decisions? 1 0  Comment occaionally forgets   Walking or climbing stairs? 0 1  Dressing or bathing? 0 0  Doing errands, shopping? 0 0  Preparing Food and eating ? N N  Using the Toilet? N N  In the past six months, have you accidently leaked urine? Y Y  Comment leaky bladder urine incontinence  Do you have problems with loss of bowel control? N N  Managing your Medications? N N  Managing your Finances? N N  Housekeeping or managing your Housekeeping? N N    Patient Care Team: Excell Seltzer, MD as PCP - General Hannah Beat, MD as Consulting Physician (Sports Medicine) Deirdre Evener, MD as Consulting Physician (Dermatology) Nelson Chimes, MD as Consulting Physician (Ophthalmology) Driscilla Moats., MD as Consulting Physician (Dentistry) Jason Coop Lyn Records, FNP as Nurse Practitioner (Family Medicine)  Indicate any recent Medical Services you may have received from other than [facility] providers in the past year (date may be approximate).     Assessment:   This is a routine wellness examination for [facility].  Hearing/Vision screen Hearing Screening - Comments:: Adequate  hearing Vision Screening - Comments:: Wears readers- Dr. Hazle Quant  Dietary issues and exercise activities discussed: Current Exercise Habits: The patient does not participate in regular exercise at present, Exercise limited by: None identified   Goals Addressed             This Visit's Progress    Patient Stated       No new goals.       Depression Screen    10/11/2022    8:29 AM 08/12/2021    1:28 PM 08/11/2020    1:26 PM 08/02/2019   11:07 AM 07/26/2018    9:45 AM 07/18/2017   10:37 AM 05/19/2016   11:46 AM  PHQ 2/9 Scores  PHQ - 2 Score 0 0 0 0 0 0 0  PHQ- 9 Score   0 0 0 0     Fall Risk    10/11/2022    8:33 AM 08/12/2021    1:25 PM 08/11/2020    1:25 PM 08/02/2019   11:06 AM 07/26/2018    9:45 AM  Fall Risk   Falls in the past year? 0 0 1 0 1  Comment     fell; multiple fractures to left foot  Number falls in past yr: 0 0 0 0 0  Injury with Fall? 0 0 0 0 1  Risk for fall due to : No Fall Risks No Fall Risks Medication side effect Medication side effect   Follow up Falls prevention discussed Falls prevention discussed Falls evaluation completed;Falls prevention discussed Falls evaluation completed;Falls prevention discussed     FALL RISK PREVENTION PERTAINING TO THE HOME:  Any stairs in or around the home? Yes  If so, are there any without handrails? Yes  Home free of loose throw rugs in walkways, pet beds, electrical cords, etc? Yes  Adequate lighting in your home to reduce risk of falls? Yes   ASSISTIVE DEVICES UTILIZED TO PREVENT FALLS:  Life alert? No  Use of a cane, walker or w/c? No  Grab bars in the bathroom? No  Shower chair or bench in shower? Yes  Elevated toilet seat or a handicapped toilet? Yes   TIMED UP AND GO:  Was the test performed? Yes .  Length of time to ambulate 10 feet: 8 sec.   Gait steady and fast with assistive device  Cognitive Function:    08/11/2020    1:30 PM 08/02/2019   11:09 AM 07/26/2018    9:45 AM 07/18/2017   10:55 AM  05/19/2016   11:55 AM  MMSE - Mini Mental State Exam  Orientation to time 5 5 5 5 5   Orientation to Place 5 5 5 5 5   Registration 3 3 3 3 3   Attention/ Calculation 5 5 0 0 0  Recall 3 3 3 3 3   Language- name 2 objects   0 0 0  Language- repeat 1 1 1 1 1   Language- follow 3 step command   3 3 3   Language- read & follow direction   0 0 0  Write a sentence   0 0 0  Copy design   0 0 0  Total score   20 20 20         10/11/2022    8:42 AM  6CIT Screen  What Year? 0 points  What month? 0 points  What time? 0 points  Count back from 20 0 points  Months in reverse 0 points  Repeat phrase 0 points  Total Score 0 points    Immunizations Immunization History  Administered Date(s) Administered   Fluad Quad(high Dose 65+) 03/14/2019, 04/02/2020   Influenza Split 05/24/2011, 03/01/2013, 04/24/2015   Influenza Whole 03/27/2009   Influenza, High Dose Seasonal PF 02/29/2016, 04/07/2017, 05/24/2018   Influenza,inj,Quad PF,6+ Mos 04/03/2014   Pneumococcal Conjugate-13 04/03/2014, 04/07/2017   Pneumococcal Polysaccharide-23 03/27/2009   Tdap 03/11/2011   Zoster, Live 06/02/2014    TDAP status: Due, Education has been provided regarding the importance of this vaccine. Advised may receive this vaccine at local pharmacy or Health Dept. Aware to provide a copy of the vaccination record if obtained from local pharmacy or Health Dept. Verbalized acceptance and understanding.  Flu Vaccine status: Declined, Education has been provided regarding the importance of this vaccine but patient still declined. Advised may receive this vaccine at local pharmacy or Health Dept. Aware to provide a copy of the vaccination record if obtained from local pharmacy or Health Dept. Verbalized acceptance and understanding.  Pneumococcal vaccine status: Declined,  Education has been provided regarding the importance of this vaccine but patient still declined. Advised may receive this vaccine at local pharmacy or  Health Dept. Aware to provide a copy of the vaccination record if obtained from local pharmacy or Health Dept. Verbalized acceptance and understanding.   Covid-19 vaccine status: Declined, Education has been provided regarding the importance of this vaccine but patient still declined. Advised may receive this vaccine at local pharmacy or Health Dept.or vaccine clinic. Aware to provide a copy of the vaccination record if obtained from local pharmacy or Health Dept. Verbalized acceptance and understanding.  Qualifies for Shingles Vaccine? No   Zostavax completed No   Shingrix Completed?: No.    Education has been provided regarding the importance of this vaccine. Patient has been advised to call insurance company to determine out of pocket expense if they have not yet received this vaccine. Advised may also receive vaccine at local pharmacy or Health Dept. Verbalized acceptance and understanding.  Screening Tests Health Maintenance  Topic  Date Due   COVID-19 Vaccine (1) Never done   Zoster Vaccines- Shingrix (1 of 2) Never done   DTaP/Tdap/Td (2 - Td or Tdap) 03/10/2021   INFLUENZA VACCINE  01/12/2023   MAMMOGRAM  07/09/2023   Medicare Annual Wellness (AWV)  10/11/2023   DEXA SCAN  09/03/2024   Pneumonia Vaccine 17+ Years old  Completed   HPV VACCINES  Aged Out    Health Maintenance  Health Maintenance Due  Topic Date Due   COVID-19 Vaccine (1) Never done   Zoster Vaccines- Shingrix (1 of 2) Never done   DTaP/Tdap/Td (2 - Td or Tdap) 03/10/2021    Colorectal cancer screening: No longer required.   Mammogram status: Completed yes. Repeat every year  Bone Density status: Completed yes. Results reflect: Bone density results: OSTEOPENIA. Repeat every 2 years.  Lung Cancer Screening: (Low Dose CT Chest recommended if Age 66-80 years, 30 pack-year currently smoking OR have quit w/in 15years.) does not qualify.   Lung Cancer Screening Referral: Additional Screening: Hepatitis C  Screening: does not qualify; Completed   Vision Screening: Recommended annual ophthalmology exams for early detection of glaucoma and other disorders of the eye. Is the patient up to date with their annual eye exam?  No  Who is the provider or what is the name of the office in which the patient attends annual eye exams? Dr. Hazle Quant If pt is not established with a provider, would they like to be referred to a provider to establish care? No .   Dental Screening: Recommended annual dental exams for proper oral hygiene  Community Resource Referral / Chronic Care Management: CRR required this visit?  No   CCM required this visit?  No      Plan:     I have personally reviewed and noted the following in the patient's chart:   Medical and social history Use of alcohol, tobacco or illicit drugs  Current medications and supplements including opioid prescriptions. Patient is not currently taking opioid prescriptions. Functional ability and status Nutritional status Physical activity Advanced directives List of other physicians Hospitalizations, surgeries, and ER visits in previous 12 months Vitals Screenings to include cognitive, depression, and falls Referrals and appointments  In addition, I have reviewed and discussed with patient certain preventive protocols, quality metrics, and best practice recommendations. A written personalized care plan for preventive services as well as general preventive health recommendations were provided to patient.     Maryan Puls, LPN   1/61/0960   Nurse Notes: Patient voiced no issues or concerns Vaccinations: declines all Influenza vaccine: recommend every Fall Pneumococcal vaccine: recommend once per lifetime Prevnar-20 Tdap vaccine: recommend every 10 years Shingles vaccine: recommend Shingrix which is 2 doses 2-6 months apart and over 90% effective     Covid-19: recommend 2 doses one month apart with a booster 6 months later

## 2022-11-09 ENCOUNTER — Encounter: Payer: Self-pay | Admitting: Dermatology

## 2022-11-09 ENCOUNTER — Ambulatory Visit (INDEPENDENT_AMBULATORY_CARE_PROVIDER_SITE_OTHER): Payer: Medicare Other | Admitting: Dermatology

## 2022-11-09 VITALS — BP 166/85 | HR 77

## 2022-11-09 DIAGNOSIS — I781 Nevus, non-neoplastic: Secondary | ICD-10-CM

## 2022-11-09 DIAGNOSIS — L570 Actinic keratosis: Secondary | ICD-10-CM | POA: Diagnosis not present

## 2022-11-09 DIAGNOSIS — L820 Inflamed seborrheic keratosis: Secondary | ICD-10-CM

## 2022-11-09 DIAGNOSIS — D225 Melanocytic nevi of trunk: Secondary | ICD-10-CM | POA: Diagnosis not present

## 2022-11-09 DIAGNOSIS — X32XXXA Exposure to sunlight, initial encounter: Secondary | ICD-10-CM | POA: Diagnosis not present

## 2022-11-09 DIAGNOSIS — Z1283 Encounter for screening for malignant neoplasm of skin: Secondary | ICD-10-CM | POA: Diagnosis not present

## 2022-11-09 DIAGNOSIS — L578 Other skin changes due to chronic exposure to nonionizing radiation: Secondary | ICD-10-CM | POA: Diagnosis not present

## 2022-11-09 DIAGNOSIS — L821 Other seborrheic keratosis: Secondary | ICD-10-CM

## 2022-11-09 DIAGNOSIS — I8393 Asymptomatic varicose veins of bilateral lower extremities: Secondary | ICD-10-CM | POA: Diagnosis not present

## 2022-11-09 DIAGNOSIS — W908XXA Exposure to other nonionizing radiation, initial encounter: Secondary | ICD-10-CM | POA: Diagnosis not present

## 2022-11-09 DIAGNOSIS — D229 Melanocytic nevi, unspecified: Secondary | ICD-10-CM

## 2022-11-09 DIAGNOSIS — L814 Other melanin hyperpigmentation: Secondary | ICD-10-CM

## 2022-11-09 NOTE — Patient Instructions (Addendum)
Cryotherapy Aftercare  Wash gently with soap and water everyday.   Apply Vaseline Jelly daily until healed.    Recommend daily broad spectrum sunscreen SPF 30+ to sun-exposed areas, reapply every 2 hours as needed. Call for new or changing lesions.  Staying in the shade or wearing long sleeves, sun glasses (UVA+UVB protection) and wide brim hats (4-inch brim around the entire circumference of the hat) are also recommended for sun protection.    Melanoma ABCDEs  Melanoma is the most dangerous type of skin cancer, and is the leading cause of death from skin disease.  You are more likely to develop melanoma if you: Have light-colored skin, light-colored eyes, or red or blond hair Spend a lot of time in the sun Tan regularly, either outdoors or in a tanning bed Have had blistering sunburns, especially during childhood Have a close family member who has had a melanoma Have atypical moles or large birthmarks  Early detection of melanoma is key since treatment is typically straightforward and cure rates are extremely high if we catch it early.   The first sign of melanoma is often a change in a mole or a new dark spot.  The ABCDE system is a way of remembering the signs of melanoma.  A for asymmetry:  The two halves do not match. B for border:  The edges of the growth are irregular. C for color:  A mixture of colors are present instead of an even brown color. D for diameter:  Melanomas are usually (but not always) greater than 6mm - the size of a pencil eraser. E for evolution:  The spot keeps changing in size, shape, and color.  Please check your skin once per month between visits. You can use a small mirror in front and a large mirror behind you to keep an eye on the back side or your body.   If you see any new or changing lesions before your next follow-up, please call to schedule a visit.  Please continue daily skin protection including broad spectrum sunscreen SPF 30+ to sun-exposed  areas, reapplying every 2 hours as needed when you're outdoors.   Staying in the shade or wearing long sleeves, sun glasses (UVA+UVB protection) and wide brim hats (4-inch brim around the entire circumference of the hat) are also recommended for sun protection.    Due to recent changes in healthcare laws, you may see results of your pathology and/or laboratory studies on MyChart before the doctors have had a chance to review them. We understand that in some cases there may be results that are confusing or concerning to you. Please understand that not all results are received at the same time and often the doctors may need to interpret multiple results in order to provide you with the best plan of care or course of treatment. Therefore, we ask that you please give us 2 business days to thoroughly review all your results before contacting the office for clarification. Should we see a critical lab result, you will be contacted sooner.   If You Need Anything After Your Visit  If you have any questions or concerns for your doctor, please call our main line at [phone] and press option 4 to reach your doctor's medical assistant. If no one answers, please leave a voicemail as directed and we will return your call as soon as possible. Messages left after 4 pm will be answered the following business day.   You may also send us a message via MyChart.   We typically respond to MyChart messages within 1-2 business days.  For prescription refills, please ask your pharmacy to contact our office. Our fax number is [phone].  If you have an urgent issue when the clinic is closed that cannot wait until the next business day, you can page your doctor at the number below.    Please note that while we do our best to be available for urgent issues outside of office hours, we are not available 24/7.   If you have an urgent issue and are unable to reach us, you may choose to seek medical care at your doctor's  office, retail clinic, urgent care center, or emergency room.  If you have a medical emergency, please immediately call 911 or go to the emergency department.  Pager Numbers  - Dr. Kowalski: [phone]  - Dr. Moye: [phone]  - Dr. Stewart: [phone]  In the event of inclement weather, please call our main line at [phone] for an update on the status of any delays or closures.  Dermatology Medication Tips: Please keep the boxes that topical medications come in in order to help keep track of the instructions about where and how to use these. Pharmacies typically print the medication instructions only on the boxes and not directly on the medication tubes.   If your medication is too expensive, please contact our office at [phone] option 4 or send us a message through MyChart.   We are unable to tell what your co-pay for medications will be in advance as this is different depending on your insurance coverage. However, we may be able to find a substitute medication at lower cost or fill out paperwork to get insurance to cover a needed medication.   If a prior authorization is required to get your medication covered by your insurance company, please allow us 1-2 business days to complete this process.  Drug prices often vary depending on where the prescription is filled and some pharmacies may offer cheaper prices.  The website www.goodrx.com contains coupons for medications through different pharmacies. The prices here do not account for what the cost may be with help from insurance (it may be cheaper with your insurance), but the website can give you the price if you did not use any insurance.  - You can print the associated coupon and take it with your prescription to the pharmacy.  - You may also stop by our office during regular business hours and pick up a GoodRx coupon card.  - If you need your prescription sent electronically to a different pharmacy, notify our office  through [facility] MyChart or by phone at [phone] option 4.     Si Usted Necesita Algo Despus de Su Visita  Tambin puede enviarnos un mensaje a travs de MyChart. Por lo general respondemos a los mensajes de MyChart en el transcurso de 1 a 2 das hbiles.  Para renovar recetas, por favor pida a su farmacia que se ponga en contacto con nuestra oficina. Nuestro nmero de fax es el [phone].  Si tiene un asunto urgente cuando la clnica est cerrada y que no puede esperar hasta el siguiente da hbil, puede llamar/localizar a su doctor(a) al nmero que aparece a continuacin.   Por favor, tenga en cuenta que aunque hacemos todo lo posible para estar disponibles para asuntos urgentes fuera del horario de oficina, no estamos disponibles las 24 horas del da, los 7 das de la semana.   Si tiene un problema urgente y no puede   comunicarse con nosotros, puede optar por buscar atencin mdica  en el consultorio de su doctor(a), en una clnica privada, en un centro de atencin urgente o en una sala de emergencias.  Si tiene una emergencia mdica, por favor llame inmediatamente al 911 o vaya a la sala de emergencias.  Nmeros de bper  - Dr. Kowalski: [phone]  - Dra. Moye: [phone]  - Dra. Stewart: [phone]  En caso de inclemencias del tiempo, por favor llame a nuestra lnea principal al [phone] para una actualizacin sobre el estado de cualquier retraso o cierre.  Consejos para la medicacin en dermatologa: Por favor, guarde las cajas en las que vienen los medicamentos de uso tpico para ayudarle a seguir las instrucciones sobre dnde y cmo usarlos. Las farmacias generalmente imprimen las instrucciones del medicamento slo en las cajas y no directamente en los tubos del medicamento.   Si su medicamento es muy caro, por favor, pngase en contacto con nuestra oficina llamando al [phone] y presione la opcin 4 o envenos un mensaje a travs de MyChart.   No  podemos decirle cul ser su copago por los medicamentos por adelantado ya que esto es diferente dependiendo de la cobertura de su seguro. Sin embargo, es posible que podamos encontrar un medicamento sustituto a menor costo o llenar un formulario para que el seguro cubra el medicamento que se considera necesario.   Si se requiere una autorizacin previa para que su compaa de seguros cubra su medicamento, por favor permtanos de 1 a 2 das hbiles para completar este proceso.  Los precios de los medicamentos varan con frecuencia dependiendo del lugar de dnde se surte la receta y alguna farmacias pueden ofrecer precios ms baratos.  El sitio web www.goodrx.com tiene cupones para medicamentos de diferentes farmacias. Los precios aqu no tienen en cuenta lo que podra costar con la ayuda del seguro (puede ser ms barato con su seguro), pero el sitio web puede darle el precio si no utiliz ningn seguro.  - Puede imprimir el cupn correspondiente y llevarlo con su receta a la farmacia.  - Tambin puede pasar por nuestra oficina durante el horario de atencin regular y recoger una tarjeta de cupones de GoodRx.  - Si necesita que su receta se enve electrnicamente a una farmacia diferente, informe a nuestra oficina a travs de MyChart de [facility] o por telfono llamando al [phone] y presione la opcin 4.  

## 2022-11-09 NOTE — Progress Notes (Signed)
Follow-Up Visit   Subjective  Patty Shelton is a 82 y.o. female who presents for the following: Skin Cancer Screening and Full Body Skin Exam. No personal Hx of skin cancer or dysplastic nevi. She has a few spots to check that get irritated and itchy.  The patient presents for Total-Body Skin Exam (TBSE) for skin cancer screening and mole check. The patient has spots, moles and lesions to be evaluated, some may be new or changing and the patient has concerns that these could be cancer.    The following portions of the chart were reviewed this encounter and updated as appropriate: medications, allergies, medical history  Review of Systems:  No other skin or systemic complaints except as noted in HPI or Assessment and Plan.  Objective  Well appearing patient in no apparent distress; mood and affect are within normal limits.  A full examination was performed including scalp, head, eyes, ears, nose, lips, neck, chest, axillae, abdomen, back, buttocks, bilateral upper extremities, bilateral lower extremities, hands, feet, fingers, toes, fingernails, and toenails. All findings within normal limits unless otherwise noted below.   Relevant physical exam findings are noted in the Assessment and Plan.  Right Mid Back x2, left flexor wrist x1, right upper calf x1 (4) Erythematous keratotic or waxy stuck-on papule or plaque.  Right Upper Sternum x1 Erythematous thin papules/macules with gritty scale.     Assessment & Plan   LENTIGINES, SEBORRHEIC KERATOSES, HEMANGIOMAS - Benign normal skin lesions - Benign-appearing - Call for any changes  MELANOCYTIC NEVI - Tan-brown and/or pink-flesh-colored symmetric macules and papules - Benign appearing on exam today - Observation - Call clinic for new or changing moles - Recommend daily use of broad spectrum spf 30+ sunscreen to sun-exposed areas.  NEVUS Exam: flesh papule at left lower back.  Treatment Plan: Benign appearing on exam today.  Recommend observation. Call clinic for new or changing moles. Recommend daily use of broad spectrum spf 30+ sunscreen to sun-exposed areas  ACTINIC DAMAGE - Chronic condition, secondary to cumulative UV/sun exposure - diffuse scaly erythematous macules with underlying dyspigmentation - Recommend daily broad spectrum sunscreen SPF 30+ to sun-exposed areas, reapply every 2 hours as needed.  - Staying in the shade or wearing long sleeves, sun glasses (UVA+UVB protection) and wide brim hats (4-inch brim around the entire circumference of the hat) are also recommended for sun protection.  - Call for new or changing lesions.  SKIN CANCER SCREENING PERFORMED TODAY.  TELANGIECTASIA Exam: tiny pink macule at left inferior nasal tip  Treatment Plan: Benign appearing on exam Call for changes   Varicose Veins/Spider Veins - Dilated blue, purple or red veins at the lower extremities - Reassured - Smaller vessels can be treated by sclerotherapy (a procedure to inject a medicine into the veins to make them disappear) if desired, but the treatment is not covered by insurance. Larger vessels may be covered if symptomatic and we would refer to vascular surgeon if treatment desired.   Inflamed seborrheic keratosis (4) Right Mid Back x2, left flexor wrist x1, right upper calf x1  Symptomatic, irritating, patient would like treated.  Destruction of lesion - Right Mid Back x2, left flexor wrist x1, right upper calf x1  Destruction method: cryotherapy   Informed consent: discussed and consent obtained   Lesion destroyed using liquid nitrogen: Yes   Region frozen until ice ball extended beyond lesion: Yes   Outcome: patient tolerated procedure well with no complications   Post-procedure details: wound care instructions  given   Additional details:  Prior to procedure, discussed risks of blister formation, small wound, skin dyspigmentation, or rare scar following cryotherapy. Recommend Vaseline ointment  to treated areas while healing.   AK (actinic keratosis) Right Upper Sternum x1  Actinic keratoses are precancerous spots that appear secondary to cumulative UV radiation exposure/sun exposure over time. They are chronic with expected duration over 1 year. A portion of actinic keratoses will progress to squamous cell carcinoma of the skin. It is not possible to reliably predict which spots will progress to skin cancer and so treatment is recommended to prevent development of skin cancer.  Recommend daily broad spectrum sunscreen SPF 30+ to sun-exposed areas, reapply every 2 hours as needed.  Recommend staying in the shade or wearing long sleeves, sun glasses (UVA+UVB protection) and wide brim hats (4-inch brim around the entire circumference of the hat). Call for new or changing lesions.  Destruction of lesion - Right Upper Sternum x1  Destruction method: cryotherapy   Informed consent: discussed and consent obtained   Lesion destroyed using liquid nitrogen: Yes   Region frozen until ice ball extended beyond lesion: Yes   Outcome: patient tolerated procedure well with no complications   Post-procedure details: wound care instructions given   Additional details:  Prior to procedure, discussed risks of blister formation, small wound, skin dyspigmentation, or rare scar following cryotherapy. Recommend Vaseline ointment to treated areas while healing.    Return in about 1 year (around 11/09/2023) for TBSE.  I, Lawson Radar, CMA, am acting as scribe for Willeen Niece, MD.   Documentation: I have reviewed the above documentation for accuracy and completeness, and I agree with the above.  Willeen Niece, MD

## 2022-11-16 ENCOUNTER — Ambulatory Visit (INDEPENDENT_AMBULATORY_CARE_PROVIDER_SITE_OTHER): Payer: Medicare Other | Admitting: Family Medicine

## 2022-11-16 ENCOUNTER — Encounter: Payer: Self-pay | Admitting: Family Medicine

## 2022-11-16 DIAGNOSIS — R5383 Other fatigue: Secondary | ICD-10-CM | POA: Diagnosis not present

## 2022-11-16 DIAGNOSIS — E559 Vitamin D deficiency, unspecified: Secondary | ICD-10-CM

## 2022-11-16 LAB — CBC WITH DIFFERENTIAL/PLATELET
Basophils Absolute: 0 10*3/uL (ref 0.0–0.1)
Basophils Relative: 0.5 % (ref 0.0–3.0)
Eosinophils Absolute: 0.1 10*3/uL (ref 0.0–0.7)
Eosinophils Relative: 2.3 % (ref 0.0–5.0)
HCT: 39.2 % (ref 36.0–46.0)
Hemoglobin: 13.2 g/dL (ref 12.0–15.0)
Lymphocytes Relative: 26.8 % (ref 12.0–46.0)
Lymphs Abs: 1.7 10*3/uL (ref 0.7–4.0)
MCHC: 33.8 g/dL (ref 30.0–36.0)
MCV: 92.4 fl (ref 78.0–100.0)
Monocytes Absolute: 0.5 10*3/uL (ref 0.1–1.0)
Monocytes Relative: 7.3 % (ref 3.0–12.0)
Neutro Abs: 4.1 10*3/uL (ref 1.4–7.7)
Neutrophils Relative %: 63.1 % (ref 43.0–77.0)
Platelets: 253 10*3/uL (ref 150.0–400.0)
RBC: 4.24 Mil/uL (ref 3.87–5.11)
RDW: 12.8 % (ref 11.5–15.5)
WBC: 6.5 10*3/uL (ref 4.0–10.5)

## 2022-11-16 LAB — COMPREHENSIVE METABOLIC PANEL
ALT: 15 U/L (ref 0–35)
AST: 19 U/L (ref 0–37)
Albumin: 4.3 g/dL (ref 3.5–5.2)
Alkaline Phosphatase: 65 U/L (ref 39–117)
BUN: 19 mg/dL (ref 6–23)
CO2: 23 mEq/L (ref 19–32)
Calcium: 9.3 mg/dL (ref 8.4–10.5)
Chloride: 98 mEq/L (ref 96–112)
Creatinine, Ser: 1.07 mg/dL (ref 0.40–1.20)
GFR: 48.39 mL/min — ABNORMAL LOW (ref >60.00)
Glucose, Bld: 120 mg/dL — ABNORMAL HIGH (ref 70–99)
Potassium: 3.9 mEq/L (ref 3.5–5.1)
Sodium: 135 mEq/L (ref 135–145)
Total Bilirubin: 0.4 mg/dL (ref 0.2–1.2)
Total Protein: 7.2 g/dL (ref 6.0–8.3)

## 2022-11-16 LAB — VITAMIN B12: Vitamin B-12: >1500 pg/mL — ABNORMAL HIGH (ref 211–911)

## 2022-11-16 LAB — VITAMIN D 25 HYDROXY (VIT D DEFICIENCY, FRACTURES): VITD: 53.41 ng/mL (ref 30.00–100.00)

## 2022-11-16 LAB — TSH: TSH: 3.77 u[IU]/mL (ref 0.35–5.50)

## 2022-11-16 NOTE — Progress Notes (Signed)
Patient ID: Patty Shelton, female    DOB: [birth year], 82 y.o.   MRN: [id number]  This visit was conducted in person.  There were no vitals taken for this visit.   CC:  Chief Complaint  Patient presents with   Fatigue    Would like to have her TSH checked.    Dehydration    Patient would like to discuss this; isn't having any sx of dehydration but wants to make sure she's okay.     Subjective:   HPI: Patty Shelton is a 82 y.o. female presenting on 11/16/2022 for Fatigue (Would like to have her TSH checked. ) and Dehydration (Patient would like to discuss this; isn't having any sx of dehydration but wants to make sure she's okay. )  She has been feeling more tired in last  several months.  Wakes up feeling well rested, occ waking up tired.  Sleeping well at night 8 hours Less endurance... No CP, no SOB  Has noted more belly fat. She has been more cold than others.  No swelling.   No family history of thyroid issues.    She has been under more stress lately, managing well... has mouse in pantry, family issues. Daughter living with her.   PHQ9:13  GAD7: 10      Relevant past medical, surgical, family and social history reviewed and updated as indicated. Interim medical history since our last visit reviewed. Allergies and medications reviewed and updated. Outpatient Medications Prior to Visit  Medication Sig Dispense Refill   acetaminophen (TYLENOL) 500 MG tablet Take 500 mg by mouth every 6 (six) hours as needed.     ADVAIR HFA 115-21 MCG/ACT inhaler USE 2 INHALATIONS TWICE A DAY 12 g 5   Biotin 10 MG TABS Take 1 tablet by mouth daily.     cetirizine (ZYRTEC) 10 MG tablet Take 10 mg by mouth daily.     Cholecalciferol (VITAMIN D3) 50 MCG (2000 UT) TABS Take 1 tablet by mouth daily.     CRANBERRY PO Take 1 capsule by mouth daily.     diclofenac sodium (VOLTAREN) 1 % GEL Apply 4 g topically 4 (four) times daily as needed.     famciclovir (FAMVIR) 250 MG tablet TAKE 1 TABLET  DAILY 90 tablet 3   fluticasone (FLONASE) 50 MCG/ACT nasal spray Place 2 sprays into both nostrils as needed.      hydrochlorothiazide (HYDRODIURIL) 25 MG tablet TAKE ONE-HALF (1/2) TABLET DAILY 45 tablet 3   losartan (COZAAR) 100 MG tablet TAKE 1 TABLET DAILY 90 tablet 1   Multiple Minerals-Vitamins (CAL-MAG-ZINC-D PO) Take 1 tablet by mouth daily.     Respiratory Therapy Supplies (FLUTTER) DEVI 1 Device by Does not apply route as directed. 1 each 0   rosuvastatin (CRESTOR) 10 MG tablet TAKE 1 TABLET DAILY 90 tablet 3   traMADol (ULTRAM) 50 MG tablet Take 1 tablet (50 mg total) by mouth every 8 (eight) hours as needed for moderate pain. 20 tablet 0   vitamin B-12 (CYANOCOBALAMIN) 1000 MCG tablet Take 1,000 mcg by mouth daily.     vitamin E 180 MG (400 UNITS) capsule Take 400 Units by mouth daily.     guaiFENesin (MUCINEX) 600 MG 12 hr tablet Take 600 mg by mouth 2 (two) times daily as needed.     No facility-administered medications prior to visit.     Per HPI unless specifically indicated in ROS section below Review of Systems  Constitutional:  Negative for  fatigue and fever.  HENT:  Negative for congestion.   Eyes:  Negative for pain.  Respiratory:  Negative for cough and shortness of breath.   Cardiovascular:  Negative for chest pain, palpitations and leg swelling.  Gastrointestinal:  Negative for abdominal pain.  Genitourinary:  Negative for dysuria and vaginal bleeding.  Musculoskeletal:  Negative for back pain.  Neurological:  Negative for syncope, light-headedness and headaches.  Psychiatric/Behavioral:  Negative for dysphoric mood.    Objective:  There were no vitals taken for this visit.  Wt Readings from Last 3 Encounters:  10/11/22 180 lb 9.6 oz (81.9 kg)  06/16/22 178 lb 8 oz (81 kg)  02/07/22 178 lb 8 oz (81 kg)      Physical Exam Constitutional:      General: She is not in acute distress.    Appearance: Normal appearance. She is well-developed. She is not  ill-appearing or toxic-appearing.  HENT:     Head: Normocephalic.     Right Ear: Hearing, tympanic membrane, ear canal and external ear normal. Tympanic membrane is not erythematous, retracted or bulging.     Left Ear: Hearing, tympanic membrane, ear canal and external ear normal. Tympanic membrane is not erythematous, retracted or bulging.     Nose: No mucosal edema or rhinorrhea.     Right Sinus: No maxillary sinus tenderness or frontal sinus tenderness.     Left Sinus: No maxillary sinus tenderness or frontal sinus tenderness.     Mouth/Throat:     Pharynx: Uvula midline.  Eyes:     General: Lids are normal. Lids are everted, no foreign bodies appreciated.     Conjunctiva/sclera: Conjunctivae normal.     Pupils: Pupils are equal, round, and reactive to light.  Neck:     Thyroid: No thyroid mass or thyromegaly.     Vascular: No carotid bruit.     Trachea: Trachea normal.  Cardiovascular:     Rate and Rhythm: Normal rate and regular rhythm.     Pulses: Normal pulses.     Heart sounds: Normal heart sounds, S1 normal and S2 normal. No murmur heard.    No friction rub. No gallop.  Pulmonary:     Effort: Pulmonary effort is normal. No tachypnea or respiratory distress.     Breath sounds: Normal breath sounds. No decreased breath sounds, wheezing, rhonchi or rales.  Abdominal:     General: Bowel sounds are normal.     Palpations: Abdomen is soft.     Tenderness: There is no abdominal tenderness.  Musculoskeletal:     Cervical back: Normal range of motion and neck supple.  Skin:    General: Skin is warm and dry.     Findings: No rash.  Neurological:     Mental Status: She is alert.  Psychiatric:        Mood and Affect: Mood is not anxious or depressed.        Speech: Speech normal.        Behavior: Behavior normal. Behavior is cooperative.        Thought Content: Thought content normal.        Judgment: Judgment normal.       Results for orders placed or performed in visit on  06/09/22  Uric acid  Result Value Ref Range   Uric Acid, Serum 6.7 2.4 - 7.0 mg/dL  Comprehensive metabolic panel  Result Value Ref Range   Sodium 136 135 - 145 mEq/L   Potassium 4.6 3.5 - 5.1 mEq/L  Chloride 100 96 - 112 mEq/L   CO2 30 19 - 32 mEq/L   Glucose, Bld 103 (H) 70 - 99 mg/dL   BUN 20 6 - 23 mg/dL   Creatinine, Ser 7.82 0.40 - 1.20 mg/dL   Total Bilirubin 0.4 0.2 - 1.2 mg/dL   Alkaline Phosphatase 59 39 - 117 U/L   AST 17 0 - 37 U/L   ALT 16 0 - 35 U/L   Total Protein 6.8 6.0 - 8.3 g/dL   Albumin 4.3 3.5 - 5.2 g/dL   GFR 95.62 (L) >13.08 mL/min   Calcium 9.2 8.4 - 10.5 mg/dL  Lipid panel  Result Value Ref Range   Cholesterol 162 0 - 200 mg/dL   Triglycerides 65.7 0.0 - 149.0 mg/dL   HDL 84.69 >62.95 mg/dL   VLDL 28.4 0.0 - 13.2 mg/dL   LDL Cholesterol 89 0 - 99 mg/dL   Total CHOL/HDL Ratio 3    NonHDL 102.83     Assessment and Plan  Other fatigue Assessment & Plan:  Acute  No specific symptoms. Will evaluate with labs including TSH, CMET, cbc, vit deficiency.  Continue regular exercise, consider toning.   Orders: -     Comprehensive metabolic panel -     CBC with Differential/Platelet -     TSH -     Vitamin B12  Vitamin D deficiency -     VITAMIN D 25 Hydroxy (Vit-D Deficiency, Fractures)    No follow-ups on file.   Kerby Nora, MD

## 2022-11-16 NOTE — Assessment & Plan Note (Signed)
Acute  No specific symptoms. Will evaluate with labs including TSH, CMET, cbc, vit deficiency.  Continue regular exercise, consider toning.

## 2022-11-21 ENCOUNTER — Telehealth: Payer: Self-pay | Admitting: Family Medicine

## 2022-11-21 NOTE — Telephone Encounter (Signed)
Returned patient's call and results were given. See lab report.

## 2022-11-21 NOTE — Telephone Encounter (Signed)
Patient returned call to the office regarding lab results. Would like a call back whenever possible. Please advise, thank you

## 2022-11-24 ENCOUNTER — Encounter: Payer: Self-pay | Admitting: Internal Medicine

## 2022-11-24 ENCOUNTER — Ambulatory Visit (INDEPENDENT_AMBULATORY_CARE_PROVIDER_SITE_OTHER): Payer: Medicare Other | Admitting: Internal Medicine

## 2022-11-24 VITALS — BP 130/80 | HR 80 | Temp 98.0°F | Ht 66.5 in | Wt 180.2 lb

## 2022-11-24 DIAGNOSIS — J479 Bronchiectasis, uncomplicated: Secondary | ICD-10-CM

## 2022-11-24 DIAGNOSIS — J411 Mucopurulent chronic bronchitis: Secondary | ICD-10-CM

## 2022-11-24 MED ORDER — ALBUTEROL SULFATE HFA 108 (90 BASE) MCG/ACT IN AERS
2.0000 | INHALATION_SPRAY | RESPIRATORY_TRACT | 10 refills | Status: AC | PRN
Start: 2022-11-24 — End: ?

## 2022-11-24 NOTE — Progress Notes (Signed)
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 No frank honeycombing. No definite progression since 11/27/2016   04/02/2020 Follow up : Bronchiectasis and ILD Patient has underlying mild bronchiectasis and ILD. Is independent and drives.  She has not on oxygen.  She declines the Covid vaccines and says she absolutely will not get this.   Previous high-resolution CT chest in April 2020 showed stable ILD and mild bronchiectasis with no progression since 2018. She is supposed to be using the flutter valve but says that she is not using this.     CHIEF COMPLAINT:   Follow-up bronchiectasis Follow-up chronic bronchitis      HISTORY OF PRESENT ILLNESS   shortness of breath improved since last office visit patient on Advair HFA but it seems like she has more productive cough over the last several months  Patient was started on Advair HFA which seems to have improved her symptoms significantly  Last office visit we planned to decrease her ADVAIR HFA dosing to 50% to see if this would help  At this time we will resume traditional inhaler therapy with Advair HFA 2 puffs in a.m. and p.m. Is to rinse mouth after every use  Will also start albuterol inhaler as needed  No exacerbation at this time No evidence of heart failure at this time No evidence or signs of infection at this time No respiratory distress No fevers, chills, nausea, vomiting, diarrhea No evidence of lower extremity edema No evidence hemoptysis  Non Smoker Patient is noncompliant with flutter valve Recommend 10-15 times per day     Past Medical History:  Diagnosis Date   Agatston CAC score, <100 05/04/2021   Coronary CTA : Coronary Calcium Score 27.  Minimal proximal  (dominant) RCA calcified plaque (0-24%) with no plaque noted in the LAD and nondominant LCx.   Hyperlipidemia with target LDL less than 100 03/27/2009   Qualifier: Diagnosis of  By: Ermalene Searing MD, Amy     Hypertension     Social History   Tobacco Use   Smoking status: Never   Smokeless tobacco: Never  Vaping Use   Vaping Use: Never used  Substance Use Topics   Alcohol use: No    Alcohol/week: 0.0 standard drinks of alcohol   Drug use: No     MEDICATIONS    Home Medication:  Current Outpatient Rx   Order #: [id number] Class: Historical Med   Order #: [id number] Class: Normal   Order #: [id number] Class: Historical Med   Order #: [id number] Class: Historical Med   Order #: [id number] Class: Historical Med   Order #: [id number] Class: Historical Med   Order #: [id number] Class: Historical Med   Order #: [id number] Class: Normal   Order #: [id number] Class: Historical Med   Order #: [id number] Class: Normal   Order #: [id number] Class: Normal   Order #: [id number] Class: Historical Med   Order #: [id number] Class: Print   Order #: [id number] Class: Normal   Order #: [id number] Class: Normal   Order #: [id number] Class: Historical Med   Order #: [id number] Class: Historical Med    Current Medication:  Current Outpatient Medications:    acetaminophen (TYLENOL) 500 MG tablet, Take 500 mg by mouth every 6 (six) hours as needed., Disp: , Rfl:    ADVAIR HFA 115-21 MCG/ACT inhaler, USE 2 INHALATIONS TWICE A DAY, Disp: 12 g, Rfl:  5   Biotin 10 MG TABS, Take 1 tablet by mouth daily., Disp: , Rfl:    cetirizine (ZYRTEC) 10 MG tablet, Take 10 mg by mouth daily., Disp: , Rfl:    Cholecalciferol (VITAMIN D3) 50 MCG (2000 UT) TABS, Take 1 tablet by mouth daily., Disp: , Rfl:    CRANBERRY PO, Take 1 capsule by mouth daily., Disp: , Rfl:    diclofenac sodium (VOLTAREN) 1 % GEL, Apply 4 g topically 4 (four) times daily as needed., Disp: , Rfl:    famciclovir (FAMVIR) 250 MG tablet, TAKE 1 TABLET DAILY, Disp: 90 tablet, Rfl:  3   fluticasone (FLONASE) 50 MCG/ACT nasal spray, Place 2 sprays into both nostrils as needed. , Disp: , Rfl:    hydrochlorothiazide (HYDRODIURIL) 25 MG tablet, TAKE ONE-HALF (1/2) TABLET DAILY, Disp: 45 tablet, Rfl: 3   losartan (COZAAR) 100 MG tablet, TAKE 1 TABLET DAILY, Disp: 90 tablet, Rfl: 1   Multiple Minerals-Vitamins (CAL-MAG-ZINC-D PO), Take 1 tablet by mouth daily., Disp: , Rfl:    Respiratory Therapy Supplies (FLUTTER) DEVI, 1 Device by Does not apply route as directed., Disp: 1 each, Rfl: 0   rosuvastatin (CRESTOR) 10 MG tablet, TAKE 1 TABLET DAILY, Disp: 90 tablet, Rfl: 3   traMADol (ULTRAM) 50 MG tablet, Take 1 tablet (50 mg total) by mouth every 8 (eight) hours as needed for moderate pain., Disp: 20 tablet, Rfl: 0   vitamin B-12 (CYANOCOBALAMIN) 1000 MCG tablet, Take 1,000 mcg by mouth daily., Disp: , Rfl:    vitamin E 180 MG (400 UNITS) capsule, Take 400 Units by mouth daily., Disp: , Rfl:     ALLERGIES   Amlodipine besylate and Lipitor [atorvastatin]     REVIEW OF SYSTEMS   BP 130/80 (BP Location: Left Arm, Patient Position: Sitting, Cuff Size: Normal)   Pulse 80   Temp 98 F (36.7 C) (Oral)   Ht 5' 6.5" (1.689 m)   Wt 180 lb 3.2 oz (81.7 kg)   SpO2 97%   BMI 28.65 kg/m      Review of Systems: Gen:  Denies  fever, sweats, chills weight loss  HEENT: Denies blurred vision, double vision, ear pain, eye pain, hearing loss, nose bleeds, sore throat Cardiac:  No dizziness, chest pain or heaviness, chest tightness,edema, No JVD Resp:   No cough, -sputum production, -shortness of breath,-wheezing, -hemoptysis,  Other:  All other systems negative   Physical Examination:   General Appearance: No distress  EYES PERRLA, EOM intact.   NECK Supple, No JVD Pulmonary: normal breath sounds, No wheezing.  CardiovascularNormal S1,S2.  No m/r/g.   Abdomen: Benign, Soft, non-tender. Neurology UE/LE 5/5 strength, no focal deficits Ext pulses intact, cap refill  intact ALL OTHER ROS ARE NEGATIVE          IMAGING    I have Independently reviewed images of  CT chest 10/10/18 Interpretation: minimal b/l lower lobe predominant peripheral  ILD consider NSIP mild bronchiectasis No significant changes since 2018     I have Independently reviewed images of  CT chest 11/2016   Interpretation:minimal b/l lower lobe predominant peripheral  ILD consider NSIP mild bronchiectasis      ASSESSMENT/PLAN   82 year old pleasant white female seen today for follow-up chronic productive cough with chronic bronchitis in the setting of chronic allergic rhinitis with reflux disease in the setting of 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 2 puffs in p.m. Add albuterol 2 puffs every 4 hours as needed   Chronic bronchiectasis Encouraged her to use flutter valve 10-15 times per day Exercise as tolerated  No indication for steroids at this time No indication for antibiotics at this time No indication for bronchoscopy at this time  No imaging needed at this time     MEDICATION ADJUSTMENTS/LABS AND TESTS ORDERED: Restart Advair HFA 2 puffs a.m. 2 puffs p.m. rinse mouth after every use Will start albuterol inhaler as needed Monitor respiratory symptoms Avoid secondhand smoke Avoid SICK contacts Recommend  Masking  when appropriate Recommend Keep up-to-date with vaccinations RECOMMEND FLUTTER usage more frequently  CURRENT MEDICATIONS REVIEWED AT LENGTH WITH PATIENT TODAY  Follow-up in 6 months   Total time spent 22 minutes  Patty Shelton Santiago Glad, M.D.  [facility]  Medical Director [facility] Medical Director [facility] Cardio-Pulmonary Department

## 2022-11-24 NOTE — Patient Instructions (Addendum)
Please Take ADVAIR 2 puffs in the morning and rinse your mouth out and then take 2 puffs at night and rinse mouth out  We will start albuterol inhaler 2 puffs every 4 hours as needed  Avoid secondhand smoke Avoid SICK contacts Recommend  Masking  when appropriate Recommend Keep up-to-date with vaccinations

## 2022-11-25 DIAGNOSIS — M7061 Trochanteric bursitis, right hip: Secondary | ICD-10-CM | POA: Diagnosis not present

## 2022-12-14 ENCOUNTER — Telehealth: Payer: Self-pay | Admitting: Internal Medicine

## 2022-12-14 DIAGNOSIS — J4531 Mild persistent asthma with (acute) exacerbation: Secondary | ICD-10-CM

## 2022-12-14 MED ORDER — FLUTICASONE-SALMETEROL 115-21 MCG/ACT IN AERO
INHALATION_SPRAY | RESPIRATORY_TRACT | 0 refills | Status: DC
Start: 2022-12-14 — End: 2022-12-14

## 2022-12-14 MED ORDER — FLUTICASONE-SALMETEROL 115-21 MCG/ACT IN AERO
INHALATION_SPRAY | RESPIRATORY_TRACT | 2 refills | Status: DC
Start: 2022-12-14 — End: 2023-07-25

## 2022-12-14 NOTE — Telephone Encounter (Signed)
I have sent in the prescriptions and notified the patient.  Nothing further needed. 

## 2022-12-14 NOTE — Telephone Encounter (Signed)
Pt. Was recently seen by Dr. Belia Heman and on AVS she is taking adviar she is totally out she wants only one refill called into local pharmacy and the [facility]

## 2022-12-15 ENCOUNTER — Other Ambulatory Visit: Payer: Self-pay | Admitting: Family Medicine

## 2023-01-04 DIAGNOSIS — H524 Presbyopia: Secondary | ICD-10-CM | POA: Diagnosis not present

## 2023-01-04 DIAGNOSIS — H59813 Chorioretinal scars after surgery for detachment, bilateral: Secondary | ICD-10-CM | POA: Diagnosis not present

## 2023-01-04 DIAGNOSIS — H40023 Open angle with borderline findings, high risk, bilateral: Secondary | ICD-10-CM | POA: Diagnosis not present

## 2023-01-04 DIAGNOSIS — H52223 Regular astigmatism, bilateral: Secondary | ICD-10-CM | POA: Diagnosis not present

## 2023-01-04 DIAGNOSIS — H43813 Vitreous degeneration, bilateral: Secondary | ICD-10-CM | POA: Diagnosis not present

## 2023-01-04 DIAGNOSIS — H04123 Dry eye syndrome of bilateral lacrimal glands: Secondary | ICD-10-CM | POA: Diagnosis not present

## 2023-01-06 DIAGNOSIS — M7061 Trochanteric bursitis, right hip: Secondary | ICD-10-CM | POA: Diagnosis not present

## 2023-01-13 ENCOUNTER — Other Ambulatory Visit: Payer: Self-pay | Admitting: Family Medicine

## 2023-01-25 DIAGNOSIS — M7061 Trochanteric bursitis, right hip: Secondary | ICD-10-CM | POA: Diagnosis not present

## 2023-02-01 ENCOUNTER — Telehealth: Payer: Self-pay | Admitting: Family Medicine

## 2023-02-01 DIAGNOSIS — H9193 Unspecified hearing loss, bilateral: Secondary | ICD-10-CM

## 2023-02-01 NOTE — Telephone Encounter (Signed)
Patient would like to know if she could have a referral sent out to to have her hearing checked? She stated that she have been having a hard time hearing things lately.

## 2023-02-01 NOTE — Telephone Encounter (Signed)
She said [facility] would be fine

## 2023-02-01 NOTE — Telephone Encounter (Signed)
Patty Shelton, can you find out if she prefers [address] or [place]?  Thanks!

## 2023-02-02 NOTE — Addendum Note (Signed)
Addended by: Kerby Nora E on: 02/02/2023 01:29 PM   Modules accepted: Orders

## 2023-02-02 NOTE — Telephone Encounter (Signed)
Left message for [name] that Dr. Ermalene Searing has placed the referral for ENT and our referral coordinators will be reaching out to her with that appointment.

## 2023-02-02 NOTE — Telephone Encounter (Signed)
Let patient know I have sent the referral as requested

## 2023-02-03 DIAGNOSIS — M7061 Trochanteric bursitis, right hip: Secondary | ICD-10-CM | POA: Diagnosis not present

## 2023-02-10 DIAGNOSIS — M7061 Trochanteric bursitis, right hip: Secondary | ICD-10-CM | POA: Diagnosis not present

## 2023-02-17 DIAGNOSIS — M7061 Trochanteric bursitis, right hip: Secondary | ICD-10-CM | POA: Diagnosis not present

## 2023-02-24 DIAGNOSIS — M7061 Trochanteric bursitis, right hip: Secondary | ICD-10-CM | POA: Diagnosis not present

## 2023-04-26 ENCOUNTER — Emergency Department
Admission: EM | Admit: 2023-04-26 | Discharge: 2023-04-26 | Disposition: A | Discharge status: Home / Self Care | Payer: Medicare Other | Attending: Emergency Medicine | Admitting: Emergency Medicine

## 2023-04-26 ENCOUNTER — Emergency Department: Payer: Medicare Other

## 2023-04-26 DIAGNOSIS — S43401A Unspecified sprain of right shoulder joint, initial encounter: Secondary | ICD-10-CM | POA: Diagnosis not present

## 2023-04-26 DIAGNOSIS — N189 Chronic kidney disease, unspecified: Secondary | ICD-10-CM | POA: Diagnosis not present

## 2023-04-26 DIAGNOSIS — Y93E5 Activity, floor mopping and cleaning: Secondary | ICD-10-CM | POA: Diagnosis not present

## 2023-04-26 DIAGNOSIS — I129 Hypertensive chronic kidney disease with stage 1 through stage 4 chronic kidney disease, or unspecified chronic kidney disease: Secondary | ICD-10-CM | POA: Diagnosis not present

## 2023-04-26 DIAGNOSIS — W010XXA Fall on same level from slipping, tripping and stumbling without subsequent striking against object, initial encounter: Secondary | ICD-10-CM | POA: Diagnosis not present

## 2023-04-26 DIAGNOSIS — Y92009 Unspecified place in unspecified non-institutional (private) residence as the place of occurrence of the external cause: Secondary | ICD-10-CM | POA: Diagnosis not present

## 2023-04-26 DIAGNOSIS — S43421A Sprain of right rotator cuff capsule, initial encounter: Secondary | ICD-10-CM | POA: Diagnosis not present

## 2023-04-26 DIAGNOSIS — M25511 Pain in right shoulder: Secondary | ICD-10-CM | POA: Diagnosis present

## 2023-04-26 MED ORDER — TRAMADOL HCL 50 MG PO TABS
50.0000 mg | ORAL_TABLET | Freq: Once | ORAL | Status: AC
  Administered 2023-04-26: 50 mg via ORAL
  Filled 2023-04-26: qty 1

## 2023-04-26 MED ORDER — TRAMADOL HCL 50 MG PO TABS: 50.0000 mg | ORAL_TABLET | Freq: Three times a day (TID) | ORAL | 0 refills | Status: AC | PRN

## 2023-04-26 NOTE — ED Triage Notes (Signed)
Pt states that she was mopping the floor and fell, landing on her R shoulder. Pt denies head injury/LOC. No blood thinners. Denies other injury.

## 2023-04-26 NOTE — ED Provider Notes (Signed)
[facility] Emergency Department Provider Note     Event Date/Time   First MD Initiated Contact with Patient 04/26/23 1829     (approximate)   History   Fall and Shoulder Injury   HPI  Patty Shelton is a 82 y.o. female with a history of HLD, HTN, CKD, and osteopenia, presents to the ED for evaluation of injury sustained following a mechanical fall, around 3 pm.  Patient was in her home mopping the floor this morning, when she apparently slipped and fell.  She describes landing on her right shoulder during the injury.  She denies any head injury or LOC.  No reported blood thinner use.  She presents to the ED denying any other injury at this time. She is endorsing pain with AROM. She taken medication in the interim for symptom relief.  Physical Exam   Triage Vital Signs: ED Triage Vitals  Encounter Vitals Group     BP 04/26/23 1617 (!) 142/66     Systolic BP Percentile --      Diastolic BP Percentile --      Pulse Rate 04/26/23 1617 79     Resp 04/26/23 1617 16     Temp 04/26/23 1617 97.8 F (36.6 C)     Temp Source 04/26/23 1617 Oral     SpO2 04/26/23 1617 98 %     Weight 04/26/23 1618 180 lb (81.6 kg)     Height 04/26/23 1618 5\' 6"  (1.676 m)     Head Circumference --      Peak Flow --      Pain Score 04/26/23 1618 3     Pain Loc --      Pain Education --      Exclude from Growth Chart --     Most recent vital signs: Vitals:   04/26/23 1617 04/26/23 2052  BP: (!) 142/66 136/70  Pulse: 79 75  Resp: 16 20  Temp: 97.8 F (36.6 C) 98 F (36.7 C)  SpO2: 98% 99%    General Awake, no distress. NAD A&O x 4 HEENT NCAT. PERRL. EOMI. No rhinorrhea. Mucous membranes are moist.  CV:  Good peripheral perfusion. RRR RESP:  Normal effort. CTA ABD:  No distention.  MSK:  No gross right shoulder deformity, dislocation, sulcus sign.  Normal active range of motion to the right shoulder.  No evidence internal derangement.  Mild impingement sign on  exam.   ED Results / Procedures / Treatments   Labs (all labs ordered are listed, but only abnormal results are displayed) Labs Reviewed - No data to display   EKG  RADIOLOGY  I personally viewed and evaluated these images as part of my medical decision making, as well as reviewing the written report by the radiologist.  ED Provider Interpretation: No acute findings on my interpretation of shoulder x-rays  DG Right Shoulder  IMPRESSION: No acute fracture or dislocation.    PROCEDURES:  Critical Care performed: No  Procedures   MEDICATIONS ORDERED IN ED: Medications  traMADol (ULTRAM) tablet 50 mg (50 mg Oral Given 04/26/23 2037)     IMPRESSION / MDM / ASSESSMENT AND PLAN / ED COURSE  I reviewed the triage vital signs and the nursing notes.                              Differential diagnosis includes, but is not limited to, .  Patient's presentation is most  consistent with acute complicated illness / injury requiring diagnostic workup.  Patient's diagnosis is consistent with right shoulder sprain following a mechanical fall.  No radiologic evidence of any acute fracture or dislocation.  Patient does have evidence of some mild tendinitis of the shoulder without frank rotator cuff deficit.  Patient will be placed in a shoulder immobilizer for comfort, and discharged home with prescriptions for tramadol. Patient is to follow up with [facility] as discussed, as needed or otherwise directed. Patient is given ED precautions to return to the ED for any worsening or new symptoms.   FINAL CLINICAL IMPRESSION(S) / ED DIAGNOSES   Final diagnoses:  Fall in home, initial encounter  Sprain of right rotator cuff capsule, initial encounter     Rx / DC Orders   ED Discharge Orders          Ordered    traMADol (ULTRAM) 50 MG tablet  3 times daily PRN        04/26/23 2046             Note:  This document was prepared using Dragon voice recognition software and may  include unintentional dictation errors.    Lissa Hoard, PA-C 04/28/23 Glena [place]    Phineas Semen, MD 05/02/23 [phone]

## 2023-04-26 NOTE — Discharge Instructions (Addendum)
Your exam and x-rays are normal and reassuring at this time.  You have evidence of some shoulder sprain and mild tendinitis.  Use the arm sling for support and comfort.  Take the pain medicine as needed.  May take OTC Tylenol or Motrin for additional nondrowsy pain relief.  Apply ice to help reduce pain and swelling.  Perform pendulum exercises as demonstrated to improve range of motion.  Follow-up with [facility] for ongoing concerns.

## 2023-05-03 ENCOUNTER — Ambulatory Visit (INDEPENDENT_AMBULATORY_CARE_PROVIDER_SITE_OTHER): Payer: Medicare Other | Admitting: Internal Medicine

## 2023-05-03 ENCOUNTER — Encounter: Payer: Self-pay | Admitting: Internal Medicine

## 2023-05-03 VITALS — BP 132/76 | HR 72 | Temp 97.7°F | Ht 65.0 in | Wt 171.2 lb

## 2023-05-03 DIAGNOSIS — J479 Bronchiectasis, uncomplicated: Secondary | ICD-10-CM | POA: Diagnosis not present

## 2023-05-03 DIAGNOSIS — J42 Unspecified chronic bronchitis: Secondary | ICD-10-CM | POA: Diagnosis not present

## 2023-05-03 NOTE — Progress Notes (Signed)
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 No frank honeycombing. No definite progression since 11/27/2016   04/02/2020 Follow up : Bronchiectasis and ILD Patient has underlying mild bronchiectasis and ILD. Is independent and drives.  She has not on oxygen.  She declines the Covid vaccines and says she absolutely will not get this.   Previous high-resolution CT chest in April 2020 showed stable ILD and mild bronchiectasis with no progression since 2018. She is supposed to be using the flutter valve but says that she is not using this.     CHIEF COMPLAINT:   Follow-up assessment for bronchiectasis Follow-up assessment for chronic bronchitis    HISTORY OF PRESENT ILLNESS  Shortness of breath improved since last office visit Patient was compliant with Advair HFA This has improved her symptoms significantly We try to decrease her Advair HFA dosing to 50% however it did not help Continue traditional Advair HFA 2 puffs in a.m. and in p.m. Advised to rinse mouth after every use  Patient uses albuterol inhaler as needed Very infrequent use  No exacerbation at this time No evidence of heart failure at this time No evidence or signs of infection at this time No respiratory distress No fevers, chills, nausea, vomiting, diarrhea No evidence of lower extremity edema No evidence hemoptysis  Non Smoker Patient is noncompliant with flutter valve Recommend 10-15 times per day     Past Medical History:  Diagnosis Date   Agatston CAC score, <100 05/04/2021   Coronary CTA : Coronary Calcium Score 27.  Minimal proximal (dominant) RCA calcified plaque (0-24%) with no plaque noted in the LAD and nondominant LCx.   Hyperlipidemia with target  LDL less than 100 03/27/2009   Qualifier: Diagnosis of  By: Ermalene Searing MD, Amy     Hypertension     Social History   Tobacco Use   Smoking status: Never   Smokeless tobacco: Never  Vaping Use   Vaping status: Never Used  Substance Use Topics   Alcohol use: No    Alcohol/week: 0.0 standard drinks of alcohol   Drug use: No     MEDICATIONS    Home Medication:  Current Outpatient Rx   Order #: [id number] Class: Historical Med   Order #: [id number] Class: Normal   Order #: [id number] Class: Historical Med   Order #: [id number] Class: Historical Med   Order #: [id number] Class: Historical Med   Order #: [id number] Class: Historical Med   Order #: [id number] Class: Historical Med   Order #: [id number] Class: Normal   Order #: [id number] Class: Historical Med   Order #: [id number] Class: Normal   Order #: [id number] Class: Normal   Order #: [id number] Class: Normal   Order #: [id number] Class: Historical Med   Order #: [id number] Class: Print   Order #: [id number] Class: Normal   Order #: [id number] Class: Historical Med   Order #: [id number] Class: Historical Med    Current Medication:  Current Outpatient Medications:    acetaminophen (TYLENOL) 500 MG tablet, Take 500 mg by mouth every 6 (six) hours as needed., Disp: , Rfl:    albuterol (VENTOLIN HFA) 108 (90 Base) MCG/ACT inhaler, Inhale 2 puffs into the lungs every 4 (four) hours as needed for wheezing or shortness of breath., Disp: 8 g, Rfl: 10   Biotin 10 MG TABS, Take 1 tablet by mouth daily., Disp: , Rfl:  cetirizine (ZYRTEC) 10 MG tablet, Take 10 mg by mouth daily., Disp: , Rfl:    Cholecalciferol (VITAMIN D3) 50 MCG (2000 UT) TABS, Take 1 tablet by mouth daily., Disp: , Rfl:    CRANBERRY PO, Take 1 capsule by mouth daily., Disp: , Rfl:    diclofenac sodium (VOLTAREN) 1 % GEL, Apply 4 g topically 4 (four) times daily as needed., Disp: , Rfl:    famciclovir (FAMVIR) 250 MG tablet, TAKE 1 TABLET DAILY, Disp: 90 tablet, Rfl: 3   fluticasone (FLONASE) 50  MCG/ACT nasal spray, Place 2 sprays into both nostrils as needed. , Disp: , Rfl:    fluticasone-salmeterol (ADVAIR HFA) 115-21 MCG/ACT inhaler, USE 2 INHALATIONS TWICE A DAY, Disp: 3 each, Rfl: 2   hydrochlorothiazide (HYDRODIURIL) 25 MG tablet, TAKE ONE-HALF (1/2) TABLET DAILY, Disp: 45 tablet, Rfl: 3   losartan (COZAAR) 100 MG tablet, TAKE 1 TABLET DAILY, Disp: 90 tablet, Rfl: 1   Multiple Minerals-Vitamins (CAL-MAG-ZINC-D PO), Take 1 tablet by mouth daily., Disp: , Rfl:    Respiratory Therapy Supplies (FLUTTER) DEVI, 1 Device by Does not apply route as directed., Disp: 1 each, Rfl: 0   rosuvastatin (CRESTOR) 10 MG tablet, TAKE 1 TABLET DAILY, Disp: 90 tablet, Rfl: 1   vitamin B-12 (CYANOCOBALAMIN) 1000 MCG tablet, Take 1,000 mcg by mouth daily., Disp: , Rfl:    vitamin E 180 MG (400 UNITS) capsule, Take 400 Units by mouth daily., Disp: , Rfl:     ALLERGIES   Amlodipine besylate and Lipitor [atorvastatin]    BP 132/76 (BP Location: Left Arm, Cuff Size: Normal)   Pulse 72   Temp 97.7 F (36.5 C) (Temporal)   Ht 5\' 5"  (1.651 m)   Wt 171 lb 3.2 oz (77.7 kg)   SpO2 94%   BMI 28.49 kg/m      Review of Systems: Gen:  Denies  fever, sweats, chills weight loss  HEENT: Denies blurred vision, double vision, ear pain, eye pain, hearing loss, nose bleeds, sore throat Cardiac:  No dizziness, chest pain or heaviness, chest tightness,edema, No JVD Resp:   No cough, -sputum production, -shortness of breath,-wheezing, -hemoptysis,  Other:  All other systems negative   Physical Examination:   General Appearance: No distress  EYES PERRLA, EOM intact.   NECK Supple, No JVD Pulmonary: normal breath sounds, No wheezing.  CardiovascularNormal S1,S2.  No m/r/g.   Abdomen: Benign, Soft, non-tender. Neurology UE/LE 5/5 strength, no focal deficits Ext pulses intact, cap refill intact ALL OTHER ROS ARE NEGATIVE         IMAGING    I have Independently reviewed images of  CT chest  10/10/18 Interpretation: minimal b/l lower lobe predominant peripheral  ILD consider NSIP mild bronchiectasis No significant changes since 2018     I have Independently reviewed images of  CT chest 11/2016   Interpretation:minimal b/l lower lobe predominant peripheral  ILD consider NSIP mild bronchiectasis      ASSESSMENT/PLAN   82 year old pleasant white female seen today for follow-up assessment for chronic productive cough chronic bronchitis with chronic allergic rhinitis in the setting of 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   At this time her symptoms are controlled with Advair HFA Improved cough Less bronchitis symptoms Patient is doing well with therapy Patient is to use advair and albuterol as needed Albuterol as needed   No indication for steroids at this time No indication for antibiotics at this time No indication for bronchoscopy at this  time No imaging needed at this time  Chronic bronchitis I have explained to her encouraged her to use her flutter valve 10-15 times per day Continue exercise as tolerated     MEDICATION ADJUSTMENTS/LABS AND TESTS ORDERED: Monitor respiratory symptoms Avoid secondhand smoke Avoid SICK contacts Recommend  Masking  when appropriate Recommend Keep up-to-date with vaccinations RECOMMEND FLUTTER usage more frequently  CURRENT MEDICATIONS REVIEWED AT LENGTH WITH PATIENT TODAY  Follow-up in 6 months   Total time spent 33 minutes  Quinterious Walraven Santiago Glad, M.D.  [facility]  Medical Director [facility] Medical Director [facility] Cardio-Pulmonary Department

## 2023-05-03 NOTE — Patient Instructions (Signed)
Plan to use Advair and  your Albuterol inhalers as needed for cough, chest congestion, wheezing, sneezing  Avoid secondhand smoke Avoid SICK contacts Recommend  Masking  when appropriate Recommend Keep up-to-date with vaccinations

## 2023-05-04 DIAGNOSIS — S40011A Contusion of right shoulder, initial encounter: Secondary | ICD-10-CM | POA: Diagnosis not present

## 2023-05-18 DIAGNOSIS — S40011A Contusion of right shoulder, initial encounter: Secondary | ICD-10-CM | POA: Diagnosis not present

## 2023-05-24 ENCOUNTER — Ambulatory Visit: Payer: Medicare Other | Admitting: Dermatology

## 2023-05-29 ENCOUNTER — Other Ambulatory Visit: Payer: Self-pay | Admitting: Family Medicine

## 2023-05-29 NOTE — Telephone Encounter (Signed)
Please schedule CPE with [facility] labs prior with Dr. Ermalene Searing after 06/17/2023.

## 2023-05-29 NOTE — Telephone Encounter (Signed)
LVM for patient to cb and scheduled.

## 2023-05-30 ENCOUNTER — Ambulatory Visit
Admission: RE | Admit: 2023-05-30 | Discharge: 2023-05-30 | Disposition: A | Discharge status: Home / Self Care | Payer: Medicare Other | Source: Ambulatory Visit | Attending: Emergency Medicine | Admitting: Emergency Medicine

## 2023-05-30 VITALS — BP 153/74 | HR 88 | Temp 98.1°F | Resp 17

## 2023-05-30 DIAGNOSIS — J069 Acute upper respiratory infection, unspecified: Secondary | ICD-10-CM | POA: Diagnosis not present

## 2023-05-30 MED ORDER — PREDNISONE 20 MG PO TABS: 40.0000 mg | ORAL_TABLET | Freq: Every day | ORAL | 0 refills | Status: DC

## 2023-05-30 MED ORDER — BENZONATATE 100 MG PO CAPS: 100.0000 mg | ORAL_CAPSULE | Freq: Three times a day (TID) | ORAL | 0 refills | Status: DC

## 2023-05-30 MED ORDER — AZITHROMYCIN 250 MG PO TABS: 250.0000 mg | ORAL_TABLET | Freq: Every day | ORAL | 0 refills | Status: DC

## 2023-05-30 NOTE — ED Provider Notes (Signed)
Renaldo Fiddler    CSN: [id number] Arrival date & time: 05/30/23  0940      History   Chief Complaint Chief Complaint  Patient presents with   Cough    HPI Patty Shelton is a 82 y.o. female.   Patient presents for evaluation of nasal congestion, rhinorrhea, productive cough only in the morning, dry throughout the day, intermittent generalized headaches, scratchy throat, postnasal drip and shortness of breath with exertion present for 7 days.  Known sick contact in household.  Tolerating food and liquids.  Has been using Mucinex and Robitussin and nasal drops.  Past Medical History:  Diagnosis Date   Agatston CAC score, <100 05/04/2021   Coronary CTA : Coronary Calcium Score 27.  Minimal proximal (dominant) RCA calcified plaque (0-24%) with no plaque noted in the LAD and nondominant LCx.   Hyperlipidemia with target LDL less than 100 03/27/2009   Qualifier: Diagnosis of  By: Ermalene Searing MD, Amy     Hypertension     Patient Active Problem List   Diagnosis Date Noted   Other fatigue 11/16/2022   Thoracic aortic atherosclerosis (HCC)-seen on CT 08/15/2021   Asthma 07/07/2021   Trochanteric bursitis of right hip 04/23/2021   Acute gout involving toe of right foot 04/23/2021   Acute bilateral low back pain without sciatica 12/18/2020   Bronchiectasis without complication (HCC) 04/02/2020   ILD (interstitial lung disease) (HCC) 04/02/2020   Capsulitis of metatarsophalangeal (MTP) joint of left foot 02/21/2019   Hav (hallux abducto valgus), left 02/21/2019   Coronary atherosclerosis 10/11/2018   GERD (gastroesophageal reflux disease) 03/28/2017   MRSA (methicillin resistant staph aureus) culture positive 03/28/2017   Mixed incontinence 06/24/2016   CKD (chronic kidney disease) stage 3, GFR 30-59 ml/min (HCC) 05/22/2015   DOE (dyspnea on exertion) 04/03/2014   Hearing loss 03/01/2013   Herpes genitalia 04/28/2011   Osteopenia 03/22/2011   Primary hypertension 02/11/2011    Hyperlipidemia with target LDL less than 100 03/27/2009   Allergic rhinitis 03/27/2009    Past Surgical History:  Procedure Laterality Date   CATARACT EXTRACTION     CT CTA CORONARY W/CA SCORE W/CM &/OR WO/CM  05/04/2021   Coronary Calcium Score 27.  Minimal proximal (dominant) RCA calcified plaque (0-24%) with no plaque noted in the LAD and nondominant LCx.  Normal PA size.mild aortic root and ascending aortic calcification-evaluation for dissection. (No significant extracardiac findings),   RETINAL DETACHMENT SURGERY     TRANSTHORACIC ECHOCARDIOGRAM  05/27/2021   [facility]: (Normal Study) EF 55 to 60%.  No R WMA.  GR 1 DD with mild LA dilation.  Normal RV size and function-normal RVP and RAP.  Normal valves.    OB History   No obstetric history on file.      Home Medications    Prior to Admission medications   Medication Sig Start Date End Date Taking? Authorizing Provider  azithromycin (ZITHROMAX) 250 MG tablet Take 1 tablet (250 mg total) by mouth daily. Take first 2 tablets together, then 1 every day until finished. 05/30/23  Yes France Lusty R, NP  benzonatate (TESSALON) 100 MG capsule Take 1 capsule (100 mg total) by mouth every 8 (eight) hours. 05/30/23  Yes Carey Lafon R, NP  predniSONE (DELTASONE) 20 MG tablet Take 2 tablets (40 mg total) by mouth daily. 05/30/23  Yes Marshella Tello, Elita Boone, NP  acetaminophen (TYLENOL) 500 MG tablet Take 500 mg by mouth every 6 (six) hours as needed.    [provider]  albuterol (VENTOLIN HFA) 108 (90 Base) MCG/ACT inhaler Inhale 2 puffs into the lungs every 4 (four) hours as needed for wheezing or shortness of breath. 11/24/22   Erin Fulling, MD  Biotin 10 MG TABS Take 1 tablet by mouth daily.    [provider]  cetirizine (ZYRTEC) 10 MG tablet Take 10 mg by mouth daily.    [provider]  Cholecalciferol (VITAMIN D3) 50 MCG (2000 UT) TABS Take 1 tablet by mouth daily.    [provider]  CRANBERRY PO Take  1 capsule by mouth daily.    [provider]  diclofenac sodium (VOLTAREN) 1 % GEL Apply 4 g topically 4 (four) times daily as needed.    [provider]  famciclovir (FAMVIR) 250 MG tablet TAKE 1 TABLET DAILY 08/26/22   Bedsole, Amy E, MD  fluticasone (FLONASE) 50 MCG/ACT nasal spray Place 2 sprays into both nostrils as needed.     [provider]  fluticasone-salmeterol (ADVAIR HFA) 660-63 MCG/ACT inhaler USE 2 INHALATIONS TWICE A DAY 12/14/22   Erin Fulling, MD  hydrochlorothiazide (HYDRODIURIL) 25 MG tablet TAKE ONE-HALF (1/2) TABLET DAILY 09/26/22   Bedsole, Amy E, MD  losartan (COZAAR) 100 MG tablet TAKE 1 TABLET DAILY 01/13/23   Bedsole, Amy E, MD  Multiple Minerals-Vitamins (CAL-MAG-ZINC-D PO) Take 1 tablet by mouth daily.    [provider]  Respiratory Therapy Supplies (FLUTTER) DEVI 1 Device by Does not apply route as directed. 10/15/18   Erin Fulling, MD  rosuvastatin (CRESTOR) 10 MG tablet TAKE 1 TABLET DAILY 05/29/23   Bedsole, Amy E, MD  vitamin B-12 (CYANOCOBALAMIN) 1000 MCG tablet Take 1,000 mcg by mouth daily.    [provider]  vitamin E 180 MG (400 UNITS) capsule Take 400 Units by mouth daily.    [provider]    Family History Family History  Problem Relation Age of Onset   Heart attack Mother 39       Apparently she started having issues at 63, but not sure if she had a heart attack at that time   Coronary artery disease Mother 36   Stroke Father    Hyperlipidemia Father    Dementia Father     Social History Social History   Tobacco Use   Smoking status: Never   Smokeless tobacco: Never  Vaping Use   Vaping status: Never Used  Substance Use Topics   Alcohol use: No    Alcohol/week: 0.0 standard drinks of alcohol   Drug use: No     Allergies   Amlodipine besylate and Lipitor [atorvastatin]   Review of Systems Review of Systems   Physical Exam Triage Vital Signs ED Triage Vitals  Encounter Vitals  Group     BP 05/30/23 0951 (!) 153/74     Systolic BP Percentile --      Diastolic BP Percentile --      Pulse Rate 05/30/23 0951 88     Resp 05/30/23 0951 17     Temp 05/30/23 0951 98.1 F (36.7 C)     Temp Source 05/30/23 0951 Oral     SpO2 05/30/23 0951 94 %     Weight --      Height --      Head Circumference --      Peak Flow --      Pain Score 05/30/23 0952 2     Pain Loc --      Pain Education --  Exclude from Growth Chart --    No data found.  Updated Vital Signs BP (!) 153/74 (BP Location: Left Arm)   Pulse 88   Temp 98.1 F (36.7 C) (Oral)   Resp 17   SpO2 94%   Visual Acuity Right Eye Distance:   Left Eye Distance:   Bilateral Distance:    Right Eye Near:   Left Eye Near:    Bilateral Near:     Physical Exam Constitutional:      Appearance: Normal appearance.  HENT:     Head: Normocephalic.     Right Ear: Tympanic membrane, ear canal and external ear normal.     Left Ear: Tympanic membrane, ear canal and external ear normal.     Nose: Congestion present. No rhinorrhea.     Mouth/Throat:     Pharynx: No oropharyngeal exudate or posterior oropharyngeal erythema.  Eyes:     Extraocular Movements: Extraocular movements intact.  Cardiovascular:     Rate and Rhythm: Normal rate and regular rhythm.     Pulses: Normal pulses.     Heart sounds: Normal heart sounds.  Pulmonary:     Effort: Pulmonary effort is normal.     Breath sounds: Normal breath sounds.  Musculoskeletal:     Cervical back: Normal range of motion and neck supple.  Skin:    General: Skin is warm and dry.  Neurological:     Mental Status: She is alert and oriented to person, place, and time. Mental status is at baseline.      UC Treatments / Results  Labs (all labs ordered are listed, but only abnormal results are displayed) Labs Reviewed - No data to display  EKG   Radiology No results found.  Procedures Procedures (including critical care time)  Medications  Ordered in UC Medications - No data to display  Initial Impression / Assessment and Plan / UC Course  I have reviewed the triage vital signs and the nursing notes.  Pertinent labs & imaging results that were available during my care of the patient were reviewed by me and considered in my medical decision making (see chart for details).  Acute URI  Patient is in no signs of distress nor toxic appearing.  Vital signs are stable.  Low suspicion for pneumonia, pneumothorax or bronchitis and therefore will defer imaging.  Prescribed azithromycin, prednisone and Tessalon. May use additional over-the-counter medications as needed for supportive care.  May follow-up with urgent care as needed if symptoms persist or worsen.   Final Clinical Impressions(s) / UC Diagnoses   Final diagnoses:  Acute URI     Discharge Instructions      Begin azithromycin as directed to provide coverage for bacteria  Begin prednisone every morning with food to open and relax airway, should settle shortness of breath  You may use Tessalon pill every 8 hours as needed to help calm coughing, may take this in addition to the over-the-counter medications  You can take Tylenol and/or Ibuprofen as needed for fever reduction and pain relief.   For cough: honey 1/2 to 1 teaspoon (you can dilute the honey in water or another fluid).  You can also use guaifenesin and dextromethorphan for cough. You can use a humidifier for chest congestion and cough.  If you don't have a humidifier, you can sit in the bathroom with the hot shower running.      For sore throat: try warm salt water gargles, cepacol lozenges, throat spray, warm tea or water with  lemon/honey, popsicles or ice, or OTC cold relief medicine for throat discomfort.   For congestion: take a daily anti-histamine like Zyrtec, Claritin, and a oral decongestant, such as pseudoephedrine.  You can also use Flonase 1-2 sprays in each nostril daily.   It is important to  stay hydrated: drink plenty of fluids (water, gatorade/powerade/pedialyte, juices, or teas) to keep your throat moisturized and help further relieve irritation/discomfort.    ED Prescriptions     Medication Sig Dispense Auth. Provider   azithromycin (ZITHROMAX) 250 MG tablet Take 1 tablet (250 mg total) by mouth daily. Take first 2 tablets together, then 1 every day until finished. 6 tablet Zakaria Sedor R, NP   predniSONE (DELTASONE) 20 MG tablet Take 2 tablets (40 mg total) by mouth daily. 10 tablet Tysin Salada R, NP   benzonatate (TESSALON) 100 MG capsule Take 1 capsule (100 mg total) by mouth every 8 (eight) hours. 21 capsule Heavenleigh Petruzzi, Elita Boone, NP      PDMP not reviewed this encounter.   Valinda Hoar, NP 05/30/23 1028

## 2023-05-30 NOTE — ED Triage Notes (Signed)
Patient to Urgent Care with complaints of cough/ chest congestion. Denies any known fevers.  Symptoms started approx 1 week ago.   Meds: Mucinex/ nasal spray.

## 2023-05-30 NOTE — Discharge Instructions (Signed)
Begin azithromycin as directed to provide coverage for bacteria  Begin prednisone every morning with food to open and relax airway, should settle shortness of breath  You may use Tessalon pill every 8 hours as needed to help calm coughing, may take this in addition to the over-the-counter medications  You can take Tylenol and/or Ibuprofen as needed for fever reduction and pain relief.   For cough: honey 1/2 to 1 teaspoon (you can dilute the honey in water or another fluid).  You can also use guaifenesin and dextromethorphan for cough. You can use a humidifier for chest congestion and cough.  If you don't have a humidifier, you can sit in the bathroom with the hot shower running.      For sore throat: try warm salt water gargles, cepacol lozenges, throat spray, warm tea or water with lemon/honey, popsicles or ice, or OTC cold relief medicine for throat discomfort.   For congestion: take a daily anti-histamine like Zyrtec, Claritin, and a oral decongestant, such as pseudoephedrine.  You can also use Flonase 1-2 sprays in each nostril daily.   It is important to stay hydrated: drink plenty of fluids (water, gatorade/powerade/pedialyte, juices, or teas) to keep your throat moisturized and help further relieve irritation/discomfort.

## 2023-06-21 ENCOUNTER — Ambulatory Visit: Payer: Medicare Other | Admitting: Dermatology

## 2023-06-21 DIAGNOSIS — L821 Other seborrheic keratosis: Secondary | ICD-10-CM

## 2023-06-21 DIAGNOSIS — L578 Other skin changes due to chronic exposure to nonionizing radiation: Secondary | ICD-10-CM

## 2023-06-21 DIAGNOSIS — L814 Other melanin hyperpigmentation: Secondary | ICD-10-CM | POA: Diagnosis not present

## 2023-06-21 DIAGNOSIS — W908XXA Exposure to other nonionizing radiation, initial encounter: Secondary | ICD-10-CM | POA: Diagnosis not present

## 2023-06-21 DIAGNOSIS — L820 Inflamed seborrheic keratosis: Secondary | ICD-10-CM

## 2023-06-21 NOTE — Patient Instructions (Addendum)
 Seborrheic Keratosis  What causes seborrheic keratoses? Seborrheic keratoses are harmless, common skin growths that first appear during adult life.  As time goes by, more growths appear.  Some people may develop a large number of them.  Seborrheic keratoses appear on both covered and uncovered body parts.  They are not caused by sunlight.  The tendency to develop seborrheic keratoses can be inherited.  They vary in color from skin-colored to gray, brown, or even black.  They can be either smooth or have a rough, warty surface.   Seborrheic keratoses are superficial and look as if they were stuck on the skin.  Under the microscope this type of keratosis looks like layers upon layers of skin.  That is why at times the top layer may seem to fall off, but the rest of the growth remains and re-grows.    Treatment Seborrheic keratoses do not need to be treated, but can easily be removed in the office.  Seborrheic keratoses often cause symptoms when they rub on clothing or jewelry.  Lesions can be in the way of shaving.  If they become inflamed, they can cause itching, soreness, or burning.  Removal of a seborrheic keratosis can be accomplished by freezing, burning, or surgery. If any spot bleeds, scabs, or grows rapidly, please return to have it checked, as these can be an indication of a skin cancer.  Due to recent changes in healthcare laws, you may see results of your pathology and/or laboratory studies on MyChart before the doctors have had a chance to review them. We understand that in some cases there may be results that are confusing or concerning to you. Please understand that not all results are received at the same time and often the doctors may need to interpret multiple results in order to provide you with the best plan of care or course of treatment. Therefore, we ask that you please give us  2 business days to thoroughly review all your results before contacting the office for clarification. Should  we see a critical lab result, you will be contacted sooner.   If You Need Anything After Your Visit  If you have any questions or concerns for your doctor, please call our main line at [phone] and press option 4 to reach your doctor's medical assistant. If no one answers, please leave a voicemail as directed and we will return your call as soon as possible. Messages left after 4 pm will be answered the following business day.   You may also send us  a message via MyChart. We typically respond to MyChart messages within 1-2 business days.  For prescription refills, please ask your pharmacy to contact our office. Our fax number is [phone].  If you have an urgent issue when the clinic is closed that cannot wait until the next business day, you can page your doctor at the number below.    Please note that while we do our best to be available for urgent issues outside of office hours, we are not available 24/7.   If you have an urgent issue and are unable to reach us , you may choose to seek medical care at your doctor's office, retail clinic, urgent care center, or emergency room.  If you have a medical emergency, please immediately call 911 or go to the emergency department.  Pager Numbers  - Dr. Hester: [phone]  - Dr. Jackquline: [phone]  - Dr. Claudene: [phone]   In the event of inclement weather, please call our main line  at [phone] for an update on the status of any delays or closures.  Dermatology Medication Tips: Please keep the boxes that topical medications come in in order to help keep track of the instructions about where and how to use these. Pharmacies typically print the medication instructions only on the boxes and not directly on the medication tubes.   If your medication is too expensive, please contact our office at [phone] option 4 or send us  a message through MyChart.   We are unable to tell what your co-pay for medications will be in  advance as this is different depending on your insurance coverage. However, we may be able to find a substitute medication at lower cost or fill out paperwork to get insurance to cover a needed medication.   If a prior authorization is required to get your medication covered by your insurance company, please allow us  1-2 business days to complete this process.  Drug prices often vary depending on where the prescription is filled and some pharmacies may offer cheaper prices.  The website www.goodrx.com contains coupons for medications through different pharmacies. The prices here do not account for what the cost may be with help from insurance (it may be cheaper with your insurance), but the website can give you the price if you did not use any insurance.  - You can print the associated coupon and take it with your prescription to the pharmacy.  - You may also stop by our office during regular business hours and pick up a GoodRx coupon card.  - If you need your prescription sent electronically to a different pharmacy, notify our office through [facility] or by phone at [phone] option 4.     Si Usted Necesita Algo Despus de Su Visita  Tambin puede enviarnos un mensaje a travs de Clinical cytogeneticist. Por lo general respondemos a los mensajes de MyChart en el transcurso de 1 a 2 das hbiles.  Para renovar recetas, por favor pida a su farmacia que se ponga en contacto con nuestra oficina. Randi lakes de fax es [place] [phone].  Si tiene un asunto urgente cuando la clnica est cerrada y que no puede esperar hasta el siguiente da hbil, puede llamar/localizar a su doctor(a) al nmero que aparece a continuacin.   Por favor, tenga en cuenta que aunque hacemos todo lo posible para estar disponibles para asuntos urgentes fuera del horario de [place], no estamos disponibles las 24 horas del da, los 7 [address]  [address] de la [place].   Si tiene un problema urgente y no puede comunicarse con nosotros, puede  optar por buscar atencin mdica  en el consultorio de su doctor(a), en una clnica privada, en un centro de atencin urgente o en una sala de emergencias.  Si tiene Engineer, drilling, por favor llame inmediatamente al 911 o vaya a la sala de emergencias.  Nmeros de bper  - Dr. Hester: [phone]  - Dra. Jackquline: [phone]  - Dr. Claudene: [phone]   En caso de inclemencias del tiempo, por favor llame a landry capes principal al [phone] para una actualizacin sobre el [place] de cualquier retraso o cierre.  Consejos para la medicacin en dermatologa: Por favor, guarde las cajas en las que vienen los medicamentos de uso tpico para ayudarle a seguir las instrucciones sobre dnde y cmo usarlos. Las farmacias generalmente imprimen las instrucciones del medicamento slo en las cajas y no directamente en los tubos del [place].   Si su medicamento es [employer], por favor, pngase en  contacto con landry rieger llamando al [phone] y presione la opcin 4 o envenos un mensaje a travs de Clinical cytogeneticist.   No podemos decirle cul ser su copago por los medicamentos por adelantado ya que esto es diferente dependiendo de la cobertura de su seguro. Sin embargo, es posible que podamos encontrar un medicamento sustituto a Audiological scientist un formulario para que el seguro cubra el medicamento que se considera necesario.   Si se requiere una autorizacin previa para que su compaa de seguros malta su medicamento, por favor permtanos de 1 a 2 das hbiles para completar este proceso.  Los precios de los medicamentos varan con frecuencia dependiendo del Environmental consultant de dnde se surte la receta y alguna farmacias pueden ofrecer precios ms baratos.  El sitio web www.goodrx.com tiene cupones para medicamentos de Health and safety inspector. Los precios aqu no tienen en cuenta lo que podra costar con la ayuda del seguro (puede ser ms barato con su seguro), pero el sitio web puede darle el  precio si no utiliz Tourist information centre manager.  - Puede imprimir el cupn correspondiente y llevarlo con su receta a la farmacia.  - Tambin puede pasar por nuestra oficina durante el horario de atencin regular y Education officer, museum una tarjeta de cupones de GoodRx.  - Si necesita que su receta se enve electrnicamente a una farmacia diferente, informe a nuestra oficina a travs de MyChart de [place] o por telfono llamando al [phone] y presione la opcin 4.

## 2023-06-21 NOTE — Progress Notes (Signed)
   Follow-Up Visit   Subjective  Patty Shelton is a 83 y.o. female who presents for the following: spot at right temple and spot at left temple area she noticed a few months ago that get irritated.    The patient has spots, moles and lesions to be evaluated, some may be new or changing and the patient may have concern these could be cancer.   The following portions of the chart were reviewed this encounter and updated as appropriate: medications, allergies, medical history  Review of Systems:  No other skin or systemic complaints except as noted in HPI or Assessment and Plan.  Objective  Well appearing patient in no apparent distress; mood and affect are within normal limits.    A focused examination was performed of the following areas: Face   Relevant exam findings are noted in the Assessment and Plan.  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 - Observe - Call for any changes  LENTIGINES At face Exam: scattered tan macules Due to sun exposure Treatment Plan: Benign-appearing, observe. Recommend daily broad spectrum sunscreen SPF 30+ to sun-exposed areas, reapply every 2 hours as needed.  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  - Recommend staying in the shade or wearing long sleeves, sun glasses (UVA+UVB protection) and wide brim hats (4-inch brim around the entire circumference of the hat). - Call for new or changing lesions.  INFLAMED SEBORRHEIC KERATOSIS (2) left zygoma x 1, right temporal  hairline x 1 (2) Symptomatic, irritating, patient would like treated. Destruction of lesion - left zygoma x 1, right temporal hairline x 1 (2)  Destruction method: cryotherapy   Informed consent: discussed and consent obtained   Lesion destroyed using liquid nitrogen: Yes   Region frozen until ice ball extended beyond lesion: Yes   Outcome: patient tolerated procedure well with no complications   Post-procedure details: wound care instructions given   Additional details:  Prior to procedure, discussed risks of blister formation, small wound, skin dyspigmentation, or rare scar following cryotherapy. Recommend Vaseline ointment to treated areas while healing.   Return for keep follow up as scheduled in june .  I, Eleanor Blush, CMA, am acting as scribe for Rexene Rattler, MD.   Documentation: I have reviewed the above documentation for accuracy and completeness, and I agree with the above.  Rexene Rattler, MD

## 2023-06-27 ENCOUNTER — Other Ambulatory Visit: Payer: Medicare Other

## 2023-06-29 ENCOUNTER — Other Ambulatory Visit: Payer: Self-pay | Admitting: Family Medicine

## 2023-06-30 NOTE — Telephone Encounter (Signed)
Please schedule CPE with fasting labs prior with Dr. Bedsole.  

## 2023-06-30 NOTE — Telephone Encounter (Signed)
LVM TO C/B AND SCHEDULE.

## 2023-07-04 ENCOUNTER — Encounter: Payer: Self-pay | Admitting: Family Medicine

## 2023-07-04 ENCOUNTER — Ambulatory Visit (INDEPENDENT_AMBULATORY_CARE_PROVIDER_SITE_OTHER): Payer: Medicare Other | Admitting: Family Medicine

## 2023-07-04 VITALS — BP 136/60 | HR 81 | Temp 98.2°F | Ht 65.0 in | Wt 173.0 lb

## 2023-07-04 DIAGNOSIS — I10 Essential (primary) hypertension: Secondary | ICD-10-CM

## 2023-07-04 DIAGNOSIS — M109 Gout, unspecified: Secondary | ICD-10-CM | POA: Diagnosis not present

## 2023-07-04 DIAGNOSIS — J479 Bronchiectasis, uncomplicated: Secondary | ICD-10-CM | POA: Diagnosis not present

## 2023-07-04 DIAGNOSIS — J849 Interstitial pulmonary disease, unspecified: Secondary | ICD-10-CM

## 2023-07-04 DIAGNOSIS — E785 Hyperlipidemia, unspecified: Secondary | ICD-10-CM | POA: Diagnosis not present

## 2023-07-04 DIAGNOSIS — N1832 Chronic kidney disease, stage 3b: Secondary | ICD-10-CM

## 2023-07-04 DIAGNOSIS — I700 Atherosclerosis of aorta: Secondary | ICD-10-CM

## 2023-07-04 LAB — COMPREHENSIVE METABOLIC PANEL
ALT: 15 U/L (ref 0–35)
AST: 18 U/L (ref 0–37)
Albumin: 4.3 g/dL (ref 3.5–5.2)
Alkaline Phosphatase: 56 U/L (ref 39–117)
BUN: 18 mg/dL (ref 6–23)
CO2: 27 meq/L (ref 19–32)
Calcium: 9.1 mg/dL (ref 8.4–10.5)
Chloride: 100 meq/L (ref 96–112)
Creatinine, Ser: 0.98 mg/dL (ref 0.40–1.20)
GFR: 53.53 mL/min — ABNORMAL LOW (ref >60.00)
Glucose, Bld: 93 mg/dL (ref 70–99)
Potassium: 4.1 meq/L (ref 3.5–5.1)
Sodium: 136 meq/L (ref 135–145)
Total Bilirubin: 0.4 mg/dL (ref 0.2–1.2)
Total Protein: 6.9 g/dL (ref 6.0–8.3)

## 2023-07-04 LAB — LIPID PANEL
Cholesterol: 167 mg/dL (ref 0–200)
HDL: 57.9 mg/dL (ref >39.00)
LDL Cholesterol: 87 mg/dL (ref 0–99)
NonHDL: 109.32
Total CHOL/HDL Ratio: 3
Triglycerides: 111 mg/dL (ref 0.0–149.0)
VLDL: 22.2 mg/dL (ref 0.0–40.0)

## 2023-07-04 LAB — URIC ACID: Uric Acid, Serum: 6 mg/dL (ref 2.4–7.0)

## 2023-07-04 NOTE — Assessment & Plan Note (Signed)
Medical management with statin

## 2023-07-04 NOTE — Progress Notes (Signed)
Patient ID: Patty Shelton, female    DOB: 03-16-1941, 83 y.o.   MRN: [id number]  This visit was conducted in person.  BP 136/60 (BP Location: Left Arm, Patient Position: Sitting, Cuff Size: Large)   Pulse 81   Temp 98.2 F (36.8 C) (Temporal)   Ht 5\' 5"  (1.651 m)   Wt 173 lb (78.5 kg)   SpO2 96%   BMI 28.79 kg/m    CC:  Chief Complaint  Patient presents with   Annual Exam    Part 2 (MWV 10/11/2022)    Subjective:   HPI: Patty Shelton is a 83 y.o. female presenting on 07/04/2023 for  review of chronic health problems. He/She also has the following acute concerns today:  The patient saw a LPN or RN for medicare wellness visit. 4./30/2024  Prevention and wellness was reviewed in detail. Note reviewed and important notes copied below.   Hypertension:   Well controlled  on current meds. Hydrochlorothiazide, losartan BP Readings from Last 3 Encounters:  07/04/23 136/60  05/30/23 (!) 153/74  05/03/23 132/76  Using medication without problems or lightheadedness:  none Edema:none  No chest pain Short of breath: yes, mild Average home BPs: Other issues:  Elevated Cholesterol: HX of CAD, goal LDL < 70 Lab Results  Component Value Date   CHOL 162 06/09/2022   HDL 59.00 06/09/2022   LDLCALC 89 06/09/2022   LDLDIRECT 180.6 04/04/2011   TRIG 70.0 06/09/2022   CHOLHDL 3 06/09/2022  Using medications without problems: Muscle aches:  Diet compliance: good, room for improvement. Exercise:  none Other complaints:  CKD  Interstitial lung disease/ bronchiectasis: Followed by pulmonary.    Rarely needing albuterol prn.  Gout , no flares in the last year. Lab Results  Component Value Date   LABURIC 6.7 06/09/2022       Wt Readings from Last 3 Encounters:  07/04/23 173 lb (78.5 kg)  05/03/23 171 lb 3.2 oz (77.7 kg)  04/26/23 180 lb (81.6 kg)  Body mass index is 28.79 kg/m.    Relevant past medical, surgical, family and social history reviewed and updated as  indicated. Interim medical history since our last visit reviewed. Allergies and medications reviewed and updated. Outpatient Medications Prior to Visit  Medication Sig Dispense Refill   acetaminophen (TYLENOL) 500 MG tablet Take 500 mg by mouth every 6 (six) hours as needed.     albuterol (VENTOLIN HFA) 108 (90 Base) MCG/ACT inhaler Inhale 2 puffs into the lungs every 4 (four) hours as needed for wheezing or shortness of breath. 8 g 10   Biotin 10 MG TABS Take 1 tablet by mouth daily.     cetirizine (ZYRTEC) 10 MG tablet Take 10 mg by mouth daily as needed for allergies.     Cholecalciferol (VITAMIN D3) 50 MCG (2000 UT) TABS Take 1 tablet by mouth daily.     CRANBERRY PO Take 1 capsule by mouth daily.     diclofenac sodium (VOLTAREN) 1 % GEL Apply 4 g topically 4 (four) times daily as needed.     famciclovir (FAMVIR) 250 MG tablet TAKE 1 TABLET DAILY 90 tablet 3   fluticasone (FLONASE) 50 MCG/ACT nasal spray Place 2 sprays into both nostrils as needed.      fluticasone-salmeterol (ADVAIR HFA) 115-21 MCG/ACT inhaler USE 2 INHALATIONS TWICE A DAY 3 each 2   hydrochlorothiazide (HYDRODIURIL) 25 MG tablet TAKE ONE-HALF (1/2) TABLET DAILY 45 tablet 3   losartan (COZAAR) 100 MG tablet TAKE 1  TABLET DAILY 90 tablet 0   meloxicam (MOBIC) 15 MG tablet Take 15 mg by mouth daily.     methocarbamol (ROBAXIN) 500 MG tablet Take 500 mg by mouth 3 (three) times daily.     Multiple Minerals-Vitamins (CAL-MAG-ZINC-D PO) Take 1 tablet by mouth daily.     Respiratory Therapy Supplies (FLUTTER) DEVI 1 Device by Does not apply route as directed. 1 each 0   rosuvastatin (CRESTOR) 10 MG tablet TAKE 1 TABLET DAILY 90 tablet 0   vitamin B-12 (CYANOCOBALAMIN) 1000 MCG tablet Take 1,000 mcg by mouth daily.     vitamin E 180 MG (400 UNITS) capsule Take 400 Units by mouth daily.     azithromycin (ZITHROMAX) 250 MG tablet Take 1 tablet (250 mg total) by mouth daily. Take first 2 tablets together, then 1 every day until  finished. 6 tablet 0   benzonatate (TESSALON) 100 MG capsule Take 1 capsule (100 mg total) by mouth every 8 (eight) hours. 21 capsule 0   predniSONE (DELTASONE) 20 MG tablet Take 2 tablets (40 mg total) by mouth daily. 10 tablet 0   No facility-administered medications prior to visit.     Per HPI unless specifically indicated in ROS section below Review of Systems  Constitutional:  Negative for fatigue and fever.  HENT:  Negative for congestion.   Eyes:  Negative for pain.  Respiratory:  Negative for cough and shortness of breath.   Cardiovascular:  Negative for chest pain, palpitations and leg swelling.  Gastrointestinal:  Negative for abdominal pain and blood in stool.       GERD  Genitourinary:  Negative for dysuria and vaginal bleeding.  Musculoskeletal:  Negative for back pain.  Neurological:  Negative for syncope, light-headedness and headaches.  Psychiatric/Behavioral:  Negative for dysphoric mood.    Objective:  BP 136/60 (BP Location: Left Arm, Patient Position: Sitting, Cuff Size: Large)   Pulse 81   Temp 98.2 F (36.8 C) (Temporal)   Ht 5\' 5"  (1.651 m)   Wt 173 lb (78.5 kg)   SpO2 96%   BMI 28.79 kg/m   Wt Readings from Last 3 Encounters:  07/04/23 173 lb (78.5 kg)  05/03/23 171 lb 3.2 oz (77.7 kg)  04/26/23 180 lb (81.6 kg)      Physical Exam Constitutional:      General: She is not in acute distress.    Appearance: Normal appearance. She is well-developed. She is not ill-appearing or toxic-appearing.  HENT:     Head: Normocephalic.     Right Ear: Hearing, tympanic membrane, ear canal and external ear normal. Tympanic membrane is not erythematous, retracted or bulging.     Left Ear: Hearing, tympanic membrane, ear canal and external ear normal. Tympanic membrane is not erythematous, retracted or bulging.     Nose: No mucosal edema or rhinorrhea.     Right Sinus: No maxillary sinus tenderness or frontal sinus tenderness.     Left Sinus: No maxillary sinus  tenderness or frontal sinus tenderness.     Mouth/Throat:     Pharynx: Uvula midline.  Eyes:     General: Lids are normal. Lids are everted, no foreign bodies appreciated.     Conjunctiva/sclera: Conjunctivae normal.     Pupils: Pupils are equal, round, and reactive to light.  Neck:     Thyroid: No thyroid mass or thyromegaly.     Vascular: No carotid bruit.     Trachea: Trachea normal.  Cardiovascular:     Rate and Rhythm:  Normal rate and regular rhythm.     Pulses: Normal pulses.     Heart sounds: Normal heart sounds, S1 normal and S2 normal. No murmur heard.    No friction rub. No gallop.  Pulmonary:     Effort: Pulmonary effort is normal. No tachypnea or respiratory distress.     Breath sounds: Normal breath sounds. No decreased breath sounds, wheezing, rhonchi or rales.  Abdominal:     General: Bowel sounds are normal.     Palpations: Abdomen is soft.     Tenderness: There is no abdominal tenderness.  Musculoskeletal:     Cervical back: Normal range of motion and neck supple.  Skin:    General: Skin is warm and dry.     Findings: No rash.  Neurological:     Mental Status: She is alert.  Psychiatric:        Mood and Affect: Mood is not anxious or depressed.        Speech: Speech normal.        Behavior: Behavior normal. Behavior is cooperative.        Thought Content: Thought content normal.        Judgment: Judgment normal.       Results for orders placed or performed in visit on 11/16/22  Comprehensive metabolic panel   Collection Time: 11/16/22 10:39 AM  Result Value Ref Range   Sodium 135 135 - 145 mEq/L   Potassium 3.9 3.5 - 5.1 mEq/L   Chloride 98 96 - 112 mEq/L   CO2 23 19 - 32 mEq/L   Glucose, Bld 120 (H) 70 - 99 mg/dL   BUN 19 6 - 23 mg/dL   Creatinine, Ser 1.19 0.40 - 1.20 mg/dL   Total Bilirubin 0.4 0.2 - 1.2 mg/dL   Alkaline Phosphatase 65 39 - 117 U/L   AST 19 0 - 37 U/L   ALT 15 0 - 35 U/L   Total Protein 7.2 6.0 - 8.3 g/dL   Albumin 4.3 3.5 -  5.2 g/dL   GFR 14.78 (L) >29.56 mL/min   Calcium 9.3 8.4 - 10.5 mg/dL  CBC with Differential/Platelet   Collection Time: 11/16/22 10:39 AM  Result Value Ref Range   WBC 6.5 4.0 - 10.5 K/uL   RBC 4.24 3.87 - 5.11 Mil/uL   Hemoglobin 13.2 12.0 - 15.0 g/dL   HCT 21.3 08.6 - 57.8 %   MCV 92.4 78.0 - 100.0 fl   MCHC 33.8 30.0 - 36.0 g/dL   RDW 46.9 62.9 - 52.8 %   Platelets 253.0 150.0 - 400.0 K/uL   Neutrophils Relative % 63.1 43.0 - 77.0 %   Lymphocytes Relative 26.8 12.0 - 46.0 %   Monocytes Relative 7.3 3.0 - 12.0 %   Eosinophils Relative 2.3 0.0 - 5.0 %   Basophils Relative 0.5 0.0 - 3.0 %   Neutro Abs 4.1 1.4 - 7.7 K/uL   Lymphs Abs 1.7 0.7 - 4.0 K/uL   Monocytes Absolute 0.5 0.1 - 1.0 K/uL   Eosinophils Absolute 0.1 0.0 - 0.7 K/uL   Basophils Absolute 0.0 0.0 - 0.1 K/uL  TSH   Collection Time: 11/16/22 10:39 AM  Result Value Ref Range   TSH 3.77 0.35 - 5.50 uIU/mL  Vitamin B12   Collection Time: 11/16/22 10:39 AM  Result Value Ref Range   Vitamin B-12 >1500 (H) 211 - 911 pg/mL  VITAMIN D 25 Hydroxy (Vit-D Deficiency, Fractures)   Collection Time: 11/16/22 10:39 AM  Result Value  Ref Range   VITD 53.41 30.00 - 100.00 ng/mL     COVID 19 screen:  No recent travel or known exposure to COVID19 The patient denies respiratory symptoms of COVID 19 at this time. The importance of social distancing was discussed today.   Assessment and Plan   The patient's preventative maintenance and recommended screening tests for an annual wellness exam were reviewed in full today. Brought up to date unless services declined.  Counselled on the importance of diet, exercise, and its role in overall health and mortality. The patient's FH and SH was reviewed, including their home life, tobacco status, and drug and alcohol status.   Vaccines: Consider COVID, shingles, tetanus, RSV at pharmacy.  Refused flu vaccine. Pap/DVE: not indicated  Mammo: 07/08/2022 nml, repeat in 1 year Bone  Density: 09/04/2019 stable osteopenia, repeat in 5 years Colon: Not indicated Smoking Status: Non-smoker ETOH/ drug use: None/none  Hep C: Done  HIV screen:   none  Problem List Items Addressed This Visit     Bronchiectasis without complication (HCC)   Followed by pulmonary.  Treated with flutter valve and steroid inhaler.      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 daily  History of CAD, goal LDL less than 70      Relevant Orders   Comprehensive metabolic panel   Lipid panel   ILD (interstitial lung disease) (HCC)   Chronic, followed by pulmonary       Primary hypertension (Chronic)   Chronic, well-controlled  HCTZ 12.5 mg daily Losartan 100 mg daily      Thoracic aortic atherosclerosis (HCC)-seen on CT (Chronic)   Medical management with statin      Other Visit Diagnoses       Acute gout involving toe of right foot, unspecified cause       Relevant Medications   methocarbamol (ROBAXIN) 500 MG tablet   meloxicam (MOBIC) 15 MG tablet   Other Relevant Orders   Uric Acid          Kerby Nora, MD

## 2023-07-04 NOTE — Assessment & Plan Note (Signed)
Chronic, followed by pulmonary.

## 2023-07-04 NOTE — Assessment & Plan Note (Signed)
Chronic, well-controlled  HCTZ 12.5 mg daily Losartan 100 mg daily

## 2023-07-04 NOTE — Patient Instructions (Addendum)
Ms. Heady , Thank you for taking time to come for your Visit. I appreciate your ongoing commitment to your health goals. Please review the following plan we discussed and let me know if I can assist you in the future.   These are the goals we discussed:  This is a list of the screening recommended for you and due dates:  Health Maintenance  Topic Date Due   COVID-19 Vaccine (1) Never done   Zoster (Shingles) Vaccine (1 of 2) 05/28/1960   DTaP/Tdap/Td vaccine (2 - Td or Tdap) 03/10/2021   Flu Shot  09/11/2023*   Mammogram  07/09/2023   Medicare Annual Wellness Visit  10/11/2023   DEXA scan (bone density measurement)  09/03/2024   Pneumonia Vaccine  Completed   HPV Vaccine  Aged Out  *Topic was postponed. The date shown is not the original due date.     Please call the location of your choice from the menu below to schedule your Mammogram and/or Bone Density appointment.    [facility]   [facility]                      Phone:  [phone] [address]                               [place], [place] [address]                                                             Services: Traditional and 3D Mammogram, Bone Density   [facility]                 Phone: [phone] [address]                                                       [place], [place] [address]    Service: Bone Density ONLY   *this site does NOT perform mammograms  [facility]                        Phone:  [phone] [address]                                  [place], [place] [address]                                            Services:  3D Mammogram and Bone Density    [facility]   Phone:  [phone]   [address]  [place], [place] [address]                                             Services: 3D Mammogram and Bone Density  [facility])  Phone:  [phone]   [address]                        [place], [place] [address]                                              Services:  3D Mammogram and Bone Density

## 2023-07-04 NOTE — Assessment & Plan Note (Signed)
Followed by pulmonary.  Treated with flutter valve and steroid inhaler.

## 2023-07-04 NOTE — Assessment & Plan Note (Signed)
Due for reevaluation 

## 2023-07-04 NOTE — Assessment & Plan Note (Addendum)
Due for reevaluation  On Crestor 10 mg p.o. daily  History of CAD, goal LDL less than 70

## 2023-07-05 ENCOUNTER — Encounter: Payer: Self-pay | Admitting: *Deleted

## 2023-07-11 ENCOUNTER — Other Ambulatory Visit: Payer: Self-pay | Admitting: Family Medicine

## 2023-07-11 DIAGNOSIS — Z1231 Encounter for screening mammogram for malignant neoplasm of breast: Secondary | ICD-10-CM

## 2023-07-19 ENCOUNTER — Ambulatory Visit
Admission: RE | Admit: 2023-07-19 | Discharge: 2023-07-19 | Disposition: A | Discharge status: Home / Self Care | Payer: Medicare Other | Source: Ambulatory Visit | Attending: Family Medicine | Admitting: Family Medicine

## 2023-07-19 ENCOUNTER — Other Ambulatory Visit: Payer: Self-pay | Admitting: Family Medicine

## 2023-07-19 DIAGNOSIS — N644 Mastodynia: Secondary | ICD-10-CM

## 2023-07-19 DIAGNOSIS — Z1231 Encounter for screening mammogram for malignant neoplasm of breast: Secondary | ICD-10-CM | POA: Insufficient documentation

## 2023-07-25 ENCOUNTER — Other Ambulatory Visit: Payer: Self-pay | Admitting: Family Medicine

## 2023-07-25 ENCOUNTER — Encounter: Payer: Self-pay | Admitting: Family Medicine

## 2023-07-25 ENCOUNTER — Ambulatory Visit (INDEPENDENT_AMBULATORY_CARE_PROVIDER_SITE_OTHER): Payer: Medicare Other | Admitting: Family Medicine

## 2023-07-25 VITALS — BP 140/60 | HR 79 | Temp 98.2°F | Ht 65.0 in | Wt 172.4 lb

## 2023-07-25 DIAGNOSIS — R051 Acute cough: Secondary | ICD-10-CM | POA: Diagnosis not present

## 2023-07-25 DIAGNOSIS — J849 Interstitial pulmonary disease, unspecified: Secondary | ICD-10-CM

## 2023-07-25 DIAGNOSIS — Z20828 Contact with and (suspected) exposure to other viral communicable diseases: Secondary | ICD-10-CM | POA: Diagnosis not present

## 2023-07-25 DIAGNOSIS — J101 Influenza due to other identified influenza virus with other respiratory manifestations: Secondary | ICD-10-CM | POA: Diagnosis not present

## 2023-07-25 DIAGNOSIS — J4531 Mild persistent asthma with (acute) exacerbation: Secondary | ICD-10-CM

## 2023-07-25 LAB — POC COVID19 BINAXNOW: SARS Coronavirus 2 Ag: NEGATIVE

## 2023-07-25 LAB — POC INFLUENZA A&B (BINAX/QUICKVUE)
Influenza A, POC: POSITIVE — AB
Influenza B, POC: NEGATIVE

## 2023-07-25 MED ORDER — OSELTAMIVIR PHOSPHATE 75 MG PO CAPS: 75.0000 mg | ORAL_CAPSULE | Freq: Two times a day (BID) | ORAL | 0 refills | Status: DC

## 2023-07-25 MED ORDER — FLUTICASONE-SALMETEROL 115-21 MCG/ACT IN AERO: INHALATION_SPRAY | RESPIRATORY_TRACT | 2 refills | Status: AC

## 2023-07-25 MED ORDER — PREDNISONE 20 MG PO TABS: ORAL_TABLET | ORAL | 0 refills | Status: DC

## 2023-07-25 NOTE — Telephone Encounter (Signed)
Copied from CRM [id number]. Topic: Clinical - Medication Refill >> Jul 25, 2023  1:27 PM Aletta Edouard wrote: Most Recent Primary Care Visit:  Provider: Kerby Nora E  Department: [facility]  Visit Type: PHYSICAL  Date: 07/04/2023  Medication:  Advair   Has the patient contacted their pharmacy? No (Agent: If no, request that the patient contact the pharmacy for the refill. If patient does not wish to contact the pharmacy document the reason why and proceed with request.) (Agent: If yes, when and what did the pharmacy advise?)  Is this the correct pharmacy for this prescription? Yes If no, delete pharmacy and type the correct one.  This is the patient's preferred pharmacy:  [id number] [place], [place] - [address] AT [facility] [address] [place] [address] Phone: [phone] Fax: [phone]  [facility] - [place], [place] - [address] [place] [address] Phone: [phone] Fax: [phone]   Has the prescription been filled recently? No  Is the patient out of the medication? Yes  Has the patient been seen for an appointment in the last year OR does the patient have an upcoming appointment? Yes  Can we respond through MyChart? No  Agent: Please be advised that Rx refills may take up to 3 business days. We ask that you follow-up with your pharmacy.

## 2023-07-25 NOTE — Progress Notes (Signed)
Patient ID: Patty Shelton, female    DOB: [birth year], 83 y.o.   MRN: [id number]  This visit was conducted in person.  BP (!) 140/60 (BP Location: Left Arm, Patient Position: Sitting, Cuff Size: Large)   Pulse 79   Temp 98.2 F (36.8 C) (Temporal)   Ht 5\' 5"  (1.651 m)   Wt 172 lb 6 oz (78.2 kg)   SpO2 96%   BMI 28.68 kg/m    CC:  Chief Complaint  Patient presents with   Cough    Symptoms started last Thurs/Fri Husband had the Flu   Fatigue         Subjective:   HPI: Patty Shelton is a 83 y.o. female presenting on 07/25/2023 for Cough (Symptoms started last Thurs/Fri/Husband had the Flu) and Fatigue (/)   Date of onset:  4 days Initial symptoms included  sneeze, runny nose, no fever Symptoms progressed to fatigue,  productive cough, cough interupting sleep rare;y.Marland Kitchen able to sleep.  Mild SOB.  No wheeze.  Acute non-smoker.       Relevant past medical, surgical, family and social history reviewed and updated as indicated. Interim medical history since our last visit reviewed. Allergies and medications reviewed and updated. Outpatient Medications Prior to Visit  Medication Sig Dispense Refill   acetaminophen (TYLENOL) 500 MG tablet Take 500 mg by mouth every 6 (six) hours as needed.     albuterol (VENTOLIN HFA) 108 (90 Base) MCG/ACT inhaler Inhale 2 puffs into the lungs every 4 (four) hours as needed for wheezing or shortness of breath. 8 g 10   Biotin 10 MG TABS Take 1 tablet by mouth daily.     cetirizine (ZYRTEC) 10 MG tablet Take 10 mg by mouth daily as needed for allergies.     Cholecalciferol (VITAMIN D3) 50 MCG (2000 UT) TABS Take 1 tablet by mouth daily.     CRANBERRY PO Take 1 capsule by mouth daily.     diclofenac sodium (VOLTAREN) 1 % GEL Apply 4 g topically 4 (four) times daily as needed.     famciclovir (FAMVIR) 250 MG tablet TAKE 1 TABLET DAILY 90 tablet 3   fluticasone (FLONASE) 50 MCG/ACT nasal spray Place 2 sprays into both nostrils as needed.       fluticasone-salmeterol (ADVAIR HFA) 115-21 MCG/ACT inhaler USE 2 INHALATIONS TWICE A DAY 3 each 2   hydrochlorothiazide (HYDRODIURIL) 25 MG tablet TAKE ONE-HALF (1/2) TABLET DAILY 45 tablet 3   losartan (COZAAR) 100 MG tablet TAKE 1 TABLET DAILY 90 tablet 0   meloxicam (MOBIC) 15 MG tablet Take 15 mg by mouth daily.     methocarbamol (ROBAXIN) 500 MG tablet Take 500 mg by mouth 3 (three) times daily.     Multiple Minerals-Vitamins (CAL-MAG-ZINC-D PO) Take 1 tablet by mouth daily.     Respiratory Therapy Supplies (FLUTTER) DEVI 1 Device by Does not apply route as directed. 1 each 0   rosuvastatin (CRESTOR) 10 MG tablet TAKE 1 TABLET DAILY 90 tablet 0   vitamin B-12 (CYANOCOBALAMIN) 1000 MCG tablet Take 1,000 mcg by mouth daily.     vitamin E 180 MG (400 UNITS) capsule Take 400 Units by mouth daily.     No facility-administered medications prior to visit.     Per HPI unless specifically indicated in ROS section below Review of Systems  Constitutional:  Positive for fatigue. Negative for fever.  HENT:  Positive for congestion.   Eyes:  Negative for pain.  Respiratory:  Positive  for cough, shortness of breath and wheezing.   Cardiovascular:  Negative for chest pain, palpitations and leg swelling.  Gastrointestinal:  Negative for abdominal pain.  Genitourinary:  Negative for dysuria and vaginal bleeding.  Musculoskeletal:  Negative for back pain.  Neurological:  Negative for syncope, light-headedness and headaches.  Psychiatric/Behavioral:  Negative for dysphoric mood.    Objective:  BP (!) 140/60 (BP Location: Left Arm, Patient Position: Sitting, Cuff Size: Large)   Pulse 79   Temp 98.2 F (36.8 C) (Temporal)   Ht 5\' 5"  (1.651 m)   Wt 172 lb 6 oz (78.2 kg)   SpO2 96%   BMI 28.68 kg/m   Wt Readings from Last 3 Encounters:  07/25/23 172 lb 6 oz (78.2 kg)  07/04/23 173 lb (78.5 kg)  05/03/23 171 lb 3.2 oz (77.7 kg)      Physical Exam Constitutional:      General: She is not in  acute distress.    Appearance: She is well-developed. She is not ill-appearing or toxic-appearing.  HENT:     Head: Normocephalic.     Right Ear: Hearing, tympanic membrane, ear canal and external ear normal. Tympanic membrane is not erythematous, retracted or bulging.     Left Ear: Hearing, tympanic membrane, ear canal and external ear normal. Tympanic membrane is not erythematous, retracted or bulging.     Nose: Mucosal edema and rhinorrhea present.     Right Sinus: No maxillary sinus tenderness or frontal sinus tenderness.     Left Sinus: No maxillary sinus tenderness or frontal sinus tenderness.     Mouth/Throat:     Pharynx: Uvula midline.  Eyes:     General: Lids are normal. Lids are everted, no foreign bodies appreciated.     Conjunctiva/sclera: Conjunctivae normal.     Pupils: Pupils are equal, round, and reactive to light.  Neck:     Thyroid: No thyroid mass or thyromegaly.     Vascular: No carotid bruit.     Trachea: Trachea normal.  Cardiovascular:     Rate and Rhythm: Normal rate and regular rhythm.     Pulses: Normal pulses.     Heart sounds: Normal heart sounds, S1 normal and S2 normal. No murmur heard.    No friction rub. No gallop.  Pulmonary:     Effort: Pulmonary effort is normal. No tachypnea or respiratory distress.     Breath sounds: Examination of the right-upper field reveals rhonchi. Examination of the left-upper field reveals rhonchi. Examination of the right-middle field reveals rhonchi. Examination of the left-middle field reveals rhonchi. Examination of the right-lower field reveals rhonchi. Examination of the left-lower field reveals rhonchi. Wheezing and rhonchi present. No decreased breath sounds or rales.  Musculoskeletal:     Cervical back: Normal range of motion and neck supple.  Skin:    General: Skin is warm and dry.     Findings: No rash.  Neurological:     Mental Status: She is alert.  Psychiatric:        Mood and Affect: Mood is not anxious or  depressed.        Speech: Speech normal.        Behavior: Behavior normal. Behavior is cooperative.        Judgment: Judgment normal.       Results for orders placed or performed in visit on 07/25/23  POC Influenza A&B (Binax test)   Collection Time: 07/25/23 10:38 AM  Result Value Ref Range   Influenza A, POC  Positive (A) Negative   Influenza B, POC Negative Negative  POC COVID-19   Collection Time: 07/25/23 10:38 AM  Result Value Ref Range   SARS Coronavirus 2 Ag Negative Negative    Assessment and Plan  Influenza A Assessment & Plan: Acute, high risk individual with history of interstitial lung disease/bronchiectasis. No red flags or current indication for hospitalization.  No hypoxia. Recommend starting Tamiflu 75 mg p.o. twice daily x 5 days.  Symptomatic and supportive care discussed.  Recommended rest, fluids and encouraging p.o. intake. I encouraged her to restart her Advair 2 inhalations twice daily.  She has albuterol to use as needed for wheeze and shortness of breath. Given some amount of shortness of breath and chronic lung disease history I will treat her with course of prednisone 7-day taper, 60, 40, 20.  Return and ER precautions provided.   Acute cough -     POC Influenza A&B(BINAX/QUICKVUE) -     POC COVID-19 BinaxNow  Exposure to the flu -     POC Influenza A&B(BINAX/QUICKVUE)  ILD (interstitial lung disease) (HCC) Assessment & Plan: Chronic, encouraged patient to restart Advair 2 inhalations twice daily as well as to use albuterol as needed.   Other orders -     Oseltamivir Phosphate; Take 1 capsule (75 mg total) by mouth 2 (two) times daily.  Dispense: 10 capsule; Refill: 0 -     predniSONE; 3 tabs by mouth daily x 3 days, then 2 tabs by mouth daily x 2 days then 1 tab by mouth daily x 2 days  Dispense: 15 tablet; Refill: 0    No follow-ups on file.   Kerby Nora, MD

## 2023-07-25 NOTE — Assessment & Plan Note (Signed)
Chronic, encouraged patient to restart Advair 2 inhalations twice daily as well as to use albuterol as needed.

## 2023-07-25 NOTE — Patient Instructions (Addendum)
Start back on Advair 2 inhalations twice daily at least while sick.  You can use albuterol for rescue if you are having a coughing fit, shortness of breath or wheezing.Marland Kitchen every 4-6 hours.  Complete tamiflu course x 5 days.  USe prednisone taper  for wheeze.   If have severe shortness or breath go to ER.

## 2023-07-25 NOTE — Telephone Encounter (Signed)
Looks like this was prescribed by pulmonology.  Ok to refill or do we need to send to Dr. Belia Heman to refill?

## 2023-07-25 NOTE — Assessment & Plan Note (Signed)
Acute, high risk individual with history of interstitial lung disease/bronchiectasis. No red flags or current indication for hospitalization.  No hypoxia. Recommend starting Tamiflu 75 mg p.o. twice daily x 5 days.  Symptomatic and supportive care discussed.  Recommended rest, fluids and encouraging p.o. intake. I encouraged her to restart her Advair 2 inhalations twice daily.  She has albuterol to use as needed for wheeze and shortness of breath. Given some amount of shortness of breath and chronic lung disease history I will treat her with course of prednisone 7-day taper, 60, 40, 20.  Return and ER precautions provided.

## 2023-07-25 NOTE — Addendum Note (Signed)
Addended by: Damita Lack on: 07/25/2023 03:19 PM   Modules accepted: Orders

## 2023-08-01 ENCOUNTER — Other Ambulatory Visit: Payer: Self-pay

## 2023-08-01 ENCOUNTER — Ambulatory Visit: Payer: Self-pay | Admitting: Family Medicine

## 2023-08-01 ENCOUNTER — Emergency Department
Admission: EM | Admit: 2023-08-01 | Discharge: 2023-08-01 | Disposition: A | Discharge status: Home / Self Care | Payer: Medicare Other | Attending: Emergency Medicine | Admitting: Emergency Medicine

## 2023-08-01 ENCOUNTER — Emergency Department: Payer: Medicare Other

## 2023-08-01 DIAGNOSIS — I10 Essential (primary) hypertension: Secondary | ICD-10-CM | POA: Insufficient documentation

## 2023-08-01 DIAGNOSIS — R0789 Other chest pain: Secondary | ICD-10-CM | POA: Diagnosis not present

## 2023-08-01 DIAGNOSIS — R0602 Shortness of breath: Secondary | ICD-10-CM | POA: Diagnosis not present

## 2023-08-01 DIAGNOSIS — R079 Chest pain, unspecified: Secondary | ICD-10-CM | POA: Diagnosis not present

## 2023-08-01 LAB — CBC
HCT: 36.4 % (ref 36.0–46.0)
Hemoglobin: 12.7 g/dL (ref 12.0–15.0)
MCH: 31.5 pg (ref 26.0–34.0)
MCHC: 34.9 g/dL (ref 30.0–36.0)
MCV: 90.3 fL (ref 80.0–100.0)
Platelets: 193 K/uL (ref 150–400)
RBC: 4.03 MIL/uL (ref 3.87–5.11)
RDW: 12 % (ref 11.5–15.5)
WBC: 5.9 K/uL (ref 4.0–10.5)
nRBC: 0 % (ref 0.0–0.2)

## 2023-08-01 LAB — BASIC METABOLIC PANEL WITH GFR
Anion gap: 10 (ref 5–15)
BUN: 21 mg/dL (ref 8–23)
CO2: 22 mmol/L (ref 22–32)
Calcium: 8.8 mg/dL — ABNORMAL LOW (ref 8.9–10.3)
Chloride: 99 mmol/L (ref 98–111)
Creatinine, Ser: 1.05 mg/dL — ABNORMAL HIGH (ref 0.44–1.00)
GFR, Estimated: 53 mL/min — ABNORMAL LOW
Glucose, Bld: 117 mg/dL — ABNORMAL HIGH (ref 70–99)
Potassium: 3.8 mmol/L (ref 3.5–5.1)
Sodium: 131 mmol/L — ABNORMAL LOW (ref 135–145)

## 2023-08-01 LAB — TROPONIN I (HIGH SENSITIVITY)
Troponin I (High Sensitivity): 5 ng/L (ref <18)
Troponin I (High Sensitivity): 6 ng/L (ref <18)

## 2023-08-01 NOTE — ED Provider Notes (Signed)
[facility] Provider Note    Event Date/Time   First MD Initiated Contact with Patient 08/01/23 1754     (approximate)   History   Chest Pain   HPI  Patty Shelton is a 83 y.o. female with history of hyperlipidemia, hypertension, presenting with chest pain and shortness of breath yesterday.  Also noted some congestion and cough recently.  No fever, nausea, vomiting, diarrhea, leg swelling.  She denies any history of malignancy, no unilateral calf pain or tenderness, no recent travel or surgeries, no hormone use.  No history of DVTs.  Per husband patient has no cardiologist.  She had told triage that her chest pain started today but she states that it was actually yesterday.  No symptoms now.  On independent chart review she was seen by her primary care doctor on 11 February, was diagnosed with influenza A.  Has history of initial lung disease and bronchiectasis.     Physical Exam   Triage Vital Signs: ED Triage Vitals  Encounter Vitals Group     BP 08/01/23 1346 (!) 154/85     Systolic BP Percentile --      Diastolic BP Percentile --      Pulse Rate 08/01/23 1346 82     Resp 08/01/23 1346 17     Temp 08/01/23 1346 98.2 F (36.8 C)     Temp Source 08/01/23 1346 Oral     SpO2 08/01/23 1346 95 %     Weight 08/01/23 1344 172 lb 6.4 oz (78.2 kg)     Height 08/01/23 1344 5\' 5"  (1.651 m)     Head Circumference --      Peak Flow --      Pain Score 08/01/23 1344 4     Pain Loc --      Pain Education --      Exclude from Growth Chart --     Most recent vital signs: Vitals:   08/01/23 1346 08/01/23 1842  BP: (!) 154/85 (!) 150/86  Pulse: 82 72  Resp: 17 18  Temp: 98.2 F (36.8 C) 98.1 F (36.7 C)  SpO2: 95% 96%     General: Awake, no distress.  CV:  Good peripheral perfusion.  Resp:  Normal effort.  Clear Abd:  No distention.  Soft nontender Other:  No lower extremity edema, no unilateral calf swelling or tenderness.   ED Results /  Procedures / Treatments   Labs (all labs ordered are listed, but only abnormal results are displayed) Labs Reviewed  BASIC METABOLIC PANEL - Abnormal; Notable for the following components:      Result Value   Sodium 131 (*)    Glucose, Bld 117 (*)    Creatinine, Ser 1.05 (*)    Calcium 8.8 (*)    GFR, Estimated 53 (*)    All other components within normal limits  CBC  TROPONIN I (HIGH SENSITIVITY)  TROPONIN I (HIGH SENSITIVITY)     EKG  Sinus rhythm, rate 78, normal QRS, normal QTc, T wave flattening in aVL, no ischemic ST elevation, not significant change compared to prior   RADIOLOGY Chest x-ray on my interpretation without focal consolidation   PROCEDURES:  Critical Care performed: No  Procedures   MEDICATIONS ORDERED IN ED: Medications - No data to display   IMPRESSION / MDM / ASSESSMENT AND PLAN / ED COURSE  I reviewed the triage vital signs and the nursing notes.  Differential diagnosis includes, but is not limited to, ACS, angina, considered CHF but she does not appear fluid overloaded loaded on exam, considered PE but she has no other risk factors for it, is not hypoxic or tachycardic in the emergency department, considered viral illness, pneumonia, post flu sequelae.  Labs, EKG, troponin were obtained out of triage, she has a mild hyponatremia but otherwise electrolytes are not severely deranged, no leukocytosis, her initial troponin is negative.  Her chest x-ray is without focal consolidation, EKG is nonischemic.  Patient's presentation is most consistent with acute presentation with potential threat to life or bodily function.  On reassessment patient is still asymptomatic, shared decision making done with patient and husband and they are agreeable with plan for discharge, will follow-up with her primary care doctor for further management of her symptoms, will also give her referral to cardiology per her request.  Otherwise  considered but no indication for inpatient admission at this time, she is safe for outpatient management.  Will discharge with strict return precautions.  Clinical Course as of 08/01/23 1920  Tue Aug 01, 2023  1836 DG Chest 2 View IMPRESSION: No active cardiopulmonary disease.   [TT]  1918 Troponin x 2 is negative. [TT]    Clinical Course User Index [TT] Jodie Echevaria, Franchot Erichsen, MD     FINAL CLINICAL IMPRESSION(S) / ED DIAGNOSES   Final diagnoses:  Chest pain, unspecified type     Rx / DC Orders   ED Discharge Orders          Ordered    Ambulatory referral to Cardiology       Comments: If you have not heard from the Cardiology office within the next 72 hours please call [phone].   08/01/23 1837             Note:  This document was prepared using Dragon voice recognition software and may include unintentional dictation errors.    Claybon Jabs, MD 08/01/23 [phone]

## 2023-08-01 NOTE — ED Notes (Signed)
Dr. Jodie Echevaria informed of BP 190/61. Pt reports she is very stressed. Dr. Jodie Echevaria states the patient is ok for discharge.

## 2023-08-01 NOTE — ED Notes (Signed)
 ..  The patient is A&OX4, ambulatory at d/c with independent steady gait, NAD. Pt verbalized understanding of d/c instructions and follow up care.

## 2023-08-01 NOTE — ED Provider Triage Note (Signed)
Emergency Medicine Provider Triage Evaluation Note  Patty Shelton , a 83 y.o. female  was evaluated in triage.  Pt complains of cp.  Review of Systems  Positive:  Negative:   Physical Exam  BP (!) 154/85   Pulse 82   Temp 98.2 F (36.8 C) (Oral)   Resp 17   Ht 5\' 5"  (1.651 m)   Wt 78.2 kg   SpO2 95%   BMI 28.69 kg/m  Gen:   Awake, no distress   Resp:  Normal effort  MSK:   Moves extremities without difficulty  Other:    Medical Decision Making  Medically screening exam initiated at 3:40 PM.  Appropriate orders placed.  Patty Shelton was informed that the remainder of the evaluation will be completed by another provider, this initial triage assessment does not replace that evaluation, and the importance of remaining in the ED until their evaluation is complete.     Patty Ghee, PA-C 08/01/23 1541

## 2023-08-01 NOTE — Telephone Encounter (Signed)
 Agree with ER visit.

## 2023-08-01 NOTE — Telephone Encounter (Signed)
I spoke with pt; pt said she is not having any symptoms today. No chest pressure or SOB. Pt is presently at grocery store and when she gets home pt is going to speak with her husband and will probably go to get cked out especially with the call for bad weather tomorrow. UC & ED precautions given and pt voiced understanding and appreciative for call.. Sending note to Dr Ermalene Searing and [facility].

## 2023-08-01 NOTE — Telephone Encounter (Signed)
  Chief Complaint: chest pain Symptoms: pain and SOB Frequency: lasted 15-20 mins   Disposition: [x] ED /[] Urgent Care (no appt availability in office) / [] Appointment(In office/virtual)/ []  [facility]/ [] Home Care/ [] Refused Recommended Disposition /[] [facility]/ []  Follow-up with PCP Additional Notes: Pt calling with complaints of chest pain and SOB. Pt experienced this yesterday while cooking. Pt stated it last 15-20 min. Pt sat down and rested and pain/SOB went away. Pt rated pain 4/10. Pt is not currently experiencing any chest pain for SOB.  Per protocol, RN advised pt to go to ED. Pt stated she will go, "but not right now.' RN gave care advice and pt verbalized understanding. CAL notified of possible refusal.               Copied from CRM [id number]. Topic: Clinical - Red Word Triage >> Aug 01, 2023  9:22 AM Louie Boston wrote: Red Word that prompted transfer to Nurse Triage: Chest Pain Reason for Disposition  [1] Chest pain lasts > 5 minutes AND [2] occurred in past 3 days (72 hours) (Exception: Feels exactly the same as previously diagnosed heartburn and has accompanying sour taste in mouth.)  Answer Assessment - Initial Assessment Questions 1. LOCATION: "Where does it hurt?"       Left side behind left breast  2. RADIATION: "Does the pain go anywhere else?" (e.g., into neck, jaw, arms, back)     Pain hurt from front to back  3. ONSET: "When did the chest pain begin?" (Minutes, hours or days)      Yesterday 4. PATTERN: "Does the pain come and go, or has it been constant since it started?"  "Does it get worse with exertion?"     Comes and goes  5. DURATION: "How long does it last" (e.g., seconds, minutes, hours)     Lasted 15-20  6. SEVERITY: "How bad is the pain?"  (e.g., Scale 1-10; mild, moderate, or severe)    - MILD (1-3): doesn't interfere with normal activities     - MODERATE (4-7): interferes with normal activities or awakens from sleep    -  SEVERE (8-10): excruciating pain, unable to do any normal activities       4 7. CARDIAC RISK FACTORS: "Do you have any history of heart problems or risk factors for heart disease?" (e.g., angina, prior heart attack; diabetes, high blood pressure, high cholesterol, smoker, or strong family history of heart disease)     Denies  8. PULMONARY RISK FACTORS: "Do you have any history of lung disease?"  (e.g., blood clots in lung, asthma, emphysema, birth control pills)     Unsure  9. CAUSE: "What do you think is causing the chest pain?"     Cooking and moving around  10. OTHER SYMPTOMS: "Do you have any other symptoms?" (e.g., dizziness, nausea, vomiting, sweating, fever, difficulty breathing, cough)       SOB  Protocols used: Chest Pain-A-AH

## 2023-08-01 NOTE — ED Triage Notes (Signed)
Pt here with cp today. Pt states pain is centered and does not radiate. Pt describes the pain as tightness for about 15 mins. Pt states pain was tight in nature. Pt states the pain has went away but she was told to come to ED to be evaluated.

## 2023-08-02 ENCOUNTER — Telehealth: Payer: Self-pay | Admitting: Family Medicine

## 2023-08-02 NOTE — Telephone Encounter (Signed)
Copied from CRM [id number]. Topic: General - Other >> Aug 02, 2023 10:51 AM Gurney Maxin H wrote: Reason for CRM: Patient calling to notify provider that she went to the emergency room for chest pains and shortness of breath on 2/18. Patient refused to schedule an appointment stated she just wanted to let the provider know.  [place] [phone]

## 2023-08-03 NOTE — Telephone Encounter (Signed)
Noted, will reviewed ED notes.

## 2023-08-04 ENCOUNTER — Telehealth: Payer: Self-pay | Admitting: Cardiology

## 2023-08-04 ENCOUNTER — Other Ambulatory Visit: Payer: Self-pay | Admitting: Family Medicine

## 2023-08-04 ENCOUNTER — Ambulatory Visit
Admission: RE | Admit: 2023-08-04 | Discharge: 2023-08-04 | Disposition: A | Discharge status: Home / Self Care | Payer: Medicare Other | Source: Ambulatory Visit | Attending: Family Medicine | Admitting: Family Medicine

## 2023-08-04 DIAGNOSIS — Z1231 Encounter for screening mammogram for malignant neoplasm of breast: Secondary | ICD-10-CM | POA: Diagnosis not present

## 2023-08-04 DIAGNOSIS — N644 Mastodynia: Secondary | ICD-10-CM | POA: Diagnosis not present

## 2023-08-04 NOTE — Telephone Encounter (Signed)
Patient wants a provider switch from Dr. Herbie Baltimore to Dr. Mariah Milling.  Please confirm.

## 2023-08-06 NOTE — Telephone Encounter (Signed)
 Fine with me

## 2023-08-07 ENCOUNTER — Ambulatory Visit: Payer: Self-pay | Admitting: Family Medicine

## 2023-08-07 NOTE — Telephone Encounter (Signed)
 Chief Complaint: HTN, fast heartbeat Symptoms: HTN, feeling of accelerated heartbeat Frequency: once yesterday Pertinent Negatives: Patient denies cardiac symptoms - see notes below Disposition: [] ED /[] Urgent Care (no appt availability in office) / [x] Appointment(In office/virtual)/ []  [facility]/ [] Home Care/ [] Refused Recommended Disposition /[] [facility]/ []  Follow-up with PCP Additional Notes: Patient called in stating yesterday when she laid down to take a nap, she felt a feeling in her throat that felt like her heart was racing. She had her husband assist her with taking her blood pressure while she was sitting up in a chair and her BP reading was 168/92. Patient denies any previous diagnosis of A-Fib, and states she is on blood pressure medication that she takes daily. This RN asked patient if she can take her blood pressure while on the phone, but patient stated she cannot take it without assistance of husband who is not home at this time. This RN asked patient if she could manually feel her heart rate on her wrist to determine if her rhythm felt normal, but patient stated she was unsure how to do it. Patient states she believes she has only felt this "heart racing" feeling the one isolated occurrence yesterday. Patient denies at the time of the episode, and today, feeling lightheaded, dizzy, blurred vision, chest pain, difficulty breathing. Advised patient if she feels any of these symptoms, to call 911 or go to ED. Advised patient to keep log of BP readings for next 3 days until she sees provider on Wednesday.   Copied from CRM [id number]. Topic: Clinical - Red Word Triage >> Aug 07, 2023  9:56 AM Almira Coaster wrote: Red Word that prompted transfer to Nurse Triage: Patient is calling because she experienced high blood pressure yesterday, woke up from a nap and felt an accelerated heartbeat and blood pressure reading was 168/92. Reason for Disposition  Systolic BP  >= 160 OR  Diastolic >= 100  Answer Assessment - Initial Assessment Questions 1. BLOOD PRESSURE: "What is the blood pressure?" "Did you take at least two measurements 5 minutes apart?"     168/92 2. ONSET: "When did you take your blood pressure?"     Yesterday afternoon 3. HOW: "How did you take your blood pressure?" (e.g., automatic home BP monitor, visiting nurse)     Automatic BP cuff on left upper arm 4. HISTORY: "Do you have a history of high blood pressure?"     Yes 5. MEDICINES: "Are you taking any medicines for blood pressure?" "Have you missed any doses recently?"     Losartan, hydrochlorothiazide  6. OTHER SYMPTOMS: "Do you have any symptoms?" (e.g., blurred vision, chest pain, difficulty breathing, headache, weakness)     No  Protocols used: Blood Pressure - High-A-AH

## 2023-08-09 ENCOUNTER — Ambulatory Visit (INDEPENDENT_AMBULATORY_CARE_PROVIDER_SITE_OTHER): Payer: Medicare Other | Admitting: Family Medicine

## 2023-08-09 ENCOUNTER — Encounter: Payer: Self-pay | Admitting: Family Medicine

## 2023-08-09 VITALS — BP 154/70 | HR 75 | Temp 97.5°F | Ht 65.0 in | Wt 171.1 lb

## 2023-08-09 DIAGNOSIS — I10 Essential (primary) hypertension: Secondary | ICD-10-CM

## 2023-08-09 DIAGNOSIS — E871 Hypo-osmolality and hyponatremia: Secondary | ICD-10-CM | POA: Diagnosis not present

## 2023-08-09 LAB — COMPREHENSIVE METABOLIC PANEL
ALT: 11 U/L (ref 0–35)
AST: 14 U/L (ref 0–37)
Albumin: 4.3 g/dL (ref 3.5–5.2)
Alkaline Phosphatase: 58 U/L (ref 39–117)
BUN: 22 mg/dL (ref 6–23)
CO2: 26 meq/L (ref 19–32)
Calcium: 9.2 mg/dL (ref 8.4–10.5)
Chloride: 99 meq/L (ref 96–112)
Creatinine, Ser: 1.07 mg/dL (ref 0.40–1.20)
GFR: 48.14 mL/min — ABNORMAL LOW (ref >60.00)
Glucose, Bld: 98 mg/dL (ref 70–99)
Potassium: 3.9 meq/L (ref 3.5–5.1)
Sodium: 136 meq/L (ref 135–145)
Total Bilirubin: 0.6 mg/dL (ref 0.2–1.2)
Total Protein: 7 g/dL (ref 6.0–8.3)

## 2023-08-09 NOTE — Assessment & Plan Note (Signed)
 Acute, likely secondary to recent influenza and decreased p.o. intake.  Reevaluate with labs.

## 2023-08-09 NOTE — Patient Instructions (Signed)
 Call cardiology for an appointment for further evaluation of recent chest pain and  elevated blood pressure.   Please stop at the lab to have labs drawn.  If sodium is normal... we will plan to increase hydrochlorothiazide to 1 full tablet of 25 mg daily.  Follow BP... goal < 150/90.

## 2023-08-09 NOTE — Assessment & Plan Note (Signed)
 Chronic with acute worsening following recent illness. Reviewed ER evaluation which was unremarkable. Her symptoms of influenza have resolved significantly and she is only has remaining postnasal drip. No further chest pain and shortness of breath. Will encourage her to follow-up with cardiology for further evaluation given recent chest pain/age to make sure no further assessment for CHF or CAD as needed.  Will have her increase hydrochlorothiazide to 25 mg daily as long as sodium has returned to normal range with normal p.o. intake. Continue losartan 100 mg p.o. daily.

## 2023-08-09 NOTE — Progress Notes (Signed)
 Patient ID: [name], female    DOB: 30-Mar-1941, 83 y.o.   MRN: [id number]  This visit was conducted in person.  BP (!) 154/70 (BP Location: Left Arm, Patient Position: Sitting, Cuff Size: Normal)   Pulse 75   Temp (!) 97.5 F (36.4 C) (Temporal)   Ht 5\' 5"  (1.651 m)   Wt 171 lb 2 oz (77.6 kg)   SpO2 95%   BMI 28.48 kg/m    CC:  Chief Complaint  Patient presents with   Hypertension    Subjective:   HPI: Patty Shelton is a 83 y.o. female presenting on 08/09/2023 for Hypertension  ED office visit on August 01, 2023 for chest pain and shortness of breath... Following diagnosis of influenza on February 11. CBC was normal, sodium 131, normal potassium, negative troponin x 2 Chest x-ray unremarkable EKG no evidence of ischemia  Since then  in last few days BP has fluctuated up and down. 165/77- 171/104  Last week noted heart racing for a few minutes.. none since.  No missed doses of BP meds.  No SOB,  flu symptoms better but has residual post nasal drip.  Eating normally, goo water intake.  Hypertension:  Previously well-controlled blood pressure on hydrochlorothiazide 12.5 mg daily and losartan 100 mg daily  BP Readings from Last 3 Encounters:  08/09/23 (!) 154/70  08/01/23 (!) 190/61  07/25/23 (!) 140/60  Using medication without problems or lightheadedness:  none Chest pain with exertion: none Edema: none Short of breath: none Average home BPs: see above. Other issues:   No  new meds, no change in caffeine,  No salt change.   She would like to see cardiology again... [place] Dr. Herbie [place] in [place] and would like Dr. Mariah Milling.... husband sees.    NmL  TSH 11/2022     Relevant past medical, surgical, family and social history reviewed and updated as indicated. Interim medical history since our last visit reviewed. Allergies and medications reviewed and updated. Outpatient Medications Prior to Visit  Medication Sig Dispense Refill   acetaminophen (TYLENOL) 500 MG  tablet Take 500 mg by mouth every 6 (six) hours as needed.     albuterol (VENTOLIN HFA) 108 (90 Base) MCG/ACT inhaler Inhale 2 puffs into the lungs every 4 (four) hours as needed for wheezing or shortness of breath. 8 g 10   Biotin 10 MG TABS Take 1 tablet by mouth daily.     cetirizine (ZYRTEC) 10 MG tablet Take 10 mg by mouth daily as needed for allergies.     Cholecalciferol (VITAMIN D3) 50 MCG (2000 UT) TABS Take 1 tablet by mouth daily.     CRANBERRY PO Take 1 capsule by mouth daily.     diclofenac sodium (VOLTAREN) 1 % GEL Apply 4 g topically 4 (four) times daily as needed.     famciclovir (FAMVIR) 250 MG tablet TAKE 1 TABLET DAILY 90 tablet 3   fluticasone (FLONASE) 50 MCG/ACT nasal spray Place 2 sprays into both nostrils as needed.      fluticasone-salmeterol (ADVAIR HFA) 115-21 MCG/ACT inhaler USE 2 INHALATIONS TWICE A DAY 3 each 2   hydrochlorothiazide (HYDRODIURIL) 25 MG tablet TAKE ONE-HALF (1/2) TABLET DAILY 45 tablet 3   losartan (COZAAR) 100 MG tablet TAKE 1 TABLET DAILY 90 tablet 0   meloxicam (MOBIC) 15 MG tablet Take 15 mg by mouth daily.     methocarbamol (ROBAXIN) 500 MG tablet Take 500 mg by mouth 3 (three) times daily.  Multiple Minerals-Vitamins (CAL-MAG-ZINC-D PO) Take 1 tablet by mouth daily.     Respiratory Therapy Supplies (FLUTTER) DEVI 1 Device by Does not apply route as directed. 1 each 0   rosuvastatin (CRESTOR) 10 MG tablet TAKE 1 TABLET DAILY 90 tablet 0   vitamin B-12 (CYANOCOBALAMIN) 1000 MCG tablet Take 1,000 mcg by mouth daily.     vitamin E 180 MG (400 UNITS) capsule Take 400 Units by mouth daily.     oseltamivir (TAMIFLU) 75 MG capsule Take 1 capsule (75 mg total) by mouth 2 (two) times daily. 10 capsule 0   predniSONE (DELTASONE) 20 MG tablet 3 tabs by mouth daily x 3 days, then 2 tabs by mouth daily x 2 days then 1 tab by mouth daily x 2 days 15 tablet 0   No facility-administered medications prior to visit.     Per HPI unless specifically  indicated in ROS section below Review of Systems  Constitutional:  Negative for fatigue and fever.  HENT:  Negative for congestion.   Eyes:  Negative for pain.  Respiratory:  Negative for cough and shortness of breath.   Cardiovascular:  Negative for chest pain, palpitations and leg swelling.  Gastrointestinal:  Negative for abdominal pain.  Genitourinary:  Negative for dysuria and vaginal bleeding.  Musculoskeletal:  Negative for back pain.  Neurological:  Negative for syncope, light-headedness and headaches.  Psychiatric/Behavioral:  Negative for dysphoric mood.    Objective:  BP (!) 154/70 (BP Location: Left Arm, Patient Position: Sitting, Cuff Size: Normal)   Pulse 75   Temp (!) 97.5 F (36.4 C) (Temporal)   Ht 5\' 5"  (1.651 m)   Wt 171 lb 2 oz (77.6 kg)   SpO2 95%   BMI 28.48 kg/m   Wt Readings from Last 3 Encounters:  08/09/23 171 lb 2 oz (77.6 kg)  08/01/23 172 lb 6.4 oz (78.2 kg)  07/25/23 172 lb 6 oz (78.2 kg)      Physical Exam Constitutional:      General: She is not in acute distress.    Appearance: Normal appearance. She is well-developed. She is not ill-appearing or toxic-appearing.  HENT:     Head: Normocephalic.     Right Ear: Hearing, tympanic membrane, ear canal and external ear normal. Tympanic membrane is not erythematous, retracted or bulging.     Left Ear: Hearing, tympanic membrane, ear canal and external ear normal. Tympanic membrane is not erythematous, retracted or bulging.     Nose: No mucosal edema or rhinorrhea.     Right Sinus: No maxillary sinus tenderness or frontal sinus tenderness.     Left Sinus: No maxillary sinus tenderness or frontal sinus tenderness.     Mouth/Throat:     Pharynx: Uvula midline.  Eyes:     General: Lids are normal. Lids are everted, no foreign bodies appreciated.     Conjunctiva/sclera: Conjunctivae normal.     Pupils: Pupils are equal, round, and reactive to light.  Neck:     Thyroid: No thyroid mass or  thyromegaly.     Vascular: No carotid bruit.     Trachea: Trachea normal.  Cardiovascular:     Rate and Rhythm: Normal rate and regular rhythm.     Pulses: Normal pulses.     Heart sounds: Normal heart sounds, S1 normal and S2 normal. No murmur heard.    No friction rub. No gallop.  Pulmonary:     Effort: Pulmonary effort is normal. No tachypnea or respiratory distress.  Breath sounds: Normal breath sounds. No decreased breath sounds, wheezing, rhonchi or rales.  Abdominal:     General: Bowel sounds are normal.     Palpations: Abdomen is soft.     Tenderness: There is no abdominal tenderness.  Musculoskeletal:     Cervical back: Normal range of motion and neck supple.  Skin:    General: Skin is warm and dry.     Findings: No rash.  Neurological:     Mental Status: She is alert.  Psychiatric:        Mood and Affect: Mood is not anxious or depressed.        Speech: Speech normal.        Behavior: Behavior normal. Behavior is cooperative.        Thought Content: Thought content normal.        Judgment: Judgment normal.       Results for orders placed or performed during the hospital encounter of 08/01/23  Basic metabolic panel   Collection Time: 08/01/23  1:45 PM  Result Value Ref Range   Sodium 131 (L) 135 - 145 mmol/L   Potassium 3.8 3.5 - 5.1 mmol/L   Chloride 99 98 - 111 mmol/L   CO2 22 22 - 32 mmol/L   Glucose, Bld 117 (H) 70 - 99 mg/dL   BUN 21 8 - 23 mg/dL   Creatinine, Ser 1.61 (H) 0.44 - 1.00 mg/dL   Calcium 8.8 (L) 8.9 - 10.3 mg/dL   GFR, Estimated 53 (L) >60 mL/min   Anion gap 10 5 - 15  CBC   Collection Time: 08/01/23  1:45 PM  Result Value Ref Range   WBC 5.9 4.0 - 10.5 K/uL   RBC 4.03 3.87 - 5.11 MIL/uL   Hemoglobin 12.7 12.0 - 15.0 g/dL   HCT 09.6 04.5 - 40.9 %   MCV 90.3 80.0 - 100.0 fL   MCH 31.5 26.0 - 34.0 pg   MCHC 34.9 30.0 - 36.0 g/dL   RDW 81.1 91.4 - 78.2 %   Platelets 193 150 - 400 K/uL   nRBC 0.0 0.0 - 0.2 %  Troponin I (High  Sensitivity)   Collection Time: 08/01/23  1:45 PM  Result Value Ref Range   Troponin I (High Sensitivity) 5 <18 ng/L  Troponin I (High Sensitivity)   Collection Time: 08/01/23  6:37 PM  Result Value Ref Range   Troponin I (High Sensitivity) 6 <18 ng/L    Assessment and Plan  Primary hypertension Assessment & Plan: Chronic with acute worsening following recent illness. Reviewed ER evaluation which was unremarkable. Her symptoms of influenza have resolved significantly and she is only has remaining postnasal drip. No further chest pain and shortness of breath. Will encourage her to follow-up with cardiology for further evaluation given recent chest pain/age to make sure no further assessment for CHF or CAD as needed.  Will have her increase hydrochlorothiazide to 25 mg daily as long as sodium has returned to normal range with normal p.o. intake. Continue losartan 100 mg p.o. daily.  Orders: -     Comprehensive metabolic panel  Hyponatremia Assessment & Plan: Acute, likely secondary to recent influenza and decreased p.o. intake.  Reevaluate with labs.     No follow-ups on file.   Kerby Nora, MD

## 2023-08-09 NOTE — Addendum Note (Signed)
 Addended by: Damita Lack on: 08/09/2023 04:43 PM   Modules accepted: Orders

## 2023-08-17 ENCOUNTER — Ambulatory Visit: Attending: Cardiology | Admitting: Cardiology

## 2023-08-17 ENCOUNTER — Encounter: Payer: Self-pay | Admitting: Cardiology

## 2023-08-17 VITALS — BP 179/86 | HR 78 | Ht 66.5 in | Wt 172.0 lb

## 2023-08-17 DIAGNOSIS — R0989 Other specified symptoms and signs involving the circulatory and respiratory systems: Secondary | ICD-10-CM | POA: Diagnosis not present

## 2023-08-17 DIAGNOSIS — R0609 Other forms of dyspnea: Secondary | ICD-10-CM | POA: Diagnosis not present

## 2023-08-17 DIAGNOSIS — J4531 Mild persistent asthma with (acute) exacerbation: Secondary | ICD-10-CM | POA: Diagnosis not present

## 2023-08-17 DIAGNOSIS — R079 Chest pain, unspecified: Secondary | ICD-10-CM

## 2023-08-17 DIAGNOSIS — E785 Hyperlipidemia, unspecified: Secondary | ICD-10-CM

## 2023-08-17 DIAGNOSIS — J849 Interstitial pulmonary disease, unspecified: Secondary | ICD-10-CM

## 2023-08-17 DIAGNOSIS — R002 Palpitations: Secondary | ICD-10-CM | POA: Diagnosis not present

## 2023-08-17 DIAGNOSIS — E871 Hypo-osmolality and hyponatremia: Secondary | ICD-10-CM | POA: Diagnosis not present

## 2023-08-17 DIAGNOSIS — I10 Essential (primary) hypertension: Secondary | ICD-10-CM

## 2023-08-17 DIAGNOSIS — I25118 Atherosclerotic heart disease of native coronary artery with other forms of angina pectoris: Secondary | ICD-10-CM | POA: Diagnosis not present

## 2023-08-17 MED ORDER — METOPROLOL SUCCINATE ER 25 MG PO TB24: 12.5000 mg | ORAL_TABLET | Freq: Every day | ORAL | 3 refills | Status: DC

## 2023-08-17 NOTE — Progress Notes (Signed)
 Cardiology Office Note:  .   Date:  08/17/2023  ID:  Patty Shelton, DOB 1941-01-09, MRN [id number] PCP: Excell Seltzer, MD  [facility] Providers Cardiologist:  None    History of Present Illness: Patty Shelton   Patty Shelton is a 83 y.o. female with a past medical history notable for hypertension, hyperlipidemia, longstanding interstitial lung disease/bronchiectasis, asthma, dysphagia, GERD, hearing loss, osteopenia, CKD stage III, who is here today for follow-up after recent visit to the emergency department with complaints of chest pain.   Patient had an echocardiogram done 05/2021 which revealed an LVEF of 55 to 60%, no RWMA, G1 DD, and mild LA dilatation.  Normal RV size and function and normal estimated RVSP and RAP.  Coronary CTA in 04/2021 revealed calcium score of 20 with minimal proximal dominant RCA calcified plaque at 0-24% with no plaque noted in the LAD and nondominant left circumflex.  Normal PA size and mild aortic root and ascending aortic calcification evaluation for dissection with no significant extracardiac findings.  She was last seen in clinic by Dr. Herbie Baltimore 07/08/2021.  She was doing well recovering from a minor illness over the holidays but still having DOE.  She was seen by pulmonary for evaluation.  There were no medication changes that were made.  It was recommended she keep her target LDL less than 100.  With essentially normal cardiac evaluation it was recommended she continue to follow-up with her PCP and pulmonary and follow-up with cardiology on an as-needed basis.   She presented to the [facility] emergency department 08/01/2023 with complaints of chest pain.  She also had a shortness of breath that was associated with her chest discomfort that happened yesterday on the day before.  She had some congestion and cough recently.  Denied any fever, nausea vomiting diarrhea or leg swelling.  She had previously just been diagnosed with influenza A on February 11.  Initial vital signs  were blood pressure 150/85, pulse of 82, respirations of 17, temperature 98.2.  Pertinent labs revealed sodium 131, blood glucose of 117, serum creatinine 1.05, calcium 8.8, high-sensitivity troponins were negative x 2.  EKG revealed sinus rhythm with a rate of 70 with normal QRS, T wave flattening in aVL, no significant change.  Chest x-ray revealed no focal consolidation.  She returns to clinic today stating that she had previously had chest discomfort since 1 time several weeks ago but has not had any reoccurrence.  She does have elevated blood pressure and is questioning if her medications need to be adjusted.  Primary care providers just recently increased her HCTZ to 25 mg daily from 12.5 mg daily.  Repeat sodium was improved so she was continued on her current medication regimen.  She stated that her blood pressure was elevated today due to the ride over here as they had taken the wrong turn and going to the wrong building before her appointment.  She also stated that she was diagnosed with hypertension approximately 3 years ago.  She was intolerant to amlodipine as it caused a large amount of swelling to her bilateral lower extremities.  She states that she has been compliant with her current medications with no missed doses.  She stated that since she had the flu blood pressures have been slightly more elevated and her heart rate has been slightly elevated.  ROS: 10 point review of system has been reviewed and considered negative except ones been listed in HPI  Studies Reviewed: .  2D echo 05/27/2021 1. Left ventricular ejection fraction, by estimation, is 55 to 60%. The  left ventricle has normal function. The left ventricle has no regional  wall motion abnormalities. Left ventricular diastolic parameters are  consistent with Grade I diastolic  dysfunction (impaired relaxation).   2. Right ventricular systolic function is normal. The right ventricular  size is normal.   3. Left atrial  size was mildly dilated.   4. The mitral valve is normal in structure. No evidence of mitral valve  regurgitation.   5. The aortic valve is tricuspid. Aortic valve regurgitation is not  visualized.   6. The inferior vena cava is normal in size with greater than 50%  respiratory variability, suggesting right atrial pressure of 3 mmHg.   cCTA 05/03/2021 IMPRESSION: 1. Coronary calcium score of 26.9. This was 28th percentile for age and sex matched control.   2. Normal coronary origin with right dominance.   3. Calcified plaque causing minimal proximal RCA disease.   4. CAD-RADS 1. Minimal non-obstructive CAD (0-24%). Consider non-atherosclerotic causes of chest pain. Consider preventive therapy and risk factor modification.   Risk Assessment/Calculations:     HYPERTENSION CONTROL Vitals:   08/17/23 1438 08/17/23 1445  BP: (!) 194/90 (!) 179/86    The patient's blood pressure is elevated above target today.  In order to address the patient's elevated BP: A new medication was prescribed today.          Physical Exam:   VS:  BP (!) 179/86 (BP Location: Right Arm, Patient Position: Sitting, Cuff Size: Normal)   Pulse 78   Ht 5' 6.5" (1.689 m)   Wt 172 lb (78 kg)   SpO2 96%   BMI 27.35 kg/m    Wt Readings from Last 3 Encounters:  08/17/23 172 lb (78 kg)  08/09/23 171 lb 2 oz (77.6 kg)  08/01/23 172 lb 6.4 oz (78.2 kg)    GEN: Well nourished, well developed in no acute distress NECK: No JVD; Right carotid bruit, none heard on the left CARDIAC: RRR, no murmurs, rubs, gallops RESPIRATORY:  Clear to auscultation without rales, wheezing or rhonchi  ABDOMEN: Soft, non-tender, non-distended EXTREMITIES:  No edema; No deformity   ASSESSMENT AND PLAN: .   Atypical chest pain where she had presented to the [facility] emergency department with a workup that was unrevealing.  Troponins were negative x 2.  She states she has not had any reoccurrence of the chest discomfort but her blood  pressure was noted to be suboptimally controlled.  Continue discussion about various modalities of testing and they have been deferred at this time with her only had been the 1 incident that she is unsure if it was related to elevated pressures or even possibly GERD.  Primary hypertension blood pressure of 194/90 and then repeat of 179.86.  She had also noted elevated heart rates with elevated pressures.  Her HCTZ was just increased to 25 mg daily by her PCP.  For elevated heart rates required low-dose Toprol-XL 12.5 mg daily to her daily regimen.  She has been encouraged to monitor pressures 1 to 2 hours postmedication administration as well.  Hyponatremia previously likely secondary to HCTZ use which repeat labs revealed a sodium of 136 and PCP and started back on HCTZ.  She will need an updated chemistry in several weeks to ensure that her sodium level is staying regulated.  Palpitations that she noticed with rest and exertion.  Not related to caffeine.  They are happening infrequently  and most likely related to her blood pressure.  Hopefully with starting mild beta-blocker therapy of Toprol-XL 12.5 mg daily that will help with her palpitations.  Shortness of breath that is chronic and that is still likely multifactorial related to asthma and deconditioning as well as her interstitial lung disease.  She continues to be followed by pulmonary.  Prior echocardiogram was unremarkable with a normal EF and normal diastolic function and her cardiac CTA did not show any significant coronary artery disease.  She also recently has gotten over influenza A.  Hyperlipidemia with her most recent LDL of 87.  She is continued on rosuvastatin 10 mg daily.  Ongoing management per PCP.  Carotid bruit noted on the right she is being scheduled for a carotid duplex.  Coronary atherosclerosis with minimal disease noted on coronary CTA.  Recommendations were to keep LDL target less than 100.  Aspirin daily was not  recommended due to the extent of the coronary atherosclerosis.       Dispo: Patient to return to clinic to see MD/APP in 6 weeks or sooner if needed for reevaluation of symptoms after testing is completed.  Signed, Umar Patmon, NP

## 2023-08-17 NOTE — Patient Instructions (Signed)
 Medication Instructions:  Your physician recommends the following medication changes.   START TAKING: Toprol -XL 12.5 MG Daily   *If you need a refill on your cardiac medications before your next appointment, please call your pharmacy*   Lab Work: No labs ordered today  If you have labs (blood work) drawn today and your tests are completely normal, you will receive your results only by: MyChart Message (if you have MyChart) OR A paper copy in the mail If you have any lab test that is abnormal or we need to change your treatment, we will call you to review the results.   Testing/Procedures: Your physician has requested that you have a carotid duplex. This test is an ultrasound of the carotid arteries in your neck. It looks at blood flow through these arteries that supply the brain with blood.   Allow one hour for this exam.  There are no restrictions or special instructions.  This will take place at [address] [facility] [address], [place] [address]  Please note: We ask at that you not bring children with you during ultrasound (echo/ vascular) testing. Due to room size and safety concerns, children are not allowed in the ultrasound rooms during exams. Our front office staff cannot provide observation of children in our lobby area while testing is being conducted. An adult accompanying a patient to their appointment will only be allowed in the ultrasound room at the discretion of the ultrasound technician under special circumstances. We apologize for any inconvenience.    Follow-Up: At [facility], you and your health needs are our priority.  As part of our continuing mission to provide you with exceptional heart care, we have created designated Provider Care Teams.  These Care Teams include your primary Cardiologist (physician) and Advanced Practice Providers (APPs -  Physician Assistants and Nurse Practitioners) who all work together to provide you with the  care you need, when you need it.  We recommend signing up for the patient portal called "MyChart".  Sign up information is provided on this After Visit Summary.  MyChart is used to connect with patients for Virtual Visits (Telemedicine).  Patients are able to view lab/test results, encounter notes, upcoming appointments, etc.  Non-urgent messages can be sent to your provider as well.   To learn more about what you can do with MyChart, go to [email or web address].    Your next appointment:   6 week(s)  Provider:   You will see one of the following Advanced Practice Providers on your designated Care Team:   Nicolasa Ducking, NP Eula Listen, PA-C Cadence Fransico Michael, PA-C Charlsie Quest, NP Carlos Levering, NP

## 2023-08-21 ENCOUNTER — Other Ambulatory Visit: Payer: Self-pay | Admitting: Family Medicine

## 2023-08-25 ENCOUNTER — Other Ambulatory Visit: Payer: Self-pay | Admitting: Family Medicine

## 2023-09-07 ENCOUNTER — Ambulatory Visit (INDEPENDENT_AMBULATORY_CARE_PROVIDER_SITE_OTHER): Admitting: Family Medicine

## 2023-09-07 ENCOUNTER — Encounter: Payer: Self-pay | Admitting: Family Medicine

## 2023-09-07 VITALS — BP 148/62 | HR 69 | Temp 98.3°F | Ht 66.5 in | Wt 172.0 lb

## 2023-09-07 DIAGNOSIS — M5441 Lumbago with sciatica, right side: Secondary | ICD-10-CM | POA: Diagnosis not present

## 2023-09-07 NOTE — Progress Notes (Signed)
 Patient ID: Patty Shelton, female    DOB: Oct 07, 1940, 83 y.o.   MRN: [id number]  This visit was conducted in person.  BP (!) 150/70 (BP Location: Left Arm, Patient Position: Sitting, Cuff Size: Large)   Pulse 69   Temp 98.3 F (36.8 C) (Temporal)   Ht 5' 6.5" (1.689 m)   Wt 172 lb (78 kg)   SpO2 95%   BMI 27.35 kg/m    CC:  Chief Complaint  Patient presents with   Flank Pain    Right-Sharp-Tues Night but it is better now    Subjective:   HPI: Patty Shelton is a 83 y.o. female presenting on 09/07/2023 for Flank Pain (Right-Sharp-Tues Night but it is better now)   New onset flank pain 2 days ago.. went to bathroom at night... when sat up had shooting pain through right posterior hip.. radiated thorugh to RLQ... sharp sudden pain.Marland Kitchen lasted moments.  Significantly when she woke up.   No numbness or weakness in right leg.  No radiation to right leg.  No fever.  No N/V/D, no dysuria.   No proceeding falls or change in activity.    Reviewed 08/17/2023 ... Added Toprol Xl to regimen. BP Readings from Last 3 Encounters:  09/07/23 (!) 150/70  08/17/23 (!) 179/86  08/09/23 (!) 154/70        Relevant past medical, surgical, family and social history reviewed and updated as indicated. Interim medical history since our last visit reviewed. Allergies and medications reviewed and updated. Outpatient Medications Prior to Visit  Medication Sig Dispense Refill   acetaminophen (TYLENOL) 500 MG tablet Take 500 mg by mouth every 6 (six) hours as needed.     albuterol (VENTOLIN HFA) 108 (90 Base) MCG/ACT inhaler Inhale 2 puffs into the lungs every 4 (four) hours as needed for wheezing or shortness of breath. 8 g 10   Biotin 10 MG TABS Take 1 tablet by mouth daily.     cetirizine (ZYRTEC) 10 MG tablet Take 10 mg by mouth daily as needed for allergies.     Cholecalciferol (VITAMIN D3) 50 MCG (2000 UT) TABS Take 1 tablet by mouth daily.     CRANBERRY PO Take 1 capsule by mouth daily.      diclofenac sodium (VOLTAREN) 1 % GEL Apply 4 g topically 4 (four) times daily as needed.     famciclovir (FAMVIR) 250 MG tablet TAKE 1 TABLET DAILY 90 tablet 3   fluticasone (FLONASE) 50 MCG/ACT nasal spray Place 2 sprays into both nostrils as needed.      fluticasone-salmeterol (ADVAIR HFA) 115-21 MCG/ACT inhaler USE 2 INHALATIONS TWICE A DAY 3 each 2   hydrochlorothiazide (HYDRODIURIL) 25 MG tablet Take 25 mg by mouth daily.     losartan (COZAAR) 100 MG tablet TAKE 1 TABLET DAILY 90 tablet 0   meloxicam (MOBIC) 15 MG tablet Take 15 mg by mouth daily.     methocarbamol (ROBAXIN) 500 MG tablet Take 500 mg by mouth 3 (three) times daily.     metoprolol succinate (TOPROL XL) 25 MG 24 hr tablet Take 0.5 tablets (12.5 mg total) by mouth daily. 90 tablet 3   Multiple Minerals-Vitamins (CAL-MAG-ZINC-D PO) Take 1 tablet by mouth daily.     Respiratory Therapy Supplies (FLUTTER) DEVI 1 Device by Does not apply route as directed. 1 each 0   rosuvastatin (CRESTOR) 10 MG tablet TAKE 1 TABLET DAILY 90 tablet 3   vitamin B-12 (CYANOCOBALAMIN) 1000 MCG tablet Take 1,000 mcg  by mouth daily.     vitamin E 180 MG (400 UNITS) capsule Take 400 Units by mouth daily.     No facility-administered medications prior to visit.     Per HPI unless specifically indicated in ROS section below Review of Systems  Constitutional:  Negative for fatigue and fever.  HENT:  Negative for congestion.   Eyes:  Negative for pain.  Respiratory:  Negative for cough and shortness of breath.   Cardiovascular:  Negative for chest pain, palpitations and leg swelling.  Gastrointestinal:  Negative for abdominal pain.  Genitourinary:  Negative for dysuria and vaginal bleeding.  Musculoskeletal:  Positive for back pain.  Neurological:  Negative for syncope, light-headedness and headaches.  Psychiatric/Behavioral:  Negative for dysphoric mood.    Objective:  BP (!) 150/70 (BP Location: Left Arm, Patient Position: Sitting, Cuff Size:  Large)   Pulse 69   Temp 98.3 F (36.8 C) (Temporal)   Ht 5' 6.5" (1.689 m)   Wt 172 lb (78 kg)   SpO2 95%   BMI 27.35 kg/m   Wt Readings from Last 3 Encounters:  09/07/23 172 lb (78 kg)  08/17/23 172 lb (78 kg)  08/09/23 171 lb 2 oz (77.6 kg)      Physical Exam Constitutional:      General: She is not in acute distress.    Appearance: Normal appearance. She is well-developed. She is not ill-appearing or toxic-appearing.  HENT:     Head: Normocephalic.     Right Ear: Hearing, tympanic membrane, ear canal and external ear normal. Tympanic membrane is not erythematous, retracted or bulging.     Left Ear: Hearing, tympanic membrane, ear canal and external ear normal. Tympanic membrane is not erythematous, retracted or bulging.     Nose: No mucosal edema or rhinorrhea.     Right Sinus: No maxillary sinus tenderness or frontal sinus tenderness.     Left Sinus: No maxillary sinus tenderness or frontal sinus tenderness.     Mouth/Throat:     Pharynx: Uvula midline.  Eyes:     General: Lids are normal. Lids are everted, no foreign bodies appreciated.     Conjunctiva/sclera: Conjunctivae normal.     Pupils: Pupils are equal, round, and reactive to light.  Neck:     Thyroid: No thyroid mass or thyromegaly.     Vascular: No carotid bruit.     Trachea: Trachea normal.  Cardiovascular:     Rate and Rhythm: Normal rate and regular rhythm.     Pulses: Normal pulses.     Heart sounds: Normal heart sounds, S1 normal and S2 normal. No murmur heard.    No friction rub. No gallop.  Pulmonary:     Effort: Pulmonary effort is normal. No tachypnea or respiratory distress.     Breath sounds: Normal breath sounds. No decreased breath sounds, wheezing, rhonchi or rales.  Abdominal:     General: Bowel sounds are normal.     Palpations: Abdomen is soft.     Tenderness: There is no abdominal tenderness.  Musculoskeletal:     Cervical back: Normal, normal range of motion and neck supple.      Thoracic back: Normal.     Lumbar back: No spasms, tenderness or bony tenderness. Normal range of motion. Negative right straight leg raise test and negative left straight leg raise test.     Left hip: No tenderness, bony tenderness or crepitus. Decreased range of motion. Normal strength.     Comments:  Points to  pain in right buttock at sciatic notch, no pain now only slightly sore.    Skin:    General: Skin is warm and dry.     Findings: No rash.  Neurological:     Mental Status: She is alert.  Psychiatric:        Mood and Affect: Mood is not anxious or depressed.        Speech: Speech normal.        Behavior: Behavior normal. Behavior is cooperative.        Thought Content: Thought content normal.        Judgment: Judgment normal.       Results for orders placed or performed in visit on 08/09/23  Comprehensive metabolic panel   Collection Time: 08/09/23  8:53 AM  Result Value Ref Range   Sodium 136 135 - 145 mEq/L   Potassium 3.9 3.5 - 5.1 mEq/L   Chloride 99 96 - 112 mEq/L   CO2 26 19 - 32 mEq/L   Glucose, Bld 98 70 - 99 mg/dL   BUN 22 6 - 23 mg/dL   Creatinine, Ser 1.61 0.40 - 1.20 mg/dL   Total Bilirubin 0.6 0.2 - 1.2 mg/dL   Alkaline Phosphatase 58 39 - 117 U/L   AST 14 0 - 37 U/L   ALT 11 0 - 35 U/L   Total Protein 7.0 6.0 - 8.3 g/dL   Albumin 4.3 3.5 - 5.2 g/dL   GFR 09.60 (L) >45.40 mL/min   Calcium 9.2 8.4 - 10.5 mg/dL    Assessment and Plan  Acute right-sided low back pain with right-sided sciatica Assessment & Plan:  Acute,  MSK strain vs sciatica, no resolved.  Can use tylenol ES 2 tabs up to three times daily for pain.  Start heat and gentle low back stretching.  Call if pain returns.     No red flags.  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  No follow-ups on file.   Kerby Nora, MD

## 2023-09-07 NOTE — Assessment & Plan Note (Signed)
 Acute,  MSK strain vs sciatica, no resolved.  Can use tylenol ES 2 tabs up to three times daily for pain.  Start heat and gentle low back stretching.  Call if pain returns.

## 2023-09-07 NOTE — Patient Instructions (Signed)
 Can use tylenol ES 2 tabs up to three times daily for pain.  Start heat and gentle low back stretching.  Call if pain returns.

## 2023-09-08 ENCOUNTER — Ambulatory Visit: Payer: Medicare Other | Admitting: Medical

## 2023-09-13 ENCOUNTER — Ambulatory Visit: Attending: Cardiology

## 2023-09-13 DIAGNOSIS — R0989 Other specified symptoms and signs involving the circulatory and respiratory systems: Secondary | ICD-10-CM | POA: Insufficient documentation

## 2023-09-15 ENCOUNTER — Telehealth: Payer: Self-pay | Admitting: Cardiology

## 2023-09-15 ENCOUNTER — Encounter: Payer: Self-pay | Admitting: *Deleted

## 2023-09-15 NOTE — Telephone Encounter (Signed)
 The patient has been notified of the result along with recommendations. Pt verbalized understanding. All questions (if any) were answered      Charlsie Quest, NP 09/15/2023 10:47 AM EDT     No stenosis noted in the right carotid artery and 1-39% stenosis noted in the left ICA.  No significant stenosis found.  Continue with current medication regimen without changes needed at this time.

## 2023-09-15 NOTE — Progress Notes (Signed)
 No stenosis noted in the right carotid artery and 1-39% stenosis noted in the left ICA.  No significant stenosis found.  Continue with current medication regimen without changes needed at this time.

## 2023-09-15 NOTE — Telephone Encounter (Signed)
Pt returning call to nurse for results

## 2023-09-21 ENCOUNTER — Ambulatory Visit (INDEPENDENT_AMBULATORY_CARE_PROVIDER_SITE_OTHER): Admitting: Dermatology

## 2023-09-21 DIAGNOSIS — W908XXA Exposure to other nonionizing radiation, initial encounter: Secondary | ICD-10-CM

## 2023-09-21 DIAGNOSIS — L578 Other skin changes due to chronic exposure to nonionizing radiation: Secondary | ICD-10-CM | POA: Diagnosis not present

## 2023-09-21 DIAGNOSIS — L814 Other melanin hyperpigmentation: Secondary | ICD-10-CM

## 2023-09-21 DIAGNOSIS — Z872 Personal history of diseases of the skin and subcutaneous tissue: Secondary | ICD-10-CM | POA: Diagnosis not present

## 2023-09-21 DIAGNOSIS — L821 Other seborrheic keratosis: Secondary | ICD-10-CM

## 2023-09-21 DIAGNOSIS — L853 Xerosis cutis: Secondary | ICD-10-CM

## 2023-09-21 DIAGNOSIS — L820 Inflamed seborrheic keratosis: Secondary | ICD-10-CM

## 2023-09-21 DIAGNOSIS — D1801 Hemangioma of skin and subcutaneous tissue: Secondary | ICD-10-CM

## 2023-09-21 NOTE — Patient Instructions (Addendum)
 Recommend OTC Gold Bond Rapid Relief Anti-Itch cream (pramoxine + menthol), CeraVe Anti-itch cream or lotion (pramoxine), Sarna lotion (Original- menthol + camphor or Sensitive- pramoxine) or Eucerin 12 hour Itch Relief lotion (menthol) up to 3 times per day to areas on body that are itchy.   Gentle Skin Care Guide  1. Bathe no more than once a day.  2. Avoid bathing in hot water  3. Use a mild soap like Dove, Vanicream, Cetaphil, CeraVe. Can use Lever 2000 or Cetaphil antibacterial soap  4. Use soap only where you need it. On most days, use it under your arms, between your legs, and on your feet. Let the water rinse other areas unless visibly dirty.  5. When you get out of the bath/shower, use a towel to gently blot your skin dry, don't rub it.  6. While your skin is still a little damp, apply a moisturizing cream such as Vanicream, CeraVe, Cetaphil, Eucerin, Sarna lotion or plain Vaseline Jelly. For hands apply Neutrogena [place] Hand Cream or Excipial Hand Cream.  7. Reapply moisturizer any time you start to itch or feel dry.  8. Sometimes using free and clear laundry detergents can be helpful. Fabric softener sheets should be avoided. Downy Free & Gentle liquid, or any liquid fabric softener that is free of dyes and perfumes, it acceptable to use  9. If your doctor has given you prescription creams you may apply moisturizers over them     Seborrheic Keratosis  What causes seborrheic keratoses? Seborrheic keratoses are harmless, common skin growths that first appear during adult life.  As time goes by, more growths appear.  Some people may develop a large number of them.  Seborrheic keratoses appear on both covered and uncovered body parts.  They are not caused by sunlight.  The tendency to develop seborrheic keratoses can be inherited.  They vary in color from skin-colored to gray, brown, or even black.  They can be either smooth or have a rough, warty surface.   Seborrheic  keratoses are superficial and look as if they were stuck on the skin.  Under the microscope this type of keratosis looks like layers upon layers of skin.  That is why at times the top layer may seem to fall off, but the rest of the growth remains and re-grows.    Treatment Seborrheic keratoses do not need to be treated, but can easily be removed in the office.  Seborrheic keratoses often cause symptoms when they rub on clothing or jewelry.  Lesions can be in the way of shaving.  If they become inflamed, they can cause itching, soreness, or burning.  Removal of a seborrheic keratosis can be accomplished by freezing, burning, or surgery. If any spot bleeds, scabs, or grows rapidly, please return to have it checked, as these can be an indication of a skin cancer.  Cryotherapy Aftercare  Wash gently with soap and water everyday.   Apply Vaseline and Band-Aid daily until healed.    Due to recent changes in healthcare laws, you may see results of your pathology and/or laboratory studies on MyChart before the doctors have had a chance to review them. We understand that in some cases there may be results that are confusing or concerning to you. Please understand that not all results are received at the same time and often the doctors may need to interpret multiple results in order to provide you with the best plan of care or course of treatment. Therefore, we ask that you  please give [place] 2 business days to thoroughly review all your results before contacting the office for clarification. Should we see a critical lab result, you will be contacted sooner.   If You Need Anything After Your Visit  If you have any questions or concerns for your doctor, please call our main line at [phone] and press option 4 to reach your doctor's medical assistant. If no one answers, please leave a voicemail as directed and we will return your call as soon as possible. Messages left after 4 pm will be answered the following  business day.   You may also send [place] a message via MyChart. We typically respond to MyChart messages within 1-2 business days.  For prescription refills, please ask your pharmacy to contact our office. Our fax number is [phone].  If you have an urgent issue when the clinic is closed that cannot wait until the next business day, you can page your doctor at the number below.    Please note that while we do our best to be available for urgent issues outside of office hours, we are not available 24/7.   If you have an urgent issue and are unable to reach [place], you may choose to seek medical care at your doctor's office, retail clinic, urgent care center, or emergency room.  If you have a medical emergency, please immediately call 911 or go to the emergency department.  Pager Numbers  - Dr. Gwen Pounds: [phone]  - Dr. Roseanne Reno: [phone]  - Dr. Katrinka Blazing: [phone]   In the event of inclement weather, please call our main line at [phone] for an update on the status of any delays or closures.  Dermatology Medication Tips: Please keep the boxes that topical medications come in in order to help keep track of the instructions about where and how to use these. Pharmacies typically print the medication instructions only on the boxes and not directly on the medication tubes.   If your medication is too expensive, please contact our office at [phone] option 4 or send [place] a message through MyChart.   We are unable to tell what your co-pay for medications will be in advance as this is different depending on your insurance coverage. However, we may be able to find a substitute medication at lower cost or fill out paperwork to get insurance to cover a needed medication.   If a prior authorization is required to get your medication covered by your insurance company, please allow [place] 1-2 business days to complete this process.  Drug prices often vary depending on where the prescription is  filled and some pharmacies may offer cheaper prices.  The website www.goodrx.com contains coupons for medications through different pharmacies. The prices here do not account for what the cost may be with help from insurance (it may be cheaper with your insurance), but the website can give you the price if you did not use any insurance.  - You can print the associated coupon and take it with your prescription to the pharmacy.  - You may also stop by our office during regular business hours and pick up a GoodRx coupon card.  - If you need your prescription sent electronically to a different pharmacy, notify our office through [facility] or by phone at [phone] option 4.     Si Usted Necesita Algo Despus de Su Visita  Tambin puede enviarnos un mensaje a travs de Clinical cytogeneticist. Por lo general respondemos a los mensajes de Psychologist, sport and exercise transcurso  de 1 a 2 das hbiles.  Para renovar recetas, por favor pida a su farmacia que se ponga en contacto con nuestra oficina. Annie Sable de fax es [place] [phone].  Si tiene un asunto urgente cuando la clnica est cerrada y que no puede esperar hasta el siguiente da hbil, puede llamar/localizar a su doctor(a) al nmero que aparece a continuacin.   Por favor, tenga en cuenta que aunque hacemos todo lo posible para estar disponibles para asuntos urgentes fuera del horario de [place], no estamos disponibles las 24 horas del da, los 7 [address]  [address] de la [place].   Si tiene un problema urgente y no puede comunicarse con nosotros, puede optar por buscar atencin mdica  en el consultorio de su doctor(a), en una clnica privada, en un centro de atencin urgente o en una sala de emergencias.  Si tiene Engineer, drilling, por favor llame inmediatamente al 911 o vaya a la sala de emergencias.  Nmeros de bper  - Dr. Gwen Pounds: [phone]  - Dra. Roseanne Reno: [phone]  - Dr. Katrinka Blazing: [phone]   En caso de inclemencias del tiempo, por favor  llame a Lacy Duverney principal al [phone] para una actualizacin sobre el [place] de cualquier retraso o cierre.  Consejos para la medicacin en dermatologa: Por favor, guarde las cajas en las que vienen los medicamentos de uso tpico para ayudarle a seguir las instrucciones sobre dnde y cmo usarlos. Las farmacias generalmente imprimen las instrucciones del medicamento slo en las cajas y no directamente en los tubos del [place].   Si su medicamento es muy caro, por favor, pngase en contacto con Rolm Gala llamando al [phone] y presione la opcin 4 o envenos un mensaje a travs de Clinical cytogeneticist.   No podemos decirle cul ser su copago por los medicamentos por adelantado ya que esto es diferente dependiendo de la cobertura de su seguro. Sin embargo, es posible que podamos encontrar un medicamento sustituto a Audiological scientist un formulario para que el seguro cubra el medicamento que se considera necesario.   Si se requiere una autorizacin previa para que su compaa de seguros [place] su medicamento, por favor permtanos de 1 a 2 das hbiles para completar [address]  Los precios de los medicamentos varan con frecuencia dependiendo del Environmental consultant de dnde se surte la receta y alguna farmacias pueden ofrecer precios ms baratos.  El sitio web www.goodrx.com tiene cupones para medicamentos de Health and safety inspector. Los precios aqu no tienen en cuenta lo que podra costar con la ayuda del seguro (puede ser ms barato con su seguro), pero el sitio web puede darle el precio si no utiliz Tourist information centre manager.  - Puede imprimir el cupn correspondiente y llevarlo con su receta a la farmacia.  - Tambin puede pasar por nuestra oficina durante el horario de atencin regular y Education officer, museum una tarjeta de cupones de GoodRx.  - Si necesita que su receta se enve electrnicamente a una farmacia diferente, informe a nuestra oficina a travs de MyChart de [place] o por telfono llamando al [phone]  y presione la opcin 4.

## 2023-09-21 NOTE — Progress Notes (Signed)
   Follow-Up Visit   Subjective  Patty Shelton is a 83 y.o. female who presents for the following: patient here concerning itchy spot at mid back near bra line, spots at hands and arms, and itchy skin on arms. Hx of isks at right temple and left zygoma.   The patient has spots, moles and lesions to be evaluated, some may be new or changing and the patient may have concern these could be cancer.   The following portions of the chart were reviewed this encounter and updated as appropriate: medications, allergies, medical history  Review of Systems:  No other skin or systemic complaints except as noted in HPI or Assessment and Plan.  Objective  Well appearing patient in no apparent distress; mood and affect are within normal limits.    A focused examination was performed of the following areas: B/l arms and hands, back, face   Relevant exam findings are noted in the Assessment and Plan.  left spinal mid back x 8, b/l forearms x 2  , left thenar hand  x2, right thenar hand x 1 (13) Erythematous stuck-on, waxy papule  Assessment & Plan   SEBORRHEIC KERATOSIS - Stuck-on, waxy, tan-brown papules and/or plaques  - Benign-appearing - Discussed benign etiology and prognosis. - Observe - Call for any changes  HEMANGIOMA Exam: red papule(s) Discussed benign nature. Recommend observation. Call for changes.  LENTIGINES Exam: scattered tan macules Due to sun exposure Treatment Plan: Benign-appearing, observe. Recommend daily broad spectrum sunscreen SPF 30+ to sun-exposed areas, reapply every 2 hours as needed.  Call for any changes  Xerosis with pruritus - diffuse xerotic patches arms - recommend gentle, hydrating skin care - gentle skin care handout given  - recommend Dove soap and CeraVe anti itch cream  ACTINIC DAMAGE - chronic, secondary to cumulative UV radiation exposure/sun exposure over time - diffuse scaly erythematous macules with underlying dyspigmentation -  Recommend daily broad spectrum sunscreen SPF 30+ to sun-exposed areas, reapply every 2 hours as needed.  - Recommend staying in the shade or wearing long sleeves, sun glasses (UVA+UVB protection) and wide brim hats (4-inch brim around the entire circumference of the hat). - Call for new or changing lesions.  HISTORY OF INFLAMED SEBORRHEIC KERATOSIS - clear today at left zygoma and right temple  - observe for recurrence  INFLAMED SEBORRHEIC KERATOSIS (13) left spinal mid back x 8, b/l forearms x 2  , left thenar hand  x2, right thenar hand x 1 (13) Symptomatic, irritating, patient would like treated. Destruction of lesion - left spinal mid back x 8, b/l forearms x 2  , left thenar hand  x2, right thenar hand x 1 (13)  Destruction method: cryotherapy   Informed consent: discussed and consent obtained   Lesion destroyed using liquid nitrogen: Yes   Region frozen until ice ball extended beyond lesion: Yes   Outcome: patient tolerated procedure well with no complications   Post-procedure details: wound care instructions given   Additional details:  Prior to procedure, discussed risks of blister formation, small wound, skin dyspigmentation, or rare scar following cryotherapy. Recommend Vaseline ointment to treated areas while healing.   Return for keep followup as scheduled in june .  I, Asher Muir, CMA, am acting as scribe for Willeen Niece, MD.   Documentation: I have reviewed the above documentation for accuracy and completeness, and I agree with the above.  Willeen Niece, MD

## 2023-09-28 ENCOUNTER — Encounter: Payer: Self-pay | Admitting: Cardiology

## 2023-09-28 ENCOUNTER — Ambulatory Visit: Attending: Cardiology | Admitting: Cardiology

## 2023-09-28 VITALS — BP 138/72 | HR 88 | Ht 66.0 in | Wt 172.6 lb

## 2023-09-28 DIAGNOSIS — I25118 Atherosclerotic heart disease of native coronary artery with other forms of angina pectoris: Secondary | ICD-10-CM | POA: Diagnosis not present

## 2023-09-28 DIAGNOSIS — I6522 Occlusion and stenosis of left carotid artery: Secondary | ICD-10-CM | POA: Diagnosis not present

## 2023-09-28 DIAGNOSIS — I10 Essential (primary) hypertension: Secondary | ICD-10-CM | POA: Insufficient documentation

## 2023-09-28 DIAGNOSIS — E785 Hyperlipidemia, unspecified: Secondary | ICD-10-CM | POA: Diagnosis not present

## 2023-09-28 DIAGNOSIS — R002 Palpitations: Secondary | ICD-10-CM | POA: Diagnosis not present

## 2023-09-28 NOTE — Progress Notes (Signed)
 Cardiology Office Note:  .   Date:  09/28/2023  ID:  Patty Shelton, DOB 1940/10/15, MRN [id number] PCP: Excell Seltzer, MD  [facility] Providers Cardiologist:  None    History of Present Illness: Marland Kitchen   Patty Shelton is a 83 y.o. female with a past medical history of hypertension, hyperlipidemia, longstanding interstitial lung disease/bronchiectasis, asthma, dysphagia, GERD, hearing loss, osteopenia, CKD stage III, who is here today for follow-up.   Patient had an echocardiogram done 05/2021 which revealed an LVEF of 55 to 60%, no RWMA, G1 DD, and mild LA dilatation.  Normal RV size and function and normal estimated RVSP and RAP.  Coronary CTA in 04/2021 revealed calcium score of 20 with minimal proximal dominant RCA calcified plaque at 0-24% with no plaque noted in the LAD and nondominant left circumflex.  Normal PA size and mild aortic root and ascending aortic calcification evaluation for dissection with no significant extracardiac findings.  She was last seen in clinic by Dr. Herbie Baltimore 07/08/2021.  She was doing well recovering from a minor illness over the holidays but still having DOE.  She was seen by pulmonary for evaluation.  There were no medication changes that were made.  It was recommended she keep her target LDL less than 100.  With essentially normal cardiac evaluation it was recommended she continue to follow-up with her PCP and pulmonary and follow-up with cardiology on an as-needed basis.  She presented to the [facility] emergency department 08/01/2023 with complaints of chest pain.  She previously been diagnosed with influenza A prior to her visit.  Initial vitals revealed blood pressure 150/85, pulse of 82, respirations of 17, temperature 98.2.  Patient labs revealed sodium 131, blood glucose 117, serum creatinine 1.05, calcium 8.8, high-sensitivity troponins were negative x 2.  EKG revealed sinus rhythm with rate of 70 normal QRS with T wave flattening in aVL, no significant change,  chest x-ray revealed no focal consolidation.   She was last seen in clinic 08/17/2023 continuing to have complaints of chest discomfort for several weeks.  Primary care provider just recently increased HCTZ to 12.5 mg daily.  Repeat sodium was improved and she was continued on her current medication regimen.  Started on Toprol-XL 12.5 mg daily to help with palpitations.  And ordered for carotid duplex for newfound carotid bruit.  She returns to clinic today stating that overall she has been doing well.  She has recently had a trip to the beach with her husband.  She has noted on 1 instance when she was at the beach that her fingertips became cool and were numb.  Unfortunately she had not been taking her Toprol-XL as prescribed as she had limited at home when she had gone to the beach.  She wanted to make sure it was not as she continues to be sleep since oxygen.  Denies any chest discomfort, shortness of breath, palpitations or lightheadedness.  Denies any hospitalization or visits to the emergency department.  ROS: 10 point review of systems has been reviewed and considered negative except ones been listed in the HPI  Studies Reviewed: .       Carotid Duplex 09/13/2023 Summary:  Right Carotid: There is no evidence of stenosis in the right ICA.   Left Carotid: Velocities in the left ICA are consistent with a 1-39%  stenosis.   Vertebrals: Bilateral vertebral arteries demonstrate antegrade flow.  Subclavians: Normal flow hemodynamics were seen in bilateral subclavian  arteries.   2D echo 05/27/2021 1. Left ventricular ejection fraction, by estimation, is 55 to 60%. The  left ventricle has normal function. The left ventricle has no regional  wall motion abnormalities. Left ventricular diastolic parameters are  consistent with Grade I diastolic  dysfunction (impaired relaxation).   2. Right ventricular systolic function is normal. The right ventricular  size is normal.   3. Left  atrial size was mildly dilated.   4. The mitral valve is normal in structure. No evidence of mitral valve  regurgitation.   5. The aortic valve is tricuspid. Aortic valve regurgitation is not  visualized.   6. The inferior vena cava is normal in size with greater than 50%  respiratory variability, suggesting right atrial pressure of 3 mmHg.    cCTA 05/03/2021 IMPRESSION: 1. Coronary calcium score of 26.9. This was 28th percentile for age and sex matched control.   2. Normal coronary origin with right dominance.   3. Calcified plaque causing minimal proximal RCA disease.   4. CAD-RADS 1. Minimal non-obstructive CAD (0-24%). Consider non-atherosclerotic causes of chest pain. Consider preventive therapy and risk factor modification.   Risk Assessment/Calculations:             Physical Exam:   VS:  BP 138/72 (BP Location: Left Arm, Patient Position: Sitting, Cuff Size: Normal)   Pulse 88   Ht 5\' 6"  (1.676 m)   Wt 172 lb 9.6 oz (78.3 kg)   SpO2 99%   BMI 27.86 kg/m    Wt Readings from Last 3 Encounters:  09/28/23 172 lb 9.6 oz (78.3 kg)  09/07/23 172 lb (78 kg)  08/17/23 172 lb (78 kg)    GEN: Well nourished, well developed in no acute distress NECK: No JVD; right carotid bruit CARDIAC: RRR, no murmurs, rubs, gallops RESPIRATORY:  Clear to auscultation without rales, wheezing or rhonchi  ABDOMEN: Soft, non-tender, non-distended EXTREMITIES:  No edema; No deformity   ASSESSMENT AND PLAN: .   Primary hypertension with blood pressure today 138/72.  Blood pressure has remain stable.  She is continued on HCTZ 25 mg daily and Toprol-XL 12.5 mg daily.  She has been encouraged to continue to monitor pressures 1 to 2 hours postmedication administration as well.  Palpitations she had noticed with rest and exertion.  Not related to caffeine.  They have been happening less frequently with being started on Toprol-XL 12.5 mg daily.  Mixed hyperlipidemia with recent LDL of 87.  She is  continued on rosuvastatin 10 mg daily.  Ongoing management per PCP.  Coronary atherosclerosis with minimal disease noted on coronary CTA.  Recommendations were to keep LDL less than 100.  Aspirin daily was not recommended due to the extent of the coronary atherosclerosis.  Continue to monitor.  Carotid bruit noted on exam during the right.  Carotid duplex revealed 1-39% stenosis.  She is asymptomatic we will continue to monitor with surveillance studies.       Dispo: Patient to return to clinic to see MD/APP in 3 months or sooner if needed for reevaluation of symptoms.  Signed, Halston Fairclough, NP

## 2023-09-28 NOTE — Patient Instructions (Signed)
 Medication Instructions:  Your physician recommends that you continue on your current medications as directed. Please refer to the Current Medication list given to you today.  *If you need a refill on your cardiac medications before your next appointment, please call your pharmacy*  Lab Work: No labs ordered today  If you have labs (blood work) drawn today and your tests are completely normal, you will receive your results only by: MyChart Message (if you have MyChart) OR A paper copy in the mail If you have any lab test that is abnormal or we need to change your treatment, we will call you to review the results.  Testing/Procedures: No test ordered today   Follow-Up: At [facility]: [facility], you and your health needs are our priority.  As part of our continuing mission to provide you with exceptional heart care, our providers are all part of one team.  This team includes your primary Cardiologist (physician) and Advanced Practice Providers or APPs (Physician Assistants and Nurse Practitioners) who all work together to provide you with the care you need, when you need it.  Your next appointment:    3 month(s) with Dr Addie Holstein  Provider:   You may see Dr Addie Holstein or one of the following Advanced Practice Providers on your designated Care Team:   Laneta Pintos, NP Gildardo Labrador, PA-C Varney Gentleman, PA-C Cadence Linden, PA-C Ronald Cockayne, NP Morey Ar, NP    We recommend signing up for the patient portal called "MyChart".  Sign up information is provided on this After Visit Summary.  MyChart is used to connect with patients for Virtual Visits (Telemedicine).  Patients are able to view lab/test results, encounter notes, upcoming appointments, etc.  Non-urgent messages can be sent to your provider as well.   To learn more about what you can do with MyChart, go to [email or web address].

## 2023-10-12 ENCOUNTER — Ambulatory Visit: Payer: Medicare Other

## 2023-10-12 VITALS — BP 116/64 | Ht 66.5 in | Wt 172.6 lb

## 2023-10-12 DIAGNOSIS — I480 Paroxysmal atrial fibrillation: Secondary | ICD-10-CM

## 2023-10-12 DIAGNOSIS — Z0000 Encounter for general adult medical examination without abnormal findings: Secondary | ICD-10-CM

## 2023-10-12 HISTORY — DX: Paroxysmal atrial fibrillation: I48.0

## 2023-10-12 NOTE — Progress Notes (Signed)
 Subjective:   Patty Shelton is a 83 y.o. who presents for a Medicare Wellness preventive visit.  Visit Complete: In person  Persons Participating in Visit: Patient.  AWV Questionnaire: No: Patient Medicare AWV questionnaire was not completed prior to this visit.  Cardiac Risk Factors include: advanced age (>80men, >16 women);dyslipidemia;hypertension;sedentary lifestyle     Objective:    Today's Vitals   10/12/23 1140  Weight: 172 lb 9.6 oz (78.3 kg)  Height: 5' 6.5" (1.689 m)   Body mass index is 27.44 kg/m.     10/12/2023   11:54 AM 08/01/2023    1:44 PM 05/30/2023    9:52 AM 04/26/2023    4:19 PM 10/11/2022    8:55 AM 08/12/2021    1:23 PM 08/11/2020    1:23 PM  Advanced Directives  Does Patient Have a Medical Advance Directive? No No No No -- No No  Does patient want to make changes to medical advance directive?      Yes (MAU/Ambulatory/Procedural Areas - Information given)   Would patient like information on creating a medical advance directive?  No - Patient declined     No - Patient declined    Current Medications (verified) Outpatient Encounter Medications as of 10/12/2023  Medication Sig   acetaminophen  (TYLENOL ) 500 MG tablet Take 500 mg by mouth every 6 (six) hours as needed.   albuterol  (VENTOLIN  HFA) 108 (90 Base) MCG/ACT inhaler Inhale 2 puffs into the lungs every 4 (four) hours as needed for wheezing or shortness of breath.   Biotin 10 MG TABS Take 1 tablet by mouth daily.   cetirizine (ZYRTEC) 10 MG tablet Take 10 mg by mouth daily as needed for allergies.   Cholecalciferol (VITAMIN D3) 50 MCG (2000 UT) TABS Take 1 tablet by mouth daily.   CRANBERRY PO Take 1 capsule by mouth daily.   diclofenac  sodium (VOLTAREN ) 1 % GEL Apply 4 g topically 4 (four) times daily as needed.   famciclovir  (FAMVIR ) 250 MG tablet TAKE 1 TABLET DAILY   fluticasone  (FLONASE ) 50 MCG/ACT nasal spray Place 2 sprays into both nostrils as needed.    fluticasone -salmeterol (ADVAIR  HFA)  115-21 MCG/ACT inhaler USE 2 INHALATIONS TWICE A DAY   hydrochlorothiazide  (HYDRODIURIL ) 25 MG tablet Take 25 mg by mouth daily.   losartan  (COZAAR ) 100 MG tablet TAKE 1 TABLET DAILY   meloxicam  (MOBIC ) 15 MG tablet Take 15 mg by mouth daily.   methocarbamol (ROBAXIN) 500 MG tablet Take 500 mg by mouth 3 (three) times daily.   metoprolol  succinate (TOPROL  XL) 25 MG 24 hr tablet Take 0.5 tablets (12.5 mg total) by mouth daily.   Multiple Minerals-Vitamins (CAL-MAG-ZINC-D PO) Take 1 tablet by mouth daily.   Respiratory Therapy Supplies (FLUTTER) DEVI 1 Device by Does not apply route as directed.   rosuvastatin  (CRESTOR ) 10 MG tablet TAKE 1 TABLET DAILY   vitamin B-12 (CYANOCOBALAMIN ) 1000 MCG tablet Take 1,000 mcg by mouth daily.   vitamin E 180 MG (400 UNITS) capsule Take 400 Units by mouth daily.   No facility-administered encounter medications on file as of 10/12/2023.    Allergies (verified) Amlodipine  besylate and Lipitor [atorvastatin ]   History: Past Medical History:  Diagnosis Date   Agatston CAC score, <100 05/04/2021   Coronary CTA : Coronary Calcium  Score 27.  Minimal proximal (dominant) RCA calcified plaque (0-24%) with no plaque noted in the LAD and nondominant LCx.   Hyperlipidemia with target LDL less than 100 03/27/2009   Qualifier: Diagnosis of  ByCherlyn Cornet MD, Amy     Hypertension    Past Surgical History:  Procedure Laterality Date   CATARACT EXTRACTION     CT CTA CORONARY W/CA SCORE W/CM &/OR WO/CM  05/04/2021   Coronary Calcium  Score 27.  Minimal proximal (dominant) RCA calcified plaque (0-24%) with no plaque noted in the LAD and nondominant LCx.  Normal PA size.mild aortic root and ascending aortic calcification-evaluation for dissection. (No significant extracardiac findings),   RETINAL DETACHMENT SURGERY     TRANSTHORACIC ECHOCARDIOGRAM  05/27/2021   [facility]: (Normal Study) EF 55 to 60%.  No R WMA.  GR 1 DD with mild LA dilation.  Normal RV size and function-normal  RVP and RAP.  Normal valves.   Family History  Problem Relation Age of Onset   Heart attack Mother 47       Apparently she started having issues at 33, but not sure if she had a heart attack at that time   Coronary artery disease Mother 20   Stroke Father    Hyperlipidemia Father    Dementia Father    Breast cancer Neg Hx    Social History   Socioeconomic History   Marital status: Married    Spouse name: Not on file   Number of children: Not on file   Years of education: Not on file   Highest education level: 12th grade  Occupational History   Not on file  Tobacco Use   Smoking status: Never   Smokeless tobacco: Never  Vaping Use   Vaping status: Never Used  Substance and Sexual Activity   Alcohol use: No    Alcohol/week: 0.0 standard drinks of alcohol   Drug use: No   Sexual activity: Yes  Other Topics Concern   Not on file  Social History Narrative   No living will , no HCPOA. Full code (reviewed 2015, given packet)   Social Drivers of Health   Financial Resource Strain: Low Risk  (10/12/2023)   Overall Financial Resource Strain (CARDIA)    Difficulty of Paying Living Expenses: Not hard at all  Food Insecurity: No Food Insecurity (10/12/2023)   Hunger Vital Sign    Worried About Running Out of Food in the Last Year: Never true    Ran Out of Food in the Last Year: Never true  Transportation Needs: No Transportation Needs (10/12/2023)   PRAPARE - Administrator, Civil Service (Medical): No    Lack of Transportation (Non-Medical): No  Physical Activity: Inactive (10/12/2023)   Exercise Vital Sign    Days of Exercise per Week: 0 days    Minutes of Exercise per Session: 0 min  Stress: No Stress Concern Present (10/12/2023)   [employer] of Occupational Health - Occupational Stress Questionnaire    Feeling of Stress : Not at all  Social Connections: Moderately Integrated (10/12/2023)   Social Connection and Isolation Panel [NHANES]    Frequency of  Communication with Friends and Family: More than three times a week    Frequency of Social Gatherings with Friends and Family: More than three times a week    Attends Religious Services: More than 4 times per year    Active Member of [employer] or Organizations: No    Attends Banker Meetings: Never    Marital Status: Married    Tobacco Counseling Counseling given: Not Answered    Clinical Intake:  Pre-visit preparation completed: Yes  Pain : No/denies pain     BMI - recorded:  27.44 Nutritional Status: BMI 25 -29 Overweight Nutritional Risks: None Diabetes: No  No results found for: "HGBA1C"   How often do you need to have someone help you when you read instructions, pamphlets, or other written materials from your doctor or pharmacy?: 1 - Never  Interpreter Needed?: No  Comments: lives with husband Information entered by :: B.Santiana Glidden,LPN   Activities of Daily Living     10/12/2023   11:55 AM  In your present state of health, do you have any difficulty performing the following activities:  Hearing? 0  Vision? 0  Difficulty concentrating or making decisions? 0  Walking or climbing stairs? 0  Dressing or bathing? 0  Doing errands, shopping? 0  Preparing Food and eating ? N  Using the Toilet? N  In the past six months, have you accidently leaked urine? Y  Do you have problems with loss of bowel control? N  Managing your Medications? N  Managing your Finances? N  Housekeeping or managing your Housekeeping? N    Patient Care Team: Judithann Novas, MD as PCP - General Scherrie Curt, MD as Consulting Physician (Sports Medicine) Elta Halter, MD as Consulting Physician (Dermatology) Corie Diamond, MD as Consulting Physician (Ophthalmology) Pierre Briar., MD as Consulting Physician (Dentistry) Eber Goldsmith Hanley Lew, FNP as Nurse Practitioner (Family Medicine) Arleen Lacer, MD as Consulting Physician (Cardiology)  Indicate any recent Medical  Services you may have received from other than [facility] providers in the past year (date may be approximate).     Assessment:   This is a routine wellness examination for [facility].  Hearing/Vision screen Hearing Screening - Comments:: Pt says her hearing is 80%; sometimes does not hear tones Vision Screening - Comments:: Pt says her vision is good Dr Lenise Quince   Goals Addressed             This Visit's Progress    Follow up with Primary Care Provider       10/12/23- I will continue to take medications as prescribed and to keep appointments with PCP as scheduled.      Patient Stated   On track    10/12/23- I will maintain and continue medications as prescribed.      Patient Stated   On track    10/12/23-Would like to maintain current routine     Patient Stated       10/12/23- I would like to keep my BP controlled and monitor it at home       Depression Screen     10/12/2023   11:49 AM 11/16/2022   10:57 AM 10/11/2022    8:29 AM 08/12/2021    1:28 PM 08/11/2020    1:26 PM 08/02/2019   11:07 AM 07/26/2018    9:45 AM  PHQ 2/9 Scores  PHQ - 2 Score 0 0 0 0 0 0 0  PHQ- 9 Score  7   0 0 0    Fall Risk     10/12/2023   11:46 AM 11/16/2022   10:57 AM 10/11/2022    8:33 AM 08/12/2021    1:25 PM 08/11/2020    1:25 PM  Fall Risk   Falls in the past year? 1 0 0 0 1  Comment winter 2024      Number falls in past yr: 0 0 0 0 0  Injury with Fall? 0 0 0 0 0  Risk for fall due to : No Fall Risks No Fall Risks No Fall Risks No  Fall Risks Medication side effect  Follow up Falls prevention discussed;Education provided Falls evaluation completed Falls prevention discussed Falls prevention discussed Falls evaluation completed;Falls prevention discussed    MEDICARE RISK AT HOME:  Medicare Risk at Home Any stairs in or around the home?: Yes If so, are there any without handrails?: Yes Home free of loose throw rugs in walkways, pet beds, electrical cords, etc?: Yes Adequate lighting in your home to reduce  risk of falls?: Yes Life alert?: No Use of a cane, walker or w/c?: No Grab bars in the bathroom?: Yes Shower chair or bench in shower?: Yes Elevated toilet seat or a handicapped toilet?: Yes  TIMED UP AND GO:  Was the test performed?  Yes  Length of time to ambulate 10 feet: 12 sec Gait steady and fast without use of assistive device  Cognitive Function: 6CIT completed    08/11/2020    1:30 PM 08/02/2019   11:09 AM 07/26/2018    9:45 AM 07/18/2017   10:55 AM 05/19/2016   11:55 AM  MMSE - Mini Mental State Exam  Orientation to time 5 5 5 5 5   Orientation to Place 5 5 5 5 5   Registration 3 3 3 3 3   Attention/ Calculation 5 5 0 0 0  Recall 3 3 3 3 3   Language- name 2 objects   0 0 0  Language- repeat 1 1 1 1 1   Language- follow 3 step command   3 3 3   Language- read & follow direction   0 0 0  Write a sentence   0 0 0  Copy design   0 0 0  Total score   20 20 20         10/12/2023   12:00 PM 10/11/2022    8:42 AM  6CIT Screen  What Year? 0 points 0 points  What month? 0 points 0 points  What time? 0 points 0 points  Count back from 20 0 points 0 points  Months in reverse 0 points 0 points  Repeat phrase 0 points 0 points  Total Score 0 points 0 points    Immunizations Immunization History  Administered Date(s) Administered   Fluad Quad(high Dose 65+) 03/14/2019, 04/02/2020   Influenza Split 05/24/2011, 03/01/2013, 04/24/2015   Influenza Whole 03/27/2009   Influenza, High Dose Seasonal PF 02/29/2016, 04/07/2017, 05/24/2018   Influenza,inj,Quad PF,6+ Mos 04/03/2014   Pneumococcal Conjugate-13 04/03/2014, 04/07/2017   Pneumococcal Polysaccharide-23 03/27/2009   Tdap 03/11/2011   Zoster, Live 06/02/2014    Screening Tests Health Maintenance  Topic Date Due   COVID-19 Vaccine (1) Never done   Zoster Vaccines- Shingrix (1 of 2) 05/28/1960   DTaP/Tdap/Td (2 - Td or Tdap) 03/10/2021   INFLUENZA VACCINE  01/12/2024   MAMMOGRAM  08/03/2024   DEXA SCAN  09/03/2024    Medicare Annual Wellness (AWV)  10/11/2024   Pneumonia Vaccine 24+ Years old  Completed   HPV VACCINES  Aged Out   Meningococcal B Vaccine  Aged Out    Health Maintenance  Health Maintenance Due  Topic Date Due   COVID-19 Vaccine (1) Never done   Zoster Vaccines- Shingrix (1 of 2) [birth year]1959   DTaP/Tdap/Td (2 - Td or Tdap) 03/10/2021   Health Maintenance Items Addressed:   Additional Screening:  Vision Screening: Recommended annual ophthalmology exams for early detection of glaucoma and other disorders of the eye.  Dental Screening: Recommended annual dental exams for proper oral hygiene  Community Resource Referral / Chronic Care Management:  CRR required this visit?  No   CCM required this visit?  No    Plan:     I have personally reviewed and noted the following in the patient's chart:   Medical and social history Use of alcohol, tobacco or illicit drugs  Current medications and supplements including opioid prescriptions. Patient is not currently taking opioid prescriptions. Functional ability and status Nutritional status Physical activity Advanced directives List of other physicians Hospitalizations, surgeries, and ER visits in previous 12 months Vitals Screenings to include cognitive, depression, and falls Referrals and appointments  In addition, I have reviewed and discussed with patient certain preventive protocols, quality metrics, and best practice recommendations. A written personalized care plan for preventive services as well as general preventive health recommendations were provided to patient.    Nerissa Bannister, LPN   4/0/9811   After Visit Summary: (In Person-Declined) Patient declined AVS at this time.  Notes: Nothing significant to report at this time.

## 2023-10-12 NOTE — Patient Instructions (Signed)
 Patty Shelton , Thank you for taking time to come for your Medicare Wellness Visit. I appreciate your ongoing commitment to your health goals. Please review the following plan we discussed and let me know if I can assist you in the future.   Referrals/Orders/Follow-Ups/Clinician Recommendations: none  This is a list of the screening recommended for you and due dates:  Health Maintenance  Topic Date Due   COVID-19 Vaccine (1) Never done   Zoster (Shingles) Vaccine (1 of 2) [birth year]28[birth year]1959   DTaP/Tdap/Td vaccine (2 - Td or Tdap) 03/10/2021   Flu Shot  01/12/2024   Mammogram  08/03/2024   DEXA scan (bone density measurement)  09/03/2024   Medicare Annual Wellness Visit  10/11/2024   Pneumonia Vaccine  Completed   HPV Vaccine  Aged Out   Meningitis B Vaccine  Aged Out    Advanced directives: (Declined) Advance directive discussed with you today. Even though you declined this today, please call our office should you change your mind, and we can give you the proper paperwork for you to fill out.  Next Medicare Annual Wellness Visit scheduled for next year: Yes 10/15/2024 @ 9:30am in person

## 2023-10-13 ENCOUNTER — Encounter: Payer: Self-pay | Admitting: Emergency Medicine

## 2023-10-13 ENCOUNTER — Other Ambulatory Visit: Payer: Self-pay

## 2023-10-13 ENCOUNTER — Observation Stay
Admission: EM | Admit: 2023-10-13 | Discharge: 2023-10-14 | Disposition: A | Discharge status: Home / Self Care | Attending: Internal Medicine | Admitting: Internal Medicine

## 2023-10-13 ENCOUNTER — Emergency Department

## 2023-10-13 DIAGNOSIS — Y92009 Unspecified place in unspecified non-institutional (private) residence as the place of occurrence of the external cause: Secondary | ICD-10-CM

## 2023-10-13 DIAGNOSIS — Z6829 Body mass index (BMI) 29.0-29.9, adult: Secondary | ICD-10-CM | POA: Insufficient documentation

## 2023-10-13 DIAGNOSIS — J849 Interstitial pulmonary disease, unspecified: Secondary | ICD-10-CM | POA: Diagnosis present

## 2023-10-13 DIAGNOSIS — I5032 Chronic diastolic (congestive) heart failure: Secondary | ICD-10-CM | POA: Diagnosis present

## 2023-10-13 DIAGNOSIS — I2510 Atherosclerotic heart disease of native coronary artery without angina pectoris: Secondary | ICD-10-CM | POA: Insufficient documentation

## 2023-10-13 DIAGNOSIS — E663 Overweight: Secondary | ICD-10-CM | POA: Diagnosis present

## 2023-10-13 DIAGNOSIS — R008 Other abnormalities of heart beat: Secondary | ICD-10-CM | POA: Diagnosis not present

## 2023-10-13 DIAGNOSIS — S70212A Abrasion, left hip, initial encounter: Secondary | ICD-10-CM | POA: Diagnosis not present

## 2023-10-13 DIAGNOSIS — S5012XA Contusion of left forearm, initial encounter: Secondary | ICD-10-CM | POA: Diagnosis not present

## 2023-10-13 DIAGNOSIS — R58 Hemorrhage, not elsewhere classified: Secondary | ICD-10-CM

## 2023-10-13 DIAGNOSIS — T148XXA Other injury of unspecified body region, initial encounter: Secondary | ICD-10-CM

## 2023-10-13 DIAGNOSIS — I4891 Unspecified atrial fibrillation: Secondary | ICD-10-CM | POA: Diagnosis not present

## 2023-10-13 DIAGNOSIS — I130 Hypertensive heart and chronic kidney disease with heart failure and stage 1 through stage 4 chronic kidney disease, or unspecified chronic kidney disease: Secondary | ICD-10-CM | POA: Insufficient documentation

## 2023-10-13 DIAGNOSIS — Z79899 Other long term (current) drug therapy: Secondary | ICD-10-CM | POA: Insufficient documentation

## 2023-10-13 DIAGNOSIS — W19XXXA Unspecified fall, initial encounter: Secondary | ICD-10-CM | POA: Diagnosis not present

## 2023-10-13 DIAGNOSIS — E785 Hyperlipidemia, unspecified: Secondary | ICD-10-CM | POA: Diagnosis not present

## 2023-10-13 DIAGNOSIS — I10 Essential (primary) hypertension: Secondary | ICD-10-CM | POA: Diagnosis present

## 2023-10-13 DIAGNOSIS — N1831 Chronic kidney disease, stage 3a: Secondary | ICD-10-CM | POA: Diagnosis not present

## 2023-10-13 DIAGNOSIS — J45909 Unspecified asthma, uncomplicated: Secondary | ICD-10-CM | POA: Diagnosis present

## 2023-10-13 DIAGNOSIS — J479 Bronchiectasis, uncomplicated: Secondary | ICD-10-CM | POA: Diagnosis not present

## 2023-10-13 DIAGNOSIS — W010XXA Fall on same level from slipping, tripping and stumbling without subsequent striking against object, initial encounter: Secondary | ICD-10-CM | POA: Diagnosis not present

## 2023-10-13 DIAGNOSIS — N179 Acute kidney failure, unspecified: Secondary | ICD-10-CM | POA: Diagnosis not present

## 2023-10-13 DIAGNOSIS — M25552 Pain in left hip: Secondary | ICD-10-CM | POA: Diagnosis not present

## 2023-10-13 DIAGNOSIS — R9431 Abnormal electrocardiogram [ECG] [EKG]: Secondary | ICD-10-CM | POA: Diagnosis not present

## 2023-10-13 DIAGNOSIS — E871 Hypo-osmolality and hyponatremia: Secondary | ICD-10-CM | POA: Diagnosis present

## 2023-10-13 LAB — COMPREHENSIVE METABOLIC PANEL WITH GFR
ALT: 20 U/L (ref 0–44)
AST: 22 U/L (ref 15–41)
Albumin: 4.2 g/dL (ref 3.5–5.0)
Alkaline Phosphatase: 50 U/L (ref 38–126)
Anion gap: 12 (ref 5–15)
BUN: 35 mg/dL — ABNORMAL HIGH (ref 8–23)
CO2: 21 mmol/L — ABNORMAL LOW (ref 22–32)
Calcium: 8.9 mg/dL (ref 8.9–10.3)
Chloride: 96 mmol/L — ABNORMAL LOW (ref 98–111)
Creatinine, Ser: 1.46 mg/dL — ABNORMAL HIGH (ref 0.44–1.00)
GFR, Estimated: 35 mL/min — ABNORMAL LOW (ref >60)
Glucose, Bld: 132 mg/dL — ABNORMAL HIGH (ref 70–99)
Potassium: 4.1 mmol/L (ref 3.5–5.1)
Sodium: 129 mmol/L — ABNORMAL LOW (ref 135–145)
Total Bilirubin: 0.6 mg/dL (ref 0.0–1.2)
Total Protein: 6.6 g/dL (ref 6.5–8.1)

## 2023-10-13 LAB — CBC
HCT: 36.7 % (ref 36.0–46.0)
Hemoglobin: 12.8 g/dL (ref 12.0–15.0)
MCH: 31.8 pg (ref 26.0–34.0)
MCHC: 34.9 g/dL (ref 30.0–36.0)
MCV: 91.1 fL (ref 80.0–100.0)
Platelets: 219 10*3/uL (ref 150–400)
RBC: 4.03 MIL/uL (ref 3.87–5.11)
RDW: 12.1 % (ref 11.5–15.5)
WBC: 9.3 10*3/uL (ref 4.0–10.5)
nRBC: 0 % (ref 0.0–0.2)

## 2023-10-13 LAB — TROPONIN I (HIGH SENSITIVITY)
Troponin I (High Sensitivity): 7 ng/L (ref <18)
Troponin I (High Sensitivity): 8 ng/L (ref <18)

## 2023-10-13 MED ORDER — ACETAMINOPHEN 325 MG PO TABS: 650.0000 mg | ORAL_TABLET | Freq: Four times a day (QID) | ORAL | Status: DC | PRN

## 2023-10-13 MED ORDER — ACETAMINOPHEN 500 MG PO TABS
1000.0000 mg | ORAL_TABLET | Freq: Once | ORAL | Status: AC
  Administered 2023-10-13: 1000 mg via ORAL
  Filled 2023-10-13: qty 2

## 2023-10-13 MED ORDER — ALBUTEROL SULFATE (2.5 MG/3ML) 0.083% IN NEBU: 2.5000 mg | INHALATION_SOLUTION | RESPIRATORY_TRACT | Status: DC | PRN

## 2023-10-13 MED ORDER — ONDANSETRON HCL 4 MG/2ML IJ SOLN
4.0000 mg | Freq: Three times a day (TID) | INTRAMUSCULAR | Status: DC | PRN
Start: 2023-10-13 — End: 2023-10-14

## 2023-10-13 MED ORDER — SODIUM CHLORIDE 0.9 % IV SOLN: INTRAVENOUS | Status: DC

## 2023-10-13 MED ORDER — HYDRALAZINE HCL 20 MG/ML IJ SOLN: 5.0000 mg | INTRAMUSCULAR | Status: DC | PRN

## 2023-10-13 MED ORDER — DM-GUAIFENESIN ER 30-600 MG PO TB12: 1.0000 | ORAL_TABLET | Freq: Two times a day (BID) | ORAL | Status: DC | PRN

## 2023-10-13 MED ORDER — LACTATED RINGERS IV BOLUS
1000.0000 mL | Freq: Once | INTRAVENOUS | Status: AC
  Administered 2023-10-13: 1000 mL via INTRAVENOUS

## 2023-10-13 MED ORDER — SODIUM CHLORIDE 1 G PO TABS
1.0000 g | ORAL_TABLET | Freq: Two times a day (BID) | ORAL | Status: DC
  Administered 2023-10-13 – 2023-10-14 (×2): 1 g via ORAL
  Filled 2023-10-13 (×2): qty 1

## 2023-10-13 MED ORDER — ALBUTEROL SULFATE HFA 108 (90 BASE) MCG/ACT IN AERS: 2.0000 | INHALATION_SPRAY | RESPIRATORY_TRACT | Status: DC | PRN

## 2023-10-13 NOTE — H&P (Signed)
 History and Physical    [name]:[id number] DOB: 1940-10-24 DOA: 10/13/2023  Referring MD/NP/PA:   PCP: Judithann Novas, MD   Patient coming from:  The patient is coming from home.     Chief Complaint: fall  HPI: Patty Shelton is a 83 y.o. female with medical history significant of hypertension, hyperlipidemia, asthma, interstitial lung disease, CAD, diastolic CHF, GERD, CKD stage IIIa, sciatica, who presents with fall.  Pt states that she accidentally fell when was watering her plants and tripped steps in the evening. She fell onto L side vs concrete. She injured her left forearm and the left hip.  She has bruise and mild skin tear to left forearm, and bruise to left hip.  No LOC.  She has mild dizziness. She strongly denies head or neck injury.  She refused CT scan of head and neck.  Patient does not have chest pain, cough, SOB.  No nausea, vomiting, diarrhea or abdominal pain.  Denies symptoms of UTI.  Data reviewed independently and ED Course: pt was found to have WBC 9.3, sodium 129, Mg 1.9, worsening renal function, troponin 8, temperature normal, blood pressure 164/101, 180/74, heart rate 88, RR 98, oxygen saturation 98% on room air.  X-ray of left hip and left forearm negative for acute fracture.  Patient is placed in telemetry bed for observation.  EKG: I have personally reviewed.  Patient has abnormal EKG, sinus rhythm with frequent Premature ventricular complexes in a pattern of bigeminy, QTc 467, poor R wave progression.  The repeated the EKG seem to be sinus rhythm with frequent PAC to me.   Review of Systems:   General: no fevers, chills, no body weight gain, has fatigue HEENT: no blurry vision, hearing changes or sore throat Respiratory: no dyspnea, coughing, wheezing CV: no chest pain, no palpitations GI: no nausea, vomiting, abdominal pain, diarrhea, constipation GU: no dysuria, burning on urination, increased urinary frequency, hematuria  Ext: no leg  edema Neuro: no unilateral weakness, numbness, or tingling, no vision change or hearing loss. Has fall and mild dizziness Skin: Has bruise to left forearm and left hip, has small skin tear to left forearm MSK: No muscle spasm, no deformity, no limitation of range of movement in spin Heme: No easy bruising.  Travel history: No recent long distant travel.   Allergy:  Allergies  Allergen Reactions   Amlodipine  Besylate Swelling   Lipitor [Atorvastatin ] Other (See Comments)    Aches at 40mg  dose Leg cramps     Past Medical History:  Diagnosis Date   Agatston CAC score, <100 05/04/2021   Coronary CTA : Coronary Calcium  Score 27.  Minimal proximal (dominant) RCA calcified plaque (0-24%) with no plaque noted in the LAD and nondominant LCx.   Hyperlipidemia with target LDL less than 100 03/27/2009   Qualifier: Diagnosis of  By: Cherlyn Cornet MD, Amy     Hypertension     Past Surgical History:  Procedure Laterality Date   CATARACT EXTRACTION     CT CTA CORONARY W/CA SCORE W/CM &/OR WO/CM  05/04/2021   Coronary Calcium  Score 27.  Minimal proximal (dominant) RCA calcified plaque (0-24%) with no plaque noted in the LAD and nondominant LCx.  Normal PA size.mild aortic root and ascending aortic calcification-evaluation for dissection. (No significant extracardiac findings),   RETINAL DETACHMENT SURGERY     TRANSTHORACIC ECHOCARDIOGRAM  05/27/2021   [facility]: (Normal Study) EF 55 to 60%.  No R WMA.  GR 1 DD with mild LA dilation.  Normal RV size and function-normal RVP and RAP.  Normal valves.    Social History:  reports that she has never smoked. She has never used smokeless tobacco. She reports that she does not drink alcohol and does not use drugs.  Family History:  Family History  Problem Relation Age of Onset   Heart attack Mother 43       Apparently she started having issues at 73, but not sure if she had a heart attack at that time   Coronary artery disease Mother 61   Stroke Father     Hyperlipidemia Father    Dementia Father    Breast cancer Neg Hx      Prior to Admission medications   Medication Sig Start Date End Date Taking? Authorizing Provider  acetaminophen  (TYLENOL ) 500 MG tablet Take 500 mg by mouth every 6 (six) hours as needed.    [provider]  albuterol  (VENTOLIN  HFA) 108 (90 Base) MCG/ACT inhaler Inhale 2 puffs into the lungs every 4 (four) hours as needed for wheezing or shortness of breath. 11/24/22   Kasa, Kurian, MD  Biotin 10 MG TABS Take 1 tablet by mouth daily.    [provider]  cetirizine (ZYRTEC) 10 MG tablet Take 10 mg by mouth daily as needed for allergies.    [provider]  Cholecalciferol (VITAMIN D3) 50 MCG (2000 UT) TABS Take 1 tablet by mouth daily.    [provider]  CRANBERRY PO Take 1 capsule by mouth daily.    [provider]  diclofenac  sodium (VOLTAREN ) 1 % GEL Apply 4 g topically 4 (four) times daily as needed.    [provider]  famciclovir  (FAMVIR ) 250 MG tablet TAKE 1 TABLET DAILY 08/21/23   Bedsole, Amy E, MD  fluticasone  (FLONASE ) 50 MCG/ACT nasal spray Place 2 sprays into both nostrils as needed.     [provider]  fluticasone -salmeterol (ADVAIR  HFA) 115-21 MCG/ACT inhaler USE 2 INHALATIONS TWICE A DAY 07/25/23   Bedsole, Amy E, MD  hydrochlorothiazide  (HYDRODIURIL ) 25 MG tablet Take 25 mg by mouth daily.    [provider]  losartan  (COZAAR ) 100 MG tablet TAKE 1 TABLET DAILY 06/30/23   Bedsole, Amy E, MD  meloxicam  (MOBIC ) 15 MG tablet Take 15 mg by mouth daily.    [provider]  methocarbamol (ROBAXIN) 500 MG tablet Take 500 mg by mouth 3 (three) times daily.    [provider]  metoprolol  succinate (TOPROL  XL) 25 MG 24 hr tablet Take 0.5 tablets (12.5 mg total) by mouth daily. 08/17/23   Ronald Cockayne, NP  Multiple Minerals-Vitamins (CAL-MAG-ZINC-D PO) Take 1 tablet by mouth daily.    [provider]  Respiratory Therapy  Supplies (FLUTTER) DEVI 1 Device by Does not apply route as directed. 10/15/18   Kasa, Kurian, MD  rosuvastatin  (CRESTOR ) 10 MG tablet TAKE 1 TABLET DAILY 08/25/23   Bedsole, Amy E, MD  vitamin B-12 (CYANOCOBALAMIN ) 1000 MCG tablet Take 1,000 mcg by mouth daily.    [provider]  vitamin E 180 MG (400 UNITS) capsule Take 400 Units by mouth daily.    [provider]    Physical Exam: Vitals:   10/13/23 2300 10/13/23 2330 10/13/23 2350 10/14/23 0000  BP: (!) 170/88 (!) 180/74  (!) 180/86  Pulse: (!) 105 93  86  Resp: 11 17  (!) 22  Temp:   98 F (36.7 C)   TempSrc:   Oral   SpO2: 100% 100%  98%  Weight:       General: Not in acute distress.  Dry mucous membrane HEENT:       Eyes: PERRL, EOMI, no jaundice       ENT: No discharge from the ears and nose, no pharynx injection, no tonsillar enlargement.        Neck: No JVD, no bruit, no mass felt. Heme: No neck lymph node enlargement. Cardiac: S1/S2, No murmurs, No gallops or rubs. Respiratory: No rales, wheezing, rhonchi or rubs. GI: Soft, nondistended, nontender, no rebound pain, no organomegaly, BS present. GU: No hematuria Ext: No pitting leg edema bilaterally. 1+DP/PT pulse bilaterally. Musculoskeletal: No joint deformities, No joint redness or warmth, no limitation of ROM in spin. Skin: Has bruise to left forearm and left hip, has small skin tear to left forearm Neuro: Alert, oriented X3, cranial nerves II-XII grossly intact, moves all extremities normally. Psych: Patient is not psychotic, no suicidal or hemocidal ideation.  Labs on Admission: I have personally reviewed following labs and imaging studies  CBC: Recent Labs  Lab 10/13/23 2002  WBC 9.3  HGB 12.8  HCT 36.7  MCV 91.1  PLT 219   Basic Metabolic Panel: Recent Labs  Lab 10/13/23 2002 10/13/23 2005  NA 129*  --   K 4.1  --   CL 96*  --   CO2 21*  --   GLUCOSE 132*  --   BUN 35*  --   CREATININE 1.46*  --   CALCIUM  8.9  --   MG  --   1.9   GFR: Estimated Creatinine Clearance: 31.6 mL/min (A) (by C-G formula based on SCr of 1.46 mg/dL (H)). Liver Function Tests: Recent Labs  Lab 10/13/23 2002  AST 22  ALT 20  ALKPHOS 50  BILITOT 0.6  PROT 6.6  ALBUMIN 4.2   No results for input(s): "LIPASE", "AMYLASE" in the last 168 hours. No results for input(s): "AMMONIA" in the last 168 hours. Coagulation Profile: No results for input(s): "INR", "PROTIME" in the last 168 hours. Cardiac Enzymes: No results for input(s): "CKTOTAL", "CKMB", "CKMBINDEX", "TROPONINI" in the last 168 hours. BNP (last 3 results) No results for input(s): "PROBNP" in the last 8760 hours. HbA1C: No results for input(s): "HGBA1C" in the last 72 hours. CBG: No results for input(s): "GLUCAP" in the last 168 hours. Lipid Profile: No results for input(s): "CHOL", "HDL", "LDLCALC", "TRIG", "CHOLHDL", "LDLDIRECT" in the last 72 hours. Thyroid  Function Tests: Recent Labs    10/13/23 2005  TSH 4.825*  FREET4 0.85   Anemia Panel: No results for input(s): "VITAMINB12", "FOLATE", "FERRITIN", "TIBC", "IRON", "RETICCTPCT" in the last 72 hours. Urine analysis:    Component Value Date/Time   BILIRUBINUR small (A) 11/23/2021 1440   BILIRUBINUR negative 09/14/2018 1151   KETONESUR negative 11/23/2021 1440   PROTEINUR >=300 (A) 11/23/2021 1440   PROTEINUR Positive (A) 09/14/2018 1151   UROBILINOGEN 0.2 11/23/2021 1440   NITRITE Negative 11/23/2021 1440   NITRITE negative 09/14/2018 1151   LEUKOCYTESUR Large (3+) (A) 11/23/2021 1440   Sepsis Labs: @LABRCNTIP (procalcitonin:4,lacticidven:4) )No results found for this or any previous visit (from the past 240 hours).   Radiological Exams on Admission:   Assessment/Plan Principal Problem:   Hyponatremia Active Problems:   Fall at home, initial encounter   Abnormal EKG   Primary hypertension   Chronic diastolic CHF (congestive heart failure) (HCC)   HLD (hyperlipidemia)   Acute renal failure  superimposed on stage 3a chronic kidney disease (HCC)   Bronchiectasis without  complication (HCC)   ILD (interstitial lung disease) (HCC)   Asthma   Overweight (BMI 25.0-29.9)   Assessment and Plan:   Hyponatremia: Sodium 129.  Mental status normal.  May be due to dehydration and continuation of HCTZ and Cozaar . - Placed in telemetry bed for observation - Sodium chloride  tablet 1 g twice daily - IV fluid: 1 L LR in ED, then 75 cc/h of normal saline - Fluid restriction to 1200 - Hold her HCTZ and Cozaar   Fall at home, initial encounter: - Fall precaution - PT/OT  Abnormal EKG: first EKG showed sinus rhythm with frequent Premature ventricular complexes in a pattern of bigeminy, QTc 467, poor R wave progression.  The repeated the EKG seem to be in sinus rhythm with frequent PAC to me, though machine read as A-fib.  TSH 4.825 which is slightly elevated, free 4 is 0.85 Which is normal -tele monitoring - Continue home metoprolol   Primary hypertension -Hold HCTZ and Cozaar  due to worsening renal function and hyponatremia  Chronic diastolic CHF (congestive heart failure) (HCC): 2D echo on 05/27/2021 showed EF of 55 to 60%.  Patient does not have leg edema or JVD.  BNP 105.  CHF is compensated. - Watch volume status closely  HLD (hyperlipidemia) -Crestor   Acute renal failure superimposed on stage 3a chronic kidney disease (HCC): Baseline creatinine 1.07 on 08/09/2023.  Her creatinine is 1.46, BUN 35, GFR 35. - IV fluid as above - Hold HCTZ and Cozaar   Bronchiectasis without complication (HCC),  ILD (interstitial lung disease) (HCC) and Asthma: Stable.  No wheezing, no shortness of breath -Bronchodilators as needed Mucinex   Overweight (BMI 25.0-29.9): Body weight 80.7 kg, BMI 28.30 - Encourage losing weight - Exercise healthy diet       DVT ppx: SQ Lovenox  Code Status: Full code   Family Communication:   Yes, patient's husband  at bed side.   Disposition Plan:   Anticipate discharge back to previous environment  Consults called:  none  Admission status and Level of care: Telemetry Medical:    for obs     Dispo: The patient is from: Home              Anticipated d/c is to: Home              Anticipated d/c date is: 1 day              Patient currently is not medically stable to d/c.    Severity of Illness:  The appropriate patient status for this patient is OBSERVATION. Observation status is judged to be reasonable and necessary in order to provide the required intensity of service to ensure the patient's safety. The patient's presenting symptoms, physical exam findings, and initial radiographic and laboratory data in the context of their medical condition is felt to place them at decreased risk for further clinical deterioration. Furthermore, it is anticipated that the patient will be medically stable for discharge from the hospital within 2 midnights of admission.        Date of Service 10/14/2023    Fidencio Hue [facility]   If 7PM-7AM, please contact night-coverage [email or web address] 10/14/2023, 2:01 AM

## 2023-10-13 NOTE — ED Triage Notes (Addendum)
 Pt in ambulatory with c/o trip fall while out watering her plants tonight. Fall onto L side vs concrete, has abrasion to L forearm, reported bruising to L hip and L back. Denies any LOC, thinners or head/neck pain. Felt a little dizzy after fall - EKG shows frequent PVC's in triage

## 2023-10-13 NOTE — ED Provider Notes (Signed)
 [facility] Provider Note    Event Date/Time   First MD Initiated Contact with Patient 10/13/23 2104     (approximate)   History   Fall   HPI  Patty Shelton is a 83 y.o. female with history of hypertension, hyperlipidemia, not on blood thinners presenting with left hip pain after fall.  Patient states that she went out to water plants, stood up, felt lightheaded, did not pass out but she did fall onto her left side.  Did not hit her head or pass out.  Has some bruising and abrasions to her left forearm.  Was able to ambulate after the fall.  She denies any weakness or numbness.  No chest pain or shortness of breath, no urinary symptoms, no lightheadedness at this time.  She denies any headaches, nausea, vomiting, chest pain, shortness of breath, diarrhea.  Thinks that she stood up too fast hence the lightheadedness.  Independent history obtained from husband, incident occurred around 6:00 tonight.  On independent chart review, she was seen by her cardiologist in mid April, she has been taking on metoprolol  XL as well as hydrochlorothiazide , has history of CAD with minimal disease noted on coronary CTA.     Physical Exam   Triage Vital Signs: ED Triage Vitals  Encounter Vitals Group     BP 10/13/23 1954 (!) 164/101     Systolic BP Percentile --      Diastolic BP Percentile --      Pulse Rate 10/13/23 2001 88     Resp 10/13/23 1954 20     Temp 10/13/23 1954 98.2 F (36.8 C)     Temp Source 10/13/23 1954 Oral     SpO2 10/13/23 1954 98 %     Weight 10/13/23 1957 178 lb (80.7 kg)     Height --      Head Circumference --      Peak Flow --      Pain Score 10/13/23 1956 5     Pain Loc --      Pain Education --      Exclude from Growth Chart --     Most recent vital signs: Vitals:   10/13/23 1954 10/13/23 2001  BP: (!) 164/101   Pulse:  88  Resp: 20   Temp: 98.2 F (36.8 C)   SpO2: 98%     \ General: Awake, no distress.  CV:  Good  peripheral perfusion. \\ Resp:  Normal effort.  Abd:  No distention.  Soft nontender Other:  No palpable skull deformities or tenderness, no midline spinal tenderness, no facial deformities or tenderness, no thoracic cage tenderness, she has some mild tenderness to her left anterior hip, she has mild tenderness to her left proximal forearm with overlying ecchymoses as well as abrasions, full range of motion of all her extremities are intact without focal weakness or numbness, she is equal radial and DP pulses bilaterally.  Dry mucous membranes.   ED Results / Procedures / Treatments   Labs (all labs ordered are listed, but only abnormal results are displayed) Labs Reviewed  COMPREHENSIVE METABOLIC PANEL WITH GFR - Abnormal; Notable for the following components:      Result Value   Sodium 129 (*)    Chloride 96 (*)    CO2 21 (*)    Glucose, Bld 132 (*)    BUN 35 (*)    Creatinine, Ser 1.46 (*)    GFR, Estimated 35 (*)  All other components within normal limits  CBC  BRAIN NATRIURETIC PEPTIDE  MAGNESIUM  TSH  T4, FREE  CBG MONITORING, ED  TROPONIN I (HIGH SENSITIVITY)  TROPONIN I (HIGH SENSITIVITY)     EKG  EKG shows sinus rhythm with frequent PVCs, there is a lot of artifact on this EKG but no overt ST elevation, T wave flattening in aVL, bigeminy is noted on this EKG, it is a change compared to prior, will get a repeat EKG.  Repeat EKG shows atrial fibrillation, rate 102, normal QRS, normal QTc, no ischemic ST elevation, T wave flattening in 1, aVL, this is changed compared to prior.   RADIOLOGY X-ray of the forearm my independent interpretation without obvious fracture   PROCEDURES:  Critical Care performed: No  Procedures   MEDICATIONS ORDERED IN ED: Medications  sodium chloride  tablet 1 g (has no administration in time range)  dextromethorphan-guaiFENesin  (MUCINEX  DM) 30-600 MG per 12 hr tablet 1 tablet (has no administration in time range)  ondansetron  (ZOFRAN) injection 4 mg (has no administration in time range)  hydrALAZINE (APRESOLINE) injection 5 mg (has no administration in time range)  acetaminophen  (TYLENOL ) tablet 650 mg (has no administration in time range)  0.9 %  sodium chloride  infusion (has no administration in time range)  albuterol  (PROVENTIL ) (2.5 MG/3ML) 0.083% nebulizer solution 2.5 mg (has no administration in time range)  lactated ringers bolus 1,000 mL (1,000 mLs Intravenous New Bag/Given 10/13/23 2239)  acetaminophen  (TYLENOL ) tablet 1,000 mg (1,000 mg Oral Given 10/13/23 2216)     IMPRESSION / MDM / ASSESSMENT AND PLAN / ED COURSE  I reviewed the triage vital signs and the nursing notes.                              Differential diagnosis includes, but is not limited to, ACS, arrhythmia, dehydration, electrolyte derangements, for a fall, consider contusion, strain, abrasions, fracture.  Will get labs, EKG, troponin, x-rays of her hip and forearm.  Tylenol  for pain.    Patient's presentation is most consistent with acute presentation with potential threat to life or bodily function.  Independent review of labs and imaging below.  On reassessment patient is no lightheadedness, denies any prior history of bigeminy or A-fib.  Given the new atrial fibrillation that would intermittently go into bigeminy, I believe patient should be admitted for observation and cardiology evaluation tomorrow morning.  Shared decision making done with patient and husband and they are agreeable with plan for admission and telemetry.  Consulted hospitalist who was agreeable with the plan for admission and will evaluate the patient.  She is admitted.  The patient is on the cardiac monitor to evaluate for evidence of arrhythmia and/or significant heart rate changes.   Clinical Course as of 10/13/23 2328  Fri Oct 13, 2023  2202 Independent review of labs, troponins negative, no leukocytosis, she has a mild hyponatremia as well as an AKI, suspect  this may be related to dehydration, will give her some IV fluids. [TT]  2203 DG Forearm Left Mild degenerative changes.  No acute displaced fractures identified.  [TT]  2203 DG Hip Unilat With Pelvis 2-3 Views Left IMPRESSION: Degenerative changes in the left hip. No acute displaced fractures identified.   [TT]    Clinical Course User Index [TT] Drenda Gentle Richard Champion, MD     FINAL CLINICAL IMPRESSION(S) / ED DIAGNOSES   Final diagnoses:  Fall, initial encounter  Abrasion  Ecchymosis of forearm  Pain of left hip  New onset atrial fibrillation [facility])  Ventricular bigeminy seen on cardiac monitor     Rx / DC Orders   ED Discharge Orders     None        Note:  This document was prepared using Dragon voice recognition software and may include unintentional dictation errors.    Shane Darling, MD 10/13/23 [id number]

## 2023-10-13 NOTE — ED Notes (Addendum)
 Non adhesive gauze used to cover up abrasion to L forearm.

## 2023-10-14 DIAGNOSIS — M25552 Pain in left hip: Secondary | ICD-10-CM | POA: Diagnosis not present

## 2023-10-14 DIAGNOSIS — E785 Hyperlipidemia, unspecified: Secondary | ICD-10-CM | POA: Diagnosis present

## 2023-10-14 DIAGNOSIS — I5032 Chronic diastolic (congestive) heart failure: Secondary | ICD-10-CM | POA: Diagnosis present

## 2023-10-14 DIAGNOSIS — E663 Overweight: Secondary | ICD-10-CM | POA: Diagnosis present

## 2023-10-14 DIAGNOSIS — N179 Acute kidney failure, unspecified: Secondary | ICD-10-CM | POA: Diagnosis present

## 2023-10-14 DIAGNOSIS — R9431 Abnormal electrocardiogram [ECG] [EKG]: Secondary | ICD-10-CM | POA: Diagnosis present

## 2023-10-14 DIAGNOSIS — W19XXXA Unspecified fall, initial encounter: Secondary | ICD-10-CM

## 2023-10-14 LAB — CBC
HCT: 34.7 % — ABNORMAL LOW (ref 36.0–46.0)
Hemoglobin: 12 g/dL (ref 12.0–15.0)
MCH: 31.9 pg (ref 26.0–34.0)
MCHC: 34.6 g/dL (ref 30.0–36.0)
MCV: 92.3 fL (ref 80.0–100.0)
Platelets: 207 10*3/uL (ref 150–400)
RBC: 3.76 MIL/uL — ABNORMAL LOW (ref 3.87–5.11)
RDW: 12.3 % (ref 11.5–15.5)
WBC: 9.4 10*3/uL (ref 4.0–10.5)
nRBC: 0 % (ref 0.0–0.2)

## 2023-10-14 LAB — BASIC METABOLIC PANEL WITH GFR
Anion gap: 8 (ref 5–15)
BUN: 29 mg/dL — ABNORMAL HIGH (ref 8–23)
CO2: 23 mmol/L (ref 22–32)
Calcium: 8.4 mg/dL — ABNORMAL LOW (ref 8.9–10.3)
Chloride: 101 mmol/L (ref 98–111)
Creatinine, Ser: 1.16 mg/dL — ABNORMAL HIGH (ref 0.44–1.00)
GFR, Estimated: 47 mL/min — ABNORMAL LOW (ref >60)
Glucose, Bld: 110 mg/dL — ABNORMAL HIGH (ref 70–99)
Potassium: 3.9 mmol/L (ref 3.5–5.1)
Sodium: 132 mmol/L — ABNORMAL LOW (ref 135–145)

## 2023-10-14 LAB — MAGNESIUM: Magnesium: 1.9 mg/dL (ref 1.7–2.4)

## 2023-10-14 LAB — BRAIN NATRIURETIC PEPTIDE: B Natriuretic Peptide: 105 pg/mL — ABNORMAL HIGH (ref 0.0–100.0)

## 2023-10-14 LAB — T4, FREE: Free T4: 0.85 ng/dL (ref 0.61–1.12)

## 2023-10-14 LAB — TSH: TSH: 4.825 u[IU]/mL — ABNORMAL HIGH (ref 0.350–4.500)

## 2023-10-14 MED ORDER — CRANBERRY 250 MG PO TABS: ORAL_TABLET | Freq: Every day | ORAL | Status: DC

## 2023-10-14 MED ORDER — FLUTICASONE FUROATE-VILANTEROL 200-25 MCG/ACT IN AEPB
1.0000 | INHALATION_SPRAY | Freq: Every day | RESPIRATORY_TRACT | Status: DC
  Filled 2023-10-14 (×2): qty 28

## 2023-10-14 MED ORDER — BIOTIN 10 MG PO TABS: 1.0000 | ORAL_TABLET | Freq: Every day | ORAL | Status: DC

## 2023-10-14 MED ORDER — ENOXAPARIN SODIUM 40 MG/0.4ML IJ SOSY
40.0000 mg | PREFILLED_SYRINGE | INTRAMUSCULAR | Status: DC
  Administered 2023-10-14: 40 mg via SUBCUTANEOUS
  Filled 2023-10-14: qty 0.4

## 2023-10-14 MED ORDER — FAMCICLOVIR 500 MG PO TABS
250.0000 mg | ORAL_TABLET | Freq: Every day | ORAL | Status: DC
  Administered 2023-10-14: 250 mg via ORAL
  Filled 2023-10-14: qty 0.5

## 2023-10-14 MED ORDER — VITAMIN E 45 MG (100 UNIT) PO CAPS
400.0000 [IU] | ORAL_CAPSULE | Freq: Every day | ORAL | Status: DC
  Administered 2023-10-14: 400 [IU] via ORAL
  Filled 2023-10-14: qty 4

## 2023-10-14 MED ORDER — METHOCARBAMOL 500 MG PO TABS: 500.0000 mg | ORAL_TABLET | Freq: Three times a day (TID) | ORAL | Status: DC | PRN

## 2023-10-14 MED ORDER — VITAMIN D 25 MCG (1000 UNIT) PO TABS
2000.0000 [IU] | ORAL_TABLET | Freq: Every day | ORAL | Status: DC
  Administered 2023-10-14: 2000 [IU] via ORAL
  Filled 2023-10-14: qty 2

## 2023-10-14 MED ORDER — ROSUVASTATIN CALCIUM 10 MG PO TABS
10.0000 mg | ORAL_TABLET | Freq: Every day | ORAL | Status: DC
  Administered 2023-10-14: 10 mg via ORAL
  Filled 2023-10-14: qty 1

## 2023-10-14 MED ORDER — CYANOCOBALAMIN 500 MCG PO TABS
1000.0000 ug | ORAL_TABLET | Freq: Every day | ORAL | Status: DC
  Administered 2023-10-14: 1000 ug via ORAL
  Filled 2023-10-14: qty 2

## 2023-10-14 MED ORDER — HYDRALAZINE HCL 20 MG/ML IJ SOLN
10.0000 mg | INTRAMUSCULAR | Status: DC | PRN
Start: 2023-10-14 — End: 2023-10-14

## 2023-10-14 MED ORDER — METOPROLOL SUCCINATE ER 25 MG PO TB24
12.5000 mg | ORAL_TABLET | Freq: Every day | ORAL | Status: DC
  Administered 2023-10-14: 12.5 mg via ORAL
  Filled 2023-10-14: qty 1

## 2023-10-14 NOTE — Progress Notes (Signed)
 OT Cancellation Note  Patient Details Name: Patty Shelton MRN: [id number] DOB: 14-Jan-1941   Cancelled Treatment:    Reason Eval/Treat Not Completed: OT screened, no needs identified, will sign off. Order received, chart reviewed. Per chart pt has been ambulating in room without assist with steady gait. PT performed evaluation and states no OT needs at this time. OT to sign off in house with no further needs.  Analleli Gierke E Anaise Sterbenz 10/14/2023, 11:53 AM

## 2023-10-14 NOTE — ED Notes (Signed)
 Pt ambulated to toilet in room w/o staff assistance. Pt had steady, even gait.

## 2023-10-14 NOTE — ED Notes (Signed)
Pt ambulated to in room toilet without assistance from staff. Pt has a steady, even gait.

## 2023-10-14 NOTE — Evaluation (Signed)
 Physical Therapy Evaluation Patient Details Name: Patty Shelton MRN: [id number] DOB: Sep 03, 1940 Today's Date: 10/14/2023  History of Present Illness  Patty Shelton is a 83 y.o. female with medical history significant of hypertension, hyperlipidemia, asthma, interstitial lung disease, CAD, diastolic CHF, GERD, CKD stage IIIa, sciatica, who presents with fall.     Pt states that she accidentally fell when was watering her plants and tripped steps in the evening. She fell onto L side vs concrete. She injured her left forearm and the left hip.  She has bruise and mild skin tear to left forearm, and bruise to left hip.  No LOC.  She has mild dizziness. She strongly denies head or neck injury.  She refused CT scan of head and neck.  Patient does not have chest pain, cough, SOB.  No nausea, vomiting, diarrhea or abdominal pain.  Denies symptoms of UTI.  Clinical Impression  Pt is a pleasant 83 year old female who was admitted for hyponatremia and complaints of fall while in her garden. Pt performs bed mobility/transfers with independence and ambulation with supervision and no AD. Pt complains of slight soreness on L hip, however doesn't appear to limited mobility at this time. Pt does not require any further PT needs at this time. Pt will be dc in house and does not require follow up. RN aware. Will dc current orders.        If plan is discharge home, recommend the following: A little help with walking and/or transfers   Can travel by private vehicle        Equipment Recommendations None recommended by PT  Recommendations for Other Services       Functional Status Assessment Patient has not had a recent decline in their functional status     Precautions / Restrictions Precautions Precautions: Fall Restrictions Weight Bearing Restrictions Per Provider Order: No      Mobility  Bed Mobility Overal bed mobility: Independent             General bed mobility comments: safe technique     Transfers Overall transfer level: Independent Equipment used: None               General transfer comment: safe technique.    Ambulation/Gait Ambulation/Gait assistance: Supervision Gait Distance (Feet): 200 Feet Assistive device: None Gait Pattern/deviations: WFL(Within Functional Limits)       General Gait Details: ambulated safely in hallway. No AD required. 1 bout of unsteadiness  Stairs            Wheelchair Mobility     Tilt Bed    Modified Rankin (Stroke Patients Only)       Balance Overall balance assessment: No apparent balance deficits (not formally assessed)                                           Pertinent Vitals/Pain Pain Assessment Pain Assessment: Faces Faces Pain Scale: Hurts a little bit Pain Location: L hip Pain Descriptors / Indicators: Aching Pain Intervention(s): Limited activity within patient's tolerance, Repositioned    Home Living Family/patient expects to be discharged to:: Private residence Living Arrangements: Spouse/significant other Available Help at Discharge: Family;Available 24 hours/day Type of Home: House Home Access: Stairs to enter Entrance Stairs-Rails: None Entrance Stairs-Number of Steps: 1   Home Layout: Able to live on main level with bedroom/bathroom Home Equipment: None  Prior Function Prior Level of Function : Independent/Modified Independent;Driving             Mobility Comments: indep. reports 2 recent falls- no injury ADLs Comments: indep     Extremity/Trunk Assessment   Upper Extremity Assessment Upper Extremity Assessment: Overall WFL for tasks assessed    Lower Extremity Assessment Lower Extremity Assessment: Overall WFL for tasks assessed       Communication   Communication Communication: No apparent difficulties    Cognition Arousal: Alert Behavior During Therapy: WFL for tasks assessed/performed   PT - Cognitive impairments: No apparent  impairments                       PT - Cognition Comments: very pleasant and agreeable to session Following commands: Intact       Cueing Cueing Techniques: Verbal cues     General Comments      Exercises     Assessment/Plan    PT Assessment Patient does not need any further PT services  PT Problem List         PT Treatment Interventions      PT Goals (Current goals can be found in the Care Plan section)  Acute Rehab PT Goals Patient Stated Goal: to go home PT Goal Formulation: All assessment and education complete, DC therapy Time For Goal Achievement: 10/14/23 Potential to Achieve Goals: Good    Frequency       Co-evaluation               AM-PAC PT "6 Clicks" Mobility  Outcome Measure Help needed turning from your back to your side while in a flat bed without using bedrails?: None Help needed moving from lying on your back to sitting on the side of a flat bed without using bedrails?: None Help needed moving to and from a bed to a chair (including a wheelchair)?: None Help needed standing up from a chair using your arms (e.g., wheelchair or bedside chair)?: None Help needed to walk in hospital room?: None Help needed climbing 3-5 steps with a railing? : None 6 Click Score: 24    End of Session   Activity Tolerance: Patient tolerated treatment well Patient left: in bed Nurse Communication: Mobility status PT Visit Diagnosis: Difficulty in walking, not elsewhere classified (R26.2)    Time: 1610-9604 PT Time Calculation (min) (ACUTE ONLY): 8 min   Charges:   PT Evaluation $PT Eval Low Complexity: 1 Low   PT General Charges $$ ACUTE PT VISIT: 1 Visit         Amparo Balk, PT, DPT, GCS [phone]   Winn Muehl 10/14/2023, 12:51 PM

## 2023-10-16 ENCOUNTER — Ambulatory Visit (INDEPENDENT_AMBULATORY_CARE_PROVIDER_SITE_OTHER): Admitting: Dermatology

## 2023-10-16 ENCOUNTER — Encounter: Payer: Self-pay | Admitting: Dermatology

## 2023-10-16 DIAGNOSIS — W19XXXA Unspecified fall, initial encounter: Secondary | ICD-10-CM | POA: Diagnosis not present

## 2023-10-16 DIAGNOSIS — L299 Pruritus, unspecified: Secondary | ICD-10-CM | POA: Diagnosis not present

## 2023-10-16 DIAGNOSIS — S50312A Abrasion of left elbow, initial encounter: Secondary | ICD-10-CM

## 2023-10-16 DIAGNOSIS — R21 Rash and other nonspecific skin eruption: Secondary | ICD-10-CM | POA: Diagnosis not present

## 2023-10-16 DIAGNOSIS — T148XXA Other injury of unspecified body region, initial encounter: Secondary | ICD-10-CM

## 2023-10-16 NOTE — Patient Instructions (Addendum)
 Wound Care Instructions  Cleanse wound gently with soap and water once a day then pat dry with clean gauze. Apply a thin coat of Petrolatum (petroleum jelly, "Vaseline") over the wound (unless you have an allergy to this). We recommend that you use a new, sterile tube of Vaseline. Do not pick or remove scabs. Do not remove the yellow or white "healing tissue" from the base of the wound.  Cover the wound with fresh, clean, nonstick gauze and secure with paper tape. You may use Band-Aids in place of gauze and tape if the wound is small enough, but would recommend trimming much of the tape off as there is often too much. Sometimes Band-Aids can irritate the skin.  If you experience any problems, such as abnormal amounts of bleeding, swelling, significant bruising, significant pain, or evidence of infection, please call the office immediately.      Gentle Skin Care Guide  1. Bathe no more than once a day.  2. Avoid bathing in hot water  3. Use a mild soap like Dove, Vanicream, Cetaphil, CeraVe. Can use Lever 2000 or Cetaphil antibacterial soap  4. Use soap only where you need it. On most days, use it under your arms, between your legs, and on your feet. Let the water rinse other areas unless visibly dirty.  5. When you get out of the bath/shower, use a towel to gently blot your skin dry, don't rub it.  6. While your skin is still a little damp, apply a moisturizing cream such as Vanicream, CeraVe, Cetaphil, Eucerin, Sarna lotion or plain Vaseline Jelly. For hands apply Neutrogena [place] Hand Cream or Excipial Hand Cream.  7. Reapply moisturizer any time you start to itch or feel dry.  8. Sometimes using free and clear laundry detergents can be helpful. Fabric softener sheets should be avoided. Downy Free & Gentle liquid, or any liquid fabric softener that is free of dyes and perfumes, it acceptable to use  9. If your doctor has given you prescription creams you may apply moisturizers  over them   Due to recent changes in healthcare laws, you may see results of your pathology and/or laboratory studies on MyChart before the doctors have had a chance to review them. We understand that in some cases there may be results that are confusing or concerning to you. Please understand that not all results are received at the same time and often the doctors may need to interpret multiple results in order to provide you with the best plan of care or course of treatment. Therefore, we ask that you please give us  2 business days to thoroughly review all your results before contacting the office for clarification. Should we see a critical lab result, you will be contacted sooner.   If You Need Anything After Your Visit  If you have any questions or concerns for your doctor, please call our main line at [phone] and press option 4 to reach your doctor's medical assistant. If no one answers, please leave a voicemail as directed and we will return your call as soon as possible. Messages left after 4 pm will be answered the following business day.   You may also send us  a message via MyChart. We typically respond to MyChart messages within 1-2 business days.  For prescription refills, please ask your pharmacy to contact our office. Our fax number is [phone].  If you have an urgent issue when the clinic is closed that cannot wait until the next business day, you  can page your doctor at the number below.    Please note that while we do our best to be available for urgent issues outside of office hours, we are not available 24/7.   If you have an urgent issue and are unable to reach us , you may choose to seek medical care at your doctor's office, retail clinic, urgent care center, or emergency room.  If you have a medical emergency, please immediately call 911 or go to the emergency department.  Pager Numbers  - Dr. Bary Likes: [phone]  - Dr. Annette Barters: [phone]  - Dr. Felipe Horton:  [phone]   In the event of inclement weather, please call our main line at [phone] for an update on the status of any delays or closures.  Dermatology Medication Tips: Please keep the boxes that topical medications come in in order to help keep track of the instructions about where and how to use these. Pharmacies typically print the medication instructions only on the boxes and not directly on the medication tubes.   If your medication is too expensive, please contact our office at [phone] option 4 or send us  a message through MyChart.   We are unable to tell what your co-pay for medications will be in advance as this is different depending on your insurance coverage. However, we may be able to find a substitute medication at lower cost or fill out paperwork to get insurance to cover a needed medication.   If a prior authorization is required to get your medication covered by your insurance company, please allow us  1-2 business days to complete this process.  Drug prices often vary depending on where the prescription is filled and some pharmacies may offer cheaper prices.  The website www.goodrx.com contains coupons for medications through different pharmacies. The prices here do not account for what the cost may be with help from insurance (it may be cheaper with your insurance), but the website can give you the price if you did not use any insurance.  - You can print the associated coupon and take it with your prescription to the pharmacy.  - You may also stop by our office during regular business hours and pick up a GoodRx coupon card.  - If you need your prescription sent electronically to a different pharmacy, notify our office through [facility] or by phone at [phone] option 4.     Si Usted Necesita Algo Despus de Su Visita  Tambin puede enviarnos un mensaje a travs de Clinical cytogeneticist. Por lo general respondemos a los mensajes de MyChart en el transcurso de 1 a  2 das hbiles.  Para renovar recetas, por favor pida a su farmacia que se ponga en contacto con nuestra oficina. Franz Jacks de fax es [place] [phone].  Si tiene un asunto urgente cuando la clnica est cerrada y que no puede esperar hasta el siguiente da hbil, puede llamar/localizar a su doctor(a) al nmero que aparece a continuacin.   Por favor, tenga en cuenta que aunque hacemos todo lo posible para estar disponibles para asuntos urgentes fuera del horario de [place], no estamos disponibles las 24 horas del da, los 7 [address]  [address] de la [place].   Si tiene un problema urgente y no puede comunicarse con nosotros, puede optar por buscar atencin mdica  en el consultorio de su doctor(a), en una clnica privada, en un centro de atencin urgente o en una sala de emergencias.  Si tiene una emergencia mdica, por favor llame inmediatamente al 911 o vaya a  la sala de emergencias.  Nmeros de bper  - Dr. Bary Likes: [phone]  - Dra. Annette Barters: [phone]  - Dr. Felipe Horton: [phone]   En caso de inclemencias del tiempo, por favor llame a Lajuan Pila principal al [phone] para una actualizacin sobre el [place] de cualquier retraso o cierre.  Consejos para la medicacin en dermatologa: Por favor, guarde las cajas en las que vienen los medicamentos de uso tpico para ayudarle a seguir las instrucciones sobre dnde y cmo usarlos. Las farmacias generalmente imprimen las instrucciones del medicamento slo en las cajas y no directamente en los tubos del [place].   Si su medicamento es muy caro, por favor, pngase en contacto con Bettyjane Brunet llamando al [phone] y presione la opcin 4 o envenos un mensaje a travs de Clinical cytogeneticist.   No podemos decirle cul ser su copago por los medicamentos por adelantado ya que esto es diferente dependiendo de la cobertura de su seguro. Sin embargo, es posible que podamos encontrar un medicamento sustituto a Audiological scientist un formulario para que  el seguro cubra el medicamento que se considera necesario.   Si se requiere una autorizacin previa para que su compaa de seguros [place] su medicamento, por favor permtanos de 1 a 2 das hbiles para completar este proceso.  Los precios de los medicamentos varan con frecuencia dependiendo del Environmental consultant de dnde se surte la receta y alguna farmacias pueden ofrecer precios ms baratos.  El sitio web www.goodrx.com tiene cupones para medicamentos de Health and safety inspector. Los precios aqu no tienen en cuenta lo que podra costar con la ayuda del seguro (puede ser ms barato con su seguro), pero el sitio web puede darle el precio si no utiliz Tourist information centre manager.  - Puede imprimir el cupn correspondiente y llevarlo con su receta a la farmacia.  - Tambin puede pasar por nuestra oficina durante el horario de atencin regular y Education officer, museum una tarjeta de cupones de GoodRx.  - Si necesita que su receta se enve electrnicamente a una farmacia diferente, informe a nuestra oficina a travs de MyChart de [place] o por telfono llamando al [phone] y presione la opcin 4.

## 2023-10-16 NOTE — Progress Notes (Signed)
   Follow-Up Visit   Subjective  Patty Shelton is a 83 y.o. female who presents for the following: itching at arms that started Thursday. Patient went to drugstore and got some over the counter hydrocortisone and within a few hours the itching improved. Not itching currently.   Patient did fall on Friday and went to ED. She was there for about 14 hours but then released.  The patient has spots, moles and lesions to be evaluated, some may be new or changing and the patient may have concern these could be cancer.   The following portions of the chart were reviewed this encounter and updated as appropriate: medications, allergies, medical history  Review of Systems:  No other skin or systemic complaints except as noted in HPI or Assessment and Plan.  Objective  Well appearing patient in no apparent distress; mood and affect are within normal limits.   A focused examination was performed of the following areas: arms  Relevant exam findings are noted in the Assessment and Plan.    Assessment & Plan   Rash Exam: no rash on forearms. No focal lesions that would explain pruritus  Plan: Thick moisturizer daily prn  WOUND/abrasion Exam: erosion with fibrin on L extensor elbow/forearm  Treatment Plan: S/p fall. Patient has been seen in ED. Dressing removed, area cleaned with Puracyn, applied mupirocin  and covered with a bandage. Recommend Vaseline or Aquaphor with dressing daily ABRASION   RASH    Return for TBSE, with Dr. Annette Barters, as scheduled.  [name], RMA, am acting as scribe for Harris Liming, MD .   Documentation: I have reviewed the above documentation for accuracy and completeness, and I agree with the above.  Harris Liming, MD

## 2023-10-17 ENCOUNTER — Encounter: Payer: Self-pay | Admitting: Family Medicine

## 2023-10-17 ENCOUNTER — Ambulatory Visit: Admitting: Medical

## 2023-10-17 ENCOUNTER — Ambulatory Visit (INDEPENDENT_AMBULATORY_CARE_PROVIDER_SITE_OTHER): Admitting: Family Medicine

## 2023-10-17 VITALS — BP 160/70 | HR 75 | Temp 98.2°F | Ht 66.5 in | Wt 175.1 lb

## 2023-10-17 DIAGNOSIS — I480 Paroxysmal atrial fibrillation: Secondary | ICD-10-CM | POA: Diagnosis not present

## 2023-10-17 DIAGNOSIS — N179 Acute kidney failure, unspecified: Secondary | ICD-10-CM

## 2023-10-17 DIAGNOSIS — I10 Essential (primary) hypertension: Secondary | ICD-10-CM | POA: Diagnosis not present

## 2023-10-17 DIAGNOSIS — E871 Hypo-osmolality and hyponatremia: Secondary | ICD-10-CM

## 2023-10-17 LAB — BASIC METABOLIC PANEL WITH GFR
BUN: 20 mg/dL (ref 6–23)
CO2: 29 meq/L (ref 19–32)
Calcium: 9.1 mg/dL (ref 8.4–10.5)
Chloride: 100 meq/L (ref 96–112)
Creatinine, Ser: 0.98 mg/dL (ref 0.40–1.20)
GFR: 53.42 mL/min — ABNORMAL LOW (ref >60.00)
Glucose, Bld: 99 mg/dL (ref 70–99)
Potassium: 4.6 meq/L (ref 3.5–5.1)
Sodium: 135 meq/L (ref 135–145)

## 2023-10-17 NOTE — Progress Notes (Signed)
 Patient ID: Patty Shelton, female    DOB: [birth year], 83 y.o.   MRN: [id number]  This visit was conducted in person.  BP (!) 160/70   Pulse 75   Temp 98.2 F (36.8 C) (Temporal)   Ht 5' 6.5" (1.689 m)   Wt 175 lb 2 oz (79.4 kg)   SpO2 96%   BMI 27.84 kg/m    CC:  Chief Complaint  Patient presents with   Follow-up    ER Follow up Fall  A-Fib    Subjective:   HPI: Patty Shelton is a 83 y.o. female presenting on 10/17/2023 for Follow-up (ER Follow up/Fall /A-Fib)  Recent hospital admission on May 2 for fall and resulting left hip pain. She went out to water plants stood up felt lightheaded and fell to her left side.  She did not hit her head or pass out.  EKG showed new diagnosis atrial fibrillation heart rate 102, occ going into bigeminy  X-ray of the forearm  and left hip showed no fracture,  CMET showed low sodium, Cr 1.46  Given IVF  Mg, BNP and free t4 nml, TSH slightly high  No discharge summary for review yet.  Meds not changed.  She is on metoprolol  XL 25 mg daily.... taking whole tablet. Stop hydrochlorothiazide .... was hyponatremia   Mild SOB, no dizziness. She feels back to normal.   Does not yet  have appt with cardiology.. plans to.  Dr. Addie Holstein.  Per husband when she was discharge she was no longer in afib.     Relevant past medical, surgical, family and social history reviewed and updated as indicated. Interim medical history since our last visit reviewed. Allergies and medications reviewed and updated. Outpatient Medications Prior to Visit  Medication Sig Dispense Refill   acetaminophen  (TYLENOL ) 500 MG tablet Take 500 mg by mouth every 6 (six) hours as needed.     albuterol  (VENTOLIN  HFA) 108 (90 Base) MCG/ACT inhaler Inhale 2 puffs into the lungs every 4 (four) hours as needed for wheezing or shortness of breath. 8 g 10   Biotin  10 MG TABS Take 1 tablet by mouth daily.     cetirizine (ZYRTEC) 10 MG tablet Take 10 mg by mouth daily as needed for  allergies.     Cholecalciferol  (VITAMIN D3) 50 MCG (2000 UT) TABS Take 1 tablet by mouth daily.     CRANBERRY PO Take 1 capsule by mouth daily.     diclofenac  sodium (VOLTAREN ) 1 % GEL Apply 4 g topically 4 (four) times daily as needed.     famciclovir  (FAMVIR ) 250 MG tablet TAKE 1 TABLET DAILY 90 tablet 3   fluticasone  (FLONASE ) 50 MCG/ACT nasal spray Place 2 sprays into both nostrils as needed.      fluticasone -salmeterol (ADVAIR  HFA) 115-21 MCG/ACT inhaler USE 2 INHALATIONS TWICE A DAY 3 each 2   losartan  (COZAAR ) 100 MG tablet TAKE 1 TABLET DAILY 90 tablet 0   meloxicam  (MOBIC ) 15 MG tablet Take 15 mg by mouth daily.     methocarbamol  (ROBAXIN ) 500 MG tablet Take 500 mg by mouth 3 (three) times daily.     metoprolol  succinate (TOPROL  XL) 25 MG 24 hr tablet Take 0.5 tablets (12.5 mg total) by mouth daily. 90 tablet 3   Multiple Minerals-Vitamins (CAL-MAG-ZINC-D PO) Take 1 tablet by mouth daily.     Respiratory Therapy Supplies (FLUTTER) DEVI 1 Device by Does not apply route as directed. 1 each 0   rosuvastatin  (CRESTOR ) 10  MG tablet TAKE 1 TABLET DAILY 90 tablet 3   vitamin B-12 (CYANOCOBALAMIN ) 1000 MCG tablet Take 1,000 mcg by mouth daily.     vitamin E  180 MG (400 UNITS) capsule Take 400 Units by mouth daily.     No facility-administered medications prior to visit.     Per HPI unless specifically indicated in ROS section below Review of Systems  Constitutional:  Negative for fatigue and fever.  HENT:  Negative for congestion.   Eyes:  Negative for pain.  Respiratory:  Negative for cough and shortness of breath.   Cardiovascular:  Negative for chest pain, palpitations and leg swelling.  Gastrointestinal:  Negative for abdominal pain.  Genitourinary:  Negative for dysuria and vaginal bleeding.  Musculoskeletal:  Negative for back pain.  Neurological:  Negative for syncope, light-headedness and headaches.  Psychiatric/Behavioral:  Negative for dysphoric mood.    Objective:  BP  (!) 160/70   Pulse 75   Temp 98.2 F (36.8 C) (Temporal)   Ht 5' 6.5" (1.689 m)   Wt 175 lb 2 oz (79.4 kg)   SpO2 96%   BMI 27.84 kg/m   Wt Readings from Last 3 Encounters:  10/20/23 169 lb (76.7 kg)  10/17/23 175 lb 2 oz (79.4 kg)  10/13/23 178 lb (80.7 kg)      Physical Exam Constitutional:      General: She is not in acute distress.    Appearance: Normal appearance. She is well-developed. She is not ill-appearing or toxic-appearing.  HENT:     Head: Normocephalic.     Right Ear: Hearing, tympanic membrane, ear canal and external ear normal. Tympanic membrane is not erythematous, retracted or bulging.     Left Ear: Hearing, tympanic membrane, ear canal and external ear normal. Tympanic membrane is not erythematous, retracted or bulging.     Nose: No mucosal edema or rhinorrhea.     Right Sinus: No maxillary sinus tenderness or frontal sinus tenderness.     Left Sinus: No maxillary sinus tenderness or frontal sinus tenderness.     Mouth/Throat:     Pharynx: Uvula midline.  Eyes:     General: Lids are normal. Lids are everted, no foreign bodies appreciated.     Conjunctiva/sclera: Conjunctivae normal.     Pupils: Pupils are equal, round, and reactive to light.  Neck:     Thyroid : No thyroid  mass or thyromegaly.     Vascular: No carotid bruit.     Trachea: Trachea normal.  Cardiovascular:     Rate and Rhythm: Normal rate and regular rhythm.     Pulses: Normal pulses.     Heart sounds: Normal heart sounds, S1 normal and S2 normal. No murmur heard.    No friction rub. No gallop.  Pulmonary:     Effort: Pulmonary effort is normal. No tachypnea or respiratory distress.     Breath sounds: Normal breath sounds. No decreased breath sounds, wheezing, rhonchi or rales.  Abdominal:     General: Bowel sounds are normal.     Palpations: Abdomen is soft.     Tenderness: There is no abdominal tenderness.  Musculoskeletal:     Cervical back: Normal range of motion and neck supple.   Skin:    General: Skin is warm and dry.     Findings: No rash.  Neurological:     Mental Status: She is alert.  Psychiatric:        Mood and Affect: Mood is not anxious or depressed.  Speech: Speech normal.        Behavior: Behavior normal. Behavior is cooperative.        Thought Content: Thought content normal.        Judgment: Judgment normal.       Results for orders placed or performed in visit on 10/17/23  Basic Metabolic Panel   Collection Time: 10/17/23 12:43 PM  Result Value Ref Range   Sodium 135 135 - 145 mEq/L   Potassium 4.6 3.5 - 5.1 mEq/L   Chloride 100 96 - 112 mEq/L   CO2 29 19 - 32 mEq/L   Glucose, Bld 99 70 - 99 mg/dL   BUN 20 6 - 23 mg/dL   Creatinine, Ser 1.61 0.40 - 1.20 mg/dL   GFR 09.60 (L) >45.40 mL/min   Calcium  9.1 8.4 - 10.5 mg/dL    Assessment and Plan  Paroxysmal atrial fibrillation (HCC) Assessment & Plan:  New diagnosis, acute  Patient rate controlled with metoprolol  but blood pressure remaining high. She plans to call to get appointment with cardiology Dr. Addie Holstein. No anticoagulate has been started at this point but she is CHA2DS2-VASc 6.    Details    Points Metrics  1 Has Congestive Heart Failure:  Yes    Current as of 2 hours ago  1 Has Vascular Disease:  Yes    Current as of 2 hours ago  1 Has Hypertension:  Yes    Current as of 2 hours ago  2 Age:  44    Current as of 2 hours ago  0 Has Diabetes Excluding Gestational Diabetes:  No    Current as of 2 hours ago  0 Had Stroke:  No  Had TIA:  No  Had Thromboembolism:  No    Current as of 2 hours ago  1 Female:  Yes    Current as of 2 hours ago             Hyponatremia Assessment & Plan: Now off HCTZ, due for reevaluation.  Orders: -     Basic metabolic panel with GFR  Acute renal failure, unspecified acute renal failure type [facility]) Assessment & Plan: Due for reevaluation   Primary hypertension Assessment & Plan: Chronic, blood pressure above goal in  office today.  She will verify whether she is taking half a tablet or full tablet of metoprolol .  She is off HCTZ currently given hyponatremia.    Addendum patient is seeing cardiology May 9. No follow-ups on file.   Herby Lolling, MD

## 2023-10-17 NOTE — Patient Instructions (Signed)
 Check BP at home at least every other day. Call > 160/100.  Also verify if you are taking 1 full tab or a half tablet of metoprolol .  Please stop at the lab to have labs drawn. Call Dr. Jill Motes office to get an ASAP visit for new onset atrial fibrillation.

## 2023-10-20 ENCOUNTER — Ambulatory Visit: Attending: Medical | Admitting: Medical

## 2023-10-20 ENCOUNTER — Ambulatory Visit

## 2023-10-20 ENCOUNTER — Encounter: Payer: Self-pay | Admitting: Medical

## 2023-10-20 VITALS — BP 170/84 | HR 70 | Ht 66.5 in | Wt 169.0 lb

## 2023-10-20 DIAGNOSIS — R002 Palpitations: Secondary | ICD-10-CM | POA: Diagnosis not present

## 2023-10-20 DIAGNOSIS — R0602 Shortness of breath: Secondary | ICD-10-CM | POA: Diagnosis not present

## 2023-10-20 DIAGNOSIS — R079 Chest pain, unspecified: Secondary | ICD-10-CM

## 2023-10-20 DIAGNOSIS — I480 Paroxysmal atrial fibrillation: Secondary | ICD-10-CM | POA: Insufficient documentation

## 2023-10-20 MED ORDER — APIXABAN 5 MG PO TABS: 5.0000 mg | ORAL_TABLET | Freq: Two times a day (BID) | ORAL | 3 refills | Status: DC

## 2023-10-20 MED ORDER — METOPROLOL SUCCINATE ER 25 MG PO TB24: 37.5000 mg | ORAL_TABLET | Freq: Every day | ORAL | 3 refills | Status: DC

## 2023-10-20 NOTE — Patient Instructions (Addendum)
 Medication Instructions:  Your physician has recommended you make the following change in your medication:   INCREASE Toprol  to 37.5 mg once daily  START Eliquis 5 mg twice a day  *If you need a refill on your cardiac medications before your next appointment, please call your pharmacy*  Lab Work: None  If you have labs (blood work) drawn today and your tests are completely normal, you will receive your results only by: MyChart Message (if you have MyChart) OR A paper copy in the mail If you have any lab test that is abnormal or we need to change your treatment, we will call you to review the results.  Testing/Procedures:  Your physician has recommended that you wear a Zio monitor.   This monitor is a medical device that records the heart's electrical activity. Doctors most often use these monitors to diagnose arrhythmias. Arrhythmias are problems with the speed or rhythm of the heartbeat. The monitor is a small device applied to your chest. You can wear one while you do your normal daily activities. While wearing this monitor if you have any symptoms to push the button and record what you felt. Once you have worn this monitor for the period of time provider prescribed (Usually 14 days), you will return the monitor device in the postage paid box. Once it is returned they will download the data collected and provide us  with a report which the provider will then review and we will call you with those results. Important tips:  Avoid showering during the first 24 hours of wearing the monitor. Avoid excessive sweating to help maximize wear time. Do not submerge the device, no hot tubs, and no swimming pools. Keep any lotions or oils away from the patch. After 24 hours you may shower with the patch on. Take brief showers with your back facing the shower head.  Do not remove patch once it has been placed because that will interrupt data and decrease adhesive wear time. Push the button when you  have any symptoms and write down what you were feeling. Once you have completed wearing your monitor, remove and place into box which has postage paid and place in your outgoing mailbox.  If for some reason you have misplaced your box then call our office and we can provide another box and/or mail it off for you.    Your physician has requested that you have an echocardiogram. Echocardiography is a painless test that uses sound waves to create images of your heart. It provides your doctor with information about the size and shape of your heart and how well your heart's chambers and valves are working. This procedure takes approximately one hour. There are no restrictions for this procedure. Please do NOT wear cologne, perfume, aftershave, or lotions (deodorant is allowed). Please arrive 15 minutes prior to your appointment time.  Please note: We ask at that you not bring children with you during ultrasound (echo/ vascular) testing. Due to room size and safety concerns, children are not allowed in the ultrasound rooms during exams. Our front office staff cannot provide observation of children in our lobby area while testing is being conducted. An adult accompanying a patient to their appointment will only be allowed in the ultrasound room at the discretion of the ultrasound technician under special circumstances. We apologize for any inconvenience.     Please report to Radiology at the [facility] Main Entrance 30 minutes early for your test.  [address] [place],  [place] [address]                         OR   Please report to Radiology at [facility] Main Entrance, medical mall, 30 mins prior to your test.  [address]  [place], [place]  How to Prepare for Your Cardiac PET/CT Stress Test:  Nothing to eat or drink, except water, 3 hours prior to arrival time.  NO caffeine/decaffeinated products, or chocolate 12 hours prior to arrival. (Please note  decaffeinated beverages (teas/coffees) still contain caffeine).  If you have caffeine within 12 hours prior, the test will need to be rescheduled.  Medication instructions: Do not take erectile dysfunction medications for 72 hours prior to test (sildenafil, tadalafil) Do not take nitrates (isosorbide mononitrate, Ranexa) the day before or day of test Do not take tamsulosin the day before or morning of test Hold theophylline containing medications for 12 hours. Hold Dipyridamole 48 hours prior to the test.  You may take your morning medications with small sip of water.   NO perfume, cologne or lotion on chest or abdomen area. FEMALES - Please avoid wearing dresses to this appointment.  Total time is 1 to 2 hours; you may want to bring reading material for the waiting time.  IF YOU THINK YOU MAY BE PREGNANT, OR ARE NURSING PLEASE INFORM THE TECHNOLOGIST.  In preparation for your appointment, medication and supplies will be purchased.  Appointment availability is limited, so if you need to cancel or reschedule, please call the Radiology Department Scheduler at [phone] 24 hours in advance to avoid a cancellation fee of $100.00  What to Expect When you Arrive:  Once you arrive and check in for your appointment, you will be taken to a preparation room within the Radiology Department.  A technologist or Nurse will obtain your medical history, verify that you are correctly prepped for the exam, and explain the procedure.  Afterwards, an IV will be started in your arm and electrodes will be placed on your skin for EKG monitoring during the stress portion of the exam. Then you will be escorted to the PET/CT scanner.  There, staff will get you positioned on the scanner and obtain a blood pressure and EKG.  During the exam, you will continue to be connected to the EKG and blood pressure machines.  A small, safe amount of a radioactive tracer will be injected in your IV to obtain a series of pictures  of your heart along with an injection of a stress agent.    After your Exam:  It is recommended that you eat a meal and drink a caffeinated beverage to counter act any effects of the stress agent.  Drink plenty of fluids for the remainder of the day and urinate frequently for the first couple of hours after the exam.  Your doctor will inform you of your test results within 7-10 business days.  For more information and frequently asked questions, please visit our website: [email or web address]  For questions about your test or how to prepare for your test, please call: Cardiac Imaging Nurse Navigators Office: [phone]  Follow-Up: At [facility], you and your health needs are our priority.  As part of our continuing mission to provide you with exceptional heart care, our providers are all part of one team.  This team includes your primary Cardiologist (physician) and Advanced Practice Providers or APPs (Physician Assistants and Nurse Practitioners) who all work together to  provide you with the care you need, when you need it.  Your next appointment:   2 month(s)  Provider:   Cadence Gennaro Khat, PA-C    We recommend signing up for the patient portal called "MyChart".  Sign up information is provided on this After Visit Summary.  MyChart is used to connect with patients for Virtual Visits (Telemedicine).  Patients are able to view lab/test results, encounter notes, upcoming appointments, etc.  Non-urgent messages can be sent to your provider as well.   To learn more about what you can do with MyChart, go to [email or web address].

## 2023-10-20 NOTE — Assessment & Plan Note (Signed)
 Due for reevaluation

## 2023-10-20 NOTE — Progress Notes (Signed)
 Cardiology Office Note:  .   Date:  10/20/2023  ID:  Patty Shelton, DOB 11-18-1940, MRN [id number] PCP: Judithann Novas, MD  [facility] Providers Cardiologist:  Randene Bustard, MD {  History of Present Illness: Patty Shelton   Patty Shelton is a 83 y.o. female with a past medical history of hypertension, hyperlipidemia, longstanding interstitial lung disease/bronchiectasis, asthma, dysphagia, GERD, hearing loss, osteopenia, CKD stage III who presents for ER follow-up.  Echocardiogram in December 2022 showed EF of 55 to 60%, no wall motion abnormality, grade 1 diastolic dysfunction, mild LA dilation, normal RV size and function.  Coronary CTA in November 2022 showed calcium  score of 20 with minimal proximal dominant RCA calcified plaque at 0 to 24% with no plaque noted in the LAD and nondominant left circumflex.    The patient presented to [facility] 08/01/2023 with chest pain.  She had previously been diagnosed with influenza A.  Troponin was negative x 2.  EKG showed normal sinus rhythm, no significant change.  Chest x-ray no focal consolidation.  She was seen in clinic 08/17/2023 reporting continual chest discomfort for several weeks.  She was started on Toprol  12.5 mg daily to help with palpitations.  Carotid duplex was ordered for carotid bruit.  She was seen back in clinic 09/28/2023 and was overall doing well.  The patient went to the ER 10/13/23 for dizziness and fall. She was watering her plants when she tripped on the steps.she had no LOC. She had no significant injuries. She refised CT scan of the head and neck. She was found to be in new onset Afib with rates up to 102bpm. Sodium was 129 and she was given chloride tablet. She was admitted but hospital did not feel it was Afib so no a/c was started. HS troponin normal. She was unable to get a bed so she was discharged home. While in afib she did not feel heart racing or palpitations. She was sent home on home dose of metoprolol .   Today, the patient feels  OK. EKG today shows NSR HR in the 70s.  She reports occasional chest heaviness when working. Also has been out of breath at times. Breathing worse with exertion. She denies lower leg edema. Lung smay be contributing to breathing issues. She denies heart racing or palpitations. Last night BP Was very high-190/110, 210/110. Most recent sodium 135  Studies Reviewed: Patty Shelton   EKG Interpretation Date/Time:  Friday Oct 20 2023 11:13:43 EDT Ventricular Rate:  67 PR Interval:  174 QRS Duration:  92 QT Interval:  412 QTC Calculation: 435 R Axis:   -26  Text Interpretation: Normal sinus rhythm Nonspecific ST abnormality When compared with ECG of 13-Oct-2023 22:47, PREVIOUS ECG IS PRESENT Confirmed by Gennaro Khat, Nyelli Samara ([id number]) on 10/20/2023 11:35:18 AM     Echo 05/2021 1. Left ventricular ejection fraction, by estimation, is 55 to 60%. The  left ventricle has normal function. The left ventricle has no regional  wall motion abnormalities. Left ventricular diastolic parameters are  consistent with Grade I diastolic  dysfunction (impaired relaxation).   2. Right ventricular systolic function is normal. The right ventricular  size is normal.   3. Left atrial size was mildly dilated.   4. The mitral valve is normal in structure. No evidence of mitral valve  regurgitation.   5. The aortic valve is tricuspid. Aortic valve regurgitation is not  visualized.   6. The inferior vena cava is normal in size with greater than 50%  respiratory variability,  suggesting right atrial pressure of 3 mmHg.   Cardiac CTA 05/2021   IMPRESSION: 1. Coronary calcium  score of 26.9. This was 28th percentile for age and sex matched control.   2. Normal coronary origin with right dominance.   3. Calcified plaque causing minimal proximal RCA disease.   4. CAD-RADS 1. Minimal non-obstructive CAD (0-24%). Consider non-atherosclerotic causes of chest pain. Consider preventive therapy and risk factor modification.    Electronically Signed: By: Constancia Delton M.D. On: 05/03/2021 15:43  Physical Exam:   VS:  BP (!) 170/84 (BP Location: Left Arm, Patient Position: Sitting, Cuff Size: Normal)   Pulse 70   Ht 5' 6.5" (1.689 m)   Wt 169 lb (76.7 kg)   SpO2 95%   BMI 26.87 kg/m    Wt Readings from Last 3 Encounters:  10/20/23 169 lb (76.7 kg)  10/17/23 175 lb 2 oz (79.4 kg)  10/13/23 178 lb (80.7 kg)    GEN: Well nourished, well developed in no acute distress NECK: No JVD; No carotid bruits CARDIAC: RRR, no murmurs, rubs, gallops RESPIRATORY:  Clear to auscultation without rales, wheezing or rhonchi  ABDOMEN: Soft, non-tender, non-distended EXTREMITIES:  No edema; No deformity   ASSESSMENT AND PLAN: .    Dizziness/fall New onset Afib Patient went into the ER for dizziness and a mechanical fall found to be in Afib with rates up to low 100s. She denied LOC, she refused head/neck CT. She was admitted but hospitalist was unsure was A-fib and she was not started on a blood thinner.  She was continued on her home dose of Toprol .  Sodium was low and and she was given salt tablets with improvement. THS was mildly abnormal. Patient was later discharged from the ER. EKGs from the ER reviewed with Dr. Gollan, confirm new onset A-fib.  EKG today shows NSR. She denies any further lightheadedness or dizziness. No further falls.  Orthostatics negative today.  CHADSVASC at least (agex2, PAD, female, HTN) at least 5.  I will start Eliquis 5 mg twice daily.  I will increase Toprol  to 37.5 mg daily.  I will get a 2 week heart monitor to assess A-fib burden. Labs in the ER otherwise good.   HTN BP has been high at home. BP today is also high. She is taking losartan  100mg  daily and Toprol  12.5mg  daily. Hydrochlorothiazide  was stopped for low sodium. I will increase Toprol  to 37.5 mg daily.  She will continue to monitor blood pressure at home and we will revisit at follow-up.  SOB Patient reports DOE for the last few  months. She has lung disease, which may be contributing. Cardiac CTA in 2022 showed minimal nonobstructive CAD. She is euvolemic on exam. Repeat echo and recommend follow-up with pulmonology.  Chest pressure Nonobstructive CAD Cardiac CT showed minimal nonobstructive CAD. May be from elevated BP versus A-fib.  Patient's husband would like a cardiac CTA, so we will order this. Continue Lipitor 10mg  daily. No ASA given Eliquis.   Carotid artery disease Duplex showed 1-39% stenosis on the left. Continue Crestor  10mg  daily.   HLD LDL 87. Continue Crestor  10 mg daily.    Informed Consent   Shared Decision Making/Informed Consent The risks [chest pain, shortness of breath, cardiac arrhythmias, dizziness, blood pressure fluctuations, myocardial infarction, stroke/transient ischemic attack, nausea, vomiting, allergic reaction, radiation exposure, metallic taste sensation and life-threatening complications (estimated to be 1 in 10,000)], benefits (risk stratification, diagnosing coronary artery disease, treatment guidance) and alternatives of a cardiac PET stress  test were discussed in detail with Ms. Salvati and she agrees to proceed.     Dispo: Follow-up in 2 months  Signed, Dakotah Orrego Rebekah Canada, PA-C

## 2023-10-20 NOTE — Assessment & Plan Note (Signed)
 Chronic, blood pressure above goal in office today.  She will verify whether she is taking half a tablet or full tablet of metoprolol .  She is off HCTZ currently given hyponatremia.

## 2023-10-20 NOTE — Assessment & Plan Note (Signed)
 Now off HCTZ, due for reevaluation.

## 2023-10-20 NOTE — Assessment & Plan Note (Signed)
 New diagnosis, acute  Patient rate controlled with metoprolol  but blood pressure remaining high. She plans to call to get appointment with cardiology Dr. Addie Holstein. No anticoagulate has been started at this point but she is CHA2DS2-VASc 6.    Details    Points Metrics  1 Has Congestive Heart Failure:  Yes    Current as of 2 hours ago  1 Has Vascular Disease:  Yes    Current as of 2 hours ago  1 Has Hypertension:  Yes    Current as of 2 hours ago  2 Age:  83    Current as of 2 hours ago  0 Has Diabetes Excluding Gestational Diabetes:  No    Current as of 2 hours ago  0 Had Stroke:  No  Had TIA:  No  Had Thromboembolism:  No    Current as of 2 hours ago  1 Female:  Yes    Current as of 2 hours ago

## 2023-10-25 ENCOUNTER — Telehealth: Payer: Self-pay

## 2023-10-25 DIAGNOSIS — R072 Precordial pain: Secondary | ICD-10-CM

## 2023-10-25 MED ORDER — METOPROLOL TARTRATE 75 MG PO TABS: ORAL_TABLET | ORAL | 0 refills | Status: DC

## 2023-10-25 NOTE — Telephone Encounter (Signed)
 Per Cadence, the patient needs a Cardiac CTA instead of a Cardiac PET. She also recommended that the patient take Metoprolol  tartrate 75 mg in addition to the scheduled dose of Toprol  37.5 mg daily.  Orders have been placed, and instructions were provided to the patient. The nurse will also place the instructions at the front desk for pickup

## 2023-10-26 ENCOUNTER — Encounter

## 2023-10-30 ENCOUNTER — Ambulatory Visit: Attending: Medical

## 2023-10-30 DIAGNOSIS — R0602 Shortness of breath: Secondary | ICD-10-CM | POA: Diagnosis not present

## 2023-10-30 LAB — ECHOCARDIOGRAM COMPLETE
AR max vel: 1.93 cm2
AV Area VTI: 1.89 cm2
AV Area mean vel: 1.9 cm2
AV Mean grad: 4 mmHg
AV Peak grad: 7.4 mmHg
Ao pk vel: 1.36 m/s
Area-P 1/2: 2.76 cm2
Calc EF: 63.2 %
S' Lateral: 3.2 cm
Single Plane A2C EF: 62.3 %
Single Plane A4C EF: 62 %

## 2023-10-31 ENCOUNTER — Ambulatory Visit: Payer: Self-pay | Admitting: Medical

## 2023-11-07 ENCOUNTER — Encounter (HOSPITAL_COMMUNITY): Payer: Self-pay

## 2023-11-09 ENCOUNTER — Encounter: Payer: Self-pay | Admitting: Internal Medicine

## 2023-11-09 ENCOUNTER — Ambulatory Visit
Admission: RE | Admit: 2023-11-09 | Discharge: 2023-11-09 | Disposition: A | Discharge status: Home / Self Care | Source: Ambulatory Visit | Attending: Medical | Admitting: Medical

## 2023-11-09 ENCOUNTER — Ambulatory Visit: Admitting: Internal Medicine

## 2023-11-09 VITALS — BP 128/68 | HR 64 | Temp 98.3°F | Ht 66.5 in | Wt 173.0 lb

## 2023-11-09 DIAGNOSIS — J411 Mucopurulent chronic bronchitis: Secondary | ICD-10-CM | POA: Diagnosis not present

## 2023-11-09 DIAGNOSIS — J479 Bronchiectasis, uncomplicated: Secondary | ICD-10-CM

## 2023-11-09 DIAGNOSIS — R079 Chest pain, unspecified: Secondary | ICD-10-CM

## 2023-11-09 DIAGNOSIS — I700 Atherosclerosis of aorta: Secondary | ICD-10-CM | POA: Diagnosis not present

## 2023-11-09 LAB — NM PET CT CARDIAC PERFUSION MULTI W/ABSOLUTE BLOODFLOW
MBFR: 1.86
Nuc Rest EF: 60 %
Nuc Stress EF: 66 %
Peak HR: 82 {beats}/min
Rest HR: 67 {beats}/min
Rest MBF: 1.11 ml/g/min
Rest Nuclear Isotope Dose: 20.5 mCi
SRS: 0
SSS: 0
ST Depression (mm): 0 mm
Stress MBF: 2.06 ml/g/min
Stress Nuclear Isotope Dose: 20.6 mCi
TID: 0.96

## 2023-11-09 MED ORDER — REGADENOSON 0.4 MG/5ML IV SOLN
0.4000 mg | Freq: Once | INTRAVENOUS | Status: AC
  Administered 2023-11-09: 0.4 mg via INTRAVENOUS
  Filled 2023-11-09: qty 5

## 2023-11-09 MED ORDER — RUBIDIUM RB82 GENERATOR (RUBYFILL)
25.0000 | PACK | Freq: Once | INTRAVENOUS | Status: AC
  Administered 2023-11-09: 20.64 via INTRAVENOUS

## 2023-11-09 MED ORDER — REGADENOSON 0.4 MG/5ML IV SOLN
INTRAVENOUS | Status: AC
  Filled 2023-11-09: qty 5

## 2023-11-09 MED ORDER — RUBIDIUM RB82 GENERATOR (RUBYFILL)
25.0000 | PACK | Freq: Once | INTRAVENOUS | Status: AC
  Administered 2023-11-09: 20.46 via INTRAVENOUS

## 2023-11-09 NOTE — Progress Notes (Signed)
 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 No frank honeycombing. No definite progression since 11/27/2016   04/02/2020 Follow up : Bronchiectasis and ILD Patient has underlying mild bronchiectasis and ILD. Is independent and drives.  She has not on oxygen.  She declines the Covid vaccines and says she absolutely will not get this.   Previous high-resolution CT chest in April 2020 showed stable ILD and mild bronchiectasis with no progression since 2018. She is supposed to be using the flutter valve but says that she is not using this.     CHIEF COMPLAINT:   Follow-up assessment for bronchiectasis Follow-up assessment for chronic bronchitis    HISTORY OF PRESENT ILLNESS   Patient uses albuterol  inhaler as needed Very infrequent use Has not been using advair  only as needed  No exacerbation at this time No evidence of heart failure at this time No evidence or signs of infection at this time No respiratory distress No fevers, chills, nausea, vomiting, diarrhea No evidence of lower extremity edema No evidence hemoptysis   Non Smoker Patient is noncompliant with flutter valve Recommend 10-15 times per day  No significant respiratory compromise at this time Patient doing well overall   Past Medical History:  Diagnosis Date   Agatston CAC score, <100 05/04/2021   Coronary CTA : Coronary Calcium  Score 27.  Minimal proximal (dominant) RCA calcified plaque (0-24%) with no plaque noted in the LAD and nondominant LCx.   Hyperlipidemia with target LDL less than 100 03/27/2009   Qualifier: Diagnosis of  By: Cherlyn Cornet MD, Amy     Hypertension     Social History   Tobacco Use   Smoking status: Never   Smokeless tobacco: Never   Vaping Use   Vaping status: Never Used  Substance Use Topics   Alcohol use: No    Alcohol/week: 0.0 standard drinks of alcohol   Drug use: No     MEDICATIONS    Home Medication:  Current Outpatient Rx   Order #: [id number] Class: Historical Med   Order #: [id number] Class: Normal   Order #: [id number] Class: Normal   Order #: [id number] Class: Historical Med   Order #: [id number] Class: Historical Med   Order #: [id number] Class: Historical Med   Order #: [id number] Class: Historical Med   Order #: [id number] Class: Historical Med   Order #: [id number] Class: Normal   Order #: [id number] Class: Historical Med   Order #: [id number] Class: Normal   Order #: [id number] Class: Normal   Order #: [id number] Class: Historical Med   Order #: [id number] Class: Historical Med   Order #: [id number] Class: Normal   Order #: [id number] Class: Normal   Order #: [id number] Class: Historical Med   Order #: [id number] Class: Print   Order #: [id number] Class: Normal   Order #: [id number] Class: Historical Med   Order #: [id number] Class: Historical Med    Current Medication:  Current Outpatient Medications:    acetaminophen  (TYLENOL ) 500 MG tablet, Take 500 mg by mouth every 6 (six) hours as needed., Disp: , Rfl:    albuterol  (VENTOLIN  HFA) 108 (90 Base) MCG/ACT inhaler, Inhale 2 puffs into the lungs every 4 (four) hours as needed for wheezing or shortness of breath., Disp: 8 g, Rfl: 10   apixaban  (ELIQUIS ) 5 MG TABS tablet, Take 1 tablet (5 mg total) by mouth 2 (two) times daily., Disp: 180 tablet,  Rfl: 3   Biotin  10 MG TABS, Take 1 tablet by mouth daily., Disp: , Rfl:    cetirizine (ZYRTEC) 10 MG tablet, Take 10 mg by mouth daily as needed for allergies., Disp: , Rfl:    Cholecalciferol  (VITAMIN D3) 50 MCG (2000 UT) TABS, Take 1 tablet by mouth daily., Disp: , Rfl:    CRANBERRY PO, Take 1 capsule by mouth daily., Disp: , Rfl:    diclofenac  sodium (VOLTAREN ) 1 % GEL, Apply 4 g topically 4 (four) times daily as needed., Disp: ,  Rfl:    famciclovir  (FAMVIR ) 250 MG tablet, TAKE 1 TABLET DAILY, Disp: 90 tablet, Rfl: 3   fluticasone  (FLONASE ) 50 MCG/ACT nasal spray, Place 2 sprays into both nostrils as needed. , Disp: , Rfl:    fluticasone -salmeterol (ADVAIR  HFA) 115-21 MCG/ACT inhaler, USE 2 INHALATIONS TWICE A DAY, Disp: 3 each, Rfl: 2   losartan  (COZAAR ) 100 MG tablet, TAKE 1 TABLET DAILY, Disp: 90 tablet, Rfl: 0   meloxicam  (MOBIC ) 15 MG tablet, Take 15 mg by mouth daily., Disp: , Rfl:    methocarbamol  (ROBAXIN ) 500 MG tablet, Take 500 mg by mouth 3 (three) times daily., Disp: , Rfl:    metoprolol  succinate (TOPROL  XL) 25 MG 24 hr tablet, Take 1.5 tablets (37.5 mg total) by mouth daily., Disp: 180 tablet, Rfl: 3   metoprolol  tartrate 75 MG TABS, TAKE 1 TABLET 2 HR PRIOR TO CARDIAC PROCEDURE, Disp: 1 tablet, Rfl: 0   Multiple Minerals-Vitamins (CAL-MAG-ZINC-D PO), Take 1 tablet by mouth daily., Disp: , Rfl:    Respiratory Therapy Supplies (FLUTTER) DEVI, 1 Device by Does not apply route as directed., Disp: 1 each, Rfl: 0   rosuvastatin  (CRESTOR ) 10 MG tablet, TAKE 1 TABLET DAILY, Disp: 90 tablet, Rfl: 3   vitamin B-12 (CYANOCOBALAMIN ) 1000 MCG tablet, Take 1,000 mcg by mouth daily., Disp: , Rfl:    vitamin E  180 MG (400 UNITS) capsule, Take 400 Units by mouth daily., Disp: , Rfl:     ALLERGIES   Amlodipine  besylate and Lipitor [atorvastatin ]   BP 128/68 (BP Location: Right Arm, Patient Position: Sitting, Cuff Size: Normal)   Pulse 64   Temp 98.3 F (36.8 C) (Oral)   Ht 5' 6.5" (1.689 m)   Wt 173 lb (78.5 kg)   SpO2 93%   BMI 27.50 kg/m      Review of Systems: Gen:  Denies  fever, sweats, chills weight loss  HEENT: Denies blurred vision, double vision, ear pain, eye pain, hearing loss, nose bleeds, sore throat Cardiac:  No dizziness, chest pain or heaviness, chest tightness,edema, No JVD Resp:   No cough, -sputum production, -shortness of breath,-wheezing, -hemoptysis,  Other:  All other systems  negative   Physical Examination:   General Appearance: No distress  EYES PERRLA, EOM intact.   NECK Supple, No JVD Pulmonary: normal breath sounds, No wheezing.  CardiovascularNormal S1,S2.  No m/r/g.   Abdomen: Benign, Soft, non-tender. Neurology UE/LE 5/5 strength, no focal deficits Ext pulses intact, cap refill intact ALL OTHER ROS ARE NEGATIVE          IMAGING    I have Independently reviewed images of  CT chest 10/10/18 Interpretation: minimal b/l lower lobe predominant peripheral  ILD consider NSIP mild bronchiectasis No significant changes since 2018     I have Independently reviewed images of  CT chest 11/2016   Interpretation:minimal b/l lower lobe predominant peripheral  ILD consider NSIP mild bronchiectasis      ASSESSMENT/PLAN  83 year old pleasant white female seen today for follow-up assessment for chronic productive cough chronic bronchitis with chronic allergic rhinitis in the setting of 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  At this time her symptoms seem to be stable at this time without any maintenance therapy  Patient without any respiratory compromise   Chronic bronchitis chronic bronchiectasis Recommend daily activities Use incentive spirometry Use inhalers as needed No exacerbation at this time No evidence of heart failure at this time No evidence or signs of infection at this time No respiratory distress No fevers, chills, nausea, vomiting, diarrhea No evidence of lower extremity edema No evidence hemoptysis No indication for antibiotics or prednisone  at this time  Chronic bronchitis I have explained to her encouraged her to use her flutter valve 10-15 times per day Continue exercise as tolerated  Allergic rhinitis Recommend restarting Flonase      MEDICATION ADJUSTMENTS/LABS AND TESTS ORDERED: Avoid Allergens and Irritants Avoid secondhand smoke Avoid SICK  contacts Recommend  Masking  when appropriate Recommend Keep up-to-date with vaccinations Use albuterol  and Advair  as needed Restart Flonase   CURRENT MEDICATIONS REVIEWED AT LENGTH WITH PATIENT TODAY  Follow-up in 1 year   Total time spent 41 minutes  Brekyn Huntoon Nestora Baptise, M.D.  [facility]  Medical Director [facility] Medical Director [facility] Cardio-Pulmonary Department

## 2023-11-09 NOTE — Progress Notes (Signed)
 Patient presents for a cardiac PET stress test and tolerated procedure without incident. Patient maintained acceptable vital signs throughout the test and was offered caffeine after test.  Patient ambulated out of department with a steady gait.

## 2023-11-09 NOTE — Patient Instructions (Signed)
 Keep Smiling!!  Please use Advair  as needed Please use albuterol  as needed You can start using Flonase  to help with nasal congestion  Avoid Allergens and Irritants Avoid secondhand smoke Avoid SICK contacts Recommend  Masking  when appropriate Recommend Keep up-to-date with vaccinations

## 2023-11-17 ENCOUNTER — Telehealth (HOSPITAL_COMMUNITY): Payer: Self-pay | Admitting: *Deleted

## 2023-11-17 NOTE — Telephone Encounter (Signed)
 Attempted to call patient about canceling her upcoming cardiac CT appt. Left message on voicemail with name and callback number  Chase Copping RN Navigator Cardiac Imaging [facility] [phone] Office [phone] Cell

## 2023-11-20 ENCOUNTER — Ambulatory Visit: Admission: RE | Admit: 2023-11-20 | Source: Ambulatory Visit

## 2023-11-21 ENCOUNTER — Encounter: Payer: Self-pay | Admitting: Dermatology

## 2023-11-21 ENCOUNTER — Ambulatory Visit: Payer: Medicare Other | Admitting: Dermatology

## 2023-11-21 DIAGNOSIS — L820 Inflamed seborrheic keratosis: Secondary | ICD-10-CM | POA: Diagnosis not present

## 2023-11-21 DIAGNOSIS — L578 Other skin changes due to chronic exposure to nonionizing radiation: Secondary | ICD-10-CM

## 2023-11-21 DIAGNOSIS — R002 Palpitations: Secondary | ICD-10-CM | POA: Diagnosis not present

## 2023-11-21 DIAGNOSIS — L570 Actinic keratosis: Secondary | ICD-10-CM

## 2023-11-21 DIAGNOSIS — W908XXA Exposure to other nonionizing radiation, initial encounter: Secondary | ICD-10-CM

## 2023-11-21 DIAGNOSIS — L821 Other seborrheic keratosis: Secondary | ICD-10-CM

## 2023-11-21 DIAGNOSIS — D229 Melanocytic nevi, unspecified: Secondary | ICD-10-CM

## 2023-11-21 DIAGNOSIS — Z872 Personal history of diseases of the skin and subcutaneous tissue: Secondary | ICD-10-CM | POA: Diagnosis not present

## 2023-11-21 DIAGNOSIS — I8393 Asymptomatic varicose veins of bilateral lower extremities: Secondary | ICD-10-CM | POA: Diagnosis not present

## 2023-11-21 DIAGNOSIS — L814 Other melanin hyperpigmentation: Secondary | ICD-10-CM

## 2023-11-21 DIAGNOSIS — I781 Nevus, non-neoplastic: Secondary | ICD-10-CM | POA: Diagnosis not present

## 2023-11-21 DIAGNOSIS — D225 Melanocytic nevi of trunk: Secondary | ICD-10-CM | POA: Diagnosis not present

## 2023-11-21 DIAGNOSIS — Z1283 Encounter for screening for malignant neoplasm of skin: Secondary | ICD-10-CM | POA: Diagnosis not present

## 2023-11-21 DIAGNOSIS — D1801 Hemangioma of skin and subcutaneous tissue: Secondary | ICD-10-CM

## 2023-11-21 NOTE — Patient Instructions (Addendum)

## 2023-11-21 NOTE — Progress Notes (Signed)
 Follow-Up Visit   Subjective  Patty Shelton is a 83 y.o. female who presents for the following: Skin Cancer Screening and Full Body Skin Exam  The patient presents for Total-Body Skin Exam (TBSE) for skin cancer screening and mole check. The patient has spots, moles and lesions to be evaluated, some may be new or changing. She has a few spots on the forearms that she picks at and a dry spot on her nose. History of AK. No history of skin cancer.     The following portions of the chart were reviewed this encounter and updated as appropriate: medications, allergies, medical history  Review of Systems:  No other skin or systemic complaints except as noted in HPI or Assessment and Plan.  Objective  Well appearing patient in no apparent distress; mood and affect are within normal limits.  A full examination was performed including scalp, head, eyes, ears, nose, lips, neck, chest, axillae, abdomen, back, buttocks, bilateral upper extremities, bilateral lower extremities, hands, feet, fingers, toes, fingernails, and toenails. All findings within normal limits unless otherwise noted below.   Relevant physical exam findings are noted in the Assessment and Plan.  L forearm x 2, R forearm x 1 (3) Erythematous stuck-on, waxy papule or plaque R lower nasal dorsum x 1, central upper forehead x 1 (2) Pink scaly macules.   Assessment & Plan   SKIN CANCER SCREENING PERFORMED TODAY.  ACTINIC DAMAGE - Chronic condition, secondary to cumulative UV/sun exposure - diffuse scaly erythematous macules with underlying dyspigmentation - Recommend daily broad spectrum sunscreen SPF 30+ to sun-exposed areas, reapply every 2 hours as needed.  - Staying in the shade or wearing long sleeves, sun glasses (UVA+UVB protection) and wide brim hats (4-inch brim around the entire circumference of the hat) are also recommended for sun protection.  - Call for new or changing lesions.  LENTIGINES, SEBORRHEIC  KERATOSES, HEMANGIOMAS - Benign normal skin lesions - Benign-appearing - Call for any changes  MELANOCYTIC NEVI - Tan-brown and/or pink-flesh-colored symmetric macules and papules. Flesh papule at left lower back.  - Benign appearing on exam today - Observation - Call clinic for new or changing moles - Recommend daily use of broad spectrum spf 30+ sunscreen to sun-exposed areas.   Varicose Veins/Spider Veins - Dilated blue, purple or red veins at the lower extremities - Reassured - Smaller vessels can be treated by sclerotherapy (a procedure to inject a medicine into the veins to make them disappear) if desired, but the treatment is not covered by insurance. Larger vessels may be covered if symptomatic and we would refer to vascular surgeon if treatment desired.   INFLAMED SEBORRHEIC KERATOSIS (3) L forearm x 2, R forearm x 1 (3) Symptomatic, irritating, patient would like treated. Destruction of lesion - L forearm x 2, R forearm x 1 (3)  Destruction method: cryotherapy   Informed consent: discussed and consent obtained   Lesion destroyed using liquid nitrogen: Yes   Region frozen until ice ball extended beyond lesion: Yes   Outcome: patient tolerated procedure well with no complications   Post-procedure details: wound care instructions given   Additional details:  Prior to procedure, discussed risks of blister formation, small wound, skin dyspigmentation, or rare scar following cryotherapy. Recommend Vaseline ointment to treated areas while healing.  AK (ACTINIC KERATOSIS) (2) R lower nasal dorsum x 1, central upper forehead x 1 (2) Actinic keratoses are precancerous spots that appear secondary to cumulative UV radiation exposure/sun exposure over time. They are chronic  with expected duration over 1 year. A portion of actinic keratoses will progress to squamous cell carcinoma of the skin. It is not possible to reliably predict which spots will progress to skin cancer and so treatment  is recommended to prevent development of skin cancer.  Recommend daily broad spectrum sunscreen SPF 30+ to sun-exposed areas, reapply every 2 hours as needed.  Recommend staying in the shade or wearing long sleeves, sun glasses (UVA+UVB protection) and wide brim hats (4-inch brim around the entire circumference of the hat). Call for new or changing lesions. Destruction of lesion - R lower nasal dorsum x 1, central upper forehead x 1 (2)  Destruction method: cryotherapy   Informed consent: discussed and consent obtained   Lesion destroyed using liquid nitrogen: Yes   Region frozen until ice ball extended beyond lesion: Yes   Outcome: patient tolerated procedure well with no complications   Post-procedure details: wound care instructions given   Additional details:  Prior to procedure, discussed risks of blister formation, small wound, skin dyspigmentation, or rare scar following cryotherapy. Recommend Vaseline ointment to treated areas while healing.  Return in about 1 year (around 11/20/2024) for TBSE, Hx AKs.  IBernardine Bridegroom, CMA, am acting as scribe for Artemio Larry, MD .   Documentation: I have reviewed the above documentation for accuracy and completeness, and I agree with the above.  Artemio Larry, MD

## 2023-11-22 ENCOUNTER — Other Ambulatory Visit: Payer: Self-pay

## 2023-11-22 ENCOUNTER — Other Ambulatory Visit (HOSPITAL_COMMUNITY): Payer: Self-pay

## 2023-11-22 ENCOUNTER — Telehealth: Payer: Self-pay | Admitting: Cardiology

## 2023-11-22 ENCOUNTER — Telehealth: Payer: Self-pay | Admitting: Pharmacy Technician

## 2023-11-22 NOTE — Telephone Encounter (Signed)
 Hi, I called the patients pharmacy and her eliquis  is ready for her at 43.00 for 30 days. I called the patient and she said she [facility] she would like her prescriptions from there now instead of [facility]. She said she will get the eliquis  from [facility] this time. Hollace Lund, RN to Rx Prior Auth Team    11/22/23  3:23 PM Please advise if you can :)   11/22/23  2:59 PM Price, Merideth Stands, RN routed this conversation to [facility] Triage   11/22/23  2:16 PM Janus Mercury routed this conversation to [facility] Triage Janus Mercury SB   11/22/23  2:16 PM Note Pt c/o medication issue:   1. Name of Medication:   apixaban  (ELIQUIS ) 5 MG TABS tablet      2. How are you currently taking this medication (dosage and times per day)?  Take 1 tablet (5 mg total) by mouth 2 (two) times daily.         3. Are you having a reaction (difficulty breathing--STAT)? No   4. What is your medication issue? Pt is requesting a callback regarding her stating that [employer] is requesting a prior auth or prescription number be sent to them at fax [phone]. She'd like to discuss further with nurse. Please advise

## 2023-11-22 NOTE — Telephone Encounter (Signed)
 Pt c/o medication issue:  1. Name of Medication:   apixaban  (ELIQUIS ) 5 MG TABS tablet    2. How are you currently taking this medication (dosage and times per day)?  Take 1 tablet (5 mg total) by mouth 2 (two) times daily.      3. Are you having a reaction (difficulty breathing--STAT)? No  4. What is your medication issue? Pt is requesting a callback regarding her stating that [employer] is requesting a prior auth or prescription number be sent to them at fax [phone]. She'd like to discuss further with nurse. Please advise

## 2023-12-01 DIAGNOSIS — M5432 Sciatica, left side: Secondary | ICD-10-CM | POA: Diagnosis not present

## 2023-12-18 DIAGNOSIS — M5432 Sciatica, left side: Secondary | ICD-10-CM | POA: Diagnosis not present

## 2023-12-25 ENCOUNTER — Ambulatory Visit: Admitting: Medical

## 2023-12-27 DIAGNOSIS — M7062 Trochanteric bursitis, left hip: Secondary | ICD-10-CM | POA: Diagnosis not present

## 2023-12-28 ENCOUNTER — Encounter: Payer: Self-pay | Admitting: Cardiology

## 2023-12-28 ENCOUNTER — Ambulatory Visit: Attending: Cardiology | Admitting: Cardiology

## 2023-12-28 VITALS — BP 130/60 | HR 67 | Ht 66.5 in | Wt 167.0 lb

## 2023-12-28 DIAGNOSIS — R002 Palpitations: Secondary | ICD-10-CM | POA: Insufficient documentation

## 2023-12-28 DIAGNOSIS — E785 Hyperlipidemia, unspecified: Secondary | ICD-10-CM | POA: Insufficient documentation

## 2023-12-28 DIAGNOSIS — I10 Essential (primary) hypertension: Secondary | ICD-10-CM | POA: Insufficient documentation

## 2023-12-28 DIAGNOSIS — R0609 Other forms of dyspnea: Secondary | ICD-10-CM | POA: Diagnosis not present

## 2023-12-28 DIAGNOSIS — I480 Paroxysmal atrial fibrillation: Secondary | ICD-10-CM | POA: Insufficient documentation

## 2023-12-28 DIAGNOSIS — I5032 Chronic diastolic (congestive) heart failure: Secondary | ICD-10-CM | POA: Diagnosis not present

## 2023-12-28 DIAGNOSIS — I25118 Atherosclerotic heart disease of native coronary artery with other forms of angina pectoris: Secondary | ICD-10-CM | POA: Diagnosis not present

## 2023-12-28 DIAGNOSIS — D6869 Other thrombophilia: Secondary | ICD-10-CM | POA: Diagnosis not present

## 2023-12-28 DIAGNOSIS — J849 Interstitial pulmonary disease, unspecified: Secondary | ICD-10-CM | POA: Diagnosis not present

## 2023-12-28 MED ORDER — APIXABAN 5 MG PO TABS: 5.0000 mg | ORAL_TABLET | Freq: Two times a day (BID) | ORAL | Status: DC

## 2023-12-28 MED ORDER — APIXABAN 5 MG PO TABS: 5.0000 mg | ORAL_TABLET | Freq: Two times a day (BID) | ORAL | 3 refills | Status: DC

## 2023-12-28 NOTE — Patient Instructions (Signed)
 Medication Instructions:  Your physician recommends the following medication changes.  START TAKING: Elequis 5 mg 2 times daily.  *If you need a refill on your cardiac medications before your next appointment, please call your pharmacy*  Lab Work: None ordered at this time  If you have labs (blood work) drawn today and your tests are completely normal, you will receive your results only by: MyChart Message (if you have MyChart) OR A paper copy in the mail If you have any lab test that is abnormal or we need to change your treatment, we will call you to review the results.  Testing/Procedures: None ordered at this time   Follow-Up: At [facility], you and your health needs are our priority.  As part of our continuing mission to provide you with exceptional heart care, our providers are all part of one team.  This team includes your primary Cardiologist (physician) and Advanced Practice Providers or APPs (Physician Assistants and Nurse Practitioners) who all work together to provide you with the care you need, when you need it.  Your next appointment:   6 month(s)  Provider:   Alm Clay, MD or Cadence [place], [place]    We recommend signing up for the patient portal called MyChart.  Sign up information is provided on this After Visit Summary.  MyChart is used to connect with patients for Virtual Visits (Telemedicine).  Patients are able to view lab/test results, encounter notes, upcoming appointments, etc.  Non-urgent messages can be sent to your provider as well.   To learn more about what you can do with MyChart, go to [email or web address].

## 2023-12-28 NOTE — Progress Notes (Signed)
 Cardiology Office Note:  .   Date:  12/30/2023  ID:  Patty Shelton, DOB 05-Oct-1940, MRN [id number] PCP: Avelina Greig BRAVO, MD  [facility] Providers Cardiologist:  Alm Clay, MD     Chief Complaint  Patient presents with   Follow up Zio monitor & PET CT scan     Doing well.    Atrial Fibrillation    Recently diagnosed A-fib.  Somewhat asymptomatic.    Patient Profile: Patty Shelton     Patty Shelton is a  83 y.o. female with a PMH notable for HTN, HLD, ILD/Bronchiectasis/Asthma, CKD3 who presents here for f/u with New Dx of PAF at the request of Avelina Greig BRAVO, MD.  I last saw her in Nov 2022.  [facility] 07/2023: Flu-A & CP. -=> started Toprol  12.5 mg [facility] ER 10/13/23: Dizziness & fall while watering plants - tripped on step, NO LOC. ==> noted to be in Afib ~ 102 bpm; d/c on Metoprolol  . Hydrochlorothiazide  d/c 2/2 HypoNa+.       Patty Shelton was last seen by Mikey Fishman, PA on 10/20/2023 with c/o palpitations & recent Dx of Afib.  Was back in NSR.   Occasional chest heaviness while working & occasional DOE. => started Eliquis  5 mg BID & increased to 37.5 mg Toprol .  Cor CTA ordered to exclude ischemic CAD. => was converted to Stress PET>   Subjective  Discussed the use of AI scribe software for clinical note transcription with the patient, who gave verbal consent to proceed.  History of Present Illness  Patty Shelton is an 83 year old female with atrial fibrillation who presents for follow-up after recent emergency room visits.  She has been diagnosed with atrial fibrillation following recent emergency room visits. She is not aware of any palpitations or irregular heart rate while wearing a home monitor, which indicated AFib less than 1% of the time, with the longest episode lasting just over an hour. She did not experience any symptoms during these episodes.  She recalls a fall that led to an emergency room visit but does not remember any unusual heart sensations at that time.  The monitor showed some premature beats and a heart rate increase to 154 beats per minute during AFib episodes, which occurred mostly at night or midday without her noticing.  She has been pretty stable no cardiac symptoms standpoint with no chest pain, significant shortness of breath, or other symptoms related to AFib.  No syncope or near STEMI, no TIA or more severe AS.  No melena, hematochezia, hematuria or epistaxis on Eliquis .  No PND, orthopnea or edema.  Her current medications include metoprolol , which she believes helps in calming the AFib spells, and a blood thinner to prevent stroke. She feels much better.  She underwent a coronary CT angiography back in 2022 and a Stress Test in May, both showing no significant issues with blood flow to the heart. An Echocardiogram in May showed normal pump function with an ejection fraction of 60-65%. There is some coronary calcification noted.  She is on losartan  for blood pressure, Toprol  XL 37.5 mg for heart rate control, and Crestor  for cholesterol management. Her last cholesterol check in January showed an LDL of 87.     Objective   Pertinent medications - Eliquis  5 mg twice daily--has been out for 1 week-waiting on assistance package. - Losartan  100 mg - Toprol  XL 25 mg-1 1/2 tab (37.5 mg) daily - Crestor  10 mg  -As needed  albuterol  and Flonase .  Studies Reviewed: Patty Shelton   EKG Interpretation Date/Time:  Thursday December 28 2023 09:35:53 EDT Ventricular Rate:  67 PR Interval:  156 QRS Duration:  90 QT Interval:  396 QTC Calculation: 418 R Axis:   -38  Text Interpretation: Normal sinus rhythm Left axis deviation Nonspecific ST abnormality When compared with ECG of 20-Oct-2023 11:13, No significant change was found Confirmed by Anner Lenis ([id number]) on 12/28/2023 9:43:48 AM    Lab Results  Component Value Date   CHOL 167 07/04/2023   HDL 57.90 07/04/2023   LDLCALC 87 07/04/2023   LDLDIRECT 180.6 04/04/2011   TRIG 111.0 07/04/2023    CHOLHDL 3 07/04/2023    Recent CV Studies Echocardiogram: Ejection fraction 60-65%, no wall motion abnormalities, grade 1 diastolic dysfunction, mildly enlarged right ventricle, normal mitral and aortic valves (10/30/2023) Stress PET: No ischemia or infarction.  INTERMEDIATE RISK due to mildly reduced myocardial blood flow reserve-could represent microvascular disease versus balanced ischemia however no TID noted.  Resting LVEF 60%, stress EF 66%.  (11/09/2023):  Zio patch monitor: Mostly NSR (50-101 bpm, avg 66bpm). Rare PAC/PVCs (<1%). 1% Afib burden - longest ~1hr 22 min, HR rance 58-145 bpm (11/21/2023)  Prior Studies:  Echocardiogram (December 2022): EF of 55 to 60%, no wall motion abnormality, grade 1 diastolic dysfunction, mild LA dilation, normal RV size and function.  Coronary CTA (November 2022): CAC 20; minimal Ca Plaque in prox RCA (dominant) ~0 to 24%  & NO plaque noted in the LAD and nondominant LCx.    Risk Assessment/Calculations:    CHA2DS2-VASc Score = 5   This indicates a 7.2% annual risk of stroke. The patient's score is based upon: CHF History: 0 HTN History: 1 Diabetes History: 0 Stroke History: 0 Vascular Disease History: 1 Age Score: 2 Gender Score: 1           Physical Exam:   VS:  BP 130/60 (BP Location: Left Arm, Patient Position: Sitting, Cuff Size: Normal)   Pulse 67   Ht 5' 6.5 (1.689 m)   Wt 167 lb (75.8 kg)   SpO2 97%   BMI 26.55 kg/m    Wt Readings from Last 3 Encounters:  12/28/23 167 lb (75.8 kg)  11/09/23 173 lb (78.5 kg)  11/09/23 173 lb (78.5 kg)    GEN: Well nourished, well groomed in no acute distress; appearing. NECK: No JVD; No carotid bruits CARDIAC: Normal S1, S2; RRR, no murmurs, rubs, gallops RESPIRATORY:  Clear to auscultation without rales, wheezing or rhonchi ; nonlabored, good air movement. ABDOMEN: Soft, non-tender, non-distended EXTREMITIES:  No edema; No deformity      ASSESSMENT AND PLAN: .    Problem List  Items Addressed This Visit       Cardiology Problems   Chronic diastolic CHF (congestive heart failure) (HCC) (Chronic)   Minimal symptoms with no signs of edema or PND, orthopnea. She is on losartan  100 mg daily along with Toprol  37.5 mg daily.  No diuretic requirement .-Continue current meds.       Relevant Medications   apixaban  (ELIQUIS ) 5 MG TABS tablet   apixaban  (ELIQUIS ) 5 MG TABS tablet   Coronary atherosclerosis (Chronic)   Minimal disease noted on Coronary CTA with negative stress PET.  I doubt very seriously that there is evidence of microvascular disease that was potentially suggested with a coronary intermediate risk test.  Definitely no aspirin as she is now on Eliquis . 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  No bleeding issues. Will currently provide samples for Eliquis  and ensure that her prescription goes to [facility] which is now able to get her the medications at a favorable cost.  .  Given her relatively low A-fib burden, should be fine to hold Eliquis  2 to 3 days preop or surgical procedures per protocol.      Relevant Medications   apixaban  (ELIQUIS ) 5 MG TABS tablet   apixaban  (ELIQUIS ) 5 MG TABS tablet   Hyperlipidemia with target LDL less than 100 (Chronic)   Hyperlipidemia managed with Crestor  10 mg. LDL 87 in January, higher than desired. - Continue Crestor  10 mg daily. - Provided dietary guidelines focusing on reducing starchy, sugary foods, and animal fats. - Recheck cholesterol levels at next primary care visit. Consider increasing Crestor  dose if LDL remains elevated.      Relevant Medications   apixaban  (ELIQUIS ) 5 MG TABS tablet    apixaban  (ELIQUIS ) 5 MG TABS tablet   Paroxysmal atrial fibrillation (HCC) - Primary (Chronic)   Paroxysmal atrial fibrillation with <1% burden, longest episode 1 hour 22 minutes, heart rate up to 145 bpm, no significant symptoms. Stroke risk due to potential clot formation. - Continue Toprol  37.5 mg for rate control, and Eliquis  5 mg twice daily - Ensure prescription and refills for Eliquis  or Xarelto for stroke prevention. - Monitor for prolonged AFib episodes or symptoms such as fatigue, shortness of breath, or chest pain. - Provided information on atrial fibrillation and healthy diets.      Relevant Medications   apixaban  (ELIQUIS ) 5 MG TABS tablet   apixaban  (ELIQUIS ) 5 MG TABS tablet   Primary hypertension (Chronic)   Hypertension managed with losartan  100 mg and Toprol  37.5 mg, currently well-controlled. - Continue current dose of losartan  and Toprol .      Relevant Medications   apixaban  (ELIQUIS ) 5 MG TABS tablet   apixaban  (ELIQUIS ) 5 MG TABS tablet     Other   DOE (dyspnea on exertion) (Chronic)   Relevant Orders   EKG 12-Lead (Completed)   ILD (interstitial lung disease) (HCC)   Other Visit Diagnoses       Palpitations       Relevant Orders   EKG 12-Lead (Completed)            Follow-Up: Return in about 6 months (around 06/29/2024) for Alternate 6 month follow-up with APP & MD.  Total time spent: 25 min spent with patient + 28 min spent charting = 53 min I spent 53 minutes in the care of Patty Shelton today including reviewing labs (1 minute), reviewing studies (echo, monitor and stress PET all reviewed-8 minutes), face to face time discussing treatment options (25 minutes), reviewing records from H&P notes-Sheri hammock and Cadence Furth, [place] as well as ER visit note from 10/13/2023 when she was first diagnosed with A-fib. (10 minutes), 9 minutes dictating, and documenting in the encounter.    Signed, Alm MICAEL Clay, MD, MS Alm Clay, M.D.,  M.S. Interventional Chartered certified accountant  Pager # [phone]

## 2023-12-30 ENCOUNTER — Encounter: Payer: Self-pay | Admitting: Cardiology

## 2023-12-30 NOTE — Assessment & Plan Note (Signed)
 Hypertension managed with losartan  100 mg and Toprol  37.5 mg, currently well-controlled. - Continue current dose of losartan  and Toprol .

## 2023-12-30 NOTE — Assessment & Plan Note (Addendum)
 Paroxysmal atrial fibrillation with <1% burden, longest episode 1 hour 22 minutes, heart rate up to 145 bpm, no significant symptoms. Stroke risk due to potential clot formation. - Continue Toprol  37.5 mg for rate control, and Eliquis  5 mg twice daily - Ensure prescription and refills for Eliquis  or Xarelto for stroke prevention. - Monitor for prolonged AFib episodes or symptoms such as fatigue, shortness of breath, or chest pain. - Provided information on atrial fibrillation and healthy diets.

## 2023-12-30 NOTE — Assessment & Plan Note (Addendum)
 Hyperlipidemia managed with Crestor  10 mg. LDL 87 in January, higher than desired. - Continue Crestor  10 mg daily. - Provided dietary guidelines focusing on reducing starchy, sugary foods, and animal fats. - Recheck cholesterol levels at next primary care visit. Consider increasing Crestor  dose if LDL remains elevated.

## 2023-12-30 NOTE — Assessment & Plan Note (Addendum)
 Minimal symptoms with no signs of edema or PND, orthopnea. She is on losartan  100 mg daily along with Toprol  37.5 mg daily.  No diuretic requirement .-Continue current meds.

## 2023-12-30 NOTE — Assessment & Plan Note (Signed)
 CHA2DS2-VASc score is 5.  No bleeding issues. Will currently provide samples for Eliquis  and ensure that her prescription goes to [facility] which is now able to get her the medications at a favorable cost.  .  Given her relatively low A-fib burden, should be fine to hold Eliquis  2 to 3 days preop or surgical procedures per protocol.

## 2023-12-30 NOTE — Assessment & Plan Note (Signed)
 Minimal disease noted on Coronary CTA with negative stress PET.  I doubt very seriously that there is evidence of microvascular disease that was potentially suggested with a coronary intermediate risk test.  Definitely no aspirin as she is now on Eliquis . -Continue Toprol  37.5 mg daily along with losartan  100 mg daily and Crestor  10 mg

## 2024-01-07 DIAGNOSIS — R002 Palpitations: Secondary | ICD-10-CM

## 2024-01-08 DIAGNOSIS — H40023 Open angle with borderline findings, high risk, bilateral: Secondary | ICD-10-CM | POA: Diagnosis not present

## 2024-01-08 DIAGNOSIS — H04123 Dry eye syndrome of bilateral lacrimal glands: Secondary | ICD-10-CM | POA: Diagnosis not present

## 2024-01-08 DIAGNOSIS — H43813 Vitreous degeneration, bilateral: Secondary | ICD-10-CM | POA: Diagnosis not present

## 2024-01-08 DIAGNOSIS — H59813 Chorioretinal scars after surgery for detachment, bilateral: Secondary | ICD-10-CM | POA: Diagnosis not present

## 2024-01-08 DIAGNOSIS — H524 Presbyopia: Secondary | ICD-10-CM | POA: Diagnosis not present

## 2024-01-10 ENCOUNTER — Encounter: Payer: Self-pay | Admitting: Family Medicine

## 2024-01-10 ENCOUNTER — Other Ambulatory Visit: Payer: Self-pay

## 2024-01-10 ENCOUNTER — Ambulatory Visit (INDEPENDENT_AMBULATORY_CARE_PROVIDER_SITE_OTHER): Admitting: Family Medicine

## 2024-01-10 VITALS — BP 140/70 | HR 70 | Temp 98.7°F | Ht 65.0 in | Wt 170.2 lb

## 2024-01-10 DIAGNOSIS — J479 Bronchiectasis, uncomplicated: Secondary | ICD-10-CM

## 2024-01-10 DIAGNOSIS — I5032 Chronic diastolic (congestive) heart failure: Secondary | ICD-10-CM | POA: Diagnosis not present

## 2024-01-10 DIAGNOSIS — I480 Paroxysmal atrial fibrillation: Secondary | ICD-10-CM | POA: Diagnosis not present

## 2024-01-10 DIAGNOSIS — J849 Interstitial pulmonary disease, unspecified: Secondary | ICD-10-CM

## 2024-01-10 DIAGNOSIS — E871 Hypo-osmolality and hyponatremia: Secondary | ICD-10-CM | POA: Diagnosis not present

## 2024-01-10 MED ORDER — LOSARTAN POTASSIUM 100 MG PO TABS: 100.0000 mg | ORAL_TABLET | Freq: Every day | ORAL | 3 refills | Status: AC

## 2024-01-10 MED ORDER — METOPROLOL SUCCINATE ER 25 MG PO TB24: 37.5000 mg | ORAL_TABLET | Freq: Every day | ORAL | 3 refills | Status: AC

## 2024-01-10 NOTE — Assessment & Plan Note (Signed)
 Chronic, encouraged patient to restart Advair 2 inhalations twice daily as well as to use albuterol as needed.

## 2024-01-10 NOTE — Assessment & Plan Note (Signed)
 Stable, chronic.  Euvolemic in office. Continue current medication.   Minimal symptoms with no signs of edema or PND, orthopnea. She is on losartan  100 mg daily along with Toprol  37.5 mg daily.

## 2024-01-10 NOTE — Assessment & Plan Note (Signed)
Followed by pulmonary.  Treated with flutter valve and steroid inhaler.

## 2024-01-10 NOTE — Assessment & Plan Note (Signed)
 Resolved off hydrochlorothiazide .

## 2024-01-10 NOTE — Progress Notes (Signed)
 Patient ID: Patty Shelton, female    DOB: 1940-08-10, 83 y.o.   MRN: [id number]  This visit was conducted in person.  BP (!) 140/70   Pulse 70   Temp 98.7 F (37.1 C) (Temporal)   Ht 5' 5 (1.651 m)   Wt 170 lb 4 oz (77.2 kg)   SpO2 94%   BMI 28.33 kg/m    CC:  Chief Complaint  Patient presents with   Medical Management of Chronic Issues    Follow up Multiple Issues    Subjective:   HPI: Patty Shelton is a 83 y.o. female presenting on 01/10/2024 for  review of chronic health problems. He/She also has the following acute concerns today: None   Atrial fibrillation, proxsysmal: on eliquis  anticoagulation,  toprol  Xl rate control Followed per Dr. Anner.  Hypertension:   Well controlled  on current meds. losartan  BP Readings from Last 3 Encounters:  01/10/24 (!) 140/70  12/28/23 130/60  11/09/23 (!) 159/57  Using medication without problems or lightheadedness:  none Edema:none  No chest pain Short of breath: yes, mild Average home BPs: 140/70 Other issues:  Interstitial lung disease/ bronchiectasis: Followed by pulmonary.    Advair  BID.  Rarely needing albuterol  prn.   Wt Readings from Last 3 Encounters:  01/10/24 170 lb 4 oz (77.2 kg)  12/28/23 167 lb (75.8 kg)  11/09/23 173 lb (78.5 kg)  Body mass index is 28.33 kg/m.    Relevant past medical, surgical, family and social history reviewed and updated as indicated. Interim medical history since our last visit reviewed. Allergies and medications reviewed and updated. Outpatient Medications Prior to Visit  Medication Sig Dispense Refill   acetaminophen  (TYLENOL ) 500 MG tablet Take 500 mg by mouth every 6 (six) hours as needed.     albuterol  (VENTOLIN  HFA) 108 (90 Base) MCG/ACT inhaler Inhale 2 puffs into the lungs every 4 (four) hours as needed for wheezing or shortness of breath. 8 g 10   apixaban  (ELIQUIS ) 5 MG TABS tablet Take 5 mg by mouth 2 (two) times daily.     Biotin  10 MG TABS Take 1 tablet by mouth  daily.     cetirizine (ZYRTEC) 10 MG tablet Take 10 mg by mouth daily as needed for allergies.     Cholecalciferol  (VITAMIN D3) 50 MCG (2000 UT) TABS Take 1 tablet by mouth daily.     CRANBERRY PO Take 1 capsule by mouth daily.     diclofenac  sodium (VOLTAREN ) 1 % GEL Apply 4 g topically 4 (four) times daily as needed.     famciclovir  (FAMVIR ) 250 MG tablet TAKE 1 TABLET DAILY 90 tablet 3   fluticasone  (FLONASE ) 50 MCG/ACT nasal spray Place 2 sprays into both nostrils as needed.      fluticasone -salmeterol (ADVAIR  HFA) 115-21 MCG/ACT inhaler USE 2 INHALATIONS TWICE A DAY 3 each 2   meloxicam  (MOBIC ) 15 MG tablet Take 15 mg by mouth daily.     methocarbamol  (ROBAXIN ) 500 MG tablet Take 500 mg by mouth 3 (three) times daily.     metoprolol  succinate (TOPROL  XL) 25 MG 24 hr tablet Take 1.5 tablets (37.5 mg total) by mouth daily. 180 tablet 3   Multiple Minerals-Vitamins (CAL-MAG-ZINC-D PO) Take 1 tablet by mouth daily.     Respiratory Therapy Supplies (FLUTTER) DEVI 1 Device by Does not apply route as directed. 1 each 0   rosuvastatin  (CRESTOR ) 10 MG tablet TAKE 1 TABLET DAILY 90 tablet 3   vitamin B-12 (  CYANOCOBALAMIN ) 1000 MCG tablet Take 1,000 mcg by mouth daily.     vitamin E  180 MG (400 UNITS) capsule Take 400 Units by mouth daily.     apixaban  (ELIQUIS ) 5 MG TABS tablet Take 1 tablet (5 mg total) by mouth 2 (two) times daily. 180 tablet 3   apixaban  (ELIQUIS ) 5 MG TABS tablet Take 1 tablet (5 mg total) by mouth 2 (two) times daily.     losartan  (COZAAR ) 100 MG tablet TAKE 1 TABLET DAILY (Patient not taking: Reported on 01/10/2024) 90 tablet 0   No facility-administered medications prior to visit.     Per HPI unless specifically indicated in ROS section below Review of Systems  Constitutional:  Negative for fatigue and fever.  HENT:  Negative for congestion.   Eyes:  Negative for pain.  Respiratory:  Negative for cough and shortness of breath.   Cardiovascular:  Negative for chest pain,  palpitations and leg swelling.  Gastrointestinal:  Negative for abdominal pain and blood in stool.  Genitourinary:  Negative for dysuria and vaginal bleeding.  Musculoskeletal:  Negative for back pain.  Neurological:  Negative for syncope, light-headedness and headaches.  Psychiatric/Behavioral:  Negative for dysphoric mood.    Objective:  BP (!) 140/70   Pulse 70   Temp 98.7 F (37.1 C) (Temporal)   Ht 5' 5 (1.651 m)   Wt 170 lb 4 oz (77.2 kg)   SpO2 94%   BMI 28.33 kg/m   Wt Readings from Last 3 Encounters:  01/10/24 170 lb 4 oz (77.2 kg)  12/28/23 167 lb (75.8 kg)  11/09/23 173 lb (78.5 kg)      Physical Exam Constitutional:      General: She is not in acute distress.    Appearance: Normal appearance. She is well-developed. She is not ill-appearing or toxic-appearing.  HENT:     Head: Normocephalic.     Right Ear: Hearing, tympanic membrane, ear canal and external ear normal. Tympanic membrane is not erythematous, retracted or bulging.     Left Ear: Hearing, tympanic membrane, ear canal and external ear normal. Tympanic membrane is not erythematous, retracted or bulging.     Nose: No mucosal edema or rhinorrhea.     Right Sinus: No maxillary sinus tenderness or frontal sinus tenderness.     Left Sinus: No maxillary sinus tenderness or frontal sinus tenderness.     Mouth/Throat:     Pharynx: Uvula midline.  Eyes:     General: Lids are normal. Lids are everted, no foreign bodies appreciated.     Conjunctiva/sclera: Conjunctivae normal.     Pupils: Pupils are equal, round, and reactive to light.  Neck:     Thyroid : No thyroid  mass or thyromegaly.     Vascular: No carotid bruit.     Trachea: Trachea normal.  Cardiovascular:     Rate and Rhythm: Normal rate and regular rhythm.     Pulses: Normal pulses.     Heart sounds: Normal heart sounds, S1 normal and S2 normal. No murmur heard.    No friction rub. No gallop.  Pulmonary:     Effort: Pulmonary effort is normal. No  tachypnea or respiratory distress.     Breath sounds: Normal breath sounds. No decreased breath sounds, wheezing, rhonchi or rales.  Abdominal:     General: Bowel sounds are normal.     Palpations: Abdomen is soft.     Tenderness: There is no abdominal tenderness.  Musculoskeletal:     Cervical back: Normal range  of motion and neck supple.  Skin:    General: Skin is warm and dry.     Findings: No rash.  Neurological:     Mental Status: She is alert.  Psychiatric:        Mood and Affect: Mood is not anxious or depressed.        Speech: Speech normal.        Behavior: Behavior normal. Behavior is cooperative.        Thought Content: Thought content normal.        Judgment: Judgment normal.       Results for orders placed or performed during the hospital encounter of 11/09/23  NM PET CT CARDIAC PERFUSION MULTI W/ABSOLUTE BLOODFLOW   Collection Time: 11/09/23  3:25 PM  Result Value Ref Range   Rest Nuclear Isotope Dose 20.5 mCi   Stress Nuclear Isotope Dose 20.6 mCi   Rest HR 67.0 bpm   Rest BP 164/83 mmHg   Peak HR 82 bpm   Peak BP 158/54 mmHg   SSS 0.0    SRS 0.0    TID 0.96    Nuc Stress EF 66 %   Nuc Rest EF 60 %   ST Depression (mm) 0 mm   Rest MBF 1.11 ml/g/min   Stress MBF 2.06 ml/g/min   MBFR 1.86      COVID 19 screen:  No recent travel or known exposure to COVID19 The patient denies respiratory symptoms of COVID 19 at this time. The importance of social distancing was discussed today.   Assessment and Plan   Problem List Items Addressed This Visit     Bronchiectasis without complication (HCC)   Followed by pulmonary.  Treated with flutter valve and steroid inhaler.      Chronic diastolic CHF (congestive heart failure) (HCC) (Chronic)   Stable, chronic.  Euvolemic in office. Continue current medication.   Minimal symptoms with no signs of edema or PND, orthopnea. She is on losartan  100 mg daily along with Toprol  37.5 mg daily.        Relevant  Medications   apixaban  (ELIQUIS ) 5 MG TABS tablet   losartan  (COZAAR ) 100 MG tablet   Hyponatremia   Resolved off hydrochlorothiazide .      ILD (interstitial lung disease) (HCC)   Chronic, encouraged patient to restart Advair  2 inhalations twice daily as well as to use albuterol  as needed.      Paroxysmal atrial fibrillation (HCC) - Primary (Chronic)    Chronic, reviewed Cardiology recent note  Continue Toprol  37.5 mg for rate control, and Eliquis  5 mg twice daily      Relevant Medications   apixaban  (ELIQUIS ) 5 MG TABS tablet   losartan  (COZAAR ) 100 MG tablet    Return in about 25 weeks (around 07/03/2024).     Greig Ring, MD

## 2024-01-10 NOTE — Assessment & Plan Note (Signed)
 Chronic, reviewed Cardiology recent note  Continue Toprol  37.5 mg for rate control, and Eliquis  5 mg twice daily

## 2024-01-23 DIAGNOSIS — M7062 Trochanteric bursitis, left hip: Secondary | ICD-10-CM | POA: Diagnosis not present

## 2024-04-12 ENCOUNTER — Ambulatory Visit: Admitting: Family

## 2024-04-12 ENCOUNTER — Encounter: Payer: Self-pay | Admitting: Family

## 2024-04-12 VITALS — BP 130/64 | HR 84 | Temp 98.0°F | Ht 65.0 in | Wt 172.4 lb

## 2024-04-12 DIAGNOSIS — R051 Acute cough: Secondary | ICD-10-CM

## 2024-04-12 DIAGNOSIS — J069 Acute upper respiratory infection, unspecified: Secondary | ICD-10-CM

## 2024-04-12 LAB — POC COVID19 BINAXNOW: SARS Coronavirus 2 Ag: NEGATIVE

## 2024-04-12 MED ORDER — BENZONATATE 100 MG PO CAPS: 100.0000 mg | ORAL_CAPSULE | Freq: Three times a day (TID) | ORAL | 0 refills | Status: AC | PRN

## 2024-04-12 NOTE — Progress Notes (Signed)
 Acute Office Visit  Subjective:     Patient ID: Patty Shelton, female    DOB: [birth year], 83 y.o.   MRN: [id number]  Chief Complaint  Patient presents with  . Cough    C/o cough, runny nose and fatigue. Sxs started 04/09/24.    HPI Patient is in today with c/o cough, congestion, sore throat x 4 days that is beginning to improve with Zyrtec. She denies any fever. No know sick contacts.   Review of Systems  Constitutional:  Negative for chills and fever.  HENT:  Positive for congestion and sore throat.   Respiratory:  Positive for cough and sputum production. Negative for shortness of breath.        Yellow/green sputum  Musculoskeletal: Negative.   Neurological: Negative.   Endo/Heme/Allergies: Negative.   Psychiatric/Behavioral: Negative.    All other systems reviewed and are negative.   Past Medical History:  Diagnosis Date  . Actinic keratosis   . Agatston CAC score, <100 05/04/2021   Coronary CTA : Coronary Calcium  Score 27.  Minimal proximal (dominant) RCA calcified plaque (0-24%) with no plaque noted in the LAD and nondominant LCx.  SABRA Hyperlipidemia with target LDL less than 100 03/27/2009   Qualifier: Diagnosis of  By: Avelina MD, Amy    . Hypertension   . Paroxysmal atrial fibrillation (HCC) 10/2023   1% burden-longest episode roughly 1 hour on Zio patch monitor    Social History   Socioeconomic History  . Marital status: Married    Spouse name: Not on file  . Number of children: Not on file  . Years of education: Not on file  . Highest education level: 12th grade  Occupational History  . Not on file  Tobacco Use  . Smoking status: Never  . Smokeless tobacco: Never  Vaping Use  . Vaping status: Never Used  Substance and Sexual Activity  . Alcohol use: No    Alcohol/week: 0.0 standard drinks of alcohol  . Drug use: No  . Sexual activity: Yes  Other Topics Concern  . Not on file  Social History Narrative   No living will , no HCPOA. Full code  (reviewed 2015, given packet)   Social Drivers of Health   Financial Resource Strain: Low Risk  (10/12/2023)   Overall Financial Resource Strain (CARDIA)   . Difficulty of Paying Living Expenses: Not hard at all  Food Insecurity: No Food Insecurity (10/12/2023)   Hunger Vital Sign   . Worried About Programme Researcher, Broadcasting/film/video in the Last Year: Never true   . Ran Out of Food in the Last Year: Never true  Transportation Needs: No Transportation Needs (10/12/2023)   PRAPARE - Transportation   . Lack of Transportation (Medical): No   . Lack of Transportation (Non-Medical): No  Physical Activity: Inactive (10/12/2023)   Exercise Vital Sign   . Days of Exercise per Week: 0 days   . Minutes of Exercise per Session: 0 min  Stress: No Stress Concern Present (10/12/2023)   [employer] of Occupational Health - Occupational Stress Questionnaire   . Feeling of Stress : Not at all  Social Connections: Moderately Integrated (10/12/2023)   Social Connection and Isolation Panel   . Frequency of Communication with Friends and Family: More than three times a week   . Frequency of Social Gatherings with Friends and Family: More than three times a week   . Attends Religious Services: More than 4 times per year   . Active Member of  Clubs or Organizations: No   . Attends Banker Meetings: Never   . Marital Status: Married  Catering Manager Violence: Not At Risk (10/12/2023)   Humiliation, Afraid, Rape, and Kick questionnaire   . Fear of Current or Ex-Partner: No   . Emotionally Abused: No   . Physically Abused: No   . Sexually Abused: No    Past Surgical History:  Procedure Laterality Date  . CATARACT EXTRACTION    . CT CTA CORONARY W/CA SCORE W/CM &/OR WO/CM  05/04/2021   Coronary Calcium  Score 27.  Minimal proximal (dominant) RCA calcified plaque (0-24%) with no plaque noted in the LAD and nondominant LCx.  Normal PA size.mild aortic root and ascending aortic calcification-evaluation for  dissection. (No significant extracardiac findings),  . RETINAL DETACHMENT SURGERY    . TRANSTHORACIC ECHOCARDIOGRAM  05/27/2021   [facility]: (Normal Study) EF 55 to 60%.  No R WMA.  GR 1 DD with mild LA dilation.  Normal RV size and function-normal RVP and RAP.  Normal valves.    Family History  Problem Relation Age of Onset  . Heart attack Mother 68       Apparently she started having issues at 39, but not sure if she had a heart attack at that time  . Coronary artery disease Mother 60  . Stroke Father   . Hyperlipidemia Father   . Dementia Father   . Breast cancer Neg Hx     Allergies  Allergen Reactions  . Atorvastatin  Calcium  Other (See Comments)    atorvastatin  calcium   . Amlodipine  Besylate Swelling  . Lipitor [Atorvastatin ] Other (See Comments)    Aches at 40mg  dose Leg cramps   . Pollen Extract Cough    Current Outpatient Medications on File Prior to Visit  Medication Sig Dispense Refill  . acetaminophen  (TYLENOL ) 500 MG tablet Take 500 mg by mouth every 6 (six) hours as needed.    . albuterol  (VENTOLIN  HFA) 108 (90 Base) MCG/ACT inhaler Inhale 2 puffs into the lungs every 4 (four) hours as needed for wheezing or shortness of breath. 8 g 10  . apixaban  (ELIQUIS ) 5 MG TABS tablet Take 5 mg by mouth 2 (two) times daily.    . Biotin  10 MG TABS Take 1 tablet by mouth daily.    . cetirizine (ZYRTEC) 10 MG tablet Take 10 mg by mouth daily as needed for allergies.    . Cholecalciferol  (VITAMIN D3) 50 MCG (2000 UT) TABS Take 1 tablet by mouth daily.    SABRA CRANBERRY PO Take 1 capsule by mouth daily.    . diclofenac  sodium (VOLTAREN ) 1 % GEL Apply 4 g topically 4 (four) times daily as needed.    . famciclovir  (FAMVIR ) 250 MG tablet TAKE 1 TABLET DAILY 90 tablet 3  . fluticasone  (FLONASE ) 50 MCG/ACT nasal spray Place 2 sprays into both nostrils as needed.     . fluticasone -salmeterol (ADVAIR  HFA) 115-21 MCG/ACT inhaler USE 2 INHALATIONS TWICE A DAY 3 each 2  . losartan  (COZAAR ) 100 MG  tablet Take 1 tablet (100 mg total) by mouth daily. 90 tablet 3  . meloxicam  (MOBIC ) 15 MG tablet Take 15 mg by mouth daily.    . methocarbamol  (ROBAXIN ) 500 MG tablet Take 500 mg by mouth 3 (three) times daily.    . metoprolol  succinate (TOPROL  XL) 25 MG 24 hr tablet Take 1.5 tablets (37.5 mg total) by mouth daily. 135 tablet 3  . Multiple Minerals-Vitamins (CAL-MAG-ZINC-D PO) Take 1 tablet by mouth  daily.    . Respiratory Therapy Supplies (FLUTTER) DEVI 1 Device by Does not apply route as directed. 1 each 0  . rosuvastatin  (CRESTOR ) 10 MG tablet TAKE 1 TABLET DAILY 90 tablet 3  . vitamin B-12 (CYANOCOBALAMIN ) 1000 MCG tablet Take 1,000 mcg by mouth daily.    . vitamin E  180 MG (400 UNITS) capsule Take 400 Units by mouth daily.     No current facility-administered medications on file prior to visit.    BP 130/64   Pulse 84   Temp 98 F (36.7 C) (Oral)   Ht 5' 5 (1.651 m)   Wt 172 lb 6 oz (78.2 kg)   SpO2 94%   BMI 28.68 kg/m chart     Objective:    BP 130/64   Pulse 84   Temp 98 F (36.7 C) (Oral)   Ht 5' 5 (1.651 m)   Wt 172 lb 6 oz (78.2 kg)   SpO2 94%   BMI 28.68 kg/m    Physical Exam Vitals and nursing note reviewed.  Constitutional:      Appearance: Normal appearance. She is normal weight.  HENT:     Head: Normocephalic and atraumatic.     Right Ear: Tympanic membrane, ear canal and external ear normal.     Left Ear: Tympanic membrane, ear canal and external ear normal.     Nose: Nose normal.     Mouth/Throat:     Mouth: Mucous membranes are moist.     Pharynx: Oropharynx is clear.  Eyes:     Conjunctiva/sclera: Conjunctivae normal.  Cardiovascular:     Rate and Rhythm: Normal rate and regular rhythm.  Pulmonary:     Effort: Pulmonary effort is normal.     Breath sounds: Normal breath sounds. No wheezing or rales.  Musculoskeletal:        General: Normal range of motion.  Skin:    General: Skin is warm and dry.  Neurological:     General: No focal  deficit present.     Mental Status: She is alert and oriented to person, place, and time. Mental status is at baseline.  Psychiatric:        Mood and Affect: Mood normal.        Behavior: Behavior normal.    No results found for any visits on 04/12/24.      Assessment & Plan:   Problem List Items Addressed This Visit   None Visit Diagnoses       Acute cough    -  Primary   Relevant Orders   POC COVID-19 BinaxNow     Viral upper respiratory infection           Meds ordered this encounter  Medications  . benzonatate  (TESSALON  PERLES) 100 MG capsule    Sig: Take 1 capsule (100 mg total) by mouth 3 (three) times daily as needed for cough.    Dispense:  20 capsule    Refill:  0   Continue Zyrtec. Call the office if symptoms worsen or persist. Recheck as scheduled and sooner as needed.  No follow-ups on file.  Gokul Waybright B Kinleigh Nault, FNP

## 2024-04-16 ENCOUNTER — Ambulatory Visit

## 2024-04-22 ENCOUNTER — Ambulatory Visit

## 2024-04-22 DIAGNOSIS — W908XXA Exposure to other nonionizing radiation, initial encounter: Secondary | ICD-10-CM | POA: Diagnosis not present

## 2024-04-22 DIAGNOSIS — L814 Other melanin hyperpigmentation: Secondary | ICD-10-CM

## 2024-04-22 DIAGNOSIS — Z1283 Encounter for screening for malignant neoplasm of skin: Secondary | ICD-10-CM | POA: Diagnosis not present

## 2024-04-22 DIAGNOSIS — Z872 Personal history of diseases of the skin and subcutaneous tissue: Secondary | ICD-10-CM | POA: Diagnosis not present

## 2024-04-22 DIAGNOSIS — D1801 Hemangioma of skin and subcutaneous tissue: Secondary | ICD-10-CM

## 2024-04-22 DIAGNOSIS — L820 Inflamed seborrheic keratosis: Secondary | ICD-10-CM

## 2024-04-22 DIAGNOSIS — L578 Other skin changes due to chronic exposure to nonionizing radiation: Secondary | ICD-10-CM | POA: Diagnosis not present

## 2024-04-22 DIAGNOSIS — L821 Other seborrheic keratosis: Secondary | ICD-10-CM

## 2024-04-22 DIAGNOSIS — D229 Melanocytic nevi, unspecified: Secondary | ICD-10-CM

## 2024-04-22 NOTE — Progress Notes (Signed)
 Subjective   Patty Shelton is a 83 y.o. female who presents for the following: Lesion(s) of concern . Patient is established patient   Today patient reports: Area of concern on the face Area of concern on the neck Area of concern on right forearm   Review of Systems:    No other skin or systemic complaints except as noted in HPI or Assessment and Plan.  The following portions of the chart were reviewed this encounter and updated as appropriate: medications, allergies, medical history  Relevant Medical History:  Personal history of actinic keratosis   Objective  (SKPE) Well appearing patient in no apparent distress; mood and affect are within normal limits. Examination was performed of the: Sun Exposed Exam: Scalp, head, eyes, ears, nose, lips, neck, upper extremities, hands, fingers, fingernails  Examination notable for: SKIN EXAM, Angioma(s): Scattered red vascular papule(s)  , Lentigo/lentigines: Scattered pigmented macules that are tan to brown in color and are somewhat non-uniform in shape and concentrated in the sun-exposed areas, Nevus/nevi: Scattered well-demarcated, regular, pigmented macule(s) and/or papule(s)  , Seborrheic Keratosis(es): Stuck-on appearing keratotic papule(s) on the trunk, some  irritated with redness, crusting, edema, and/or partial avulsion, Actinic Damage/Elastosis: chronic sun damage: dyspigmentation, telangiectasia, and wrinkling  Examination limited by: Clothing and Patient deferred removal     Neck Pink scaly macules  Assessment & Plan  (SKAP)   SKIN CANCER SCREENING PERFORMED TODAY.  BENIGN SKIN FINDINGS  - Lentigines  - Seborrheic keratoses  - Hemangiomas   - Nevus/Multiple Benign Nevi - Reassurance provided regarding the benign appearance of lesions noted on exam today; no treatment is indicated in the absence of symptoms/changes. - Reinforced importance of photoprotective strategies including liberal and frequent sunscreen use of a  broad-spectrum SPF 30 or greater, use of protective clothing, and sun avoidance for prevention of cutaneous malignancy and photoaging.  Counseled patient on the importance of regular self-skin monitoring as well as routine clinical skin examinations as scheduled.   ACTINIC DAMAGE - Chronic condition, secondary to cumulative UV/sun exposure - Recommend daily broad spectrum sunscreen SPF 30+ to sun-exposed areas, reapply every 2 hours as needed.  - Staying in the shade or wearing long sleeves, sun glasses (UVA+UVB protection) and wide brim hats (4-inch brim around the entire circumference of the hat) are also recommended for sun protection.  - Call for new or changing lesions.  Personal history of actinic keratosis  - Reviewed medical history for full details  - Reviewed sun protective measures as above - Encouraged full body skin exams    Level of service outlined above   Patient instructions (SKPI)   Procedures, orders, diagnosis for this visit:  INFLAMED SEBORRHEIC KERATOSIS Neck Actinic keratoses are precancerous spots that appear secondary to cumulative UV radiation exposure/sun exposure over time. They are chronic with expected duration over 1 year. A portion of actinic keratoses will progress to squamous cell carcinoma of the skin. It is not possible to reliably predict which spots will progress to skin cancer and so treatment is recommended to prevent development of skin cancer.  Recommend daily broad spectrum sunscreen SPF 30+ to sun-exposed areas, reapply every 2 hours as needed.  Recommend staying in the shade or wearing long sleeves, sun glasses (UVA+UVB protection) and wide brim hats (4-inch brim around the entire circumference of the hat). Call for new or changing lesions. Destruction of lesion - Neck Complexity: simple   Destruction method: cryotherapy   Informed consent: discussed and consent obtained   Timeout:  patient name, date of birth, surgical site, and procedure  verified Lesion destroyed using liquid nitrogen: Yes   Region frozen until ice ball extended beyond lesion: Yes   Cryo cycles: 1 or 2. Outcome: patient tolerated procedure well with no complications   Post-procedure details: wound care instructions given     Inflamed seborrheic keratosis -     Destruction of lesion    Return to clinic: Return for Appointment as scheduled.  I, Emerick Ege, CMA am acting as scribe for Lauraine JAYSON Kanaris, MD.   Documentation: I have reviewed the above documentation for accuracy and completeness, and I agree with the above.  Lauraine JAYSON Kanaris, MD

## 2024-04-22 NOTE — Patient Instructions (Addendum)
 Moisturizer: Apply a moisturizer throughout the day and after bathing.  When you moisturize after bathing, this locks in the moisture.  This can lead to softer and smoother skin.  Body moisturizers come in ointments, creams, and lotions.  If you have dry skin, we recommend the use of ointments or creams rather than lotions.  In other words, something you scoop out of a jar rather than squirted out.  Ointments and creams are thicker and thus provide better moisturization.      Moisturizers Apply a moisturizer to your skin at least once a day (even if you do not bathe).   Cool moisturizers on your skin help with itching (to accomplish this, place your moisturizers and medicated creams in the refrigerator). In general, people with dry skin need a moisturizer that is scooped and not squirted.  - Ointments (petrolatum ointment): greasy, but are the best moisturizers Vaseline, Aquaphor  - Creams (thick, white cream that comes in a jar and is scooped with your hand) Cerave, Cetaphil, Eucerin, Vanicream  - Lotions (comes in a pump) is the weakest moisturizer but is an acceptable choice for the face if you have an oily face Cetaphil, Cerave, Curel, Neutrogena, Lubriderm, Aveeno     Due to recent changes in healthcare laws, you may see results of your pathology and/or laboratory studies on MyChart before the doctors have had a chance to review them. We understand that in some cases there may be results that are confusing or concerning to you. Please understand that not all results are received at the same time and often the doctors may need to interpret multiple results in order to provide you with the best plan of care or course of treatment. Therefore, we ask that you please give us  2 business days to thoroughly review all your results before contacting the office for clarification. Should we see a critical lab result, you will be contacted sooner.   If You Need Anything After Your Visit  If  you have any questions or concerns for your doctor, please call our main line at [phone] and press option 4 to reach your doctor's medical assistant. If no one answers, please leave a voicemail as directed and we will return your call as soon as possible. Messages left after 4 pm will be answered the following business day.   You may also send us  a message via MyChart. We typically respond to MyChart messages within 1-2 business days.  For prescription refills, please ask your pharmacy to contact our office. Our fax number is [phone].  If you have an urgent issue when the clinic is closed that cannot wait until the next business day, you can page your doctor at the number below.    Please note that while we do our best to be available for urgent issues outside of office hours, we are not available 24/7.   If you have an urgent issue and are unable to reach us , you may choose to seek medical care at your doctor's office, retail clinic, urgent care center, or emergency room.  If you have a medical emergency, please immediately call 911 or go to the emergency department.  Pager Numbers  - Dr. Hester: [phone]  - Dr. Jackquline: [phone]  - Dr. Claudene: [phone]   - Dr. Raymund: [phone]  In the event of inclement weather, please call our main line at [phone] for an update on the status of any delays or closures.  Dermatology Medication Tips: Please keep the boxes  that topical medications come in in order to help keep track of the instructions about where and how to use these. Pharmacies typically print the medication instructions only on the boxes and not directly on the medication tubes.   If your medication is too expensive, please contact our office at [phone] option 4 or send us  a message through MyChart.   We are unable to tell what your co-pay for medications will be in advance as this is different depending on your insurance coverage. However, we may  be able to find a substitute medication at lower cost or fill out paperwork to get insurance to cover a needed medication.   If a prior authorization is required to get your medication covered by your insurance company, please allow us  1-2 business days to complete this process.  Drug prices often vary depending on where the prescription is filled and some pharmacies may offer cheaper prices.  The website www.goodrx.com contains coupons for medications through different pharmacies. The prices here do not account for what the cost may be with help from insurance (it may be cheaper with your insurance), but the website can give you the price if you did not use any insurance.  - You can print the associated coupon and take it with your prescription to the pharmacy.  - You may also stop by our office during regular business hours and pick up a GoodRx coupon card.  - If you need your prescription sent electronically to a different pharmacy, notify our office through [facility] or by phone at [phone] option 4.     Si Usted Necesita Algo Despus de Su Visita  Tambin puede enviarnos un mensaje a travs de Clinical cytogeneticist. Por lo general respondemos a los mensajes de MyChart en el transcurso de 1 a 2 das hbiles.  Para renovar recetas, por favor pida a su farmacia que se ponga en contacto con nuestra oficina. Randi lakes de fax es [place] [phone].  Si tiene un asunto urgente cuando la clnica est cerrada y que no puede esperar hasta el siguiente da hbil, puede llamar/localizar a su doctor(a) al nmero que aparece a continuacin.   Por favor, tenga en cuenta que aunque hacemos todo lo posible para estar disponibles para asuntos urgentes fuera del horario de [place], no estamos disponibles las 24 horas del da, los 7 [address]  [address] de la [place].   Si tiene un problema urgente y no puede comunicarse con nosotros, puede optar por buscar atencin mdica  en el consultorio de su doctor(a), en una  clnica privada, en un centro de atencin urgente o en una sala de emergencias.  Si tiene Engineer, drilling, por favor llame inmediatamente al 911 o vaya a la sala de emergencias.  Nmeros de bper  - Dr. Hester: [phone]  - Dra. Jackquline: [phone]  - Dr. Claudene: [phone]  - Dra. Kitts: [phone]  En caso de inclemencias del [place], por favor llame a nuestra lnea principal al [phone] para una actualizacin sobre el estado de cualquier retraso o cierre.  Consejos para la medicacin en dermatologa: Por favor, guarde las cajas en las que vienen los medicamentos de uso tpico para ayudarle a seguir las instrucciones sobre dnde y cmo usarlos. Las farmacias generalmente imprimen las instrucciones del medicamento slo en las cajas y no directamente en los tubos del [place].   Si su medicamento es muy caro, por favor, pngase en contacto con landry rieger llamando al [phone] y presione la opcin 4 o envenos un mensaje a  travs de MyChart.   No podemos decirle cul ser su copago por los medicamentos por adelantado ya que esto es diferente dependiendo de la cobertura de su seguro. Sin embargo, es posible que podamos encontrar un medicamento sustituto a Audiological scientist un formulario para que el seguro cubra el medicamento que se considera necesario.   Si se requiere una autorizacin previa para que su compaa de seguros malta su medicamento, por favor permtanos de 1 a 2 das hbiles para completar este proceso.  Los precios de los medicamentos varan con frecuencia dependiendo del Environmental consultant de dnde se surte la receta y alguna farmacias pueden ofrecer precios ms baratos.  El sitio web www.goodrx.com tiene cupones para medicamentos de Health and safety inspector. Los precios aqu no tienen en cuenta lo que podra costar con la ayuda del seguro (puede ser ms barato con su seguro), pero el sitio web puede darle el precio si no utiliz Tourist information centre manager.  - Puede  imprimir el cupn correspondiente y llevarlo con su receta a la farmacia.  - Tambin puede pasar por nuestra oficina durante el horario de atencin regular y Education officer, museum una tarjeta de cupones de GoodRx.  - Si necesita que su receta se enve electrnicamente a una farmacia diferente, informe a nuestra oficina a travs de MyChart de [place] o por telfono llamando al [phone] y presione la opcin 4.

## 2024-06-14 ENCOUNTER — Telehealth: Payer: Self-pay | Admitting: *Deleted

## 2024-06-14 DIAGNOSIS — E785 Hyperlipidemia, unspecified: Secondary | ICD-10-CM

## 2024-06-14 DIAGNOSIS — M109 Gout, unspecified: Secondary | ICD-10-CM

## 2024-06-14 NOTE — Telephone Encounter (Signed)
-----   Message from Veva JINNY Ferrari sent at 06/14/2024 11:51 AM EST ----- Regarding: Lab orders for Wed. 1.14.26 Patient is scheduled for CPX labs, please order future labs, Thanks , Veva

## 2024-06-14 NOTE — Addendum Note (Signed)
 Addended by: HOPE VEVA PARAS on: 06/14/2024 03:17 PM   Modules accepted: Orders

## 2024-06-26 ENCOUNTER — Ambulatory Visit: Payer: Self-pay | Admitting: Family Medicine

## 2024-06-26 ENCOUNTER — Other Ambulatory Visit (INDEPENDENT_AMBULATORY_CARE_PROVIDER_SITE_OTHER): Payer: Medicare Other

## 2024-06-26 DIAGNOSIS — E785 Hyperlipidemia, unspecified: Secondary | ICD-10-CM

## 2024-06-26 DIAGNOSIS — M109 Gout, unspecified: Secondary | ICD-10-CM

## 2024-06-26 LAB — URIC ACID: Uric Acid, Serum: 6 mg/dL (ref 2.4–7.0)

## 2024-06-26 LAB — COMPREHENSIVE METABOLIC PANEL WITH GFR
ALT: 11 U/L (ref 3–35)
AST: 15 U/L (ref 5–37)
Albumin: 4 g/dL (ref 3.5–5.2)
Alkaline Phosphatase: 60 U/L (ref 39–117)
BUN: 13 mg/dL (ref 6–23)
CO2: 27 meq/L (ref 19–32)
Calcium: 8.7 mg/dL (ref 8.4–10.5)
Chloride: 101 meq/L (ref 96–112)
Creatinine, Ser: 1.1 mg/dL (ref 0.40–1.20)
GFR: 46.28 mL/min — ABNORMAL LOW
Glucose, Bld: 117 mg/dL — ABNORMAL HIGH (ref 70–99)
Potassium: 4.2 meq/L (ref 3.5–5.1)
Sodium: 135 meq/L (ref 135–145)
Total Bilirubin: 0.5 mg/dL (ref 0.2–1.2)
Total Protein: 6.1 g/dL (ref 6.0–8.3)

## 2024-06-26 LAB — LIPID PANEL
Cholesterol: 157 mg/dL (ref 28–200)
HDL: 52.2 mg/dL
LDL Cholesterol: 90 mg/dL (ref 10–99)
NonHDL: 104.86
Total CHOL/HDL Ratio: 3
Triglycerides: 76 mg/dL (ref 10.0–149.0)
VLDL: 15.2 mg/dL (ref 0.0–40.0)

## 2024-06-26 NOTE — Progress Notes (Signed)
 No critical labs need to be addressed urgently. We will discuss labs in detail at upcoming office visit.

## 2024-07-02 ENCOUNTER — Ambulatory Visit: Admitting: Medical

## 2024-07-02 NOTE — Progress Notes (Unsigned)
 " Cardiology Office Note   Date:  07/02/2024  ID:  Patty Shelton, Patty Shelton 1940/10/05, MRN [id number] PCP: Avelina Greig BRAVO, MD   [facility] Providers Cardiologist:  Alm Clay, MD { Click to update primary MD,subspecialty MD or APP then REFRESH:1}    History of Present Illness Patty Shelton is a 84 y.o. female  with a past medical history of hypertension, hyperlipidemia, longstanding interstitial lung disease/bronchiectasis, asthma, dysphagia, GERD, hearing loss, osteopenia, CKD stage III who presents for ER follow-up.   Echocardiogram in December 2022 showed EF of 55 to 60%, no wall motion abnormality, grade 1 diastolic dysfunction, mild LA dilation, normal RV size and function.  Coronary CTA in November 2022 showed calcium  score of 20 with minimal proximal dominant RCA calcified plaque at 0 to 24% with no plaque noted in the LAD and nondominant left circumflex.     The patient presented to [facility] 08/01/2023 with chest pain.  She had previously been diagnosed with influenza A.  Troponin was negative x 2.  EKG showed normal sinus rhythm, no significant change.  Chest x-ray no focal consolidation.  She was seen in clinic 08/17/2023 reporting continual chest discomfort for several weeks.  She was started on Toprol  12.5 mg daily to help with palpitations.  Carotid duplex was ordered for carotid bruit.  She was seen back in clinic 09/28/2023 and was overall doing well.   The patient went to the ER 10/13/23 for dizziness and fall. She was watering her plants when she tripped on the steps.she had no LOC. She had no significant injuries. She refised CT scan of the head and neck. She was found to be in new onset Afib with rates up to 102bpm. Sodium was 129 and she was given chloride tablet. She was admitted but hospital did not feel it was Afib so no a/c was started. HS troponin normal. She was unable to get a bed so she was discharged home. While in afib she did not feel heart racing or palpitations. She was  sent home on home dose of metoprolol .  Patient was seen in the office 10/20/2023 and was in normal sinus rhythm.  EKG from the ER confirmed A-fib.  Due to CHA2DS2-VASc of 5 she was started on Eliquis  5 mg twice daily.  Toprol  was increased to 37.5 mg daily.  A 2-week heart monitor showed normal sinus rhythm with an average heart rate of 66 bpm, rare PACs and PVCs, 1% A-fib burden with a total of 22 episodes.  Stress test was ordered for chest pressure.  Myoview Lexiscan  showed no ischemia and no infarction, intermediate risk due to mildly reduced myocardial blood flow reserve which may have represented microvascular dysfunction or balanced ischemia due to multivessel CAD.  Coronary calcium  present on attenuation correction CT images.  ROS: ***  Studies Reviewed      *** Risk Assessment/Calculations {Does this patient have ATRIAL FIBRILLATION?[phone]} No BP recorded.  {Refresh Note OR Click here to enter BP  :1}***       Physical Exam VS:  There were no vitals taken for this visit.       Wt Readings from Last 3 Encounters:  04/12/24 172 lb 6 oz (78.2 kg)  01/10/24 170 lb 4 oz (77.2 kg)  12/28/23 167 lb (75.8 kg)    GEN: Well nourished, well developed in no acute distress NECK: No JVD; No carotid bruits CARDIAC: ***RRR, no murmurs, rubs, gallops RESPIRATORY:  Clear to auscultation without rales, wheezing or rhonchi  ABDOMEN:  Soft, non-tender, non-distended EXTREMITIES:  No edema; No deformity   ASSESSMENT AND PLAN ***    {Are you ordering a CV Procedure (e.g. stress test, cath, DCCV, TEE, etc)?   Press F2        :[id number]}  Dispo: ***  Signed, Noor Vidales VEAR Fishman, PA-C   "

## 2024-07-03 ENCOUNTER — Ambulatory Visit: Payer: Medicare Other | Admitting: Family Medicine

## 2024-07-03 ENCOUNTER — Encounter: Payer: Self-pay | Admitting: Family Medicine

## 2024-07-03 VITALS — BP 132/60 | HR 81 | Temp 99.0°F | Ht 65.5 in | Wt 173.2 lb

## 2024-07-03 DIAGNOSIS — I25118 Atherosclerotic heart disease of native coronary artery with other forms of angina pectoris: Secondary | ICD-10-CM

## 2024-07-03 DIAGNOSIS — J479 Bronchiectasis, uncomplicated: Secondary | ICD-10-CM | POA: Diagnosis not present

## 2024-07-03 DIAGNOSIS — M8580 Other specified disorders of bone density and structure, unspecified site: Secondary | ICD-10-CM

## 2024-07-03 DIAGNOSIS — J4531 Mild persistent asthma with (acute) exacerbation: Secondary | ICD-10-CM | POA: Diagnosis not present

## 2024-07-03 DIAGNOSIS — I480 Paroxysmal atrial fibrillation: Secondary | ICD-10-CM

## 2024-07-03 DIAGNOSIS — E785 Hyperlipidemia, unspecified: Secondary | ICD-10-CM | POA: Diagnosis not present

## 2024-07-03 DIAGNOSIS — I700 Atherosclerosis of aorta: Secondary | ICD-10-CM

## 2024-07-03 DIAGNOSIS — N1832 Chronic kidney disease, stage 3b: Secondary | ICD-10-CM

## 2024-07-03 DIAGNOSIS — I10 Essential (primary) hypertension: Secondary | ICD-10-CM | POA: Diagnosis not present

## 2024-07-03 DIAGNOSIS — I5032 Chronic diastolic (congestive) heart failure: Secondary | ICD-10-CM | POA: Diagnosis not present

## 2024-07-03 DIAGNOSIS — M8589 Other specified disorders of bone density and structure, multiple sites: Secondary | ICD-10-CM | POA: Diagnosis not present

## 2024-07-03 DIAGNOSIS — E871 Hypo-osmolality and hyponatremia: Secondary | ICD-10-CM

## 2024-07-03 DIAGNOSIS — J849 Interstitial pulmonary disease, unspecified: Secondary | ICD-10-CM

## 2024-07-03 NOTE — Assessment & Plan Note (Signed)
 Hypertension managed with losartan  100 mg and Toprol  37.5 mg, currently well-controlled. Stable, chronic.  Continue current medication.

## 2024-07-03 NOTE — Assessment & Plan Note (Signed)
Chronic, followed by pulmonary Advair 115 2 puffs twice daily  albuterol inhaler as needed Flutter valve therapy 2-3 times daily

## 2024-07-03 NOTE — Assessment & Plan Note (Signed)
Medical management with statin

## 2024-07-03 NOTE — Assessment & Plan Note (Signed)
 Resolved off hydrochlorothiazide .

## 2024-07-03 NOTE — Assessment & Plan Note (Signed)
 Asymptomatic. Needs more aggressive cholesterol treatment. LDL goal < 70  Followed by Dr. Anner Cardiology.

## 2024-07-03 NOTE — Assessment & Plan Note (Signed)
 Stable, chronic.  Euvolemic in office. Continue current medication.

## 2024-07-03 NOTE — Progress Notes (Signed)
 "   Patient ID: CHI WOODHAM, female    DOB: [birth year], 84 y.o.   MRN: [id number]  This visit was conducted in person.  BP 132/60   Pulse 81   Temp 99 F (37.2 C) (Oral)   Ht 5' 5.5 (1.664 m)   Wt 173 lb 4 oz (78.6 kg)   SpO2 95%   BMI 28.39 kg/m    CC:  Chief Complaint  Patient presents with   Annual Exam    Subjective:   HPI: Patty Shelton is a 84 y.o. female presenting on 07/03/2024 for  review of chronic health problems. He/She also has the following acute concerns today:  The patient saw a LPN or RN for medicare wellness visit. 10/2023  Prevention and wellness was reviewed in detail. Note reviewed and important notes copied below.  Paroxysmal A-fib followed by Dr. Anner.  On Toprol  for rate control.  Eliquis  5 mg twice daily for anticoagulation.  Hypertension:   Well controlled  on current meds. Losartan  BP Readings from Last 3 Encounters:  07/03/24 132/60  04/12/24 130/64  01/10/24 (!) 140/70  Using medication without problems or lightheadedness:  none Edema:none No chest pain Short of breath: yes, mild.. some with exertion... not progressing Average home BPs: Other issues:  Elevated Cholesterol: HX of CAD, goal LDL < 70 Lab Results  Component Value Date   CHOL 157 06/26/2024   HDL 52.20 06/26/2024   LDLCALC 90 06/26/2024   LDLDIRECT 180.6 04/04/2011   TRIG 76.0 06/26/2024   CHOLHDL 3 06/26/2024  Using medications without problems: Muscle aches:  Diet compliance: good, room for improvement. Exercise:  none Other complaints:  CKD  Interstitial lung disease/ bronchiectasis: Followed by pulmonary.    Rarely needing albuterol  prn.  Last OV 10/2023  Gout , no flares in the last year. Lab Results  Component Value Date   LABURIC 6.0 06/26/2024    Body mass index is 28.39 kg/m.    Wt Readings from Last 3 Encounters:  07/03/24 173 lb 4 oz (78.6 kg)  04/12/24 172 lb 6 oz (78.2 kg)  01/10/24 170 lb 4 oz (77.2 kg)  Body mass index is 28.39  kg/m.    Relevant past medical, surgical, family and social history reviewed and updated as indicated. Interim medical history since our last visit reviewed. Allergies and medications reviewed and updated. Outpatient Medications Prior to Visit  Medication Sig Dispense Refill   predniSONE  (STERAPRED UNI-PAK 48 TAB) 10 MG (48) TBPK tablet Take by mouth as directed.     acetaminophen  (TYLENOL ) 500 MG tablet Take 500 mg by mouth every 6 (six) hours as needed.     albuterol  (VENTOLIN  HFA) 108 (90 Base) MCG/ACT inhaler Inhale 2 puffs into the lungs every 4 (four) hours as needed for wheezing or shortness of breath. 8 g 10   apixaban  (ELIQUIS ) 5 MG TABS tablet Take 5 mg by mouth 2 (two) times daily.     benzonatate  (TESSALON  PERLES) 100 MG capsule Take 1 capsule (100 mg total) by mouth 3 (three) times daily as needed for cough. 20 capsule 0   Biotin  10 MG TABS Take 1 tablet by mouth daily.     cetirizine (ZYRTEC) 10 MG tablet Take 10 mg by mouth daily as needed for allergies.     Cholecalciferol  (VITAMIN D3) 50 MCG (2000 UT) TABS Take 1 tablet by mouth daily.     CRANBERRY PO Take 1 capsule by mouth daily.     diclofenac  sodium (VOLTAREN ) 1 %  GEL Apply 4 g topically 4 (four) times daily as needed.     famciclovir  (FAMVIR ) 250 MG tablet TAKE 1 TABLET DAILY 90 tablet 3   fluticasone  (FLONASE ) 50 MCG/ACT nasal spray Place 2 sprays into both nostrils as needed.      fluticasone -salmeterol (ADVAIR  HFA) 115-21 MCG/ACT inhaler USE 2 INHALATIONS TWICE A DAY 3 each 2   losartan  (COZAAR ) 100 MG tablet Take 1 tablet (100 mg total) by mouth daily. 90 tablet 3   meloxicam  (MOBIC ) 15 MG tablet Take 15 mg by mouth daily.     methocarbamol  (ROBAXIN ) 500 MG tablet Take 500 mg by mouth 3 (three) times daily.     metoprolol  succinate (TOPROL  XL) 25 MG 24 hr tablet Take 1.5 tablets (37.5 mg total) by mouth daily. 135 tablet 3   Multiple Minerals-Vitamins (CAL-MAG-ZINC-D PO) Take 1 tablet by mouth daily.      Respiratory Therapy Supplies (FLUTTER) DEVI 1 Device by Does not apply route as directed. 1 each 0   rosuvastatin  (CRESTOR ) 10 MG tablet TAKE 1 TABLET DAILY 90 tablet 3   vitamin B-12 (CYANOCOBALAMIN ) 1000 MCG tablet Take 1,000 mcg by mouth daily.     vitamin E  180 MG (400 UNITS) capsule Take 400 Units by mouth daily.     No facility-administered medications prior to visit.     Per HPI unless specifically indicated in ROS section below Review of Systems  Constitutional:  Negative for fatigue and fever.  HENT:  Negative for congestion.   Eyes:  Negative for pain.  Respiratory:  Negative for cough and shortness of breath.   Cardiovascular:  Negative for chest pain, palpitations and leg swelling.  Gastrointestinal:  Negative for abdominal pain and blood in stool.       GERD  Genitourinary:  Negative for dysuria and vaginal bleeding.  Musculoskeletal:  Negative for back pain.  Neurological:  Negative for syncope, light-headedness and headaches.  Psychiatric/Behavioral:  Negative for dysphoric mood.    Objective:  BP 132/60   Pulse 81   Temp 99 F (37.2 C) (Oral)   Ht 5' 5.5 (1.664 m)   Wt 173 lb 4 oz (78.6 kg)   SpO2 95%   BMI 28.39 kg/m   Wt Readings from Last 3 Encounters:  07/03/24 173 lb 4 oz (78.6 kg)  04/12/24 172 lb 6 oz (78.2 kg)  01/10/24 170 lb 4 oz (77.2 kg)      Physical Exam Constitutional:      General: She is not in acute distress.    Appearance: Normal appearance. She is well-developed. She is not ill-appearing or toxic-appearing.  HENT:     Head: Normocephalic.     Right Ear: Hearing, tympanic membrane, ear canal and external ear normal. Tympanic membrane is not erythematous, retracted or bulging.     Left Ear: Hearing, tympanic membrane, ear canal and external ear normal. Tympanic membrane is not erythematous, retracted or bulging.     Nose: No mucosal edema or rhinorrhea.     Right Sinus: No maxillary sinus tenderness or frontal sinus tenderness.      Left Sinus: No maxillary sinus tenderness or frontal sinus tenderness.     Mouth/Throat:     Pharynx: Uvula midline.  Eyes:     General: Lids are normal. Lids are everted, no foreign bodies appreciated.     Conjunctiva/sclera: Conjunctivae normal.     Pupils: Pupils are equal, round, and reactive to light.  Neck:     Thyroid : No thyroid  mass or  thyromegaly.     Vascular: No carotid bruit.     Trachea: Trachea normal.  Cardiovascular:     Rate and Rhythm: Normal rate and regular rhythm.     Pulses: Normal pulses.     Heart sounds: Normal heart sounds, S1 normal and S2 normal. No murmur heard.    No friction rub. No gallop.  Pulmonary:     Effort: Pulmonary effort is normal. No tachypnea or respiratory distress.     Breath sounds: Normal breath sounds. No decreased breath sounds, wheezing, rhonchi or rales.  Abdominal:     General: Bowel sounds are normal.     Palpations: Abdomen is soft.     Tenderness: There is no abdominal tenderness.  Musculoskeletal:     Cervical back: Normal range of motion and neck supple.  Skin:    General: Skin is warm and dry.     Findings: No rash.  Neurological:     Mental Status: She is alert.  Psychiatric:        Mood and Affect: Mood is not anxious or depressed.        Speech: Speech normal.        Behavior: Behavior normal. Behavior is cooperative.        Thought Content: Thought content normal.        Judgment: Judgment normal.       Results for orders placed or performed in visit on 06/26/24  Uric Acid   Collection Time: 06/26/24  8:21 AM  Result Value Ref Range   Uric Acid, Serum 6.0 2.4 - 7.0 mg/dL  Lipid panel   Collection Time: 06/26/24  8:21 AM  Result Value Ref Range   Cholesterol 157 28 - 200 mg/dL   Triglycerides 23.9 89.9 - 149.0 mg/dL   HDL 47.79 >60.99 mg/dL   VLDL 84.7 0.0 - 59.9 mg/dL   LDL Cholesterol 90 10 - 99 mg/dL   Total CHOL/HDL Ratio 3    NonHDL 104.86   Comprehensive metabolic panel   Collection Time:  06/26/24  8:21 AM  Result Value Ref Range   Sodium 135 135 - 145 mEq/L   Potassium 4.2 3.5 - 5.1 mEq/L   Chloride 101 96 - 112 mEq/L   CO2 27 19 - 32 mEq/L   Glucose, Bld 117 (H) 70 - 99 mg/dL   BUN 13 6 - 23 mg/dL   Creatinine, Ser 8.89 0.40 - 1.20 mg/dL   Total Bilirubin 0.5 0.2 - 1.2 mg/dL   Alkaline Phosphatase 60 39 - 117 U/L   AST 15 5 - 37 U/L   ALT 11 3 - 35 U/L   Total Protein 6.1 6.0 - 8.3 g/dL   Albumin 4.0 3.5 - 5.2 g/dL   GFR 53.71 (L) >39.99 mL/min   Calcium  8.7 8.4 - 10.5 mg/dL     COVID 19 screen:  No recent travel or known exposure to COVID19 The patient denies respiratory symptoms of COVID 19 at this time. The importance of social distancing was discussed today.   Assessment and Plan   The patient's preventative maintenance and recommended screening tests for an annual wellness exam were reviewed in full today. Brought up to date unless services declined.  Counselled on the importance of diet, exercise, and its role in overall health and mortality. The patient's FH and SH was reviewed, including their home life, tobacco status, and drug and alcohol status.   Vaccines: Consider COVID, shingles, tetanus, RSV at pharmacy.  Refused flu vaccine. Pap/DVE: not  indicated  Mammo: 07/2023 nml, repeat in 1 year Bone Density: 09/04/2019 stable osteopenia, repeat in 5 years.. due Colon: Not indicated Smoking Status: Non-smoker ETOH/ drug use: None/none  Hep C: Done  HIV screen:   none  Problem List Items Addressed This Visit     Asthma (Chronic)   Chronic, followed by pulmonary Advair  115 2 puffs twice daily  albuterol  inhaler as needed Flutter valve therapy 2-3 times daily      Relevant Medications   predniSONE  (STERAPRED UNI-PAK 48 TAB) 10 MG (48) TBPK tablet   Bronchiectasis without complication (HCC) (Chronic)   Followed by pulmonary.  Treated with flutter valve and steroid inhaler.      Chronic diastolic CHF (congestive heart failure) (HCC) (Chronic)    Stable, chronic.  Euvolemic in office. Continue current medication.       CKD (chronic kidney disease) stage 3, GFR 30-59 ml/min (HCC) (Chronic)    Chornic likely due to HTN, slight dip on labs today.  GFR baseline 43-55   On ARB.      Coronary atherosclerosis (Chronic)   Asymptomatic. Needs more aggressive cholesterol treatment. LDL goal < 70  Followed by Dr. Anner Cardiology.      Hyperlipidemia with target LDL less than 100 (Chronic)   Stable, chronic.  LDL  not at goal < 70  Work on low cholesterol heart healthy diet and regular activity as tolerated.  Discussed increase of crestor  to 20 mg daily as she has no SE and is overall healthy for her age.  She will discuss with Cardiology at upcoming appt on 07/05/2024  On Crestor  10 mg p.o. daily  History of CAD, goal LDL less than 70      Hyponatremia   Resolved off hydrochlorothiazide .      ILD (interstitial lung disease) (HCC) (Chronic)   Chronic, followed by pulmonary Mild on CT 2021      Osteopenia   Relevant Orders   DG Bone Density   Paroxysmal atrial fibrillation (HCC) (Chronic)    Chronic, reviewed Cardiology recent note  Continue Toprol  37.5 mg for rate control, and Eliquis  5 mg twice daily       Primary hypertension - Primary (Chronic)   Hypertension managed with losartan  100 mg and Toprol  37.5 mg, currently well-controlled. Stable, chronic.  Continue current medication.        Thoracic aortic atherosclerosis (HCC)-seen on CT (Chronic)   Medical management with statin      Other Visit Diagnoses       Other specified disorders of bone density and structure, multiple sites       Relevant Orders   DG Bone Density           Greig Ring, MD   "

## 2024-07-03 NOTE — Assessment & Plan Note (Addendum)
 Stable, chronic.  LDL  not at goal < 70  Work on low cholesterol heart healthy diet and regular activity as tolerated.  Discussed increase of crestor  to 20 mg daily as she has no SE and is overall healthy for her age.  She will discuss with Cardiology at upcoming appt on 07/05/2024  On Crestor  10 mg p.o. daily  History of CAD, goal LDL less than 70

## 2024-07-03 NOTE — Assessment & Plan Note (Signed)
Followed by pulmonary.  Treated with flutter valve and steroid inhaler.

## 2024-07-03 NOTE — Assessment & Plan Note (Signed)
 Chronic, reviewed Cardiology recent note  Continue Toprol  37.5 mg for rate control, and Eliquis  5 mg twice daily

## 2024-07-03 NOTE — Patient Instructions (Signed)
Please call the location of your choice from the menu below to schedule your Mammogram and/or Bone Density appointment.   ? ? ? ?[facility] ? ?[facility]   ?Phone:  [phone]   ?[address]                                                                            ?[place], [place] [address]                                            ?Services: 3D Mammogram and Bone Density ? ? ? ?

## 2024-07-03 NOTE — Assessment & Plan Note (Addendum)
 Chronic, followed by pulmonary Mild on CT 2021

## 2024-07-03 NOTE — Assessment & Plan Note (Addendum)
"   Chornic likely due to HTN, slight dip on labs today.  GFR baseline 43-55   On ARB. "

## 2024-07-04 ENCOUNTER — Ambulatory Visit: Payer: Medicare Other | Admitting: Family Medicine

## 2024-07-04 NOTE — Progress Notes (Unsigned)
 " Cardiology Office Note   Date:  07/05/2024  ID:  Quamesha, Mullet 03-15-1941, MRN [id number] PCP: Avelina Greig BRAVO, MD  [facility] Providers Cardiologist:  Alm Clay, MD   History of Present Illness Patty Shelton is a 84 y.o. female with a past medical history of hypertension, hyperlipidemia, longstanding interstitial lung disease/bronchiectasis, asthma, dysphagia, GERD, hearing loss, osteopenia, pAfib, CKD stage III who presents for follow-up of Afib.   Echocardiogram in December 2022 showed EF of 55 to 60%, no wall motion abnormality, grade 1 diastolic dysfunction, mild LA dilation, normal RV size and function.  Coronary CTA in November 2022 showed calcium  score of 20 with minimal proximal dominant RCA calcified plaque at 0 to 24% with no plaque noted in the LAD and nondominant left circumflex.     The patient presented to [facility] 08/01/2023 with chest pain.  She had previously been diagnosed with influenza A.  Troponin was negative x 2.  EKG showed normal sinus rhythm, no significant change.  Chest x-ray no focal consolidation.  She was seen in clinic 08/17/2023 reporting continual chest discomfort for several weeks.  She was started on Toprol  12.5 mg daily to help with palpitations.  Carotid duplex was ordered for carotid bruit.  She was seen back in clinic 09/28/2023 and was overall doing well.   The patient went to the ER 10/13/23 for dizziness and fall. She was watering her plants when she tripped on the steps.she had no LOC. She had no significant injuries. She refised CT scan of the head and neck. She was found to be in new onset Afib with rates up to 102bpm. Sodium was 129 and she was given chloride tablet. She was admitted but hospital did not feel it was Afib so no a/c was started. HS troponin normal. She was unable to get a bed so she was discharged home. While in afib she did not feel heart racing or palpitations. She was sent home on home dose of metoprolol .   The patient seen  10/20/23 and the patient was in NSR. EKGs confirmed Afib and she was started on Eliquis .orthostatics were negative. Toprol  was increased to 37.5mg  daily. Heart monitor showed NSR, Avg HR 66bpm, 1% Afib burden, rare PAC and PVCs. Echo showed LVEF 60-65%, G1DD, mild MR. MPI showed no ischemia or infarction.  She saw Dr. Clay 12/12/23 and was stable from a cardiac perspective.   Today, the patient is overall doing well. She has rare chest discomfort when she takes a deep breath. Breathing is overall good. She denies lower leg edema, lightheadedness or dizziness. She has minor palpitations. She does no formal walking or activity.   Studies Reviewed EKG Interpretation Date/Time:  Friday July 05 2024 10:12:43 EST Ventricular Rate:  80 PR Interval:  154 QRS Duration:  86 QT Interval:  388 QTC Calculation: 447 R Axis:   -40  Text Interpretation: Normal sinus rhythm Left axis deviation When compared with ECG of 28-Dec-2023 09:35, No significant change was found Confirmed by Franchester, Kaloni Bisaillon ([id number]) on 07/05/2024 10:16:05 AM    Heart monitor 12/2023   14-day Zio patch monitor (May 2025)   Predominant underlying rhythm was sinus rhythm with a rate range of 50 to 101 bpm and average of 66 bpm.   Rare PACs and PVCs noted with rare couplets and triplets.  Brief episodes of ventricular bigeminy or trigeminy noted.   1% A-fib burden with a total of 22 episodes, 6 of which lasted longer than 6 minutes.  The longest run of 1 hour and 22. == Heart rate range of A-fib was 58 to 145 bpm and an average of 90 bpm.  MPI 10/2023    Findings are consistent with no ischemia and no infarction. The study is intermediate risk due to mildly reduced myocardial blood flow reserve which may represent microvascular dysfunction, or possible balanced ischemia due to multivessel CAD. There is no TID, normal augmentation of EF with stress and normal perfusion with mild coronary calcifications.   LV perfusion is normal. There is no  evidence of ischemia. There is no evidence of infarction.   Rest left ventricular function is normal. Rest EF: 60%. Stress left ventricular function is normal. Stress EF: 66%. End diastolic cavity size is normal. End systolic cavity size is normal. No evidence of transient ischemic dilation (TID) noted.   Myocardial blood flow was computed to be 1.79ml/g/min at rest and 2.06ml/g/min at stress. Global myocardial blood flow reserve was 1.86 and was mildly abnormal.   Coronary calcium  was present on the attenuation correction CT images. Mild coronary calcifications were present. Coronary calcifications were present in the left anterior descending artery distribution(s).   Electronically signed by: Soyla DELENA Merck, MD  Echo 05/2021 1. Left ventricular ejection fraction, by estimation, is 55 to 60%. The  left ventricle has normal function. The left ventricle has no regional  wall motion abnormalities. Left ventricular diastolic parameters are  consistent with Grade I diastolic  dysfunction (impaired relaxation).   2. Right ventricular systolic function is normal. The right ventricular  size is normal.   3. Left atrial size was mildly dilated.   4. The mitral valve is normal in structure. No evidence of mitral valve  regurgitation.   5. The aortic valve is tricuspid. Aortic valve regurgitation is not  visualized.   6. The inferior vena cava is normal in size with greater than 50%  respiratory variability, suggesting right atrial pressure of 3 mmHg.    Cardiac CTA 05/2021   IMPRESSION: 1. Coronary calcium  score of 26.9. This was 28th percentile for age and sex matched control.   2. Normal coronary origin with right dominance.   3. Calcified plaque causing minimal proximal RCA disease.   4. CAD-RADS 1. Minimal non-obstructive CAD (0-24%). Consider non-atherosclerotic causes of chest pain. Consider preventive therapy and risk factor modification.     Physical Exam VS:  BP (!) 140/60 (BP  Location: Left Arm, Patient Position: Sitting, Cuff Size: Normal)   Pulse 80 Comment: 82 oximeter  Ht 5' 6 (1.676 m)   Wt 175 lb 12.8 oz (79.7 kg)   SpO2 97%   BMI 28.37 kg/m        Wt Readings from Last 3 Encounters:  07/05/24 175 lb 12.8 oz (79.7 kg)  07/03/24 173 lb 4 oz (78.6 kg)  04/12/24 172 lb 6 oz (78.2 kg)    GEN: Well nourished, well developed in no acute distress NECK: No JVD; No carotid bruits CARDIAC: RRR, no murmurs, rubs, gallops RESPIRATORY:  Clear to auscultation without rales, wheezing or rhonchi  ABDOMEN: Soft, non-tender, non-distended EXTREMITIES:  No edema; No deformity   ASSESSMENT AND PLAN  Pafib Patient is in NSR. Heart monitor last year showed less than 1% Afib burden. Continue Eliquis  5mg  BID. She denies bleeding issues. CBC today.   HTN BP is OK. Continue losartan  100mg  daily, Toprol  37.5mg  daily.   Nonobstructive CAD Cardiac CTA in 2022 showed minimal nonobstructive CAD.  She reports rare chest discomfort when she takes  a deep breath. No ASA given Eliquis . Continue statin therapy.   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 Continue statin therapy.   HLD LDL 90, goal<70. I will increase Crestor  20mg  daily.     Dispo: follow-up in 1 year  Signed, Malini Flemings VEAR Fishman, PA-C   "

## 2024-07-05 ENCOUNTER — Encounter: Payer: Self-pay | Admitting: Medical

## 2024-07-05 ENCOUNTER — Ambulatory Visit: Attending: Medical | Admitting: Medical

## 2024-07-05 VITALS — BP 140/60 | HR 80 | Ht 66.0 in | Wt 175.8 lb

## 2024-07-05 DIAGNOSIS — I10 Essential (primary) hypertension: Secondary | ICD-10-CM | POA: Diagnosis present

## 2024-07-05 DIAGNOSIS — I2510 Atherosclerotic heart disease of native coronary artery without angina pectoris: Secondary | ICD-10-CM | POA: Diagnosis not present

## 2024-07-05 DIAGNOSIS — I480 Paroxysmal atrial fibrillation: Secondary | ICD-10-CM | POA: Insufficient documentation

## 2024-07-05 DIAGNOSIS — I6529 Occlusion and stenosis of unspecified carotid artery: Secondary | ICD-10-CM | POA: Insufficient documentation

## 2024-07-05 DIAGNOSIS — Z79899 Other long term (current) drug therapy: Secondary | ICD-10-CM | POA: Diagnosis present

## 2024-07-05 DIAGNOSIS — E782 Mixed hyperlipidemia: Secondary | ICD-10-CM | POA: Insufficient documentation

## 2024-07-05 MED ORDER — ROSUVASTATIN CALCIUM 20 MG PO TABS: 20.0000 mg | ORAL_TABLET | Freq: Every day | ORAL | 3 refills | Status: AC

## 2024-07-05 NOTE — Patient Instructions (Signed)
 Medication Instructions:   Your physician recommends the following medication changes.  INCREASE: Rosuvastatin  20 mg once daily   *If you need a refill on your cardiac medications before your next appointment, please call your pharmacy*  Lab Work:  Your provider would like for you to have following labs drawn today CBC.    If you have labs (blood work) drawn today and your tests are completely normal, you will receive your results only by:  MyChart Message (if you have MyChart) OR  A paper copy in the mail If you have any lab test that is abnormal or we need to change your treatment, we will call you to review the results.  Testing/Procedures:  None ordered at this time   Referrals:  None ordered at this time   Follow-Up:  At [facility], you and your health needs are our priority.  As part of our continuing mission to provide you with exceptional heart care, our providers are all part of one team.  This team includes your primary Cardiologist (physician) and Advanced Practice Providers or APPs (Physician Assistants and Nurse Practitioners) who all work together to provide you with the care you need, when you need it.  Your next appointment:   1 year(s)  Provider:    You may see Alm Clay, MD or one of the following Advanced Practice Providers on your designated Care Team:   Lonni Meager, NP Lesley Maffucci, PA-C Bernardino Bring, PA-C Cadence Meadow, PA-C Tylene Lunch, NP Barnie Hila, NP    We recommend signing up for the patient portal called MyChart.  Sign up information is provided on this After Visit Summary.  MyChart is used to connect with patients for Virtual Visits (Telemedicine).  Patients are able to view lab/test results, encounter notes, upcoming appointments, etc.  Non-urgent messages can be sent to your provider as well.   To learn more about what you can do with MyChart, go to [email or web address].

## 2024-07-05 NOTE — Progress Notes (Deleted)
 " Cardiology Office Note   Date:  07/05/2024  ID:  Patty Shelton, File 03-Nov-1940, MRN [id number] PCP: Avelina Greig BRAVO, MD  [facility] Providers Cardiologist:  Alm Clay, MD   History of Present Illness Patty Shelton is a 84 y.o. female with a past medical history of hypertension, hyperlipidemia, longstanding interstitial lung disease/bronchiectasis, asthma, dysphagia, GERD, hearing loss, osteopenia, CKD stage III who presents for ER follow-up.   Echocardiogram in December 2022 showed EF of 55 to 60%, no wall motion abnormality, grade 1 diastolic dysfunction, mild LA dilation, normal RV size and function.  Coronary CTA in November 2022 showed calcium  score of 20 with minimal proximal dominant RCA calcified plaque at 0 to 24% with no plaque noted in the LAD and nondominant left circumflex.     The patient presented to [facility] 08/01/2023 with chest pain.  She had previously been diagnosed with influenza A.  Troponin was negative x 2.  EKG showed normal sinus rhythm, no significant change.  Chest x-ray no focal consolidation.  She was seen in clinic 08/17/2023 reporting continual chest discomfort for several weeks.  She was started on Toprol  12.5 mg daily to help with palpitations.  Carotid duplex was ordered for carotid bruit.  She was seen back in clinic 09/28/2023 and was overall doing well.   The patient went to the ER 10/13/23 for dizziness and fall. She was watering her plants when she tripped on the steps.she had no LOC. She had no significant injuries. She refised CT scan of the head and neck. She was found to be in new onset Afib with rates up to 102bpm. Sodium was 129 and she was given chloride tablet. She was admitted but hospital did not feel it was Afib so no a/c was started. HS troponin normal. She was unable to get a bed so she was discharged home. While in afib she did not feel heart racing or palpitations. She was sent home on home dose of metoprolol .   The patient seen 10/20/23 and the  patient was in NSR. EKGs confirmed Afib and she was started on Eliquis .orthostatics were negative. Toprol  was increased to 37.5mg  daily. Heart monitor showed NSR, Avg HR 66bpm, 1% Afib burden, rare PAC and PVCs. Echo showed LVEF 60-65%, G1DD, mild MR. MPI showed no ischemia or infarction.  She saw Dr. Clay 12/12/23 and was stable from a cardiac perspective.   Today, the patient is overall doing well. She has rare chest discomfort when she takes a deep breath. Breathing is good. Minimal lowe rleg edema. No lightheadedness or dizziness. She has minor palpitations. She does not formal walking or activity.   Cestor 20mg  daily.   Studies Reviewed EKG Interpretation Date/Time:  Friday July 05 2024 10:12:43 EST Ventricular Rate:  80 PR Interval:  154 QRS Duration:  86 QT Interval:  388 QTC Calculation: 447 R Axis:   -40  Text Interpretation: Normal sinus rhythm Left axis deviation When compared with ECG of 28-Dec-2023 09:35, No significant change was found Confirmed by Franchester, Cadence ([id number]) on 07/05/2024 10:16:05 AM    *** Risk Assessment/Calculations {Does this patient have ATRIAL FIBRILLATION?:[phone]} The patient's 1st BP is elevated (>139/89)*** Repeat BP and {Click to enter a 2nd BP Refresh Note  :1}       Physical Exam VS:  BP (!) 140/60 (BP Location: Left Arm, Patient Position: Sitting, Cuff Size: Normal)   Pulse 80 Comment: 82 oximeter  Ht 5' 6 (1.676 m)   Wt 175 lb 12.8  oz (79.7 kg)   SpO2 97%   BMI 28.37 kg/m        Wt Readings from Last 3 Encounters:  07/05/24 175 lb 12.8 oz (79.7 kg)  07/03/24 173 lb 4 oz (78.6 kg)  04/12/24 172 lb 6 oz (78.2 kg)    GEN: Well nourished, well developed in no acute distress NECK: No JVD; No carotid bruits CARDIAC: RRR, no murmurs, rubs, gallops RESPIRATORY:  Clear to auscultation without rales, wheezing or rhonchi  ABDOMEN: Soft, non-tender, non-distended EXTREMITIES:  No edema; No deformity   ASSESSMENT AND  PLAN  Pafib  HTN  Nonobstructive CAD  Carotid artery diease Carotid   HLD LDL 90, goal<70. I will increase Crestor  20mg  daily.         Dispo: follow-up in 1 year  Signed, Mac DELENA Blanch, RN   "

## 2024-07-06 LAB — CBC
Hematocrit: 38.8 % (ref 34.0–46.6)
Hemoglobin: 13 g/dL (ref 11.1–15.9)
MCH: 31.9 pg (ref 26.6–33.0)
MCHC: 33.5 g/dL (ref 31.5–35.7)
MCV: 95 fL (ref 79–97)
Platelets: 280 10*3/uL (ref 150–450)
RBC: 4.07 x10E6/uL (ref 3.77–5.28)
RDW: 12 % (ref 11.7–15.4)
WBC: 8.5 10*3/uL (ref 3.4–10.8)

## 2024-07-07 ENCOUNTER — Ambulatory Visit: Payer: Self-pay | Admitting: Medical

## 2024-08-06 ENCOUNTER — Other Ambulatory Visit

## 2024-10-15 ENCOUNTER — Ambulatory Visit

## 2024-12-10 ENCOUNTER — Encounter: Admitting: Dermatology

## 2025-06-27 ENCOUNTER — Other Ambulatory Visit

## 2025-07-04 ENCOUNTER — Encounter: Admitting: Family Medicine
